# Patient Record
Sex: Female | Born: 1935
Health system: Southern US, Community
[De-identification: ages and names within clinical notes are randomized; demographics above are authoritative.]

## PROBLEM LIST (undated history)

## (undated) DIAGNOSIS — E7800 Pure hypercholesterolemia, unspecified: Secondary | ICD-10-CM

## (undated) DIAGNOSIS — I10 Essential (primary) hypertension: Secondary | ICD-10-CM

## (undated) DIAGNOSIS — E119 Type 2 diabetes mellitus without complications: Secondary | ICD-10-CM

## (undated) DIAGNOSIS — E079 Disorder of thyroid, unspecified: Secondary | ICD-10-CM

## (undated) HISTORY — DX: Disorder of thyroid, unspecified: E07.9

## (undated) HISTORY — PX: SMALL INTESTINE SURGERY: SHX150

## (undated) HISTORY — PX: ABDOMINAL HYSTERECTOMY: SHX81

## (undated) HISTORY — PX: KNEE SURGERY: SHX244

---

## 1998-08-03 ENCOUNTER — Ambulatory Visit (HOSPITAL_COMMUNITY): Admission: RE | Admit: 1998-08-03 | Discharge: 1998-08-03 | Payer: Self-pay | Admitting: Nephrology

## 1998-08-03 ENCOUNTER — Encounter: Payer: Self-pay | Admitting: Nephrology

## 1999-12-07 ENCOUNTER — Encounter: Admission: RE | Admit: 1999-12-07 | Discharge: 1999-12-07 | Payer: Self-pay | Admitting: Gynecology

## 1999-12-07 ENCOUNTER — Encounter: Payer: Self-pay | Admitting: Gynecology

## 2000-06-26 ENCOUNTER — Other Ambulatory Visit: Admission: RE | Admit: 2000-06-26 | Discharge: 2000-06-26 | Payer: Self-pay | Admitting: Gynecology

## 2000-12-01 ENCOUNTER — Encounter: Payer: Self-pay | Admitting: *Deleted

## 2000-12-01 ENCOUNTER — Encounter: Admission: RE | Admit: 2000-12-01 | Discharge: 2000-12-01 | Payer: Self-pay | Admitting: *Deleted

## 2000-12-13 ENCOUNTER — Encounter: Payer: Self-pay | Admitting: Gynecology

## 2000-12-13 ENCOUNTER — Encounter: Admission: RE | Admit: 2000-12-13 | Discharge: 2000-12-13 | Payer: Self-pay | Admitting: Gynecology

## 2001-01-19 ENCOUNTER — Encounter (INDEPENDENT_AMBULATORY_CARE_PROVIDER_SITE_OTHER): Payer: Self-pay | Admitting: Specialist

## 2001-01-19 ENCOUNTER — Ambulatory Visit (HOSPITAL_COMMUNITY): Admission: RE | Admit: 2001-01-19 | Discharge: 2001-01-19 | Payer: Self-pay | Admitting: *Deleted

## 2001-12-24 ENCOUNTER — Encounter: Admission: RE | Admit: 2001-12-24 | Discharge: 2001-12-24 | Payer: Self-pay | Admitting: Gynecology

## 2001-12-24 ENCOUNTER — Encounter: Payer: Self-pay | Admitting: Gynecology

## 2002-10-25 ENCOUNTER — Ambulatory Visit (HOSPITAL_COMMUNITY): Admission: RE | Admit: 2002-10-25 | Discharge: 2002-10-25 | Payer: Self-pay | Admitting: Gastroenterology

## 2003-01-09 ENCOUNTER — Encounter: Payer: Self-pay | Admitting: Family Medicine

## 2003-01-09 ENCOUNTER — Encounter: Admission: RE | Admit: 2003-01-09 | Discharge: 2003-01-09 | Payer: Self-pay | Admitting: Family Medicine

## 2003-11-17 ENCOUNTER — Emergency Department (HOSPITAL_COMMUNITY): Admission: EM | Admit: 2003-11-17 | Discharge: 2003-11-17 | Payer: Self-pay | Admitting: *Deleted

## 2004-03-01 ENCOUNTER — Encounter: Admission: RE | Admit: 2004-03-01 | Discharge: 2004-03-01 | Payer: Self-pay | Admitting: Gynecology

## 2004-08-06 ENCOUNTER — Other Ambulatory Visit: Admission: RE | Admit: 2004-08-06 | Discharge: 2004-08-06 | Payer: Self-pay | Admitting: Gynecology

## 2005-02-07 ENCOUNTER — Other Ambulatory Visit: Admission: RE | Admit: 2005-02-07 | Discharge: 2005-02-07 | Payer: Self-pay | Admitting: Gynecology

## 2005-04-15 ENCOUNTER — Encounter: Admission: RE | Admit: 2005-04-15 | Discharge: 2005-04-15 | Payer: Self-pay | Admitting: Gynecology

## 2005-08-18 ENCOUNTER — Other Ambulatory Visit: Admission: RE | Admit: 2005-08-18 | Discharge: 2005-08-18 | Payer: Self-pay | Admitting: Obstetrics and Gynecology

## 2006-04-26 ENCOUNTER — Encounter: Admission: RE | Admit: 2006-04-26 | Discharge: 2006-04-26 | Payer: Self-pay | Admitting: Gynecology

## 2007-05-17 ENCOUNTER — Encounter: Admission: RE | Admit: 2007-05-17 | Discharge: 2007-05-17 | Payer: Self-pay | Admitting: Gynecology

## 2007-10-31 ENCOUNTER — Inpatient Hospital Stay (HOSPITAL_COMMUNITY): Admission: EM | Admit: 2007-10-31 | Discharge: 2007-11-01 | Payer: Self-pay | Admitting: Emergency Medicine

## 2007-10-31 ENCOUNTER — Ambulatory Visit: Payer: Self-pay | Admitting: Cardiology

## 2007-11-01 ENCOUNTER — Ambulatory Visit: Payer: Self-pay

## 2007-11-08 ENCOUNTER — Ambulatory Visit: Payer: Self-pay | Admitting: Cardiovascular Disease

## 2007-11-08 LAB — CONVERTED CEMR LAB
BUN: 10 mg/dL (ref 6–23)
CO2: 29 meq/L (ref 19–32)
Calcium: 9.3 mg/dL (ref 8.4–10.5)
Creatinine, Ser: 0.9 mg/dL (ref 0.4–1.2)
GFR calc Af Amer: 80 mL/min
GFR calc non Af Amer: 66 mL/min

## 2008-05-11 ENCOUNTER — Encounter: Admission: RE | Admit: 2008-05-11 | Discharge: 2008-05-11 | Payer: Self-pay | Admitting: Family Medicine

## 2008-05-28 ENCOUNTER — Encounter: Admission: RE | Admit: 2008-05-28 | Discharge: 2008-05-28 | Payer: Self-pay | Admitting: Gynecology

## 2009-06-30 ENCOUNTER — Encounter
Admission: RE | Admit: 2009-06-30 | Discharge: 2009-06-30 | Payer: Self-pay | Source: Home / Self Care | Admitting: Gynecology

## 2010-06-03 ENCOUNTER — Emergency Department (HOSPITAL_COMMUNITY)
Admission: EM | Admit: 2010-06-03 | Discharge: 2010-06-03 | Payer: Self-pay | Source: Home / Self Care | Admitting: Emergency Medicine

## 2010-06-03 LAB — BASIC METABOLIC PANEL
BUN: 7 mg/dL (ref 6–23)
Calcium: 10 mg/dL (ref 8.4–10.5)
Chloride: 105 mEq/L (ref 96–112)
Creatinine, Ser: 0.92 mg/dL (ref 0.4–1.2)
Glucose, Bld: 139 mg/dL — ABNORMAL HIGH (ref 70–99)
Potassium: 4 mEq/L (ref 3.5–5.1)
Sodium: 140 mEq/L (ref 135–145)

## 2010-06-03 LAB — CBC: Platelets: 199 10*3/uL (ref 150–400)

## 2010-06-03 LAB — DIFFERENTIAL
Basophils Absolute: 0 10*3/uL (ref 0.0–0.1)
Eosinophils Absolute: 0.2 10*3/uL (ref 0.0–0.7)
Lymphocytes Relative: 11 % — ABNORMAL LOW (ref 12–46)
Lymphs Abs: 0.8 10*3/uL (ref 0.7–4.0)
Neutrophils Relative %: 80 % — ABNORMAL HIGH (ref 43–77)

## 2010-06-17 ENCOUNTER — Other Ambulatory Visit: Payer: Self-pay | Admitting: Gynecology

## 2010-06-17 DIAGNOSIS — Z1239 Encounter for other screening for malignant neoplasm of breast: Secondary | ICD-10-CM

## 2010-07-06 ENCOUNTER — Ambulatory Visit
Admission: RE | Admit: 2010-07-06 | Discharge: 2010-07-06 | Disposition: A | Discharge status: Home / Self Care | Payer: MEDICARE | Source: Ambulatory Visit | Attending: Gynecology | Admitting: Gynecology

## 2010-07-06 DIAGNOSIS — Z1239 Encounter for other screening for malignant neoplasm of breast: Secondary | ICD-10-CM

## 2010-09-21 NOTE — Discharge Summary (Signed)
Gloria Murphy, Gloria Murphy               ACCOUNT NO.:  [id number]   MEDICAL RECORD NO.:  [id number]          PATIENT TYPE:  INP   LOCATION:  4741                         FACILITY:  [facility]   PHYSICIAN:  Veverly Fells. Excell Seltzer, MD  DATE OF BIRTH:  1935-08-10   DATE OF ADMISSION:  10/31/2007  DATE OF DISCHARGE:  11/01/2007                               DISCHARGE SUMMARY   PRIMARY CARDIOLOGIST:  Rollene Rotunda, MD, [facility]   PRIMARY CARE Evah Rashid:  Renaye Rakers, MD   DISCHARGE DIAGNOSIS:  Chest pain.   SECONDARY DIAGNOSES:  1. Hypertension x15 years.  2. Hyperlipidemia x3 years.  3. Diabetes mellitus x3 years.  4. Iron-deficiency anemia.  5. Gastroesophageal reflux disease.  6. Obesity.   ALLERGIES:  No known drug allergies.   PROCEDURES:  None.   HISTORY OF PRESENT ILLNESS:  This is a 75 year old African American  female with no prior cardiac history, although with multiple risk  factors and family history.  She was in her usual state of health until  October 31, 2007 when she had sudden onset chest discomfort occurring under  her right breast unrelieved with Tylenol.  She eventually presented to  the [facility] ED where her pain slowly eased off after receiving  aspirin, sublingual nitroglycerin, morphine, and nitroglycerin infusion.  Symptoms were felt to be somewhat atypical and the patient was admitted  for a rule out.   HOSPITAL COURSE:  Cardiac markers have remained negative x3.  ECG has  shown no acute changes.  Ms. Luciana Axe has had no additional chest  discomfort.  We will plan to discharge her home today and we have  arranged for a followup adenosine Myoview in our office this afternoon  at 12 noon.   DISCHARGE LABORATORY DATA:  Hemoglobin 12.1, hematocrit 34.7, WBC 5.7,  platelets 172, MCV 87.8.  Sodium 139, potassium 3.6, chloride 104, CO2  28, BUN 9, creatinine 1.05, glucose 160.  Total bilirubin 0.9, alkaline  phosphatase 70, AST 26, ALT 10, lipase 23, albumin 3.5.  Cardiac  markers  negative x3.  Total cholesterol 112, triglycerides 51, HDL 42, LDL 60,  calcium 9.2, hemoglobin A1c 5.8.  TSH 2.800.  Fecal occult blood was  negative.   DISPOSITION:  The patient is being discharged home today in good  condition.   FOLLOWUP PLANS AND APPOINTMENTS:  As noted above, she has a adenosine  Myoview scheduled for this afternoon at 12 noon.  If this is negative,  we would simply recommend primary care followup with Dr. Parke Simmers.  Otherwise, we will arrange cardiology followup with Dr. Antoine Poche.   DISCHARGE MEDICATIONS:  1. Aspirin 81 mg daily.  2. Benicar hydrochlorothiazide 40/25 mg daily (new dose).  3. K-Dur 20 mEq daily.  4. Lipitor 20 mg daily.  5. Janumet 50/500 mg b.i.d.  6. Caltrate Plus D one daily.  7. Iron daily.  8. Nitroglycerin 0.4 mg sublingual p.r.n. chest pain.   Outstanding lab studies none.  The patient is scheduled for Myoview as  above and should have followup BMET in 1 week for hypokalemia with Dr.  Parke Simmers.  Duration of discharge  encounter 45 minutes including physician  time.      Nicolasa Ducking, ANP      Veverly Fells. Excell Seltzer, MD  Electronically Signed    CB/MEDQ  D:  11/01/2007  T:  11/02/2007  Job:  [id number]   cc:   Renaye Rakers, M.D.

## 2010-09-21 NOTE — H&P (Signed)
[name], Murphy NO.:  [id number]   MEDICAL RECORD NO.:  [id number]          PATIENT TYPE:  INP   LOCATION:  4741                         FACILITY:  [facility]   PHYSICIAN:  Rollene Rotunda, MD, FACCDATE OF BIRTH:  April 16, 1936   DATE OF ADMISSION:  10/31/2007  DATE OF DISCHARGE:                              HISTORY & PHYSICAL   PRIMARY CARE PHYSICIAN:  Renaye Rakers, MD.   PRIMARY CARDIOLOGIST:  Rollene Rotunda, MD.   CHIEF COMPLAINT:  Chest pain.   HISTORY OF PRESENT ILLNESS:  Gloria Murphy is a 75 year old African American  female with no previous history of coronary artery disease.  She had  sudden onset of right-sided chest pain that started in the area of her  chest under her right breast.  It started with minimal activity at  approximately 8 a.m. today.  Initially, it was 10/10.  She describes it  as a pressure and took Tylenol.  When her symptoms did not resolve, she  came to the emergency room at about 11 a.m.  She was given aspirin 81 mg  x4 as well as sublingual nitroglycerin x3 and 2 mg of Morphine.  She was  started on a nitroglycerin drip at 5 mcg per minute.  She describes her  pain as a 2 or 3/10 at this time.  She feels the nitroglycerin and the  aspirin did help as well as Morphine.  She has never had any symptoms  like this before.  She feels that she stays fairly active around the  house and doing yard work and never gets any chest pain.  She has some  orthopedic issues, but up until March was doing water aerobics also  without symptoms.  The pain radiated around to her back and was  associated with nausea, but she denies shortness of breath or  diaphoresis.  She also feels that she has had some dyspnea on exertion  recently that is new.  Currently, although she still does complain of  some minor pain, she is resting comfortably.   PAST MEDICAL HISTORY:  1. Diabetes for 3 years.  2. Hypertension for 15 years.  3. Hyperlipidemia for greater than 3  years.   FAMILY HISTORY:  1. Premature coronary artery disease.  2. History of iron deficiency anemia.  3. History of gastroesophageal reflux disease.  4. Osteoarthritis.   PAST SURGICAL HISTORY:  She has status post partial hysterectomy as well  as colonoscopy and EGD x2.   ALLERGIES:  No known drug allergies.   CURRENT MEDICATIONS:  1. Benicar HCT 20/12.5 daily.  2. Lipitor 10 mg a day.  3. Janumet 50/500, 1/2 tab b.i.d.  4. Caltrate plus D and OTC iron daily.   SOCIAL HISTORY:  She lives in [place] alone and is retired from  [employer].  She has no history of alcohol, tobacco, or drug abuse.  She  feels that she eats a pretty heart-healthy diet.  She tries to be  compliant with a diabetic diet as well.   FAMILY HISTORY:  Her mother died in her 20s of diabetes, but was  diagnosed with heart disease her 92s.  Her father died at age 48 of CVA,  but no history of coronary artery disease.  She has 1 of 5 siblings that  had a stroke, but none of them have heart disease.   REVIEW OF SYSTEMS:  She has had some sweats as well as night sweats, but  no fevers or chills.  The chest pain is described above.  The dyspnea on  exertion that she feels is a little worse recently.  She feels that she  has some occasional issues with anxiety.  She has had pain in her left  shoulder from a frozen shoulder, which has gotten better with steroid  injections and physical therapy and she also has some other arthralgias  that she feels are from her arthritis.  She had nausea today and feels  that she has occasional reflux symptoms, but says that they never get  bad enough to where she has to take any medication.  She denies  hematemesis, hemoptysis, or melena.  Full 14-point review of systems is  otherwise negative.   PHYSICAL EXAMINATION:  VITAL SIGNS:  Temperature is 97.9; blood pressure  initially 176/86, now 130/80; pulse 87; respiratory rate 16; O2  saturation 97% on room air.  GENERAL:   She is a well-developed [place] female in no acute  distress.  HEENT:  Normal.  NECK:  There is no lymphadenopathy, thyromegaly, bruits, or JVD noted.  CV:  Her heart is regular in rate and rhythm with S1 and S2 and no  significant murmur, rub, or gallop is noted.  Distal pulses are intact  in all 4 extremities and no femoral bruits are appropriated.  LUNGS:  Clear to auscultation bilaterally without wheezing or crackles.  SKIN:  No rashes or lesions are noted.  ABDOMEN:  Soft and nontender with active bowel sounds and no  hepatosplenomegaly.  EXTREMITIES:  There is no cyanosis, clubbing, or edema noted.  MUSCULOSKELETAL:  There is no joint deformity or effusions.  No spine or  CVA tenderness.  Of note, her chest wall is also nontender to palpation.  NEUROLOGIC:  She is alert and oriented.  Cranial nerves II through XII  are grossly intact.   Chest x-ray, no acute disease.   EKG sinus rhythm at rate 75 with diffuse inferolateral ST changes that  are minimally different from an EKG from 2006.   LABORATORY DATA:  Hemoglobin 12.5, hematocrit 35.8, WBC is 4.9, and  platelets 187,000.  Sodium 139, potassium 3.6, chloride 104, CO2 28, BUN  9, creatinine 1.05, and glucose 160.  Initial point-of-care markers, CK-  MB, and troponin are negative.  INR 0.9.  Fecal occult blood negative  x1.  Total cholesterol 112, triglycerides 51, HDL 42, and LDL 60.  TSH  and hemoglobin A1c are pending.   IMPRESSION:  Chest pain:  She has right-sided chest pain that she  described as heaviness.  She had some improvement with sublingual  nitroglycerin and IV nitroglycerin.  There are no clear associated  symptoms and enzymes are negative.  She has nonspecific ST and T wave  changes.  She has more atypical than typical features.  She would be  admitted to rule out myocardial infarction.  We will continue the IV  nitroglycerin and add heparin.  We will check her EKG in the morning.  If her cardiac  enzymes remain negative, an outpatient Myoview was  appropriate for evaluation.  We will add a proton pump inhibitor as well  as p.r.n. pain medication to treat symptoms.  Further evaluation and  treatment will depend on the results of the above testing.  An exercise  Myoview has been tentatively scheduled in our office at noon tomorrow.      Theodore Demark, PA-C      Rollene Rotunda, MD, [facility]  Electronically Signed    RB/MEDQ  D:  10/31/2007  T:  11/01/2007  Job:  [id number]   cc:   Renaye Rakers, M.D.

## 2010-09-24 NOTE — Procedures (Signed)
[facility]  Patient:    Gloria Murphy, Gloria Murphy Visit Number: [id number] MRN: [id number]          Service Type: Attending:  Sabino Gasser, M.D. Dictated by:   Sabino Gasser, M.D. Proc. Date: 01/19/01   CC:         Roderic Ovens. Ambrose Mantle, M.D., [facility], [address]   Procedure Report  PROCEDURE PERFORMED:  Colonoscopy  INDICATIONS:  Rectal bleeding, cancer screening.  ANESTHESIA:  Demerol 30, Versed 3 mg  DESCRIPTION OF PROCEDURE:  With the patient mildly sedated in the left lateral decubitus position, the Olympus videoscopic colonoscope was inserted into the rectum and advanced under direct vision to the cecum identified by ileocecal valve and appendiceal orifice both of which were photographed.  From this point, the colonoscope was slowly withdrawn taking circumferential views of the entire colonic mucosa, stopping only in the rectum which appeared normal on direct.  Retroflexed view showed hemorrhoids.  The endoscope was straightened and withdrawn.  The patients vital signs and pulse oximetry remained stable.  The patient tolerated procedure well without apparent complications.  FINDINGS:  Internal hemorrhoids, otherwise unremarkable colonoscopic examination to the cecum.  PLAN:  See endoscopy note for further details. Dictated by:   Sabino Gasser, M.D. Attending:  Sabino Gasser, M.D. DD:  01/19/01 TD:  01/19/01 Job: [id number]

## 2010-09-24 NOTE — Procedures (Signed)
[facility]  Patient:    Gloria Murphy, Gloria Murphy Visit Number: [id number] MRN: [id number]          Service Type: END Location: ENDO Attending Physician:  Sabino Gasser Proc. Date: 01/19/01 Admit Date:  01/19/2001   CC:         Eula Listen, M.D., [facility]   Procedure Report  PROCEDURE:  Upper endoscopy with biopsy.  SURGEON:  Sabino Gasser, M.D.  INDICATIONS:  GERD.  ANESTHESIA:  Demerol 50 and Versed 5 mg.  DESCRIPTION OF PROCEDURE:  With the patient mildly sedated in the left lateral decubitus position, the Olympus videoscopic endoscope was inserted into the mouth and passed under direct vision through the esophagus which appeared normal.  We entered into the stomach.  The fundus, body, antrum, duodenal bulb, and second portion of the duodenum were visualized.  Photographs taken. From this point, the endoscope was slowly withdrawn, taking circumferential views of the entire duodenal mucosa until the endoscope had been pulled back in the stomach and placed in retroflexion to view the stomach from below.  The endoscope was then straightened and withdrawn, taking circumferential views of the remaining gastric and esophageal mucosa, stopping in the body of the stomach where there was some erythema seen consistent with a possible gastritis.  It was photographed and biopsied.  The endoscope was withdrawn.  The patients vital signs and pulse oximeter remained stable.  The patient tolerated the procedure well and without apparent complication.  FINDINGS:  Question of gastritis in body of stomach, await biopsy report.  The patient will call me for results and follow up with me as an outpatient. Proceed to colonoscopy as planned. Attending Physician:  Sabino Gasser DD:  01/19/01 TD:  01/19/01 Job: [id number]

## 2010-09-24 NOTE — Op Note (Signed)
   Gloria Murphy, Gloria Murphy                         ACCOUNT NO.:  [id number]   MEDICAL RECORD NO.:  [id number]                   PATIENT TYPE:  AMB   LOCATION:  ENDO                                 FACILITY:  [facility]   PHYSICIAN:  Anselmo Rod, M.D.               DATE OF BIRTH:  [birth year]   DATE OF PROCEDURE:  10/25/2002  DATE OF DISCHARGE:  10/25/2002                                 OPERATIVE REPORT   PROCEDURE PERFORMED:  Esophagogastroduodenoscopy.   ENDOSCOPIST:  Charna Elizabeth, M.D.   INSTRUMENT USED:  Olympus video panendoscope.   INDICATIONS FOR PROCEDURE:  [age]-year-old African American female with  a history of iron deficiency anemia.  Rule out peptic ulcer disease,  esophagitis, gastritis, etc.   PROCEDURE PREPARATION:  Informed consent was procured from the patient.  The  patient was fasted for eight hours prior to the procedure and prepped with a  bottle of magnesium citrate and a gallon of GOLYTELY the night prior to the  procedure.   PREPROCEDURE PHYSICAL EXAMINATION:  VITAL SIGNS:  The patient has stable  vital signs.  NECK:  Supple.  CHEST:  Clear to auscultation.  HEART:  S1 and S2 regular.  ABDOMEN:  Soft with normal bowel sounds.   DESCRIPTION OF PROCEDURE:  The patient was placed in the left lateral  decubitus position and sedated with 60 mg of Demerol and 6 mg of Versed  intravenously.  Once the patient was adequately sedated and maintained on  low flow oxygen and continuous cardiac monitoring, the Olympus video  panendoscope was advanced through the mouthpiece, over the tongue, into the  esophagus under direct vision.  The entire esophagus appeared normal with no  evidence of ring, stricture, masses, esophagitis, or Barrett's mucosa.  The  scope was then advanced into the stomach.  Retroflexion into the high cardia  revealed no abnormalities.  The entire gastric mucosa and the proximal small  bowel appeared normal.   IMPRESSION:  Normal  esophagogastroduodenoscopy.    RECOMMENDATIONS:  1. Proceed with colonoscopy at this time.  2. Further recommendations made after colonoscopy.                                               Anselmo Rod, M.D.    JNM/MEDQ  D:  10/25/2002  T:  10/28/2002  Job:  [id number]   cc:   Renaye Rakers, M.D.  [phone] [address]  [place]  [place] [address]  Fax: [phone]

## 2010-09-24 NOTE — Op Note (Signed)
[name], Gloria Murphy                         ACCOUNT NO.:  [id number]   MEDICAL RECORD NO.:  [id number]                   PATIENT TYPE:  AMB   LOCATION:  ENDO                                 FACILITY:  [facility]   PHYSICIAN:  Anselmo Rod, M.D.               DATE OF BIRTH:  06-Sep-1935   DATE OF PROCEDURE:  DATE OF DISCHARGE:  10/25/2002                                 OPERATIVE REPORT   PROCEDURE:  Screening colonoscopy.   ENDOSCOPIST:  Charna Elizabeth, M.D.   INSTRUMENT USED:  Olympus video colonoscope.   INDICATIONS FOR PROCEDURE:  Iron deficiency anemia in a 75 year old [place]  American female with a normal EGD.  Rule out colonic polyps, masses,  hemorrhoids, etc.   PROCEDURE PERFORMED:  Informed consent was procured from the patient.  The  patient fasted for eight hours prior to the procedure and prepped with a  bottle of magnesium citrate and a gallon of GOLYTELY the night prior to the  procedure.   PREPROCEDURE PHYSICAL EXAMINATION:  VITAL SIGNS:  The patient had stable  vital signs.  NECK:  Supple.  CHEST:  Clear to auscultation.  HEART:  S1 and S2 regular.  ABDOMEN:  Soft with normal bowel sounds.   DESCRIPTION OF PROCEDURE:  The patient was placed in the left lateral  decubitus position, sedated with an additional 10 mg of Demerol and 1 mg of  Versed intravenously.  Once the patient was adequately sedated and  maintained on low flow oxygen and continuous cardiac monitoring, the Olympus  video colonoscope was advanced from the rectum to the cecum and terminal  ileum without difficulty.  There was some residual stool in the colon.  Small internal hemorrhoids were seen on retroflexion in the rectum.  The  colonic mucosa up to the cecum appeared normal.  The appendiceal orifice and  ileocecal valve were clearly visualized and photographed.  As the prep was  somewhat inadequate small lesions could have been missed.  There was no  evidence of diverticulosis.  No masses  or polyps were seen.   IMPRESSION:  1. Essentially unrevealing colonoscopy except for small internal     hemorrhoids.  2. Some residual stool in the colon.  Small lesions could have been missed.    RECOMMENDATIONS:  1. Outpatient followup is advised in the next 2 weeks, earlier if need be.  2. Serial CBCs will be done on iron supplementation and continue if     necessary.                                               Anselmo Rod, M.D.    JNM/MEDQ  D:  10/25/2002  T:  10/28/2002  Job:  [id number]   cc:   Renaye Rakers, M.D.  [phone] [address].  [address]  [place]  [place] [address]  Fax: [phone]

## 2011-02-03 LAB — CK TOTAL AND CKMB (NOT AT ARMC)
CK, MB: 0.8
Relative Index: INVALID
Total CK: 54

## 2011-02-03 LAB — COMPREHENSIVE METABOLIC PANEL
AST: 26
BUN: 9
GFR calc Af Amer: >60
GFR calc non Af Amer: 52 — ABNORMAL LOW
Total Bilirubin: 0.9

## 2011-02-03 LAB — LIPID PANEL
LDL Cholesterol: 60
VLDL: 10

## 2011-02-03 LAB — CBC
HCT: 35.8 — ABNORMAL LOW
Hemoglobin: 12.1
MCHC: 34.8
MCV: 88
Platelets: 172
RDW: 12.9
WBC: 4.9
WBC: 5.7

## 2011-02-03 LAB — CARDIAC PANEL(CRET KIN+CKTOT+MB+TROPI)
CK, MB: 1.1
CK, MB: 1.5
Total CK: 140
Total CK: 95
Troponin I: 0.02
Troponin I: 0.03

## 2011-02-03 LAB — APTT: aPTT: 27

## 2011-02-03 LAB — DIFFERENTIAL
Basophils Absolute: 0
Monocytes Relative: 7
Neutro Abs: 3.7

## 2011-02-03 LAB — PROTIME-INR: INR: 0.9

## 2011-02-03 LAB — HEMOGLOBIN A1C
Hgb A1c MFr Bld: 5.8
Mean Plasma Glucose: 129

## 2011-02-03 LAB — OCCULT BLOOD X 1 CARD TO LAB, STOOL: Fecal Occult Bld: NEGATIVE

## 2011-02-03 LAB — POCT CARDIAC MARKERS: Operator id: 146091

## 2011-02-03 LAB — LIPASE, BLOOD: Lipase: 23

## 2011-02-03 LAB — TSH: TSH: 2.8

## 2011-07-13 ENCOUNTER — Other Ambulatory Visit: Payer: Self-pay | Admitting: Gynecology

## 2011-07-13 DIAGNOSIS — Z1231 Encounter for screening mammogram for malignant neoplasm of breast: Secondary | ICD-10-CM

## 2011-08-11 ENCOUNTER — Ambulatory Visit
Admission: RE | Admit: 2011-08-11 | Discharge: 2011-08-11 | Disposition: A | Discharge status: Home / Self Care | Payer: Medicare Other | Source: Ambulatory Visit | Attending: Gynecology | Admitting: Gynecology

## 2011-08-11 DIAGNOSIS — Z1231 Encounter for screening mammogram for malignant neoplasm of breast: Secondary | ICD-10-CM

## 2012-06-30 ENCOUNTER — Encounter (HOSPITAL_COMMUNITY): Payer: Self-pay | Admitting: Emergency Medicine

## 2012-06-30 ENCOUNTER — Emergency Department (HOSPITAL_COMMUNITY)
Admission: EM | Admit: 2012-06-30 | Discharge: 2012-06-30 | Disposition: A | Discharge status: Home / Self Care | Payer: Medicare Other | Attending: Emergency Medicine | Admitting: Emergency Medicine

## 2012-06-30 ENCOUNTER — Emergency Department (HOSPITAL_COMMUNITY): Payer: Medicare Other

## 2012-06-30 DIAGNOSIS — Y929 Unspecified place or not applicable: Secondary | ICD-10-CM | POA: Insufficient documentation

## 2012-06-30 DIAGNOSIS — Y9389 Activity, other specified: Secondary | ICD-10-CM | POA: Insufficient documentation

## 2012-06-30 DIAGNOSIS — Z862 Personal history of diseases of the blood and blood-forming organs and certain disorders involving the immune mechanism: Secondary | ICD-10-CM | POA: Insufficient documentation

## 2012-06-30 DIAGNOSIS — I10 Essential (primary) hypertension: Secondary | ICD-10-CM | POA: Insufficient documentation

## 2012-06-30 DIAGNOSIS — M47817 Spondylosis without myelopathy or radiculopathy, lumbosacral region: Secondary | ICD-10-CM | POA: Insufficient documentation

## 2012-06-30 DIAGNOSIS — E119 Type 2 diabetes mellitus without complications: Secondary | ICD-10-CM | POA: Insufficient documentation

## 2012-06-30 DIAGNOSIS — Z8639 Personal history of other endocrine, nutritional and metabolic disease: Secondary | ICD-10-CM | POA: Insufficient documentation

## 2012-06-30 DIAGNOSIS — W1809XA Striking against other object with subsequent fall, initial encounter: Secondary | ICD-10-CM | POA: Insufficient documentation

## 2012-06-30 HISTORY — DX: Type 2 diabetes mellitus without complications: E11.9

## 2012-06-30 HISTORY — DX: Pure hypercholesterolemia, unspecified: E78.00

## 2012-06-30 HISTORY — DX: Essential (primary) hypertension: I10

## 2012-06-30 MED ORDER — METHOCARBAMOL 500 MG PO TABS: 500.0000 mg | ORAL_TABLET | Freq: Two times a day (BID) | ORAL | Status: DC

## 2012-06-30 MED ORDER — KETOROLAC TROMETHAMINE 60 MG/2ML IM SOLN
60.0000 mg | Freq: Once | INTRAMUSCULAR | Status: AC
  Administered 2012-06-30: 60 mg via INTRAMUSCULAR
  Filled 2012-06-30: qty 2

## 2012-06-30 MED ORDER — TRAMADOL HCL 50 MG PO TABS: 50.0000 mg | ORAL_TABLET | Freq: Four times a day (QID) | ORAL | Status: DC | PRN

## 2012-06-30 NOTE — ED Notes (Signed)
Pt st's she fell into a computer chair 4 days ago and left hip hit the arm of chair.  Pt st's today when she woke up hip was very painful.

## 2012-06-30 NOTE — ED Provider Notes (Signed)
History  This chart was scribed for Virgina Evener practitioner working with Carleene Cooper III, MD by Bennett Scrape, ED Scribe. This patient was seen in room TR09C/TR09C and the patient's care was started at 6:05 PM.  CSN: [id number]  Arrival date & time 06/30/12  1658   First MD Initiated Contact with Patient 06/30/12 1805      Chief Complaint  Patient presents with  . Hip Pain     The history is provided by the patient. No language interpreter was used.    Gloria Murphy is a 77 y.o. female who presents to the Emergency Department for chief complaint of sudden onset, gradually worsening, constant left posterior hip pain that started this morning upon waking. She reports a fall during which she hit her left hip on the arm of a computer chair 4 days ago but denies immediate pain. She reports that the pain is worse with walking and she denies having prior episodes of similar symptoms. She denies fever, neck pain, sore throat, visual disturbance, CP, cough, SOB, abdominal pain, nausea, emesis, diarrhea, urinary symptoms, back pain, HA, weakness, numbness and rash as associated symptoms. She has a h/o DM, HTN and HLD and denies smoking and alcohol use.  Dr. Parke Simmers is PCP.  Past Medical History  Diagnosis Date  . Diabetes mellitus without complication   . Hypertension   . High cholesterol     Past Surgical History  Procedure Laterality Date  . Abdominal hysterectomy      No family history on file.  History  Substance Use Topics  . Smoking status: Never Smoker   . Smokeless tobacco: Not on file  . Alcohol Use: No    No OB history provided.  Review of Systems  A complete 10 system review of systems was obtained and all systems are negative except as noted in the HPI and PMH.   Allergies  Review of patient's allergies indicates not on file.  Home Medications  No current outpatient prescriptions on file.  Triage Vitals: BP 170/69  Pulse 99  Temp(Src)  97.9 F (36.6 C) (Oral)  Resp 20  SpO2 99%  Physical Exam  Nursing note and vitals reviewed. Constitutional: She is oriented to person, place, and time. She appears well-developed and well-nourished. No distress.  HENT:  Head: Normocephalic and atraumatic.  Eyes: Conjunctivae and EOM are normal.  Neck: Normal range of motion. No tracheal deviation present.  Cardiovascular: Normal rate.   Pulmonary/Chest: Effort normal.  Musculoskeletal: Normal range of motion. She exhibits tenderness.  Tenderness to palpation over the left SI joint, associated muscle tightness in the surrounding area  Neurological: She is alert and oriented to person, place, and time. She has normal strength. No sensory deficit. She exhibits normal muscle tone. She displays no seizure activity.  Skin: Skin is warm and dry.  Psychiatric: She has a normal mood and affect.    ED Course  Procedures (including critical care time)  DIAGNOSTIC STUDIES: Oxygen Saturation is 99% on room air, normal by my interpretation.    COORDINATION OF CARE: 6:45 PM-Informed pt of radiology results. Discussed discharge plan which includes ice, muscle relaxer and pain medication with pt and pt agreed to plan. Also advised pt to walk to help the symptoms and to follow up with PCP and pt agreed.   Labs Reviewed - No data to display Dg Hip Complete Left  06/30/2012  *RADIOLOGY REPORT*  Clinical Data: Larey Seat several days ago.  Pain.  LEFT HIP - COMPLETE 2+  VIEW  Comparison: None.  Findings: No evidence of hip or pelvic fracture.  The patient does have degenerative change at the symphysis pubis at the left sacroiliac joint.  IMPRESSION: No acute fracture.  No hip injury identified.  Arthritis of the left sacroiliac joint and osteitis pubis.   Original Report Authenticated By: Paulina Fusi, M.D.      1. SI joint arthritis       MDM  Muscle spasm vs arthritis.  No bony abnormality.  No cauda equina.      I personally performed the  services described in this documentation, which was scribed in my presence. The recorded information has been reviewed and is accurate.     Roxy Horseman, PA-C 06/30/12 1919

## 2012-07-01 NOTE — ED Provider Notes (Signed)
Medical screening examination/treatment/procedure(s) were performed by non-physician practitioner and as supervising physician I was immediately available for consultation/collaboration.   Carleene Cooper III, MD 07/01/12 1323

## 2012-07-03 ENCOUNTER — Ambulatory Visit (INDEPENDENT_AMBULATORY_CARE_PROVIDER_SITE_OTHER): Payer: Medicare Other | Admitting: Family Medicine

## 2012-07-03 VITALS — BP 160/75 | HR 88 | Temp 98.1°F | Resp 18 | Ht 70.0 in | Wt 215.2 lb

## 2012-07-03 LAB — POCT CBC
Granulocyte percent: 69.9 %G (ref 37–80)
MID (cbc): 0.5 (ref 0–0.9)
MPV: 8.4 fL (ref 0–99.8)
POC MID %: 7.8 %M (ref 0–12)
Platelet Count, POC: 240 10*3/uL (ref 142–424)
RBC: 4.94 M/uL (ref 4.04–5.48)

## 2012-07-03 MED ORDER — METAXALONE 800 MG PO TABS: 800.0000 mg | ORAL_TABLET | Freq: Three times a day (TID) | ORAL | Status: DC

## 2012-07-03 MED ORDER — METHYLPREDNISOLONE 4 MG PO KIT: PACK | ORAL | Status: DC

## 2012-07-03 MED ORDER — KETOROLAC TROMETHAMINE 60 MG/2ML IM SOLN
60.0000 mg | Freq: Once | INTRAMUSCULAR | Status: AC
  Administered 2012-07-03: 60 mg via INTRAMUSCULAR

## 2012-07-03 MED ORDER — HYDROCODONE-ACETAMINOPHEN 5-325 MG PO TABS: 1.0000 | ORAL_TABLET | ORAL | Status: DC | PRN

## 2012-07-03 NOTE — Patient Instructions (Signed)
Piriformis Syndrome with Rehab Piriformis syndrome is a condition the affects the nervous system in the area of the hip, and is characterized by pain and possibly a loss of feeling in the backside (posterior) thigh that may extend down the entire length of the leg. The symptoms are caused by an increase in pressure on the sciatic nerve by the piriformis muscle, which is on the back of the hip and is responsible for externally rotating the hip. The sciatic nerve and its branches connect to much of the leg. Normally the sciatic nerve runs between the piriformis muscle and other muscles. However, in certain individuals the nerve runs through the muscle, which causes an increase in pressure on the nerve and results in the symptoms of piriformis syndrome. SYMPTOMS   Pain, tingling, numbness, or burning in the back of the thigh that may also extend down the entire leg.  Occasionally, tenderness in the buttock.  Loss of function of the leg.  Pain that worsens when using the piriformis muscle (running, jumping, or stairs).  Pain that increases with prolonged sitting.  Pain that is lessened by laying flat on the back. CAUSES   Piriformis syndrome is the result of an increase in pressure placed on the sciatic nerve. Often times piriformis syndrome is an overuse injury.  Stress placed on the nerve from a sudden increase in the intensity, frequency, or duration of training.  Compensation of other extremity injuries. RISK INCREASES WITH:  Sports that involve the piriformis muscle (running, walking or jumping).  You are born with (congenital) a defect in which the sciatic nerve passes through the muscle. PREVENTION  Warm up and stretch properly before activity.  Allow for adequate recovery between workouts.  Maintain physical fitness:  Strength, flexibility, and endurance.  Cardiovascular fitness. PROGNOSIS  If treated properly, then the symptoms of piriformis syndrome usually resolve in 2  to 6 weeks. RELATED COMPLICATIONS   Persistent and possibly permanent pain and numbness in the lower extremity.  Weakness of the extremity that may progress to disability and inability to compete. TREATMENT  The most effective treatment for piriformis syndrome is rest from any activities that aggravate the symptoms. Ice and pain medication may help reduce pain and inflammation. The use of strengthening and stretching exercises may help reduce pain with activity. These exercises may be performed at home or with a therapist. A referral to a therapist may be given for further evaluation and treatment, such as ultrasound. Corticosteroid injections may be given to reduce inflammation that is causing pressure to be placed on the sciatic nerve. If non-surgical (conservative) treatment is unsuccessful, then surgery may be recommended.  MEDICATION   If pain medication is necessary, then nonsteroidal anti-inflammatory medications, such as aspirin and ibuprofen, or other minor pain relievers, such as acetaminophen, are often recommended.  Do not take pain medication for 7 days before surgery.  Prescription pain relievers may be given if deemed necessary by your caregiver. Use only as directed and only as much as you need.  Corticosteroid injections may be given by your caregiver. These injections should be reserved for the most serious cases, because they may only be given a certain number of times. HEAT AND COLD:   Cold treatment (icing) relieves pain and reduces inflammation. Cold treatment should be applied for 10 to 15 minutes every 2 to 3 hours for inflammation and pain and immediately after any activity that aggravates your symptoms. Use ice packs or massage the area with a piece of ice (ice massage).    Heat treatment may be used prior to performing the stretching and strengthening activities prescribed by your caregiver, physical therapist, or athletic trainer. Use a heat pack or soak the injury in  warm water. SEEK IMMEDIATE MEDICAL CARE IF:  Treatment seems to offer no benefit, or the condition worsens.  Any medications produce adverse side effects. EXERCISES RANGE OF MOTION (ROM) AND STRETCHING EXERCISES - Piriformis Syndrome These exercises may help you when beginning to rehabilitate your injury. Your symptoms may resolve with or without further involvement from your physician, physical therapist or athletic trainer. While completing these exercises, remember:   Restoring tissue flexibility helps normal motion to return to the joints. This allows healthier, less painful movement and activity.  An effective stretch should be held for at least 30 seconds.  A stretch should never be painful. You should only feel a gentle lengthening or release in the stretched tissue. STRETCH - Hip Rotators  Lie on your back on a firm surface. Grasp your right / left knee with your right / left hand and your ankle with your opposite hand.  Keeping your hips and shoulders firmly planted, gently pull your right / left knee and rotate your lower leg toward your opposite shoulder until you feel a stretch in your buttocks.  Hold this stretch for __________ seconds. Repeat this stretch __________ times. Complete this stretch __________ times per day. STRETCH  Iliotibial Band  On the floor or bed, lie on your side so your right / left leg is on top. Bend your knee and grab your ankle.  Slowly bring your knee back so that your thigh is in line with your trunk. Keep your heel at your buttocks and gently arch your back so your head, shoulders and hips line up.  Slowly lower your leg so that your knee approaches the floor/bed until you feel a gentle stretch on the outside of your right / left thigh. If you do not feel a stretch and your knee will not fall farther, place the heel of your opposite foot on top of your knee and pull your thigh down farther.  Hold this stretch for __________ seconds. Repeat  __________ times. Complete __________ times per day. STRENGTHENING EXERCISES - Piriformis Syndrome  These are some of the caregiver again or until your symptoms are resolved. Remember:   Strong muscles with good endurance tolerate stress better.  Do the exercises as initially prescribed by your caregiver. Progress slowly with each exercise, gradually increasing the number of repetitions and weight used under their guidance. STRENGTH - Hip Abductors, Straight Leg Raises Be aware of your form throughout the entire exercise so that you exercise the correct muscles. Sloppy form means that you are not strengthening the correct muscles.  Lie on your side so that your head, shoulders, knee and hip line up. You may bend your lower knee to help maintain your balance. Your right / left leg should be on top.  Roll your hips slightly forward, so that your hips are stacked directly over each other and your right / left knee is facing forward.  Lift your top leg up 4-6 inches, leading with your heel. Be sure that your foot does not drift forward or that your knee does not roll toward the ceiling.  Hold this position for __________ seconds. You should feel the muscles in your outer hip lifting (you may not notice this until your leg begins to tire).  Slowly lower your leg to the starting position. Allow the muscles to   fully relax before beginning the next repetition. Repeat __________ times. Complete this exercise __________ times per day.  STRENGTH - Hip Abductors, Quadriped  On a firm, lightly padded surface, position yourself on your hands and knees. Your hands should be directly below your shoulders and your knees should be directly below your hips.  Keeping your right / left knee bent, lift your leg out to the side. Keep your legs level and in line with your shoulders.  Position yourself on your hands and knees.  Hold for __________ seconds.  Keeping your trunk steady and your hips level, slowly  lower your leg to the starting position. Repeat __________ times. Complete this exercise __________ times per day.  STRENGTH - Hip Abductors, Standing  Tie one end of a rubber exercise band/tubing to a secure surface (table, pole) and tie a loop at the other end.  Place the loop around your right / left ankle. Keeping your ankle with the band directly opposite of the secured end, step away until there is tension in the tube/band.  Hold onto a chair as needed for balance.  Keeping your back upright, your shoulders over your hips, and your toes pointing forward, lift your right / left leg out to your side. Be sure to lift your leg with your hip muscles. Do not "throw" your leg or tip your body to lift your leg.  Slowly and with control, return to the starting position. Repeat exercise __________ times. Complete this exercise __________ times per day.  Document Released: 04/25/2005 Document Revised: 10/25/2011 Document Reviewed: 08/07/2008 [facility] Patient Information 2013 [place], [place].

## 2012-07-03 NOTE — Progress Notes (Signed)
Subjective:    Patient ID: Gloria Murphy, female    DOB: Oct 05, 1935, 77 y.o.   MRN: [id number] Chief Complaint  Patient presents with  . Hip Pain    left side- went to the ER Saturday- inflamitory arthritis.     HPI See ER note from Sat - pt confirms the history reported in the HPI - tramadol and robaxin were started but didn't help at all. Was given toradol injection in ER which relieved pain for about 6 hrs but then it returned and is worsening.  now pain is radiating down thigh and into knee to the point where she is having trouble walking.  No weakness - difficulty walking is due to severity of pain. No numbness, no changes in bowels or bladder, no f/c. No back pain - pain start in left glut. Has not taken any medications today as the meds for the pain don't help at all and was so distressed with the worsening sxs, she forgot to take her BP medications.  Reports her a.m. cbgs have been in the 90s.  Pt has seen derm about 2 wks ago for the rash on the sides of her head - she has just started using a lotion which she reports should let the plaques flake/peel off and the ulcerated scar is from where a wart was treated during the visit.  Past Medical History  Diagnosis Date  . Diabetes mellitus without complication   . Hypertension   . High cholesterol    Current Outpatient Prescriptions on File Prior to Visit  Medication Sig Dispense Refill  . bimatoprost (LUMIGAN) 0.03 % ophthalmic solution Place 1 drop into both eyes at bedtime.      . Calcium Carbonate (CALTRATE 600 PO) Take 1 tablet by mouth daily.      . ferrous sulfate 325 (65 FE) MG tablet Take 325 mg by mouth daily with breakfast.      . Multiple Vitamin (MULTIVITAMIN) tablet Take 1 tablet by mouth daily.      . Olmesartan-Amlodipine-HCTZ (TRIBENZOR PO) Take 1 tablet by mouth daily.      . Omega-3 Fatty Acids (FISH OIL) 500 MG CAPS Take 1 capsule by mouth daily.      [name] OVER THE COUNTER MEDICATION Take 1 tablet by mouth  daily. Potassium.      . rosuvastatin (CRESTOR) 20 MG tablet Take 20 mg by mouth daily.      . Skin Protectants, Misc. (EUCERIN) cream Apply 1 application topically as needed for dry skin.      [name] traMADol (ULTRAM) 50 MG tablet Take 1 tablet (50 mg total) by mouth every 6 (six) hours as needed for pain.  15 tablet  0   No current facility-administered medications on file prior to visit.   No Known Allergies   Review of Systems  Constitutional: Positive for activity change. Negative for fever, chills and unexpected weight change.  Cardiovascular: Negative for leg swelling.  Gastrointestinal: Negative for diarrhea.  Genitourinary: Negative for urgency, enuresis and difficulty urinating.  Musculoskeletal: Positive for myalgias, arthralgias and gait problem. Negative for back pain and joint swelling.  Skin: Positive for color change and rash.  Neurological: Negative for weakness and numbness.  Hematological: Negative for adenopathy.      BP 160/75  Pulse 88  Temp(Src) 98.1 F (36.7 C) (Oral)  Resp 18  Ht 5\' 10"  (1.778 m)  Wt 215 lb 3.2 oz (97.614 kg)  BMI 30.88 kg/m2  SpO2 98% Objective:   Physical Exam  Constitutional: She is oriented to person, place, and time. She appears well-developed and well-nourished.  HENT:  Head: Normocephalic.  Eyes: Conjunctivae are normal. No scleral icterus.  Neck: Normal range of motion. Neck supple.  Cardiovascular: Normal rate, regular rhythm and normal heart sounds.   Pulmonary/Chest: Effort normal and breath sounds normal. No respiratory distress.  Musculoskeletal: She exhibits no edema.       Right hip: Normal.       Left hip: She exhibits tenderness. She exhibits normal range of motion.       Lumbar back: She exhibits decreased range of motion. She exhibits no tenderness, no bony tenderness, no deformity, no pain and no spasm.  Tenderness to palpation over left gluteus - SI area.  Normal hip ROM but severe left gluteal pain radiating  posteriorly to left knee with straight leg raise on both left and right.  Pain with resisted hip flexion and extension but none with passive internal and external rotation. Strength nml 5/5. No change in sensation.  Neurological: She is alert and oriented to person, place, and time. She has normal strength and normal reflexes. No sensory deficit. She exhibits normal muscle tone. Coordination and gait normal.  Reflex Scores:      Patellar reflexes are 2+ on the right side and 2+ on the left side.      Achilles reflexes are 2+ on the right side and 2+ on the left side. Skin: Skin is warm and dry. Lesion noted. No erythema.  Thick black plaques over bilateral temples with round pink scar with central ulceration on right temple.  Psychiatric: She has a normal mood and affect. Her behavior is normal.          Results for orders placed in visit on 07/03/12  C-REACTIVE PROTEIN      Result Value Range   CRP 0.5  <0.60 mg/dL  POCT SEDIMENTATION RATE      Result Value Range   POCT SED RATE 44 (*) 0 - 22 mm/hr  POCT CBC      Result Value Range   WBC 6.1  4.6 - 10.2 K/uL   Lymph, poc 1.4  0.6 - 3.4   POC LYMPH PERCENT 22.3  10 - 50 %L   MID (cbc) 0.5  0 - 0.9   POC MID % 7.8  0 - 12 %M   POC Granulocyte 4.3  2 - 6.9   Granulocyte percent 69.9  37 - 80 %G   RBC 4.94  4.04 - 5.48 M/uL   Hemoglobin 14.3  12.2 - 16.2 g/dL   HCT, POC 16.1  09.6 - 47.9 %   MCV 89.5  80 - 97 fL   MCH, POC 28.9  27 - 31.2 pg   MCHC 32.4  31.8 - 35.4 g/dL   RDW, POC 04.5     Platelet Count, POC 240  142 - 424 K/uL   MPV 8.4  0 - 99.8 fL   XRay from ER reviewed showing SI arthritis Assessment & Plan:  Hip pain, left - Plan: ketorolac (TORADOL) injection 60 mg, POCT SEDIMENTATION RATE, POCT CBC, C-reactive protein, Ambulatory referral to Orthopedic Surgery - suspect piriformis syndrome but needs specialist eval to confirm and treat so will refer to ortho.  Will do short oral steroid burst despite comorbidity of DM  as pt reports DM is well controlled and do to the severity of her pain.  Try alternative muscle relaxant since methocarbamol did not work. toradol 60mg  IM x 1  now.  Piriformis syndrome of left side - Plan: Ambulatory referral to Orthopedic Surgery  Meds ordered this encounter  Medications  . ketorolac (TORADOL) injection 60 mg    Sig:   . methylPREDNISolone (MEDROL, PAK,) 4 MG tablet    Sig: follow package directions    Dispense:  21 tablet    Refill:  0  . metaxalone (SKELAXIN) 800 MG tablet    Sig: Take 1 tablet (800 mg total) by mouth 3 (three) times daily.    Dispense:  30 tablet    Refill:  0  . HYDROcodone-acetaminophen (NORCO) 5-325 MG per tablet    Sig: Take 1 tablet by mouth every 4 (four) hours as needed for pain.    Dispense:  30 tablet    Refill:  0

## 2012-07-04 LAB — C-REACTIVE PROTEIN: CRP: 0.5 mg/dL (ref <0.60)

## 2012-10-03 ENCOUNTER — Other Ambulatory Visit: Payer: Self-pay

## 2012-10-03 DIAGNOSIS — Z1231 Encounter for screening mammogram for malignant neoplasm of breast: Secondary | ICD-10-CM

## 2012-11-06 ENCOUNTER — Ambulatory Visit
Admission: RE | Admit: 2012-11-06 | Discharge: 2012-11-06 | Disposition: A | Discharge status: Home / Self Care | Payer: Medicare Other | Source: Ambulatory Visit

## 2012-11-06 DIAGNOSIS — Z1231 Encounter for screening mammogram for malignant neoplasm of breast: Secondary | ICD-10-CM

## 2013-10-02 ENCOUNTER — Other Ambulatory Visit: Payer: Self-pay

## 2013-10-02 DIAGNOSIS — Z1231 Encounter for screening mammogram for malignant neoplasm of breast: Secondary | ICD-10-CM

## 2013-11-18 ENCOUNTER — Encounter (INDEPENDENT_AMBULATORY_CARE_PROVIDER_SITE_OTHER): Payer: Self-pay

## 2013-11-18 ENCOUNTER — Ambulatory Visit
Admission: RE | Admit: 2013-11-18 | Discharge: 2013-11-18 | Disposition: A | Discharge status: Home / Self Care | Payer: Medicare Other | Source: Ambulatory Visit

## 2013-11-18 DIAGNOSIS — Z1231 Encounter for screening mammogram for malignant neoplasm of breast: Secondary | ICD-10-CM

## 2014-07-09 DIAGNOSIS — E1169 Type 2 diabetes mellitus with other specified complication: Secondary | ICD-10-CM | POA: Diagnosis not present

## 2014-07-09 DIAGNOSIS — I10 Essential (primary) hypertension: Secondary | ICD-10-CM | POA: Diagnosis not present

## 2014-07-09 DIAGNOSIS — E780 Pure hypercholesterolemia: Secondary | ICD-10-CM | POA: Diagnosis not present

## 2014-09-08 DIAGNOSIS — H6123 Impacted cerumen, bilateral: Secondary | ICD-10-CM | POA: Diagnosis not present

## 2014-09-08 DIAGNOSIS — L299 Pruritus, unspecified: Secondary | ICD-10-CM | POA: Diagnosis not present

## 2014-10-14 DIAGNOSIS — H4011X1 Primary open-angle glaucoma, mild stage: Secondary | ICD-10-CM | POA: Diagnosis not present

## 2014-10-28 ENCOUNTER — Other Ambulatory Visit: Payer: Self-pay

## 2014-10-28 DIAGNOSIS — Z1231 Encounter for screening mammogram for malignant neoplasm of breast: Secondary | ICD-10-CM

## 2014-11-07 DIAGNOSIS — Z0000 Encounter for general adult medical examination without abnormal findings: Secondary | ICD-10-CM | POA: Diagnosis not present

## 2014-11-07 DIAGNOSIS — E1169 Type 2 diabetes mellitus with other specified complication: Secondary | ICD-10-CM | POA: Diagnosis not present

## 2014-11-07 DIAGNOSIS — I10 Essential (primary) hypertension: Secondary | ICD-10-CM | POA: Diagnosis not present

## 2014-12-01 ENCOUNTER — Ambulatory Visit: Payer: Self-pay

## 2014-12-09 ENCOUNTER — Ambulatory Visit
Admission: RE | Admit: 2014-12-09 | Discharge: 2014-12-09 | Disposition: A | Discharge status: Home / Self Care | Payer: Medicare Other | Source: Ambulatory Visit

## 2014-12-09 DIAGNOSIS — Z1231 Encounter for screening mammogram for malignant neoplasm of breast: Secondary | ICD-10-CM

## 2015-03-05 DIAGNOSIS — E1169 Type 2 diabetes mellitus with other specified complication: Secondary | ICD-10-CM | POA: Diagnosis not present

## 2015-03-05 DIAGNOSIS — I10 Essential (primary) hypertension: Secondary | ICD-10-CM | POA: Diagnosis not present

## 2015-03-10 ENCOUNTER — Ambulatory Visit
Admission: RE | Admit: 2015-03-10 | Discharge: 2015-03-10 | Disposition: A | Discharge status: Home / Self Care | Payer: Medicare Other | Source: Ambulatory Visit | Attending: Family Medicine | Admitting: Family Medicine

## 2015-03-10 ENCOUNTER — Other Ambulatory Visit: Payer: Self-pay | Admitting: Family Medicine

## 2015-03-10 DIAGNOSIS — M130 Polyarthritis, unspecified: Secondary | ICD-10-CM

## 2015-03-10 DIAGNOSIS — M47816 Spondylosis without myelopathy or radiculopathy, lumbar region: Secondary | ICD-10-CM | POA: Diagnosis not present

## 2015-03-10 DIAGNOSIS — M1611 Unilateral primary osteoarthritis, right hip: Secondary | ICD-10-CM | POA: Diagnosis not present

## 2015-03-10 DIAGNOSIS — Z6832 Body mass index (BMI) 32.0-32.9, adult: Secondary | ICD-10-CM | POA: Diagnosis not present

## 2015-03-10 DIAGNOSIS — I10 Essential (primary) hypertension: Secondary | ICD-10-CM | POA: Diagnosis not present

## 2015-03-10 DIAGNOSIS — E1169 Type 2 diabetes mellitus with other specified complication: Secondary | ICD-10-CM | POA: Diagnosis not present

## 2015-03-25 DIAGNOSIS — E119 Type 2 diabetes mellitus without complications: Secondary | ICD-10-CM | POA: Diagnosis not present

## 2015-03-25 DIAGNOSIS — M130 Polyarthritis, unspecified: Secondary | ICD-10-CM | POA: Diagnosis not present

## 2015-03-25 DIAGNOSIS — Z6832 Body mass index (BMI) 32.0-32.9, adult: Secondary | ICD-10-CM | POA: Diagnosis not present

## 2015-03-25 DIAGNOSIS — I10 Essential (primary) hypertension: Secondary | ICD-10-CM | POA: Diagnosis not present

## 2015-03-30 DIAGNOSIS — H35032 Hypertensive retinopathy, left eye: Secondary | ICD-10-CM | POA: Diagnosis not present

## 2015-03-30 DIAGNOSIS — H401131 Primary open-angle glaucoma, bilateral, mild stage: Secondary | ICD-10-CM | POA: Diagnosis not present

## 2015-03-30 DIAGNOSIS — Z8679 Personal history of other diseases of the circulatory system: Secondary | ICD-10-CM | POA: Diagnosis not present

## 2015-03-30 DIAGNOSIS — H35031 Hypertensive retinopathy, right eye: Secondary | ICD-10-CM | POA: Diagnosis not present

## 2015-06-18 DIAGNOSIS — I10 Essential (primary) hypertension: Secondary | ICD-10-CM | POA: Diagnosis not present

## 2015-06-18 DIAGNOSIS — E1169 Type 2 diabetes mellitus with other specified complication: Secondary | ICD-10-CM | POA: Diagnosis not present

## 2015-06-23 DIAGNOSIS — I10 Essential (primary) hypertension: Secondary | ICD-10-CM | POA: Diagnosis not present

## 2015-06-23 DIAGNOSIS — E7800 Pure hypercholesterolemia, unspecified: Secondary | ICD-10-CM | POA: Diagnosis not present

## 2015-06-23 DIAGNOSIS — Z0000 Encounter for general adult medical examination without abnormal findings: Secondary | ICD-10-CM | POA: Diagnosis not present

## 2015-06-23 DIAGNOSIS — M130 Polyarthritis, unspecified: Secondary | ICD-10-CM | POA: Diagnosis not present

## 2015-06-23 DIAGNOSIS — E119 Type 2 diabetes mellitus without complications: Secondary | ICD-10-CM | POA: Diagnosis not present

## 2015-06-23 DIAGNOSIS — E1169 Type 2 diabetes mellitus with other specified complication: Secondary | ICD-10-CM | POA: Diagnosis not present

## 2015-08-18 DIAGNOSIS — H401131 Primary open-angle glaucoma, bilateral, mild stage: Secondary | ICD-10-CM | POA: Diagnosis not present

## 2015-08-19 DIAGNOSIS — M7551 Bursitis of right shoulder: Secondary | ICD-10-CM | POA: Diagnosis not present

## 2015-08-19 DIAGNOSIS — M1711 Unilateral primary osteoarthritis, right knee: Secondary | ICD-10-CM | POA: Diagnosis not present

## 2015-11-24 DIAGNOSIS — E7800 Pure hypercholesterolemia, unspecified: Secondary | ICD-10-CM | POA: Diagnosis not present

## 2015-11-24 DIAGNOSIS — M130 Polyarthritis, unspecified: Secondary | ICD-10-CM | POA: Diagnosis not present

## 2015-11-24 DIAGNOSIS — E1169 Type 2 diabetes mellitus with other specified complication: Secondary | ICD-10-CM | POA: Diagnosis not present

## 2015-11-24 DIAGNOSIS — I10 Essential (primary) hypertension: Secondary | ICD-10-CM | POA: Diagnosis not present

## 2015-11-24 DIAGNOSIS — E119 Type 2 diabetes mellitus without complications: Secondary | ICD-10-CM | POA: Diagnosis not present

## 2015-12-01 DIAGNOSIS — I10 Essential (primary) hypertension: Secondary | ICD-10-CM | POA: Diagnosis not present

## 2015-12-01 DIAGNOSIS — M130 Polyarthritis, unspecified: Secondary | ICD-10-CM | POA: Diagnosis not present

## 2015-12-01 DIAGNOSIS — E119 Type 2 diabetes mellitus without complications: Secondary | ICD-10-CM | POA: Diagnosis not present

## 2015-12-02 ENCOUNTER — Other Ambulatory Visit: Payer: Self-pay | Admitting: Family Medicine

## 2015-12-02 DIAGNOSIS — Z1231 Encounter for screening mammogram for malignant neoplasm of breast: Secondary | ICD-10-CM

## 2015-12-28 ENCOUNTER — Ambulatory Visit
Admission: RE | Admit: 2015-12-28 | Discharge: 2015-12-28 | Disposition: A | Discharge status: Home / Self Care | Payer: Medicare Other | Source: Ambulatory Visit | Attending: Family Medicine | Admitting: Family Medicine

## 2015-12-28 DIAGNOSIS — Z1231 Encounter for screening mammogram for malignant neoplasm of breast: Secondary | ICD-10-CM | POA: Diagnosis not present

## 2016-04-06 DIAGNOSIS — H2513 Age-related nuclear cataract, bilateral: Secondary | ICD-10-CM | POA: Diagnosis not present

## 2016-04-06 DIAGNOSIS — H25013 Cortical age-related cataract, bilateral: Secondary | ICD-10-CM | POA: Diagnosis not present

## 2016-04-06 DIAGNOSIS — H401131 Primary open-angle glaucoma, bilateral, mild stage: Secondary | ICD-10-CM | POA: Diagnosis not present

## 2016-04-06 DIAGNOSIS — H04123 Dry eye syndrome of bilateral lacrimal glands: Secondary | ICD-10-CM | POA: Diagnosis not present

## 2016-05-04 DIAGNOSIS — H01009 Unspecified blepharitis unspecified eye, unspecified eyelid: Secondary | ICD-10-CM | POA: Diagnosis not present

## 2016-05-04 DIAGNOSIS — H401131 Primary open-angle glaucoma, bilateral, mild stage: Secondary | ICD-10-CM | POA: Diagnosis not present

## 2016-05-04 DIAGNOSIS — H01003 Unspecified blepharitis right eye, unspecified eyelid: Secondary | ICD-10-CM | POA: Diagnosis not present

## 2016-05-04 DIAGNOSIS — H04123 Dry eye syndrome of bilateral lacrimal glands: Secondary | ICD-10-CM | POA: Diagnosis not present

## 2016-05-16 DIAGNOSIS — I10 Essential (primary) hypertension: Secondary | ICD-10-CM | POA: Diagnosis not present

## 2016-05-16 DIAGNOSIS — E119 Type 2 diabetes mellitus without complications: Secondary | ICD-10-CM | POA: Diagnosis not present

## 2016-05-16 DIAGNOSIS — M130 Polyarthritis, unspecified: Secondary | ICD-10-CM | POA: Diagnosis not present

## 2016-05-18 DIAGNOSIS — I10 Essential (primary) hypertension: Secondary | ICD-10-CM | POA: Diagnosis not present

## 2016-05-18 DIAGNOSIS — M130 Polyarthritis, unspecified: Secondary | ICD-10-CM | POA: Diagnosis not present

## 2016-05-18 DIAGNOSIS — E1169 Type 2 diabetes mellitus with other specified complication: Secondary | ICD-10-CM | POA: Diagnosis not present

## 2016-06-01 DIAGNOSIS — H04123 Dry eye syndrome of bilateral lacrimal glands: Secondary | ICD-10-CM | POA: Diagnosis not present

## 2016-06-01 DIAGNOSIS — H401131 Primary open-angle glaucoma, bilateral, mild stage: Secondary | ICD-10-CM | POA: Diagnosis not present

## 2016-06-09 HISTORY — PX: EYE SURGERY: SHX253

## 2016-06-21 DIAGNOSIS — H25812 Combined forms of age-related cataract, left eye: Secondary | ICD-10-CM | POA: Diagnosis not present

## 2016-06-21 DIAGNOSIS — H2512 Age-related nuclear cataract, left eye: Secondary | ICD-10-CM | POA: Diagnosis not present

## 2016-06-21 DIAGNOSIS — H25012 Cortical age-related cataract, left eye: Secondary | ICD-10-CM | POA: Diagnosis not present

## 2016-07-21 DIAGNOSIS — H401131 Primary open-angle glaucoma, bilateral, mild stage: Secondary | ICD-10-CM | POA: Diagnosis not present

## 2016-07-21 DIAGNOSIS — H25011 Cortical age-related cataract, right eye: Secondary | ICD-10-CM | POA: Diagnosis not present

## 2016-07-21 DIAGNOSIS — H2511 Age-related nuclear cataract, right eye: Secondary | ICD-10-CM | POA: Diagnosis not present

## 2016-08-16 DIAGNOSIS — H25811 Combined forms of age-related cataract, right eye: Secondary | ICD-10-CM | POA: Diagnosis not present

## 2016-08-16 DIAGNOSIS — H2511 Age-related nuclear cataract, right eye: Secondary | ICD-10-CM | POA: Diagnosis not present

## 2016-09-20 DIAGNOSIS — I10 Essential (primary) hypertension: Secondary | ICD-10-CM | POA: Diagnosis not present

## 2016-09-20 DIAGNOSIS — E119 Type 2 diabetes mellitus without complications: Secondary | ICD-10-CM | POA: Diagnosis not present

## 2016-11-01 DIAGNOSIS — K219 Gastro-esophageal reflux disease without esophagitis: Secondary | ICD-10-CM | POA: Diagnosis not present

## 2016-11-01 DIAGNOSIS — J342 Deviated nasal septum: Secondary | ICD-10-CM | POA: Diagnosis not present

## 2016-11-01 DIAGNOSIS — J019 Acute sinusitis, unspecified: Secondary | ICD-10-CM | POA: Diagnosis not present

## 2016-11-01 DIAGNOSIS — H9113 Presbycusis, bilateral: Secondary | ICD-10-CM | POA: Diagnosis not present

## 2016-11-01 DIAGNOSIS — H6123 Impacted cerumen, bilateral: Secondary | ICD-10-CM | POA: Diagnosis not present

## 2016-11-22 DIAGNOSIS — M65872 Other synovitis and tenosynovitis, left ankle and foot: Secondary | ICD-10-CM | POA: Diagnosis not present

## 2016-11-22 DIAGNOSIS — M792 Neuralgia and neuritis, unspecified: Secondary | ICD-10-CM | POA: Diagnosis not present

## 2016-11-22 DIAGNOSIS — M79671 Pain in right foot: Secondary | ICD-10-CM | POA: Diagnosis not present

## 2016-11-22 DIAGNOSIS — M79672 Pain in left foot: Secondary | ICD-10-CM | POA: Diagnosis not present

## 2016-11-22 DIAGNOSIS — M25571 Pain in right ankle and joints of right foot: Secondary | ICD-10-CM | POA: Diagnosis not present

## 2016-11-22 DIAGNOSIS — M65871 Other synovitis and tenosynovitis, right ankle and foot: Secondary | ICD-10-CM | POA: Diagnosis not present

## 2016-11-22 DIAGNOSIS — M25572 Pain in left ankle and joints of left foot: Secondary | ICD-10-CM | POA: Diagnosis not present

## 2016-11-29 ENCOUNTER — Other Ambulatory Visit: Payer: Self-pay

## 2016-11-29 DIAGNOSIS — M792 Neuralgia and neuritis, unspecified: Secondary | ICD-10-CM | POA: Diagnosis not present

## 2016-11-29 DIAGNOSIS — R0989 Other specified symptoms and signs involving the circulatory and respiratory systems: Secondary | ICD-10-CM

## 2016-11-30 DIAGNOSIS — M79672 Pain in left foot: Secondary | ICD-10-CM | POA: Diagnosis not present

## 2016-11-30 DIAGNOSIS — M79671 Pain in right foot: Secondary | ICD-10-CM | POA: Diagnosis not present

## 2016-12-19 ENCOUNTER — Other Ambulatory Visit: Payer: Self-pay

## 2016-12-19 NOTE — Patient Outreach (Signed)
[facility] [place] [facility]) Care Management  12/19/2016  Gloria Murphy 1935-12-11 [id number]    Medication Adherence call to Gloria Murphy the reason for this call is because she is showing under [place]. Past due on her rosuvastatin 20 mg spoke to Gloria Murphy she was thinking she still had some medication she check all her bottle did not have any medicine  she ask if we can call [facility] for them to fill the rosuvastatin 20 mg call Gloria Murphy back told her medication will be ready for her.    [place] Management Direct Dial [phone]  Fax [phone] [email or web address]

## 2016-12-20 DIAGNOSIS — M79672 Pain in left foot: Secondary | ICD-10-CM | POA: Diagnosis not present

## 2016-12-20 DIAGNOSIS — M79671 Pain in right foot: Secondary | ICD-10-CM | POA: Diagnosis not present

## 2016-12-20 DIAGNOSIS — M792 Neuralgia and neuritis, unspecified: Secondary | ICD-10-CM | POA: Diagnosis not present

## 2016-12-22 ENCOUNTER — Other Ambulatory Visit: Payer: Self-pay | Admitting: *Deleted

## 2016-12-22 NOTE — Patient Outreach (Signed)
[facility]) Care Management  12/22/2016  Gloria Murphy 01-30-1936 [id number]  Telephone Screen  Referral Date: 12/20/16 Referral Source: Medication Adherence Referral Reason: Score of 9, DM, HTN Insurance: [facility] Medicare  Outreach attempt # 1 to patient. Spoke to patient. HIPAA verified with patient.   Social:  Patient was recently married. She lives with her husband. She is independent with ADLs. She transports herself to all of her medical appointments.   Conditions: Past Medical Hx: Arthritis, DM, HLD, HTN, Hearing Loss, Glaucoma Patient reported, she is doing well. She believes her DM is being controlled. Her A1C decreased from 7.1 to 5.5. She verbalized having chronic knee pain. She had orthoscopic knee surgery about 30 years ago. She manages her pain with rest, relaxation, and prescribed medication.   Medications: She can afford all of her medications. Patient takes her medications as prescribed.   Advanced Directives: Patient verbalized having a Living Will and HPOA.   Consent: [facility] services reviewed and discussed with spouse. She agreed to services.   Plan: RN CM will send [place] referral for assistance with mail order.   Lake Bells, RN, BSN, MHA/MSL, [place] Telephonic Care Manager Coordinator [facility] Direct Phone: [phone] Toll Free: [phone] Fax: [phone]

## 2017-01-03 ENCOUNTER — Other Ambulatory Visit: Payer: Self-pay | Admitting: Family Medicine

## 2017-01-03 ENCOUNTER — Other Ambulatory Visit: Payer: Self-pay | Admitting: Pharmacist

## 2017-01-03 DIAGNOSIS — Z1231 Encounter for screening mammogram for malignant neoplasm of breast: Secondary | ICD-10-CM

## 2017-01-03 NOTE — Patient Outreach (Signed)
[facility]) Care Management  01/03/2017  Gloria Murphy 03/24/1936 [id number]  Patient was referred to [place] Pharmacist by [facility] RN [place] for questions about mail order pharmacy.   Attempted to reach patient on home and cell-phone number in chart.  No answer, unable to leave message.  Plan:  Will make second outreach attempt to patient in the next week.   Karrie Meres, PharmD, [place] [phone]

## 2017-01-04 ENCOUNTER — Encounter: Payer: Self-pay | Admitting: Vascular Surgery

## 2017-01-05 DIAGNOSIS — H16223 Keratoconjunctivitis sicca, not specified as Sjogren's, bilateral: Secondary | ICD-10-CM | POA: Diagnosis not present

## 2017-01-05 DIAGNOSIS — H04123 Dry eye syndrome of bilateral lacrimal glands: Secondary | ICD-10-CM | POA: Diagnosis not present

## 2017-01-05 DIAGNOSIS — H401131 Primary open-angle glaucoma, bilateral, mild stage: Secondary | ICD-10-CM | POA: Diagnosis not present

## 2017-01-05 DIAGNOSIS — H1851 Endothelial corneal dystrophy: Secondary | ICD-10-CM | POA: Diagnosis not present

## 2017-01-10 ENCOUNTER — Ambulatory Visit
Admission: RE | Admit: 2017-01-10 | Discharge: 2017-01-10 | Disposition: A | Discharge status: Home / Self Care | Payer: Medicare Other | Source: Ambulatory Visit | Attending: Family Medicine | Admitting: Family Medicine

## 2017-01-10 DIAGNOSIS — Z1231 Encounter for screening mammogram for malignant neoplasm of breast: Secondary | ICD-10-CM

## 2017-01-12 ENCOUNTER — Encounter: Payer: Self-pay | Admitting: Vascular Surgery

## 2017-01-12 ENCOUNTER — Ambulatory Visit (INDEPENDENT_AMBULATORY_CARE_PROVIDER_SITE_OTHER): Payer: Medicare Other | Admitting: Vascular Surgery

## 2017-01-12 ENCOUNTER — Ambulatory Visit (HOSPITAL_COMMUNITY)
Admission: RE | Admit: 2017-01-12 | Discharge: 2017-01-12 | Disposition: A | Discharge status: Home / Self Care | Payer: Medicare Other | Source: Ambulatory Visit | Attending: Vascular Surgery | Admitting: Vascular Surgery

## 2017-01-12 VITALS — BP 146/85 | HR 80 | Temp 97.2°F | Ht 69.0 in | Wt 215.0 lb

## 2017-01-12 DIAGNOSIS — R0989 Other specified symptoms and signs involving the circulatory and respiratory systems: Secondary | ICD-10-CM

## 2017-01-12 DIAGNOSIS — I739 Peripheral vascular disease, unspecified: Secondary | ICD-10-CM | POA: Diagnosis not present

## 2017-01-12 NOTE — Progress Notes (Signed)
Referring Physician: Jed Limerick DPM Patient name: Gloria Murphy MRN: [id number] DOB: Sep 28, 1935 Sex: female  REASON FOR CONSULT: Bilateral foot pain and swelling  HPI: Gloria Murphy is a 81 y.o. female who complains of bilateral pain and swelling in both feet. This is been present for several years. She also complains of pain in her right knee with walking. She also has right ankle pain. She does not really describe claudication. She did have a right knee meniscus tear in the past. She has otherwise had no operations. Atherosclerotic risk factors include diabetes diagnosed almost 10 years ago. Elevated cholesterol hypertension. She denies tobacco use. She is on a statin. She is not on aspirin. This has caused nosebleeds in the past. She denies any prior history of DVT.   Past Medical History:  Diagnosis Date  . Diabetes mellitus without complication ([place])   . High cholesterol   . Hypertension    Past Surgical History:  Procedure Laterality Date  . ABDOMINAL HYSTERECTOMY      No family history on file.  SOCIAL HISTORY: Social History   Social History  . Marital status: Widowed    Spouse name: N/A  . Number of children: N/A  . Years of education: N/A   Occupational History  . Not on file.   Social History Main Topics  . Smoking status: Never Smoker  . Smokeless tobacco: Never Used  . Alcohol use No  . Drug use: No  . Sexual activity: Not on file   Other Topics Concern  . Not on file   Social History Narrative  . No narrative on file    No Known Allergies  Current Outpatient Prescriptions  Medication Sig Dispense Refill  . bimatoprost (LUMIGAN) 0.03 % ophthalmic solution Place 1 drop into both eyes at bedtime.    . Calcium Carbonate (CALTRATE 600 PO) Take 1 tablet by mouth daily.    . ferrous sulfate 325 (65 FE) MG tablet Take 325 mg by mouth daily with breakfast.    . HYDROcodone-acetaminophen (NORCO) 5-325 MG per tablet Take 1 tablet by mouth every 4  (four) hours as needed for pain. 30 tablet 0  . metaxalone (SKELAXIN) 800 MG tablet Take 1 tablet (800 mg total) by mouth 3 (three) times daily. 30 tablet 0  . methylPREDNISolone (MEDROL, PAK,) 4 MG tablet follow package directions 21 tablet 0  . Multiple Vitamin (MULTIVITAMIN) tablet Take 1 tablet by mouth daily.    . Olmesartan-Amlodipine-HCTZ (TRIBENZOR PO) Take 1 tablet by mouth daily.    . Omega-3 Fatty Acids (FISH OIL) 500 MG CAPS Take 1 capsule by mouth daily.    [name] OVER THE COUNTER MEDICATION Take 1 tablet by mouth daily. Potassium.    . rosuvastatin (CRESTOR) 20 MG tablet Take 20 mg by mouth daily.    . Skin Protectants, Misc. (EUCERIN) cream Apply 1 application topically as needed for dry skin.    [name] traMADol (ULTRAM) 50 MG tablet Take 1 tablet (50 mg total) by mouth every 6 (six) hours as needed for pain. 15 tablet 0   No current facility-administered medications for this visit.     ROS:   General:  No weight loss, Fever, chills  HEENT: No recent headaches, no nasal bleeding, no visual changes, no sore throat  Neurologic: No dizziness, blackouts, seizures. No recent symptoms of stroke or mini- stroke. No recent episodes of slurred speech, or temporary blindness.  Cardiac: No recent episodes of chest pain/pressure, no shortness of breath at  rest.  No shortness of breath with exertion.  Denies history of atrial fibrillation or irregular heartbeat  Vascular: No history of rest pain in feet.  No history of claudication.  No history of non-healing ulcer, No history of DVT   Pulmonary: No home oxygen, no productive cough, no hemoptysis,  No asthma or wheezing  Musculoskeletal:  [X]  Arthritis, [ ]  Low back pain,  [X]  Joint pain  Hematologic:No history of hypercoagulable state.  No history of easy bleeding.  + history of anemia  Gastrointestinal: No hematochezia or melena,  No gastroesophageal reflux, no trouble swallowing  Urinary: [ ]  chronic Kidney disease, [ ]  on HD - [ ]  MWF  or [ ]  TTHS, [ ]  Burning with urination, [ ]  Frequent urination, [ ]  Difficulty urinating;   Skin: No rashes  Psychological: No history of anxiety,  No history of depression   Physical Examination  Vitals:   01/12/17 0934  BP: (!) 146/85  Pulse: 80  Temp: (!) 97.2 F (36.2 C)  TempSrc: Oral  SpO2: 99%  Weight: 215 lb (97.5 kg)  Height: 5\' 9"  (1.753 m)    Body mass index is 31.75 kg/m.  General:  Alert and oriented, no acute distress HEENT: Normal Neck: No bruit or JVD Pulmonary: Clear to auscultation bilaterally Cardiac: Regular Rate and Rhythm without murmur Abdomen: Soft, non-tender, non-distended, no mass, no scars Skin: No rash, hemosiderin staining right gaiter area Extremity Pulses:  2+ radial, brachial, femoral, 2+ left dorsalis pedis, absent right dorsalis pedis posterior tibial pulses bilaterally Musculoskeletal: No deformity trace edema  Neurologic: Upper and lower extremity motor 5/5 and symmetric  DATA:  Patient had bilateral ABIs performed today which were triphasic and normal bilaterally greater than 1 toe pressure 97 on the left 95 on the right  ASSESSMENT:  Patient with normal noninvasive arterial exam despite difficult to palpate right pedal pulses. I agree with Dr. Geroge Baseman that this is most likely secondary to edema. The patient may have some element of venous reflux contributing to her swelling symptoms. However, her pain symptoms seem to be more degenerative joint disease in nature. I discussed the patient today the pathophysiology of venous reflux disease. I also prescribed for her lower extremity compression stockings for symptomatic relief. She may have some element of mild peripheral arterial disease as well. However, she does not really seem limited by her symptoms currently.   PLAN:  The patient will follow-up with [place] in 6 months time with an arterial duplex of her lower extremities. If this is a normal exam she may not need further workup. She will  try to walk 30 minutes daily. She will also wear her compression stockings during the day.   Ruta Hinds, MD [facility] Office: [phone] Pager: [phone]

## 2017-01-13 NOTE — Addendum Note (Signed)
Addended by: Lianne Cure A on: 01/13/2017 10:51 AM   Modules accepted: Orders

## 2017-01-18 DIAGNOSIS — E785 Hyperlipidemia, unspecified: Secondary | ICD-10-CM | POA: Diagnosis not present

## 2017-01-18 DIAGNOSIS — M130 Polyarthritis, unspecified: Secondary | ICD-10-CM | POA: Diagnosis not present

## 2017-01-18 DIAGNOSIS — E1169 Type 2 diabetes mellitus with other specified complication: Secondary | ICD-10-CM | POA: Diagnosis not present

## 2017-01-18 DIAGNOSIS — I10 Essential (primary) hypertension: Secondary | ICD-10-CM | POA: Diagnosis not present

## 2017-02-09 ENCOUNTER — Other Ambulatory Visit: Payer: Self-pay | Admitting: Pharmacist

## 2017-02-09 NOTE — Patient Outreach (Signed)
[facility]) Care Management  02/09/2017  VIANCA BRACHER 1936-01-19 [id number]  Second unsuccessful phone outreach attempt to patient.  HIPAA compliant message left requesting return call.   Plan:  Will make third outreach attempt next week.   Karrie Meres, PharmD, [place] [phone]

## 2017-02-13 ENCOUNTER — Other Ambulatory Visit: Payer: Self-pay | Admitting: Pharmacist

## 2017-02-13 ENCOUNTER — Encounter: Payer: Self-pay | Admitting: Pharmacist

## 2017-02-13 NOTE — Patient Outreach (Signed)
[facility]) Care Management  02/13/2017  Gloria Murphy [birth year] [id number]  Third unsuccessful phone outreach to patient.  HIPAA compliant message left requesting return call.   Plan:  If no return call, will send patient outreach letter.  If no reply from letter within 10 business days, will close case.   Karrie Meres, PharmD, [place] [phone]

## 2017-02-20 DIAGNOSIS — Z0000 Encounter for general adult medical examination without abnormal findings: Secondary | ICD-10-CM | POA: Diagnosis not present

## 2017-02-27 ENCOUNTER — Other Ambulatory Visit: Payer: Self-pay | Admitting: Pharmacist

## 2017-02-27 NOTE — Patient Outreach (Signed)
[facility]) Care Management  02/27/2017  Gloria Murphy 1935/07/13 [id number]  Three outreach calls were made to patient and outreach letter was mailed to patient.  No reply after 10 business days.   Plan:  Will close case due to inability to contact patient.   Karrie Meres, PharmD, [place] [phone]

## 2017-05-08 DIAGNOSIS — Z961 Presence of intraocular lens: Secondary | ICD-10-CM | POA: Diagnosis not present

## 2017-05-08 DIAGNOSIS — H401131 Primary open-angle glaucoma, bilateral, mild stage: Secondary | ICD-10-CM | POA: Diagnosis not present

## 2017-05-08 DIAGNOSIS — H35033 Hypertensive retinopathy, bilateral: Secondary | ICD-10-CM | POA: Diagnosis not present

## 2017-05-08 DIAGNOSIS — E119 Type 2 diabetes mellitus without complications: Secondary | ICD-10-CM | POA: Diagnosis not present

## 2017-05-24 DIAGNOSIS — M130 Polyarthritis, unspecified: Secondary | ICD-10-CM | POA: Diagnosis not present

## 2017-05-24 DIAGNOSIS — I10 Essential (primary) hypertension: Secondary | ICD-10-CM | POA: Diagnosis not present

## 2017-05-24 DIAGNOSIS — E119 Type 2 diabetes mellitus without complications: Secondary | ICD-10-CM | POA: Diagnosis not present

## 2017-05-30 DIAGNOSIS — E119 Type 2 diabetes mellitus without complications: Secondary | ICD-10-CM | POA: Diagnosis not present

## 2017-05-30 DIAGNOSIS — E785 Hyperlipidemia, unspecified: Secondary | ICD-10-CM | POA: Diagnosis not present

## 2017-05-30 DIAGNOSIS — R739 Hyperglycemia, unspecified: Secondary | ICD-10-CM | POA: Diagnosis not present

## 2017-05-30 DIAGNOSIS — I10 Essential (primary) hypertension: Secondary | ICD-10-CM | POA: Diagnosis not present

## 2017-06-27 DIAGNOSIS — H401131 Primary open-angle glaucoma, bilateral, mild stage: Secondary | ICD-10-CM | POA: Diagnosis not present

## 2017-06-27 DIAGNOSIS — H1851 Endothelial corneal dystrophy: Secondary | ICD-10-CM | POA: Diagnosis not present

## 2017-06-27 DIAGNOSIS — H04123 Dry eye syndrome of bilateral lacrimal glands: Secondary | ICD-10-CM | POA: Diagnosis not present

## 2017-06-27 DIAGNOSIS — H16223 Keratoconjunctivitis sicca, not specified as Sjogren's, bilateral: Secondary | ICD-10-CM | POA: Diagnosis not present

## 2017-07-13 ENCOUNTER — Ambulatory Visit (HOSPITAL_COMMUNITY)
Admission: RE | Admit: 2017-07-13 | Discharge: 2017-07-13 | Disposition: A | Discharge status: Home / Self Care | Payer: Medicare Other | Source: Ambulatory Visit | Attending: Family | Admitting: Family

## 2017-07-13 ENCOUNTER — Other Ambulatory Visit: Payer: Self-pay

## 2017-07-13 ENCOUNTER — Encounter: Payer: Self-pay | Admitting: Family

## 2017-07-13 ENCOUNTER — Ambulatory Visit (INDEPENDENT_AMBULATORY_CARE_PROVIDER_SITE_OTHER)
Admission: RE | Admit: 2017-07-13 | Discharge: 2017-07-13 | Disposition: A | Discharge status: Home / Self Care | Payer: Medicare Other | Source: Ambulatory Visit | Attending: Family | Admitting: Family

## 2017-07-13 ENCOUNTER — Ambulatory Visit: Payer: Medicare Other | Admitting: Family

## 2017-07-13 VITALS — BP 138/76 | HR 76 | Resp 18 | Ht 69.0 in | Wt 204.7 lb

## 2017-07-13 DIAGNOSIS — I872 Venous insufficiency (chronic) (peripheral): Secondary | ICD-10-CM | POA: Diagnosis not present

## 2017-07-13 DIAGNOSIS — I779 Disorder of arteries and arterioles, unspecified: Secondary | ICD-10-CM

## 2017-07-13 DIAGNOSIS — I739 Peripheral vascular disease, unspecified: Secondary | ICD-10-CM | POA: Diagnosis not present

## 2017-07-13 DIAGNOSIS — R1905 Periumbilic swelling, mass or lump: Secondary | ICD-10-CM | POA: Diagnosis not present

## 2017-07-13 NOTE — Progress Notes (Signed)
[facility] [place]   CC: Follow up peripheral artery occlusive disease  History of Present Illness Gloria Murphy is a 82 y.o. female who complains of bilateral pain and swelling in both feet. This is been present for several years. She also complains of pain in her right knee with walking. She also has right ankle pain. She does not really describe claudication. She did have a right knee meniscus tear in the past. She has otherwise had no operations other than abdominal hysterectomy. Atherosclerotic risk factors include diabetes diagnosed about 2008. Elevated cholesterol hypertension. She denies tobacco use. She is on a statin. She is not on aspirin. This has caused nosebleeds in the past. She denies any prior history of DVT.    Dr. Oneida Alar last evaluated pt on 01-12-17. At that time normal noninvasive arterial exam despite difficult to palpate right pedal pulses. Dr. Oneida Alar agreed with Dr. Geroge Baseman that this is most likely secondary to edema. The patient may have some element of venous reflux contributing to her swelling symptoms. However, her pain symptoms seem to be more degenerative joint disease in nature. Dr. Oneida Alar discussed with the patient the pathophysiology of venous reflux disease. He also prescribed for her lower extremity compression stockings for symptomatic relief. She may have some element of mild peripheral arterial disease as well. However, she does not really seem limited by her symptoms at that time.  The patient was to follow-up with [place] in 6 months time with an arterial duplex of her lower extremities. If this is a normal exam she may not need further workup. She will try to walk 30 minutes daily. She will also wear her compression stockings during the day.  She is wearing the knee high compression hose and states it helps reduce the swelling in her legs.   Her walking is limited by her lumbar spine and arthitis issues, does not indicate claudication. She  denies any known hx of stroke or TIA.  She denies abdominal pain.    Diabetic: Yes, states her last A1C was 5.6 Tobacco use: non-smoker  Pt meds include: Statin :Yes Betablocker: No ASA: No, caused nose bleeds Other anticoagulants/antiplatelets: no  Past Medical History:  Diagnosis Date  . Diabetes mellitus without complication ([place])   . High cholesterol   . Hypertension     Social History Social History   Tobacco Use  . Smoking status: Never Smoker  . Smokeless tobacco: Never Used  Substance Use Topics  . Alcohol use: No  . Drug use: No    Family History History reviewed. No pertinent family history.  Past Surgical History:  Procedure Laterality Date  . ABDOMINAL HYSTERECTOMY      No Known Allergies  Current Outpatient Medications  Medication Sig Dispense Refill  . bimatoprost (LUMIGAN) 0.03 % ophthalmic solution Place 1 drop into both eyes at bedtime.    . Calcium Carbonate (CALTRATE 600 PO) Take 1 tablet by mouth daily.    . ferrous sulfate 325 (65 FE) MG tablet Take 325 mg by mouth daily with breakfast.    . HYDROcodone-acetaminophen (NORCO) 5-325 MG per tablet Take 1 tablet by mouth every 4 (four) hours as needed for pain. 30 tablet 0  . metaxalone (SKELAXIN) 800 MG tablet Take 1 tablet (800 mg total) by mouth 3 (three) times daily. 30 tablet 0  . methylPREDNISolone (MEDROL, PAK,) 4 MG tablet follow package directions 21 tablet 0  . Multiple Vitamin (MULTIVITAMIN) tablet Take 1 tablet by mouth daily.    [name]  Olmesartan-Amlodipine-HCTZ (TRIBENZOR PO) Take 1 tablet by mouth daily.    . Omega-3 Fatty Acids (FISH OIL) 500 MG CAPS Take 1 capsule by mouth daily.    [name] OVER THE COUNTER MEDICATION Take 1 tablet by mouth daily. Potassium.    . rosuvastatin (CRESTOR) 20 MG tablet Take 20 mg by mouth daily.    . Skin Protectants, Misc. (EUCERIN) cream Apply 1 application topically as needed for dry skin.    [name] traMADol (ULTRAM) 50 MG tablet Take 1 tablet (50 mg total) by  mouth every 6 (six) hours as needed for pain. 15 tablet 0   No current facility-administered medications for this visit.     ROS: See HPI for pertinent positives and negatives.   Physical Examination  Vitals:   07/13/17 1426  BP: 138/76  Pulse: 76  Resp: 18  SpO2: 100%  Weight: 204 lb 11.2 oz (92.9 kg)  Height: 5\' 9"  (1.753 m)   Body mass index is 30.23 kg/m.  General: A&O x 3, WDWN, obese female. Gait: normal HENT: No gross abnormalities.  Eyes: PERRLA. Pulmonary: Respirations are non labored, CTAB, good air movement Cardiac: regular rhythm, no detected murmur.         Carotid Bruits Right Left   Negative Negative   Radial pulses are 2+ palpable bilaterally   Adominal aortic pulse is not palpable                         VASCULAR EXAM: Extremities without ischemic changes, without Gangrene; open wounds. Bilateral ankles with 1+ pitting and non pitting edema. Hemosiderin deposits in both lower legs.                                                                                                           LE Pulses Right Left       FEMORAL  2+ palpable  2+ palpable        POPLITEAL  not palpable   not palpable       POSTERIOR TIBIAL  not palpable   not palpable        DORSALIS PEDIS      ANTERIOR TIBIAL not palpable  2+ palpable    Abdomen: soft, NT, large immobile periumbilical mass.  Skin: no rashes, no cellulitis, no ulcers noted. Musculoskeletal: no muscle wasting or atrophy.  Neurologic: A&O X 3; appropriate affect, Sensation is normal; MOTOR FUNCTION:  moving all extremities equally, motor strength 5/5 throughout. Speech is fluent/normal. CN 2-12 intact. Psychiatric: Thought content is normal, mood appropriate for clinical situation.     ASSESSMENT: Gloria Murphy is a 82 y.o. female who presents with normal bilateral ABI's, and pain in her low back and occasional sciatic pain, with history of known low back problems.  Her walking is limited by low  back issues and arthritis pain.  There are no signs of ischemia in her feet or legs.   She has some mild/moderate chronic venous insufficiency; wearing her knee high compression hose decreases the swelling in her lower  legs.    The only abdominal surgery she has had is an abdominal hysterectomy years ago, states she still has her ovaries. Large immobile periumbilical mass palpated, not a hernia. Pt denies abdominal pain. See Plan.     DATA  Bilateral LE Arterial Duplex (07/13/17): No evidence of arterial occlusive disease in both legs.   ABI (Date: 07/13/2017):  R:   ABI: 1.09 (was 1.08 on 01-12-17),   PT: bi  DP: tri  TBI:  0.68 (was 0.71)  L:   ABI: 1.12 (was 1.20),   PT: tri  DP: tri  TBI: 0.69 (was 0.73)  ABI's remain normal with tri and biphasic waveforms.     PLAN:  Based on the patient's vascular studies and examination, and after discussing with Dr. Oneida Alar, pt will be scheduled for CT abd/pelvis with IV and oral contrast in 2 weeks to evaluate large asymptomatic non mobile periumbilical mass, see me afterward.  I discussed in depth with the patient the nature of atherosclerosis, and emphasized the importance of maximal medical management including strict control of blood pressure, blood glucose, and lipid levels, obtaining regular exercise, and continued cessation of smoking.  The patient is aware that without maximal medical management the underlying atherosclerotic disease process will progress, limiting the benefit of any interventions.  The patient was given information about PAD including signs, symptoms, treatment, what symptoms should prompt the patient to seek immediate medical care, and risk reduction measures to take.  Clemon Chambers, RN, MSN, FNP-C [facility] Phone: [phone]  Clinic MD: Oneida Alar  07/13/17 2:34 PM

## 2017-07-14 ENCOUNTER — Other Ambulatory Visit: Payer: Self-pay

## 2017-07-14 DIAGNOSIS — R1900 Intra-abdominal and pelvic swelling, mass and lump, unspecified site: Secondary | ICD-10-CM

## 2017-07-27 ENCOUNTER — Ambulatory Visit
Admission: RE | Admit: 2017-07-27 | Discharge: 2017-07-27 | Disposition: A | Discharge status: Home / Self Care | Payer: Medicare Other | Source: Ambulatory Visit | Attending: Family | Admitting: Family

## 2017-07-27 ENCOUNTER — Inpatient Hospital Stay: Admission: RE | Admit: 2017-07-27 | Payer: Medicare Other | Source: Ambulatory Visit

## 2017-07-27 DIAGNOSIS — R1900 Intra-abdominal and pelvic swelling, mass and lump, unspecified site: Secondary | ICD-10-CM

## 2017-07-27 DIAGNOSIS — R1909 Other intra-abdominal and pelvic swelling, mass and lump: Secondary | ICD-10-CM | POA: Diagnosis not present

## 2017-07-27 MED ORDER — IOPAMIDOL (ISOVUE-300) INJECTION 61%
100.0000 mL | Freq: Once | INTRAVENOUS | Status: AC | PRN
  Administered 2017-07-27: 100 mL via INTRAVENOUS

## 2017-07-28 ENCOUNTER — Encounter: Payer: Self-pay | Admitting: Family

## 2017-07-28 ENCOUNTER — Telehealth: Payer: Self-pay | Admitting: *Deleted

## 2017-07-28 ENCOUNTER — Ambulatory Visit: Payer: Medicare Other | Admitting: Family

## 2017-07-28 ENCOUNTER — Other Ambulatory Visit: Payer: Self-pay

## 2017-07-28 VITALS — BP 123/72 | HR 75 | Temp 98.1°F | Resp 16 | Ht 69.0 in | Wt 202.0 lb

## 2017-07-28 DIAGNOSIS — I872 Venous insufficiency (chronic) (peripheral): Secondary | ICD-10-CM

## 2017-07-28 DIAGNOSIS — R1900 Intra-abdominal and pelvic swelling, mass and lump, unspecified site: Secondary | ICD-10-CM | POA: Diagnosis not present

## 2017-07-28 NOTE — Progress Notes (Signed)
[facility] [place]   CC: Follow up CTA abdomen/pelviis to evaluate large periumbilical mass  History of Present Illness Gloria Murphy is a 82 y.o. female who was initially evaluated for c/o of bilateral pain and swelling in both feet. This had been present for several years. She also complains of pain in her right knee with walking. She also has right ankle pain. She does not really describe claudication. She did have a right knee meniscus tear in the past. She has otherwise had no operations other than abdominal hysterectomy.  She returns today for discussion of CTA abd/pelvis results done yesterday to evaluate large periumbilical mass I found on physical exam on 07-17-17. Her husband is present.   Atherosclerotic risk factors include diabetes diagnosed about 2008. Elevated cholesterol hypertension. She denies tobacco use. She is on a statin. She is not on aspirin. This has caused nosebleeds in the past. She denies any prior history of DVT.   Dr. Oneida Alar last evaluated pt on 01-12-17. At that time normal noninvasive arterial exam despite difficult to palpate right pedal pulses. Dr. Oneida Alar agreed with Dr. Geroge Baseman that this is most likely secondary to edema. The patient may have some element of venous reflux contributing to her swelling symptoms. However, her pain symptoms seem to be more degenerative joint disease in nature. Dr. Oneida Alar discussed with the patient the pathophysiology of venous reflux disease. He also prescribed for her lower extremity compression stockings for symptomatic relief. She may have some element of mild peripheral arterial disease as well. However, she does not really seem limited by her symptoms at that time. The patient was to follow-up with [place] in 6 months time with an arterial duplex of her lower extremities. If this is a normal exam she may not need further workup. She will try to walk 30 minutes daily. She will also wear her compression stockings  during the day.  She is wearing the knee high compression hose and states it helps reduce the swelling in her legs.   Her walking is limited by her lumbar spine and arthitis issues, does not indicate claudication. She denies any known hx of stroke or TIA.  She denies abdominal pain.    Diabetic: Yes, states her last A1C was 5.6 Tobacco use: non-smoker  Pt meds include: Statin :Yes Betablocker: No ASA: No, caused nose bleeds Other anticoagulants/antiplatelets: no   Past Medical History:  Diagnosis Date  . Diabetes mellitus without complication ([place])   . High cholesterol   . Hypertension     Social History Social History   Tobacco Use  . Smoking status: Never Smoker  . Smokeless tobacco: Never Used  Substance Use Topics  . Alcohol use: No  . Drug use: No    Family History History reviewed. No pertinent family history.  Past Surgical History:  Procedure Laterality Date  . ABDOMINAL HYSTERECTOMY      No Known Allergies  Current Outpatient Medications  Medication Sig Dispense Refill  . Calcium Carbonate (CALTRATE 600 PO) Take 1 tablet by mouth daily.    . ferrous sulfate 325 (65 FE) MG tablet Take 325 mg by mouth daily with breakfast.    . HYDROcodone-acetaminophen (NORCO) 5-325 MG per tablet Take 1 tablet by mouth every 4 (four) hours as needed for pain. 30 tablet 0  . metaxalone (SKELAXIN) 800 MG tablet Take 1 tablet (800 mg total) by mouth 3 (three) times daily. 30 tablet 0  . methylPREDNISolone (MEDROL, PAK,) 4 MG tablet follow package  directions 21 tablet 0  . Multiple Vitamin (MULTIVITAMIN) tablet Take 1 tablet by mouth daily.    . Olmesartan-Amlodipine-HCTZ (TRIBENZOR PO) Take 1 tablet by mouth daily.    [name] OVER THE COUNTER MEDICATION Take 1 tablet by mouth daily. Potassium.    . rosuvastatin (CRESTOR) 20 MG tablet Take 20 mg by mouth daily.    . Skin Protectants, Misc. (EUCERIN) cream Apply 1 application topically as needed for dry skin.    .  bimatoprost (LUMIGAN) 0.03 % ophthalmic solution Place 1 drop into both eyes at bedtime.    . Omega-3 Fatty Acids (FISH OIL) 500 MG CAPS Take 1 capsule by mouth daily.    . traMADol (ULTRAM) 50 MG tablet Take 1 tablet (50 mg total) by mouth every 6 (six) hours as needed for pain. (Patient not taking: Reported on 07/28/2017) 15 tablet 0   No current facility-administered medications for this visit.     ROS: See HPI for pertinent positives and negatives.   Physical Examination  Vitals:   07/28/17 1104  BP: 123/72  Pulse: 75  Resp: 16  Temp: 98.1 F (36.7 C)  SpO2: 100%  Weight: 202 lb (91.6 kg)  Height: 5\' 9"  (1.753 m)   Body mass index is 29.83 kg/m.  General: A&O x 3, WDWN, obese female. Gait: normal HENT: No gross abnormalities.  Eyes: PERRLA. Pulmonary: Respirations are non labored, CTAB, good air movement Cardiac: regular rhythm, no detected murmur.         Carotid Bruits Right Left   Negative Negative   Radial pulses are 2+ palpable bilaterally   Adominal aortic pulse is not palpable                         VASCULAR EXAM: Extremities without ischemic changes, without Gangrene; open wounds. Bilateral pretibial and ankles with 1+ pitting and non pitting edema. Hemosiderin deposits in both lower legs.                                                                                                                                                        LE Pulses Right Left       FEMORAL  2+ palpable  2+ palpable        POPLITEAL  not palpable   not palpable       POSTERIOR TIBIAL  not palpable   not palpable        DORSALIS PEDIS      ANTERIOR TIBIAL not palpable  2+ palpable    Abdomen: soft, NT, large immobile periumbilical mass.  Skin: no rashes, no cellulitis, no ulcers noted. Musculoskeletal: no muscle wasting or atrophy.      Neurologic: A&O X 3; appropriate affect, Sensation is normal; MOTOR FUNCTION:  moving all extremities equally, motor  strength  5/5 throughout. Speech is fluent/normal. CN 2-12 intact. Psychiatric: Thought content is normal, mood appropriate for clinical situation   ASSESSMENT: Gloria Murphy is a 82 y.o. female who presents with normal bilateral ABI's, and pain in her low back and occasional sciatic pain, with history of known low back problems.  Her walking is limited by low back issues and arthritis pain.  There are no signs of ischemia in her feet or legs.   She has some mild/moderate chronic venous insufficiency; wearing her knee high compression hose decreases the swelling in her lower legs.    The only abdominal surgery she has had is an abdominal hysterectomy years ago, states she still has her ovaries. Large immobile periumbilical mass palpated, not a hernia. Pt denies abdominal pain.   CTA abd/pelvis performed yesterday found a large complex solid mass arising from the midline of the pelvis measuring 21.3 cm from superior to inferior dimension, 17.2 cm from right to left dimension, and 15.3 cm from anterior to posterior dimension. The appearance of this mass suggests that it may represent a large uterine leiomyoma with possible sarcomatous degeneration. Given the history of previous hysterectomy, an atypical presentation of ovarian neoplasm must be a differential consideration. Given concern for potential neoplasm or neoplastic degeneration, gynecologic consultation advised.  A small amount of fluid is noted between this mass in the rectum which may well either have sympathetic or post inflammatory etiology. This mass impresses upon the superior aspect of the urinary bladder but does not invade the bladder. Note that normal ovaries are not visualized.     DATA  CTA abd/pelvis with contrast (07-27-17): IMPRESSION: 1. Complex solid mass arising from the midline of the pelvis measuring 21.3 cm from superior to inferior dimension, 17.2 cm from right to left dimension, and 15.3 cm from  anterior to posterior dimension. The appearance of this mass suggests that it may represent a large uterine leiomyoma with possible sarcomatous degeneration. Given the history of previous hysterectomy, an atypical presentation of ovarian neoplasm must be a differential consideration. Given concern for potential neoplasm or neoplastic degeneration, gynecologic consultation advised. A small amount of fluid is noted between this mass in the rectum which may well either have sympathetic or post inflammatory etiology. This mass impresses upon the superior aspect of the urinary bladder but does not invade the bladder. Note that normal ovaries are not visualized.  2.  No liver lesions are evident.  No adenopathy is appreciable.  3. No bowel obstruction. No abscess in the abdomen or pelvis evident.  4. No renal or ureteral calculus. Mild fullness of each collecting system is felt to be due to extrinsic impression on the ureters due to this large midline pelvic mass.    Bilateral LE Arterial Duplex (07/13/17): No evidence of arterial occlusive disease in both legs.   ABI (Date: 07/13/2017):  R:  ? ABI: 1.09 (was 1.08 on 01-12-17),  ? PT: bi ? DP: tri ? TBI:  0.68 (was 0.71)  L:  ? ABI: 1.12 (was 1.20),  ? PT: tri ? DP: tri ? TBI: 0.69 (was 0.73) ? ABI's remain normal with tri and biphasic waveforms.     PLAN:  Referral to gynecological oncologist ASAP to evaluate large abdominal mass.  I spoke with Joylene John, NP in Dr. Everitt Amber office in [place]; gave her pt information, one of their providers will see pt next week. I called pt and spoke to her and let her know this.  Based on the patient's vascular  studies and examination, pt will return to clinic in 6 months, no ABI's needed agon since they ave been normal. Will evaluate her mild to moderate venous insufficiency.   I discussed in depth with the patient the nature of atherosclerosis, and emphasized the importance of  maximal medical management including strict control of blood pressure, blood glucose, and lipid levels, obtaining regular exercise, and continued cessation of smoking.  The patient is aware that without maximal medical management the underlying atherosclerotic disease process will progress, limiting the benefit of any interventions.  The patient was given information about PAD including signs, symptoms, treatment, what symptoms should prompt the patient to seek immediate medical care, and risk reduction measures to take.  Clemon Chambers, RN, MSN, FNP-C [facility] Phone: [phone]  Clinic MD: Donzetta Matters  07/28/17 11:23 AM

## 2017-07-28 NOTE — Telephone Encounter (Signed)
Called and gave the patient appt for March 29th with Dr. Fermin Schwab.

## 2017-07-31 ENCOUNTER — Other Ambulatory Visit: Payer: Self-pay

## 2017-07-31 DIAGNOSIS — I739 Peripheral vascular disease, unspecified: Secondary | ICD-10-CM

## 2017-07-31 DIAGNOSIS — R0989 Other specified symptoms and signs involving the circulatory and respiratory systems: Secondary | ICD-10-CM

## 2017-07-31 DIAGNOSIS — I779 Disorder of arteries and arterioles, unspecified: Secondary | ICD-10-CM

## 2017-08-01 DIAGNOSIS — I10 Essential (primary) hypertension: Secondary | ICD-10-CM | POA: Diagnosis not present

## 2017-08-01 DIAGNOSIS — M130 Polyarthritis, unspecified: Secondary | ICD-10-CM | POA: Diagnosis not present

## 2017-08-01 DIAGNOSIS — E785 Hyperlipidemia, unspecified: Secondary | ICD-10-CM | POA: Diagnosis not present

## 2017-08-01 DIAGNOSIS — R739 Hyperglycemia, unspecified: Secondary | ICD-10-CM | POA: Diagnosis not present

## 2017-08-04 ENCOUNTER — Encounter: Payer: Self-pay | Admitting: Gynecology

## 2017-08-04 ENCOUNTER — Inpatient Hospital Stay: Payer: Medicare Other | Attending: Gynecology | Admitting: Gynecology

## 2017-08-04 VITALS — BP 159/69 | HR 72 | Temp 97.9°F | Resp 18 | Wt 204.9 lb

## 2017-08-04 DIAGNOSIS — Z90710 Acquired absence of both cervix and uterus: Secondary | ICD-10-CM | POA: Diagnosis not present

## 2017-08-04 DIAGNOSIS — R1900 Intra-abdominal and pelvic swelling, mass and lump, unspecified site: Secondary | ICD-10-CM | POA: Diagnosis not present

## 2017-08-04 NOTE — Patient Instructions (Signed)
Preparing for your Surgery  Plan for surgery on August 31, 2017 with Dr. Everitt Amber at [place] will be scheduled for an exploratory laparotomy, removal of pelvic mass, possible staging if cancer identified.  Pre-operative Testing -You will receive a phone call from presurgical testing at [facility] to arrange for a pre-operative testing appointment before your surgery.  This appointment normally occurs one to two weeks before your scheduled surgery.   -Bring your insurance card, copy of an advanced directive if applicable, medication list  -At that visit, you will be asked to sign a consent for a possible blood transfusion in case a transfusion becomes necessary during surgery.  The need for a blood transfusion is rare but having consent is a necessary part of your care.    -You should not be taking blood thinners or aspirin at least ten days prior to surgery unless instructed by your surgeon.  Day Before Surgery at [place] will be asked to take in a light diet the day before surgery.  Avoid carbonated beverages.  You will be advised to have nothing to eat or drink after midnight the evening before.    Eat a light diet the day before surgery.  Examples including soups, broths, toast, yogurt, mashed potatoes.  Things to avoid include carbonated beverages (fizzy beverages), raw fruits and raw vegetables, or beans.   If your bowels are filled with gas, your surgeon will have difficulty visualizing your pelvic organs which increases your surgical risks.  Your role in recovery Your role is to become active as soon as directed by your doctor, while still giving yourself time to heal.  Rest when you feel tired. You will be asked to do the following in order to speed your recovery:  - Cough and breathe deeply. This helps toclear and expand your lungs and can prevent pneumonia. You may be given a spirometer to practice deep breathing. A staff member will show  you how to use the spirometer. - Do mild physical activity. Walking or moving your legs help your circulation and body functions return to normal. A staff member will help you when you try to walk and will provide you with simple exercises. Do not try to get up or walk alone the first time. - Actively manage your pain. Managing your pain lets you move in comfort. We will ask you to rate your pain on a scale of zero to 10. It is your responsibility to tell your doctor or nurse where and how much you hurt so your pain can be treated.  Special Considerations -If you are diabetic, you may be placed on insulin after surgery to have closer control over your blood sugars to promote healing and recovery.  This does not mean that you will be discharged on insulin.  If applicable, your oral antidiabetics will be resumed when you are tolerating a solid diet.  -Your final pathology results from surgery should be available by the Friday after surgery and the results will be relayed to you when available.  -Dr. Lahoma Crocker is the Surgeon that assists your GYN Oncologist with surgery.  The next day after your surgery you will either see your GYN Oncologist or Dr. Lahoma Crocker.   Blood Transfusion Information WHAT IS A BLOOD TRANSFUSION? A transfusion is the replacement of blood or some of its parts. Blood is made up of multiple cells which provide different functions.  Red blood cells carry oxygen and are used for blood  loss replacement.  White blood cells fight against infection.  Platelets control bleeding.  Plasma helps clot blood.  Other blood products are available for specialized needs, such as hemophilia or other clotting disorders. BEFORE THE TRANSFUSION  Who gives blood for transfusions?   You may be able to donate blood to be used at a later date on yourself (autologous donation).  Relatives can be asked to donate blood. This is generally not any safer than if you have received  blood from a stranger. The same precautions are taken to ensure safety when a relative's blood is donated.  Healthy volunteers who are fully evaluated to make sure their blood is safe. This is blood bank blood. Transfusion therapy is the safest it has ever been in the practice of medicine. Before blood is taken from a donor, a complete history is taken to make sure that person has no history of diseases nor engages in risky social behavior (examples are intravenous drug use or sexual activity with multiple partners). The donor's travel history is screened to minimize risk of transmitting infections, such as malaria. The donated blood is tested for signs of infectious diseases, such as HIV and hepatitis. The blood is then tested to be sure it is compatible with you in order to minimize the chance of a transfusion reaction. If you or a relative donates blood, this is often done in anticipation of surgery and is not appropriate for emergency situations. It takes many days to process the donated blood. RISKS AND COMPLICATIONS Although transfusion therapy is very safe and saves many lives, the main dangers of transfusion include:   Getting an infectious disease.  Developing a transfusion reaction. This is an allergic reaction to something in the blood you were given. Every precaution is taken to prevent this. The decision to have a blood transfusion has been considered carefully by your caregiver before blood is given. Blood is not given unless the benefits outweigh the risks.

## 2017-08-04 NOTE — H&P (View-Only) (Signed)
Consult Note: Gyn-Onc   Gloria Murphy 82 y.o. female  Chief Complaint  Patient presents with  . Pelvic mass    Assessment : Large complex abdominal pelvic mass.  Plan: Given the size and characteristics of this mass I would recommend the patient undergo exploratory laparotomy and resection of the mass.  Intraoperative frozen section to determine whether surgical staging be necessary.  Patient and her husband are in agreement with this plan which we will schedule to be performed at [facility] on April 25.  Questions are answered.  Risk of surgery discussed.  HPI: 82 year old African-American female seen regarding management of a newly diagnosed abdominal pelvic mass.  Apparently in the course of evaluation for venous problems an abdominal exam revealed this mass.  The patient reports that she really just thought she was getting fatter.  She denies any abdominal pain or pressure or any urinary frequency.  CT scan was obtained revealing a 21 x 17 x 15 cm abdominal pelvic mass which is complex in nature and not cystic.[name]  There is minimal amount of ascites no evidence of adenopathy or any other metastatic disease.  Patient has a past history hysterectomy for menorrhagia.  She denies any family history of breast or ovarian cancer.  Review of Systems:10 point review of systems is negative except as noted in interval history.   Vitals: Blood pressure (!) 159/69, pulse 72, temperature 97.9 F (36.6 C), temperature source Oral, resp. rate 18, weight 204 lb 14.4 oz (92.9 kg), SpO2 100 %.  Physical Exam: General : The patient is a healthy woman in no acute distress.  HEENT: normocephalic, extraoccular movements normal; neck is supple without thyromegally  Lynphnodes: Supraclavicular and inguinal nodes not enlarged  Abdomen: Soft, nontender.  There is a palpable mass extending approximately 8 cm above the umbilicus.  There is no evidence of ascites. Pelvic:  EGBUS: Normal female   Vagina: Normal, no lesions  Urethra and Bladder: Normal, non-tender  Cervix: Surgically absent  Uterus: Surgically absent  Bi-manual examination: Non-tender; no adenxal masses or nodularity the mass is high in the pelvis. Rectal: normal sphincter tone, no masses, no blood  Lower extremities: No edema or varicosities. Normal range of motion      No Known Allergies  Past Medical History:  Diagnosis Date  . Diabetes mellitus without complication ([place])   . High cholesterol   . Hypertension     Past Surgical History:  Procedure Laterality Date  . ABDOMINAL HYSTERECTOMY      Current Outpatient Medications  Medication Sig Dispense Refill  . Calcium Carbonate (CALTRATE 600 PO) Take 1 tablet by mouth daily.    . ferrous sulfate 325 (65 FE) MG tablet Take 325 mg by mouth daily with breakfast.    . glimepiride (AMARYL) 1 MG tablet TK 1 T PO D FOR DIABETES  0  . Multiple Vitamin (MULTIVITAMIN) tablet Take 1 tablet by mouth daily.    . Olmesartan-amLODIPine-HCTZ 40-5-12.5 MG TABS Take 1 tablet orally daily  0  . OVER THE COUNTER MEDICATION Take 1 tablet by mouth daily. Potassium.    . pioglitazone (ACTOS) 15 MG tablet Take 1 tablet daily  0  . rosuvastatin (CRESTOR) 20 MG tablet Take 20 mg by mouth daily.    . Skin Protectants, Misc. (EUCERIN) cream Apply 1 application topically as needed for dry skin.    [name] HYDROcodone-acetaminophen (NORCO) 5-325 MG per tablet Take 1 tablet by mouth every 4 (four) hours as needed for pain. (Patient not  taking: Reported on 08/04/2017) 30 tablet 0  . Omega-3 Fatty Acids (FISH OIL) 500 MG CAPS Take 1 capsule by mouth daily.     No current facility-administered medications for this visit.     Social History   Socioeconomic History  . Marital status: Widowed    Spouse name: Not on file  . Number of children: Not on file  . Years of education: Not on file  . Highest education level: Not on file  Occupational History  . Not on file  Social Needs  .  Financial resource strain: Not on file  . Food insecurity:    Worry: Not on file    Inability: Not on file  . Transportation needs:    Medical: Not on file    Non-medical: Not on file  Tobacco Use  . Smoking status: Never Smoker  . Smokeless tobacco: Never Used  Substance and Sexual Activity  . Alcohol use: No  . Drug use: No  . Sexual activity: Not on file  Lifestyle  . Physical activity:    Days per week: Not on file    Minutes per session: Not on file  . Stress: Not on file  Relationships  . Social connections:    Talks on phone: Not on file    Gets together: Not on file    Attends religious service: Not on file    Active member of club or organization: Not on file    Attends meetings of clubs or organizations: Not on file    Relationship status: Not on file  . Intimate partner violence:    Fear of current or ex partner: Not on file    Emotionally abused: Not on file    Physically abused: Not on file    Forced sexual activity: Not on file  Other Topics Concern  . Not on file  Social History Narrative  . Not on file    History reviewed. No pertinent family history.    Marti Sleigh, MD 08/04/2017, 2:20 PM

## 2017-08-04 NOTE — Progress Notes (Signed)
Consult Note: Gyn-Onc   Gloria Murphy 82 y.o. female  Chief Complaint  Patient presents with  . Pelvic mass    Assessment : Large complex abdominal pelvic mass.  Plan: Given the size and characteristics of this mass I would recommend the patient undergo exploratory laparotomy and resection of the mass.  Intraoperative frozen section to determine whether surgical staging be necessary.  Patient and her husband are in agreement with this plan which we will schedule to be performed at [facility] on April 25.  Questions are answered.  Risk of surgery discussed.  HPI: 82 year old African-American female seen regarding management of a newly diagnosed abdominal pelvic mass.  Apparently in the course of evaluation for venous problems an abdominal exam revealed this mass.  The patient reports that she really just thought she was getting fatter.  She denies any abdominal pain or pressure or any urinary frequency.  CT scan was obtained revealing a 21 x 17 x 15 cm abdominal pelvic mass which is complex in nature and not cystic.Gloria Murphy  There is minimal amount of ascites no evidence of adenopathy or any other metastatic disease.  Patient has a past history hysterectomy for menorrhagia.  She denies any family history of breast or ovarian cancer.  Review of Systems:10 point review of systems is negative except as noted in interval history.   Vitals: Blood pressure (!) 159/69, pulse 72, temperature 97.9 F (36.6 C), temperature source Oral, resp. rate 18, weight 204 lb 14.4 oz (92.9 kg), SpO2 100 %.  Physical Exam: General : The patient is a healthy woman in no acute distress.  HEENT: normocephalic, extraoccular movements normal; neck is supple without thyromegally  Lynphnodes: Supraclavicular and inguinal nodes not enlarged  Abdomen: Soft, nontender.  There is a palpable mass extending approximately 8 cm above the umbilicus.  There is no evidence of ascites. Pelvic:  EGBUS: Normal female   Vagina: Normal, no lesions  Urethra and Bladder: Normal, non-tender  Cervix: Surgically absent  Uterus: Surgically absent  Bi-manual examination: Non-tender; no adenxal masses or nodularity the mass is high in the pelvis. Rectal: normal sphincter tone, no masses, no blood  Lower extremities: No edema or varicosities. Normal range of motion      No Known Allergies  Past Medical History:  Diagnosis Date  . Diabetes mellitus without complication ([place])   . High cholesterol   . Hypertension     Past Surgical History:  Procedure Laterality Date  . ABDOMINAL HYSTERECTOMY      Current Outpatient Medications  Medication Sig Dispense Refill  . Calcium Carbonate (CALTRATE 600 PO) Take 1 tablet by mouth daily.    . ferrous sulfate 325 (65 FE) MG tablet Take 325 mg by mouth daily with breakfast.    . glimepiride (AMARYL) 1 MG tablet TK 1 T PO D FOR DIABETES  0  . Multiple Vitamin (MULTIVITAMIN) tablet Take 1 tablet by mouth daily.    . Olmesartan-amLODIPine-HCTZ 40-5-12.5 MG TABS Take 1 tablet orally daily  0  . OVER THE COUNTER MEDICATION Take 1 tablet by mouth daily. Potassium.    . pioglitazone (ACTOS) 15 MG tablet Take 1 tablet daily  0  . rosuvastatin (CRESTOR) 20 MG tablet Take 20 mg by mouth daily.    . Skin Protectants, Misc. (EUCERIN) cream Apply 1 application topically as needed for dry skin.    Gloria Murphy HYDROcodone-acetaminophen (NORCO) 5-325 MG per tablet Take 1 tablet by mouth every 4 (four) hours as needed for pain. (Patient not  taking: Reported on 08/04/2017) 30 tablet 0  . Omega-3 Fatty Acids (FISH OIL) 500 MG CAPS Take 1 capsule by mouth daily.     No current facility-administered medications for this visit.     Social History   Socioeconomic History  . Marital status: Widowed    Spouse name: Not on file  . Number of children: Not on file  . Years of education: Not on file  . Highest education level: Not on file  Occupational History  . Not on file  Social Needs  .  Financial resource strain: Not on file  . Food insecurity:    Worry: Not on file    Inability: Not on file  . Transportation needs:    Medical: Not on file    Non-medical: Not on file  Tobacco Use  . Smoking status: Never Smoker  . Smokeless tobacco: Never Used  Substance and Sexual Activity  . Alcohol use: No  . Drug use: No  . Sexual activity: Not on file  Lifestyle  . Physical activity:    Days per week: Not on file    Minutes per session: Not on file  . Stress: Not on file  Relationships  . Social connections:    Talks on phone: Not on file    Gets together: Not on file    Attends religious service: Not on file    Active member of club or organization: Not on file    Attends meetings of clubs or organizations: Not on file    Relationship status: Not on file  . Intimate partner violence:    Fear of current or ex partner: Not on file    Emotionally abused: Not on file    Physically abused: Not on file    Forced sexual activity: Not on file  Other Topics Concern  . Not on file  Social History Narrative  . Not on file    History reviewed. No pertinent family history.    Gloria Sleigh, MD 08/04/2017, 2:20 PM

## 2017-08-24 NOTE — Patient Instructions (Addendum)
Gloria Murphy  08/24/2017   Your procedure is scheduled on: 08-31-17   Report to [facility] Main  Entrance Report to admitting at 9:15 AM   Eat a light diet the day before surgery.  Examples including soups, broths, toast, yogurt, mashed potatoes.  Things to avoid include carbonated beverages (fizzy beverages), raw fruits and raw vegetables, or beans.   If your bowels are filled with gas, your surgeon will have difficulty visualizing your pelvic organs which increases your surgical risks. At 4PM start your prep and begin a Clear Liquid Diet.   NO SOLID FOOD AFTER MIDNIGHT THE NIGHT PRIOR TO SURGERY. NOTHING BY MOUTH EXCEPT CLEAR LIQUIDS UNTIL 3 HOURS PRIOR TO [place] SURGERY. PLEASE FINISH ENSURE DRINK PER SURGEON ORDER 3 HOURS PRIOR TO SCHEDULED SURGERY TIME WHICH NEEDS TO BE COMPLETED AT 8:45 AM.     CLEAR LIQUID DIET   Foods Allowed                                                                     Foods Excluded  Coffee and tea, regular and decaf                             liquids that you cannot  Plain Jell-O in any flavor                                             see through such as: Fruit ices (not with fruit pulp)                                     milk, soups, orange juice  Iced Popsicles                                    All solid food Carbonated beverages, regular and diet                                    Cranberry, grape and apple juices Sports drinks like Gatorade Lightly seasoned clear broth or consume(fat free) Sugar, honey syrup  Sample Menu Breakfast                                Lunch                                     Supper Cranberry juice                    Beef broth  Chicken broth Jell-O                                     Grape juice                           Apple juice Coffee or tea                        Jell-O                                      Popsicle                                                 Coffee or tea                        Coffee or tea Call this number if you have problems the morning of surgery [phone]   _____________________________________________________________________     Take these medicines the morning of surgery with A SIP OF WATER: None. You may bring an use your eyedrops as needed.  DO NOT TAKE ANY DIABETIC MEDICATIONS DAY OF YOUR SURGERY                                You may not have any metal on your body including hair pins and              piercings  Do not wear jewelry, make-up, lotions, powders or perfumes, deodorant             Do not wear nail polish.  Do not shave  48 hours prior to surgery.                Do not bring valuables to the hospital. [place].  Contacts, dentures or bridgework may not be worn into surgery.  Leave suitcase in the car. After surgery it may be brought to your room.                Please read over the following fact sheets you were given: _____________________________________________________________________ How to Manage Your Diabetes Before and After Surgery  Why is it important to control my blood sugar before and after surgery? . Improving blood sugar levels before and after surgery helps healing and can limit problems. . A way of improving blood sugar control is eating a healthy diet by: o  Eating less sugar and carbohydrates o  Increasing activity/exercise o  Talking with your doctor about reaching your blood sugar goals . High blood sugars (greater than 180 mg/dL) can raise your risk of infections and slow your recovery, so you will need to focus on controlling your diabetes during the weeks before surgery. . Make sure that the doctor who takes care of your diabetes knows about your planned surgery including the date and location.  How do I manage my blood sugar before surgery? . Check your blood sugar at  least 4 times a day, starting 2 days  before surgery, to make sure that the level is not too high or low. o Check your blood sugar the morning of your surgery when you wake up and every 2 hours until you get to the Short Stay unit. . If your blood sugar is less than 70 mg/dL, you will need to treat for low blood sugar: o Do not take insulin. o Treat a low blood sugar (less than 70 mg/dL) with  cup of clear juice (cranberry or apple), 4 glucose tablets, OR glucose gel. o Recheck blood sugar in 15 minutes after treatment (to make sure it is greater than 70 mg/dL). If your blood sugar is not greater than 70 mg/dL on recheck, call [phone] for further instructions. . Report your blood sugar to the short stay nurse when you get to Short Stay.  . If you are admitted to the hospital after surgery: o Your blood sugar will be checked by the staff and you will probably be given insulin after surgery (instead of oral diabetes medicines) to make sure you have good blood sugar levels. o The goal for blood sugar control after surgery is 80-180 mg/dL.   WHAT DO I DO ABOUT MY DIABETES MEDICATION?  [name] Do not take oral diabetes medicines (pills) the morning of surgery.  THE DAY BEFORE SURGERY, take only your morning dose of Glimepiride (Amaryl),and your usual Actos     Reviewed and Endorsed by [facility] Patient Education Committee, August 2015            [facility] - Preparing for Surgery Before surgery, you can play an important role.  Because skin is not sterile, your skin needs to be as free of germs as possible.  You can reduce the number of germs on your skin by washing with CHG (chlorahexidine gluconate) soap before surgery.  CHG is an antiseptic cleaner which kills germs and bonds with the skin to continue killing germs even after washing. Please DO NOT use if you have an allergy to CHG or antibacterial soaps.  If your skin becomes reddened/irritated stop using the CHG and inform your nurse when you arrive at Short Stay. Do not  shave (including legs and underarms) for at least 48 hours prior to the first CHG shower.  You may shave your face/neck. Please follow these instructions carefully:  1.  Shower with CHG Soap the night before surgery and the  morning of Surgery.  2.  If you choose to wash your hair, wash your hair first as usual with your  normal  shampoo.  3.  After you shampoo, rinse your hair and body thoroughly to remove the  shampoo.                           4.  Use CHG as you would any other liquid soap.  You can apply chg directly  to the skin and wash                       Gently with a scrungie or clean washcloth.  5.  Apply the CHG Soap to your body ONLY FROM THE NECK DOWN.   Do not use on face/ open                           Wound or open sores. Avoid contact with eyes, ears mouth and  genitals (private parts).                       Wash face,  Genitals (private parts) with your normal soap.             6.  Wash thoroughly, paying special attention to the area where your surgery  will be performed.  7.  Thoroughly rinse your body with warm water from the neck down.  8.  DO NOT shower/wash with your normal soap after using and rinsing off  the CHG Soap.                9.  Pat yourself dry with a clean towel.            10.  Wear clean pajamas.            11.  Place clean sheets on your bed the night of your first shower and do not  sleep with pets. Day of Surgery : Do not apply any lotions/deodorants the morning of surgery.  Please wear clean clothes to the hospital/surgery center.  FAILURE TO FOLLOW THESE INSTRUCTIONS MAY RESULT IN THE CANCELLATION OF YOUR SURGERY PATIENT SIGNATURE_________________________________  NURSE SIGNATURE__________________________________  ________________________________________________________________________   Adam Phenix  An incentive spirometer is a tool that can help keep your lungs clear and active. This tool measures how well you are filling your lungs  with each breath. Taking long deep breaths may help reverse or decrease the chance of developing breathing (pulmonary) problems (especially infection) following:  A long period of time when you are unable to move or be active. BEFORE THE PROCEDURE   If the spirometer includes an indicator to show your best effort, your nurse or respiratory therapist will set it to a desired goal.  If possible, sit up straight or lean slightly forward. Try not to slouch.  Hold the incentive spirometer in an upright position. INSTRUCTIONS FOR USE  1. Sit on the edge of your bed if possible, or sit up as far as you can in bed or on a chair. 2. Hold the incentive spirometer in an upright position. 3. Breathe out normally. 4. Place the mouthpiece in your mouth and seal your lips tightly around it. 5. Breathe in slowly and as deeply as possible, raising the piston or the ball toward the top of the column. 6. Hold your breath for 3-5 seconds or for as long as possible. Allow the piston or ball to fall to the bottom of the column. 7. Remove the mouthpiece from your mouth and breathe out normally. 8. Rest for a few seconds and repeat Steps 1 through 7 at least 10 times every 1-2 hours when you are awake. Take your time and take a few normal breaths between deep breaths. 9. The spirometer may include an indicator to show your best effort. Use the indicator as a goal to work toward during each repetition. 10. After each set of 10 deep breaths, practice coughing to be sure your lungs are clear. If you have an incision (the cut made at the time of surgery), support your incision when coughing by placing a pillow or rolled up towels firmly against it. Once you are able to get out of bed, walk around indoors and cough well. You may stop using the incentive spirometer when instructed by your caregiver.  RISKS AND COMPLICATIONS  Take your time so you do not get dizzy or light-headed.  If you are in pain, you may  need to  take or ask for pain medication before doing incentive spirometry. It is harder to take a deep breath if you are having pain. AFTER USE  Rest and breathe slowly and easily.  It can be helpful to keep track of a log of your progress. Your caregiver can provide you with a simple table to help with this. If you are using the spirometer at home, follow these instructions: [place] IF:   You are having difficultly using the spirometer.  You have trouble using the spirometer as often as instructed.  Your pain medication is not giving enough relief while using the spirometer.  You develop fever of 100.5 F (38.1 C) or higher. SEEK IMMEDIATE MEDICAL CARE IF:   You cough up bloody sputum that had not been present before.  You develop fever of 102 F (38.9 C) or greater.  You develop worsening pain at or near the incision site. MAKE SURE YOU:   Understand these instructions.  Will watch your condition.  Will get help right away if you are not doing well or get worse. Document Released: 09/05/2006 Document Revised: 07/18/2011 Document Reviewed: 11/06/2006 Exit[employer] Patient Information 2014 ExitCare, [place].   ________________________________________________________________________  WHAT IS A BLOOD TRANSFUSION? Blood Transfusion Information  A transfusion is the replacement of blood or some of its parts. Blood is made up of multiple cells which provide different functions.  Red blood cells carry oxygen and are used for blood loss replacement.  White blood cells fight against infection.  Platelets control bleeding.  Plasma helps clot blood.  Other blood products are available for specialized needs, such as hemophilia or other clotting disorders. BEFORE THE TRANSFUSION  Who gives blood for transfusions?   Healthy volunteers who are fully evaluated to make sure their blood is safe. This is blood bank blood. Transfusion therapy is the safest it has ever been in the  practice of medicine. Before blood is taken from a donor, a complete history is taken to make sure that person has no history of diseases nor engages in risky social behavior (examples are intravenous drug use or sexual activity with multiple partners). The donor's travel history is screened to minimize risk of transmitting infections, such as malaria. The donated blood is tested for signs of infectious diseases, such as HIV and hepatitis. The blood is then tested to be sure it is compatible with you in order to minimize the chance of a transfusion reaction. If you or a relative donates blood, this is often done in anticipation of surgery and is not appropriate for emergency situations. It takes many days to process the donated blood. RISKS AND COMPLICATIONS Although transfusion therapy is very safe and saves many lives, the main dangers of transfusion include:   Getting an infectious disease.  Developing a transfusion reaction. This is an allergic reaction to something in the blood you were given. Every precaution is taken to prevent this. The decision to have a blood transfusion has been considered carefully by your caregiver before blood is given. Blood is not given unless the benefits outweigh the risks. AFTER THE TRANSFUSION  Right after receiving a blood transfusion, you will usually feel much better and more energetic. This is especially true if your red blood cells have gotten low (anemic). The transfusion raises the level of the red blood cells which carry oxygen, and this usually causes an energy increase.  The nurse administering the transfusion will monitor you carefully for complications. HOME CARE INSTRUCTIONS  No special  instructions are needed after a transfusion. You may find your energy is better. Speak with your caregiver about any limitations on activity for underlying diseases you may have. SEEK MEDICAL CARE IF:   Your condition is not improving after your transfusion.  You  develop redness or irritation at the intravenous (IV) site. SEEK IMMEDIATE MEDICAL CARE IF:  Any of the following symptoms occur over the next 12 hours:  Shaking chills.  You have a temperature by mouth above 102 F (38.9 C), not controlled by medicine.  Chest, back, or muscle pain.  People around you feel you are not acting correctly or are confused.  Shortness of breath or difficulty breathing.  Dizziness and fainting.  You get a rash or develop hives.  You have a decrease in urine output.  Your urine turns a dark color or changes to pink, red, or brown. Any of the following symptoms occur over the next 10 days:  You have a temperature by mouth above 102 F (38.9 C), not controlled by medicine.  Shortness of breath.  Weakness after normal activity.  The white part of the eye turns yellow (jaundice).  You have a decrease in the amount of urine or are urinating less often.  Your urine turns a dark color or changes to pink, red, or brown. Document Released: 04/22/2000 Document Revised: 07/18/2011 Document Reviewed: 12/10/2007 [facility] Patient Information 2014 [place], [place].  _______________________________________________________________________

## 2017-08-28 ENCOUNTER — Encounter (HOSPITAL_COMMUNITY): Payer: Self-pay

## 2017-08-28 ENCOUNTER — Encounter (HOSPITAL_COMMUNITY)
Admission: RE | Admit: 2017-08-28 | Discharge: 2017-08-28 | Disposition: A | Discharge status: Home / Self Care | Payer: Medicare Other | Source: Ambulatory Visit | Attending: Gynecologic Oncology | Admitting: Gynecologic Oncology

## 2017-08-28 ENCOUNTER — Other Ambulatory Visit: Payer: Self-pay

## 2017-08-28 DIAGNOSIS — I10 Essential (primary) hypertension: Secondary | ICD-10-CM | POA: Diagnosis not present

## 2017-08-28 DIAGNOSIS — Z7984 Long term (current) use of oral hypoglycemic drugs: Secondary | ICD-10-CM | POA: Diagnosis not present

## 2017-08-28 DIAGNOSIS — C562 Malignant neoplasm of left ovary: Secondary | ICD-10-CM | POA: Diagnosis not present

## 2017-08-28 DIAGNOSIS — Z79899 Other long term (current) drug therapy: Secondary | ICD-10-CM | POA: Diagnosis not present

## 2017-08-28 DIAGNOSIS — E119 Type 2 diabetes mellitus without complications: Secondary | ICD-10-CM | POA: Diagnosis not present

## 2017-08-28 DIAGNOSIS — E7800 Pure hypercholesterolemia, unspecified: Secondary | ICD-10-CM | POA: Diagnosis not present

## 2017-08-28 LAB — COMPREHENSIVE METABOLIC PANEL
ALK PHOS: 55 U/L (ref 38–126)
ALT: 12 U/L — ABNORMAL LOW (ref 14–54)
ANION GAP: 9 (ref 5–15)
AST: 18 U/L (ref 15–41)
Albumin: 3.7 g/dL (ref 3.5–5.0)
BUN: 11 mg/dL (ref 6–20)
CALCIUM: 9.4 mg/dL (ref 8.9–10.3)
CO2: 25 mmol/L (ref 22–32)
Chloride: 105 mmol/L (ref 101–111)
Creatinine, Ser: 0.84 mg/dL (ref 0.44–1.00)
GFR calc non Af Amer: >60 mL/min (ref >60)
Glucose, Bld: 142 mg/dL — ABNORMAL HIGH (ref 65–99)
Potassium: 3.7 mmol/L (ref 3.5–5.1)
SODIUM: 139 mmol/L (ref 135–145)
Total Bilirubin: 0.8 mg/dL (ref 0.3–1.2)
Total Protein: 8.1 g/dL (ref 6.5–8.1)

## 2017-08-28 LAB — URINALYSIS, ROUTINE W REFLEX MICROSCOPIC
Bilirubin Urine: NEGATIVE
Glucose, UA: NEGATIVE mg/dL
HGB URINE DIPSTICK: NEGATIVE
Ketones, ur: NEGATIVE mg/dL
NITRITE: NEGATIVE
Protein, ur: NEGATIVE mg/dL
Specific Gravity, Urine: 1.025 (ref 1.005–1.030)
pH: 5 (ref 5.0–8.0)

## 2017-08-28 LAB — HEMOGLOBIN A1C
HEMOGLOBIN A1C: 5.4 % (ref 4.8–5.6)
MEAN PLASMA GLUCOSE: 108.28 mg/dL

## 2017-08-28 LAB — CBC
HCT: 37.7 % (ref 36.0–46.0)
HEMOGLOBIN: 12.3 g/dL (ref 12.0–15.0)
MCH: 27.6 pg (ref 26.0–34.0)
MCHC: 32.6 g/dL (ref 30.0–36.0)
MCV: 84.5 fL (ref 78.0–100.0)
PLATELETS: 156 10*3/uL (ref 150–400)
RBC: 4.46 MIL/uL (ref 3.87–5.11)
RDW: 13.6 % (ref 11.5–15.5)
WBC: 3.6 10*3/uL — ABNORMAL LOW (ref 4.0–10.5)

## 2017-08-28 LAB — GLUCOSE, CAPILLARY: GLUCOSE-CAPILLARY: 65 mg/dL (ref 65–99)

## 2017-08-28 LAB — ABO/RH: ABO/RH(D): B POS

## 2017-08-28 NOTE — Progress Notes (Addendum)
08-28-17 UA results & CA125 results routed to Dr. Denman George for review.

## 2017-08-29 LAB — CA 125: Cancer Antigen (CA) 125: 78.1 U/mL — ABNORMAL HIGH (ref 0.0–38.1)

## 2017-08-30 LAB — URINE CULTURE

## 2017-08-30 MED ORDER — SODIUM CHLORIDE 0.9 % IV SOLN
2.0000 g | INTRAVENOUS | Status: AC
  Administered 2017-08-31 (×2): 2 g via INTRAVENOUS
  Filled 2017-08-30: qty 2

## 2017-08-31 ENCOUNTER — Inpatient Hospital Stay (HOSPITAL_COMMUNITY): Payer: Medicare Other | Admitting: Registered Nurse

## 2017-08-31 ENCOUNTER — Other Ambulatory Visit: Payer: Self-pay

## 2017-08-31 ENCOUNTER — Encounter (HOSPITAL_COMMUNITY): Payer: Self-pay | Admitting: Gynecologic Oncology

## 2017-08-31 ENCOUNTER — Encounter (HOSPITAL_COMMUNITY)
Admission: RE | Disposition: A | Discharge status: Home / Self Care | Payer: Self-pay | Source: Ambulatory Visit | Attending: Gynecologic Oncology

## 2017-08-31 ENCOUNTER — Inpatient Hospital Stay (HOSPITAL_COMMUNITY)
Admission: RE | Admit: 2017-08-31 | Discharge: 2017-09-05 | DRG: 738 | Disposition: A | Discharge status: Home / Self Care | Payer: Medicare Other | Source: Ambulatory Visit | Attending: Gynecologic Oncology | Admitting: Gynecologic Oncology

## 2017-08-31 DIAGNOSIS — N736 Female pelvic peritoneal adhesions (postinfective): Secondary | ICD-10-CM | POA: Diagnosis not present

## 2017-08-31 DIAGNOSIS — Z79899 Other long term (current) drug therapy: Secondary | ICD-10-CM

## 2017-08-31 DIAGNOSIS — Z90710 Acquired absence of both cervix and uterus: Secondary | ICD-10-CM | POA: Diagnosis not present

## 2017-08-31 DIAGNOSIS — R971 Elevated cancer antigen 125 [CA 125]: Secondary | ICD-10-CM

## 2017-08-31 DIAGNOSIS — E7800 Pure hypercholesterolemia, unspecified: Secondary | ICD-10-CM | POA: Diagnosis present

## 2017-08-31 DIAGNOSIS — Z7984 Long term (current) use of oral hypoglycemic drugs: Secondary | ICD-10-CM | POA: Diagnosis not present

## 2017-08-31 DIAGNOSIS — I10 Essential (primary) hypertension: Secondary | ICD-10-CM | POA: Diagnosis present

## 2017-08-31 DIAGNOSIS — D3A098 Benign carcinoid tumors of other sites: Secondary | ICD-10-CM | POA: Diagnosis not present

## 2017-08-31 DIAGNOSIS — R1900 Intra-abdominal and pelvic swelling, mass and lump, unspecified site: Secondary | ICD-10-CM

## 2017-08-31 DIAGNOSIS — K219 Gastro-esophageal reflux disease without esophagitis: Secondary | ICD-10-CM

## 2017-08-31 DIAGNOSIS — C562 Malignant neoplasm of left ovary: Secondary | ICD-10-CM | POA: Diagnosis present

## 2017-08-31 DIAGNOSIS — E119 Type 2 diabetes mellitus without complications: Secondary | ICD-10-CM | POA: Diagnosis present

## 2017-08-31 DIAGNOSIS — R141 Gas pain: Secondary | ICD-10-CM

## 2017-08-31 DIAGNOSIS — E876 Hypokalemia: Secondary | ICD-10-CM

## 2017-08-31 DIAGNOSIS — R188 Other ascites: Secondary | ICD-10-CM | POA: Diagnosis not present

## 2017-08-31 HISTORY — PX: LAPAROTOMY: SHX154

## 2017-08-31 HISTORY — PX: MASS EXCISION: SHX2000

## 2017-08-31 LAB — TYPE AND SCREEN
ABO/RH(D): B POS
ANTIBODY SCREEN: NEGATIVE

## 2017-08-31 LAB — GLUCOSE, CAPILLARY
Glucose-Capillary: 165 mg/dL — ABNORMAL HIGH (ref 65–99)
Glucose-Capillary: 102 mg/dL — ABNORMAL HIGH (ref 65–99)

## 2017-08-31 SURGERY — LAPAROTOMY, EXPLORATORY
Anesthesia: General

## 2017-08-31 MED ORDER — FENTANYL CITRATE (PF) 250 MCG/5ML IJ SOLN
INTRAMUSCULAR | Status: AC
  Filled 2017-08-31: qty 5

## 2017-08-31 MED ORDER — SODIUM CHLORIDE 0.9 % IJ SOLN
INTRAMUSCULAR | Status: AC
  Filled 2017-08-31: qty 20

## 2017-08-31 MED ORDER — ONDANSETRON HCL 4 MG/2ML IJ SOLN
INTRAMUSCULAR | Status: AC
  Filled 2017-08-31: qty 2

## 2017-08-31 MED ORDER — MIDAZOLAM HCL 5 MG/5ML IJ SOLN
INTRAMUSCULAR | Status: DC | PRN
  Administered 2017-08-31: 0.5 mg via INTRAVENOUS

## 2017-08-31 MED ORDER — ALBUMIN HUMAN 5 % IV SOLN
INTRAVENOUS | Status: AC
  Filled 2017-08-31: qty 500

## 2017-08-31 MED ORDER — ONDANSETRON HCL 4 MG/2ML IJ SOLN
4.0000 mg | Freq: Four times a day (QID) | INTRAMUSCULAR | Status: DC | PRN
  Administered 2017-09-01 – 2017-09-02 (×4): 4 mg via INTRAVENOUS
  Filled 2017-08-31 (×4): qty 2

## 2017-08-31 MED ORDER — SUFENTANIL CITRATE 50 MCG/ML IV SOLN
INTRAVENOUS | Status: DC | PRN
  Administered 2017-08-31 (×3): 5 ug via INTRAVENOUS

## 2017-08-31 MED ORDER — METOCLOPRAMIDE HCL 5 MG/ML IJ SOLN: 10.0000 mg | Freq: Once | INTRAMUSCULAR | Status: DC | PRN

## 2017-08-31 MED ORDER — MEPERIDINE HCL 50 MG/ML IJ SOLN: 6.2500 mg | INTRAMUSCULAR | Status: DC | PRN

## 2017-08-31 MED ORDER — GABAPENTIN 300 MG PO CAPS
300.0000 mg | ORAL_CAPSULE | ORAL | Status: AC
  Administered 2017-08-31: 300 mg via ORAL
  Filled 2017-08-31: qty 1

## 2017-08-31 MED ORDER — DEXAMETHASONE SODIUM PHOSPHATE 10 MG/ML IJ SOLN
INTRAMUSCULAR | Status: AC
  Filled 2017-08-31: qty 1

## 2017-08-31 MED ORDER — MIDAZOLAM HCL 2 MG/2ML IJ SOLN
INTRAMUSCULAR | Status: AC
  Filled 2017-08-31: qty 2

## 2017-08-31 MED ORDER — ROSUVASTATIN CALCIUM 20 MG PO TABS
20.0000 mg | ORAL_TABLET | Freq: Every day | ORAL | Status: DC
  Administered 2017-09-01 – 2017-09-05 (×5): 20 mg via ORAL
  Filled 2017-08-31 (×5): qty 1

## 2017-08-31 MED ORDER — PREGABALIN 25 MG PO CAPS
25.0000 mg | ORAL_CAPSULE | Freq: Two times a day (BID) | ORAL | Status: DC
  Administered 2017-09-01 – 2017-09-05 (×8): 25 mg via ORAL
  Filled 2017-08-31 (×8): qty 1

## 2017-08-31 MED ORDER — CELECOXIB 200 MG PO CAPS
400.0000 mg | ORAL_CAPSULE | ORAL | Status: AC
  Administered 2017-08-31: 400 mg via ORAL
  Filled 2017-08-31: qty 2

## 2017-08-31 MED ORDER — DEXAMETHASONE SODIUM PHOSPHATE 4 MG/ML IJ SOLN: 4.0000 mg | INTRAMUSCULAR | Status: DC

## 2017-08-31 MED ORDER — IBUPROFEN 200 MG PO TABS
600.0000 mg | ORAL_TABLET | Freq: Four times a day (QID) | ORAL | Status: DC
  Administered 2017-09-01 – 2017-09-05 (×13): 600 mg via ORAL
  Filled 2017-08-31 (×16): qty 3

## 2017-08-31 MED ORDER — ACETAMINOPHEN 500 MG PO TABS
1000.0000 mg | ORAL_TABLET | ORAL | Status: AC
  Administered 2017-08-31: 1000 mg via ORAL
  Filled 2017-08-31: qty 2

## 2017-08-31 MED ORDER — SODIUM CHLORIDE 0.9 % IV SOLN
2.0000 g | Freq: Once | INTRAVENOUS | Status: DC
  Filled 2017-08-31: qty 2

## 2017-08-31 MED ORDER — ALBUMIN HUMAN 5 % IV SOLN
INTRAVENOUS | Status: DC | PRN
  Administered 2017-08-31 (×2): via INTRAVENOUS

## 2017-08-31 MED ORDER — LACTATED RINGERS IV SOLN
INTRAVENOUS | Status: DC
  Administered 2017-08-31 (×3): via INTRAVENOUS

## 2017-08-31 MED ORDER — HYDROMORPHONE HCL 1 MG/ML IJ SOLN
INTRAMUSCULAR | Status: DC | PRN
  Administered 2017-08-31: 0.5 mg via INTRAVENOUS

## 2017-08-31 MED ORDER — FENTANYL CITRATE (PF) 100 MCG/2ML IJ SOLN
INTRAMUSCULAR | Status: DC | PRN
  Administered 2017-08-31: 50 ug via INTRAVENOUS
  Administered 2017-08-31 (×2): 100 ug via INTRAVENOUS

## 2017-08-31 MED ORDER — SUGAMMADEX SODIUM 200 MG/2ML IV SOLN
INTRAVENOUS | Status: DC | PRN
  Administered 2017-08-31: 200 mg via INTRAVENOUS

## 2017-08-31 MED ORDER — AMLODIPINE BESYLATE 5 MG PO TABS
5.0000 mg | ORAL_TABLET | Freq: Every day | ORAL | Status: DC
  Administered 2017-09-01 – 2017-09-05 (×5): 5 mg via ORAL
  Filled 2017-08-31 (×5): qty 1

## 2017-08-31 MED ORDER — INSULIN GLARGINE 100 UNIT/ML ~~LOC~~ SOLN
18.0000 [IU] | Freq: Every day | SUBCUTANEOUS | Status: DC
  Administered 2017-08-31 – 2017-09-04 (×5): 18 [IU] via SUBCUTANEOUS
  Filled 2017-08-31 (×6): qty 0.18

## 2017-08-31 MED ORDER — KCL IN DEXTROSE-NACL 20-5-0.45 MEQ/L-%-% IV SOLN
INTRAVENOUS | Status: DC
  Administered 2017-08-31 – 2017-09-01 (×2): via INTRAVENOUS
  Filled 2017-08-31 (×2): qty 1000

## 2017-08-31 MED ORDER — BUPIVACAINE LIPOSOME 1.3 % IJ SUSP
20.0000 mL | Freq: Once | INTRAMUSCULAR | Status: AC
  Administered 2017-08-31: 20 mL
  Filled 2017-08-31: qty 20

## 2017-08-31 MED ORDER — CHEWING GUM (ORBIT) SUGAR FREE
1.0000 | CHEWING_GUM | Freq: Three times a day (TID) | ORAL | Status: AC
  Administered 2017-09-01 – 2017-09-03 (×7): 1 via ORAL
  Filled 2017-08-31: qty 1

## 2017-08-31 MED ORDER — BUPIVACAINE HCL 0.25 % IJ SOLN
INTRAMUSCULAR | Status: DC | PRN
  Administered 2017-08-31: 20 mL

## 2017-08-31 MED ORDER — ENOXAPARIN (LOVENOX) PATIENT EDUCATION KIT
PACK | Freq: Once | Status: DC
  Filled 2017-08-31: qty 1

## 2017-08-31 MED ORDER — SODIUM CHLORIDE 0.9 % IJ SOLN
INTRAMUSCULAR | Status: AC
  Filled 2017-08-31: qty 10

## 2017-08-31 MED ORDER — ENOXAPARIN SODIUM 40 MG/0.4ML ~~LOC~~ SOLN
40.0000 mg | SUBCUTANEOUS | Status: DC
  Administered 2017-09-01 – 2017-09-05 (×5): 40 mg via SUBCUTANEOUS
  Filled 2017-08-31 (×5): qty 0.4

## 2017-08-31 MED ORDER — HYDROCHLOROTHIAZIDE 12.5 MG PO CAPS
12.5000 mg | ORAL_CAPSULE | Freq: Every day | ORAL | Status: DC
  Administered 2017-09-01 – 2017-09-05 (×5): 12.5 mg via ORAL
  Filled 2017-08-31 (×5): qty 1

## 2017-08-31 MED ORDER — ONDANSETRON HCL 4 MG PO TABS
4.0000 mg | ORAL_TABLET | Freq: Four times a day (QID) | ORAL | Status: DC | PRN
  Administered 2017-09-04: 4 mg via ORAL
  Filled 2017-08-31: qty 1

## 2017-08-31 MED ORDER — HEMOSTATIC AGENTS (NO CHARGE) OPTIME
TOPICAL | Status: DC | PRN
  Administered 2017-08-31: 1 via TOPICAL

## 2017-08-31 MED ORDER — PROPOFOL 10 MG/ML IV BOLUS
INTRAVENOUS | Status: DC | PRN
  Administered 2017-08-31: 50 mg via INTRAVENOUS
  Administered 2017-08-31: 110 mg via INTRAVENOUS

## 2017-08-31 MED ORDER — BUPIVACAINE HCL (PF) 0.25 % IJ SOLN
INTRAMUSCULAR | Status: AC
  Filled 2017-08-31: qty 30

## 2017-08-31 MED ORDER — 0.9 % SODIUM CHLORIDE (POUR BTL) OPTIME
TOPICAL | Status: DC | PRN
  Administered 2017-08-31: 2000 mL

## 2017-08-31 MED ORDER — PROPOFOL 10 MG/ML IV BOLUS
INTRAVENOUS | Status: AC
  Filled 2017-08-31: qty 20

## 2017-08-31 MED ORDER — SENNOSIDES-DOCUSATE SODIUM 8.6-50 MG PO TABS
2.0000 | ORAL_TABLET | Freq: Every day | ORAL | Status: DC
  Administered 2017-08-31 – 2017-09-04 (×4): 2 via ORAL
  Filled 2017-08-31 (×4): qty 2

## 2017-08-31 MED ORDER — IRBESARTAN 300 MG PO TABS
300.0000 mg | ORAL_TABLET | Freq: Every day | ORAL | Status: DC
  Administered 2017-09-01 – 2017-09-05 (×5): 300 mg via ORAL
  Filled 2017-08-31 (×5): qty 1

## 2017-08-31 MED ORDER — LIDOCAINE 2% (20 MG/ML) 5 ML SYRINGE
INTRAMUSCULAR | Status: DC | PRN
  Administered 2017-08-31: 75 mg via INTRAVENOUS
  Administered 2017-08-31: 25 mg via INTRAVENOUS

## 2017-08-31 MED ORDER — SCOPOLAMINE 1 MG/3DAYS TD PT72
1.0000 | MEDICATED_PATCH | TRANSDERMAL | Status: DC
  Filled 2017-08-31: qty 1

## 2017-08-31 MED ORDER — ENOXAPARIN SODIUM 40 MG/0.4ML ~~LOC~~ SOLN
40.0000 mg | SUBCUTANEOUS | Status: AC
  Administered 2017-08-31: 40 mg via SUBCUTANEOUS
  Filled 2017-08-31: qty 0.4

## 2017-08-31 MED ORDER — OXYCODONE HCL 5 MG PO TABS
5.0000 mg | ORAL_TABLET | ORAL | Status: DC | PRN
  Administered 2017-08-31: 5 mg via ORAL
  Filled 2017-08-31: qty 1

## 2017-08-31 MED ORDER — NON FORMULARY: 1.0000 [IU] | Freq: Three times a day (TID) | Status: DC

## 2017-08-31 MED ORDER — ONDANSETRON HCL 4 MG/2ML IJ SOLN
INTRAMUSCULAR | Status: DC | PRN
  Administered 2017-08-31: 4 mg via INTRAVENOUS

## 2017-08-31 MED ORDER — SUGAMMADEX SODIUM 200 MG/2ML IV SOLN
INTRAVENOUS | Status: AC
  Filled 2017-08-31: qty 2

## 2017-08-31 MED ORDER — GLUCERNA SHAKE PO LIQD
237.0000 mL | Freq: Two times a day (BID) | ORAL | Status: DC
  Administered 2017-08-31 – 2017-09-05 (×10): 237 mL via ORAL
  Filled 2017-08-31 (×11): qty 237

## 2017-08-31 MED ORDER — ROCURONIUM BROMIDE 10 MG/ML (PF) SYRINGE
PREFILLED_SYRINGE | INTRAVENOUS | Status: DC | PRN
  Administered 2017-08-31: 10 mg via INTRAVENOUS
  Administered 2017-08-31: 60 mg via INTRAVENOUS

## 2017-08-31 MED ORDER — ACETAMINOPHEN 500 MG PO TABS
1000.0000 mg | ORAL_TABLET | Freq: Two times a day (BID) | ORAL | Status: DC
  Administered 2017-08-31 – 2017-09-05 (×7): 1000 mg via ORAL
  Filled 2017-08-31 (×10): qty 2

## 2017-08-31 MED ORDER — SODIUM CHLORIDE 0.9 % IJ SOLN
INTRAMUSCULAR | Status: DC | PRN
  Administered 2017-08-31: 20 mL

## 2017-08-31 MED ORDER — LABETALOL HCL 5 MG/ML IV SOLN
INTRAVENOUS | Status: AC
  Administered 2017-08-31: 5 mg
  Filled 2017-08-31: qty 4

## 2017-08-31 MED ORDER — SUFENTANIL CITRATE 50 MCG/ML IV SOLN
INTRAVENOUS | Status: AC
  Filled 2017-08-31: qty 1

## 2017-08-31 MED ORDER — FENTANYL CITRATE (PF) 100 MCG/2ML IJ SOLN: 25.0000 ug | INTRAMUSCULAR | Status: DC | PRN

## 2017-08-31 MED ORDER — OLMESARTAN-AMLODIPINE-HCTZ 40-5-12.5 MG PO TABS: 1.0000 | ORAL_TABLET | Freq: Every day | ORAL | Status: DC

## 2017-08-31 MED ORDER — HYDROMORPHONE HCL 2 MG/ML IJ SOLN
INTRAMUSCULAR | Status: AC
  Filled 2017-08-31: qty 1

## 2017-08-31 MED ORDER — INSULIN ASPART 100 UNIT/ML ~~LOC~~ SOLN
0.0000 [IU] | Freq: Three times a day (TID) | SUBCUTANEOUS | Status: DC
  Administered 2017-09-01: 2 [IU] via SUBCUTANEOUS

## 2017-08-31 MED ORDER — LABETALOL HCL 5 MG/ML IV SOLN: 5.0000 mg | INTRAVENOUS | Status: DC | PRN

## 2017-08-31 MED ORDER — BUPIVACAINE HCL (PF) 0.5 % IJ SOLN
INTRAMUSCULAR | Status: AC
  Filled 2017-08-31: qty 30

## 2017-08-31 MED ORDER — HYDROMORPHONE HCL 1 MG/ML IJ SOLN: 0.5000 mg | INTRAMUSCULAR | Status: DC | PRN

## 2017-08-31 SURGICAL SUPPLY — 68 items
ADH SKN CLS APL DERMABOND .7 (GAUZE/BANDAGES/DRESSINGS) ×1
AGENT HMST MTR 8 SURGIFLO (HEMOSTASIS)
ATTRACTOMAT 16X20 MAGNETIC DRP (DRAPES) ×1 IMPLANT
BLADE EXTENDED COATED 6.5IN (ELECTRODE) ×2 IMPLANT
CELLS DAT CNTRL 66122 CELL SVR (MISCELLANEOUS) IMPLANT
CHLORAPREP W/TINT 26ML (MISCELLANEOUS) ×2 IMPLANT
CLIP TI LARGE 6 (CLIP) ×3 IMPLANT
CLIP VESOCCLUDE LG 6/CT (CLIP) ×2 IMPLANT
CLIP VESOCCLUDE MED 6/CT (CLIP) ×2 IMPLANT
CLIP VESOCCLUDE MED LG 6/CT (CLIP) ×2 IMPLANT
CONT SPEC 4OZ CLIKSEAL STRL BL (MISCELLANEOUS) ×1 IMPLANT
DERMABOND ADVANCED (GAUZE/BANDAGES/DRESSINGS) ×1
DERMABOND ADVANCED .7 DNX12 (GAUZE/BANDAGES/DRESSINGS) IMPLANT
DRAPE INCISE 23X17 IOBAN STRL (DRAPES) ×1
DRAPE INCISE 23X17 STRL (DRAPES) IMPLANT
DRAPE INCISE IOBAN 23X17 STRL (DRAPES) ×1 IMPLANT
DRAPE INCISE IOBAN 66X45 STRL (DRAPES) ×1 IMPLANT
DRAPE WARM FLUID 44X44 (DRAPE) ×2 IMPLANT
DRSG OPSITE POSTOP 4X10 (GAUZE/BANDAGES/DRESSINGS) ×1 IMPLANT
DRSG OPSITE POSTOP 4X6 (GAUZE/BANDAGES/DRESSINGS) IMPLANT
DRSG OPSITE POSTOP 4X8 (GAUZE/BANDAGES/DRESSINGS) IMPLANT
ELECT REM PT RETURN 15FT ADLT (MISCELLANEOUS) ×2 IMPLANT
FLOSEAL 10ML (HEMOSTASIS) ×1 IMPLANT
GAUZE 4X4 16PLY RFD (DISPOSABLE) IMPLANT
GLOVE BIO SURGEON STRL SZ 6 (GLOVE) ×4 IMPLANT
GLOVE BIO SURGEON STRL SZ 6.5 (GLOVE) ×4 IMPLANT
GOWN STRL REUS W/ TWL LRG LVL3 (GOWN DISPOSABLE) ×2 IMPLANT
GOWN STRL REUS W/TWL LRG LVL3 (GOWN DISPOSABLE) ×4
HEMOSTAT ARISTA ABSORB 3G PWDR (MISCELLANEOUS) IMPLANT
KIT BASIN OR (CUSTOM PROCEDURE TRAY) ×2 IMPLANT
LIGASURE IMPACT 36 18CM CVD LR (INSTRUMENTS) ×2 IMPLANT
LOOP VESSEL MAXI BLUE (MISCELLANEOUS) ×1 IMPLANT
MARKER SKIN DUAL TIP RULER LAB (MISCELLANEOUS) ×1 IMPLANT
NEEDLE HYPO 22GX1.5 SAFETY (NEEDLE) ×4 IMPLANT
PACK GENERAL/GYN (CUSTOM PROCEDURE TRAY) ×2 IMPLANT
RELOAD PROXIMATE 75MM BLUE (ENDOMECHANICALS) IMPLANT
RELOAD PROXIMATE TA60MM BLUE (ENDOMECHANICALS) IMPLANT
RELOAD STAPLE 60 BLU REG PROX (ENDOMECHANICALS) IMPLANT
RELOAD STAPLE 75 3.8 BLU REG (ENDOMECHANICALS) IMPLANT
RETRACTOR WND ALEXIS 18 MED (MISCELLANEOUS) IMPLANT
RETRACTOR WND ALEXIS 25 LRG (MISCELLANEOUS) IMPLANT
RTRCTR WOUND ALEXIS 18CM MED (MISCELLANEOUS)
RTRCTR WOUND ALEXIS 25CM LRG (MISCELLANEOUS)
SHEET LAVH (DRAPES) ×2 IMPLANT
SPOGE SURGIFLO 8M (HEMOSTASIS)
SPONGE LAP 18X18 5 PK (GAUZE/BANDAGES/DRESSINGS) ×4 IMPLANT
SPONGE LAP 18X18 RF (DISPOSABLE) ×2 IMPLANT
SPONGE SURGIFLO 8M (HEMOSTASIS) IMPLANT
STAPLER GUN LINEAR PROX 60 (STAPLE) IMPLANT
STAPLER PROXIMATE 75MM BLUE (STAPLE) IMPLANT
STAPLER VISISTAT 35W (STAPLE) IMPLANT
SUT MNCRL AB 4-0 PS2 18 (SUTURE) ×4 IMPLANT
SUT PDS AB 1 TP1 96 (SUTURE) ×4 IMPLANT
SUT SILK 3 0 SH CR/8 (SUTURE) IMPLANT
SUT VIC AB 0 CT1 36 (SUTURE) ×8 IMPLANT
SUT VIC AB 2-0 CT1 36 (SUTURE) ×4 IMPLANT
SUT VIC AB 2-0 CT2 27 (SUTURE) ×12 IMPLANT
SUT VIC AB 2-0 SH 27 (SUTURE)
SUT VIC AB 2-0 SH 27X BRD (SUTURE) IMPLANT
SUT VIC AB 3-0 CTX 36 (SUTURE) IMPLANT
SUT VIC AB 3-0 SH 18 (SUTURE) IMPLANT
SUT VIC AB 3-0 SH 27 (SUTURE) ×10
SUT VIC AB 3-0 SH 27X BRD (SUTURE) ×1 IMPLANT
SYR 30ML LL (SYRINGE) ×4 IMPLANT
TOWEL OR 17X26 10 PK STRL BLUE (TOWEL DISPOSABLE) ×2 IMPLANT
TOWEL OR NON WOVEN STRL DISP B (DISPOSABLE) ×2 IMPLANT
TRAY FOLEY W/METER SILVER 16FR (SET/KITS/TRAYS/PACK) ×2 IMPLANT
UNDERPAD 30X30 (UNDERPADS AND DIAPERS) ×2 IMPLANT

## 2017-08-31 NOTE — Interval H&P Note (Signed)
History and Physical Interval Note:  08/31/2017 9:13 AM  Gloria Murphy  has presented today for surgery, with the diagnosis of PELVIC MASS  The various methods of treatment have been discussed with the patient and family. After consideration of risks, benefits and other options for treatment, the patient has consented to  Procedure(s): EXPLORATORY LAPAROTOMY (N/A) RESECTION OF PELVIC MASS (N/A) POSSIBLE STAGING, POSSIBLE DEBULKING (N/A) POSSIBLE SMALL BOWEL RESECTION (N/A) as a surgical intervention .  The patient's history has been reviewed, patient examined, no change in status, stable for surgery.  I have reviewed the patient's chart and labs.  CA 125 elevated to 76. Questions were answered to the patient's satisfaction.     Thereasa Solo

## 2017-08-31 NOTE — Transfer of Care (Signed)
Immediate Anesthesia Transfer of Care Note  Patient: Gloria Murphy  Procedure(s) Performed: EXPLORATORY LAPAROTOMY; BILATERAL SALPINGOOPHERECTOMY; OMENTECTOMY, TUMOR DEBULKING (N/A ) RESECTION OF PELVIC MASS (N/A )  Patient Location: PACU  Anesthesia Type:General  Level of Consciousness: awake, alert , oriented and patient cooperative  Airway & Oxygen Therapy: Patient Spontanous Breathing and Patient connected to face mask oxygen  Post-op Assessment: Report given to RN, Post -op Vital signs reviewed and stable and Patient moving all extremities X 4  Post vital signs: stable  Last Vitals:  Vitals Value Taken Time  BP 157/72 08/31/2017  2:53 PM  Temp    Pulse 70 08/31/2017  2:57 PM  Resp 15 08/31/2017  2:57 PM  SpO2 100 % 08/31/2017  2:57 PM  Vitals shown include unvalidated device data.  Last Pain:  Vitals:   08/31/17 1455  TempSrc:   PainSc: (P) Asleep      Patients Stated Pain Goal: 5 (67/61/95 0932)  Complications: No apparent anesthesia complications

## 2017-08-31 NOTE — Op Note (Signed)
OPERATIVE NOTE 08/31/17  Preoperative Diagnosis: left adnexal mass.  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   Procedure(s) Performed:  Exploratory laparotomy with bilateral salpingo-oophorectomy, omentectomy and radical debulking for ovarian cancer  Surgeon: Everitt Amber, M.D. Assistant : Lahoma Crocker, M.D. Assistant (an MD assistant was necessary for tissue manipulation, retraction and positioning due to the complexity of the case and hospital policies).  Specimens: Bilateral tubes / ovaries, bilateral pelvic and para-aortic lymph nodes, peritoneal biopsies, and omentum.  Estimated Blood Loss: 555mL. IV Fluids: 865mL.  Urine Output: 409BD  Complications: None.   Operative Findings:20 cm mass.   No intraperitoneal rupture occurred; densely adherent to sigmoid colon mesentery and residual uterine artery remnant. Surgically absent uterus. Normal appearing right ovary and tube. Smooth diaphragms, normal omentum, no palpably enlarged nodes. Normal small and large intestine. No gross extra-ovarian disease.  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  No gross visible disease remaining.    Procedure: After informed consent was confirmed, the patient was taken to the operating room where general anesthesia was obtained without difficulty. She was prepped and draped in the normal sterile fashion in the dorsal lithotomy position in padded Allen stirrups with good attention paid to support of the lower back and lower extremities. Position was adjusted for appropriate support. A Foley catheter was placed to gravity.   A paramedian vertical midline incision was made from the pubic symphysis to the umbilicus. The abdominal cavity was entered sharply and without incident. A Bookwalter retractor was then placed. A survey of the abdomen and pelvis revealed the above findings, which were significant for a large 20cm left-sided  mass.  The right tube and ovary were grossly normal in appearance. After packing the small bowel into the upper abdomen, we began the procedure by entering the left pelvic sidewall just posterior to the round ligament. The pararectal space was developed and the retroperitoneum developed up to the level of the common iliac artery. The course of the ureter was identified though this was challenging due to the adherence of the mass to the sigmoid colon mesentery and residual round ligament and uterine vessel. There was bleeding encountered from the left ovarian vessels during this time. The left IP was then skeletonized, clamped, cut and sealed with ligasure. Careful sharp dissection was employed to free the sigmoid colon from its attachments to the posterior surface of the ovarian . The remaining attachments to the vaginal cuff were then taken down sharply. There were dense adhesions with the left uterine artery residual remnant. It was clipped, and suture ligated. The left tube and ovary was sent for frozen section. It was apparent that the right tube and ovary were grossly normal in appearance and were sent for permanent pathology.   The left ureter was carefully skeletonized, marked with a vessel loop and retracted from the peritoneum. The ureter was untunneled away from the uterine artery remnant which allowed it to be kept free of the uterine artery remnant for suture ligation.   The right ovary and tube were then removed by opening up the retroperitoneum parallel to the IP ligament, identifying the ureter in the deep medial leaf and skeletonizing the IP ligament. This was sealed with ligasure and transected. The residual uterine artery remnant/vaginal attachments were sealed and transected on the right.    An infragastric omentectomy was then performed after dissecting the omentum off of the transverse colon. This was taken down with a combination of  Bovie cautery and the LigaSure. Palpation of the upper  abdomen, including the liver, spleen, stomach, and pancreas, was normal.  Given the cell type was   Granulosa cell, and there were no suspicious nodes with normal scan, given the patient's age, lymphadenectomy was not performed given the low yield for this procedure. The abdomen and pelvis were then copiously irrigated and all surgical sites found to be hemostatic. The fascia was reapproximated with 0 looped PDS using a total of three sutures. The subcutaneous layer was then irrigated with a liter of fluid and the deep layer closed with interrupted 2-0 Vicryl.  The subcutaneous layer was then reapproximated with a running 4-0 Monocryl. The patient tolerated the procedure well.   Sponge, lap and needle counts were correct x 3.    The patient had sequential compression devices for VTE prophylaxis and Lovenox preop and will receive Lovenox postoperatively.   The patient was extubated and taken to the recovery room in stable condition.   Thereasa Solo, MD

## 2017-08-31 NOTE — Anesthesia Procedure Notes (Signed)
Procedure Name: Intubation Date/Time: 08/31/2017 11:52 AM Performed by: Lissa Morales, CRNA Pre-anesthesia Checklist: Patient identified, Emergency Drugs available, Suction available and Patient being monitored Patient Re-evaluated:Patient Re-evaluated prior to induction Oxygen Delivery Method: Circle system utilized Preoxygenation: Pre-oxygenation with 100% oxygen Induction Type: IV induction Ventilation: Mask ventilation without difficulty Laryngoscope Size: Mac and 4 Grade View: Grade I Tube type: Oral Tube size: 7.0 mm Number of attempts: 1 Airway Equipment and Method: Stylet and Oral airway Placement Confirmation: ETT inserted through vocal cords under direct vision,  positive ETCO2 and breath sounds checked- equal and bilateral Secured at: 20 cm Tube secured with: Tape Dental Injury: Teeth and Oropharynx as per pre-operative assessment

## 2017-08-31 NOTE — Anesthesia Postprocedure Evaluation (Signed)
Anesthesia Post Note  Patient: Gloria Murphy  Procedure(s) Performed: EXPLORATORY LAPAROTOMY; BILATERAL SALPINGOOPHERECTOMY; OMENTECTOMY, TUMOR DEBULKING (N/A ) RESECTION OF PELVIC MASS (N/A )     Patient location during evaluation: PACU Anesthesia Type: General Level of consciousness: awake and alert Pain management: pain level controlled Vital Signs Assessment: post-procedure vital signs reviewed and stable Respiratory status: spontaneous breathing, nonlabored ventilation, respiratory function stable and patient connected to nasal cannula oxygen Cardiovascular status: blood pressure returned to baseline and stable Postop Assessment: no apparent nausea or vomiting Anesthetic complications: no    Last Vitals:  Vitals:   08/31/17 1615 08/31/17 1640  BP: (!) 153/66 (!) 168/75  Pulse: 79 80  Resp: 20 18  Temp:  36.5 C  SpO2: 100% 100%    Last Pain:  Vitals:   08/31/17 1700  TempSrc:   PainSc: Asleep                 Montez Hageman

## 2017-08-31 NOTE — Interval H&P Note (Signed)
History and Physical Interval Note:  08/31/2017 9:12 AM  Gloria Murphy  has presented today for surgery, with the diagnosis of PELVIC MASS  The various methods of treatment have been discussed with the patient and family. After consideration of risks, benefits and other options for treatment, the patient has consented to  Procedure(s): EXPLORATORY LAPAROTOMY (N/A) RESECTION OF PELVIC MASS (N/A) POSSIBLE STAGING, POSSIBLE DEBULKING (N/A) POSSIBLE SMALL BOWEL RESECTION (N/A) as a surgical intervention .  The patient's history has been reviewed, patient examined, no change in status, stable for surgery.  I have reviewed the patient's chart and labs. Elevated CA 125 to 76.  Questions were answered to the patient's satisfaction.     Thereasa Solo

## 2017-08-31 NOTE — Addendum Note (Signed)
Addendum  created 08/31/17 1749 by Lissa Morales, CRNA   Intraprocedure Meds edited

## 2017-08-31 NOTE — Anesthesia Preprocedure Evaluation (Signed)
Anesthesia Evaluation  Patient identified by MRN, date of birth, ID band Patient awake    Reviewed: Allergy & Precautions, NPO status , Patient's Chart, lab work & pertinent test results  Airway Mallampati: II  TM Distance: >3 FB Neck ROM: Full    Dental no notable dental hx. (+) Upper Dentures, Lower Dentures   Pulmonary neg pulmonary ROS,    Pulmonary exam normal breath sounds clear to auscultation       Cardiovascular hypertension, Pt. on medications Normal cardiovascular exam Rhythm:Regular Rate:Normal     Neuro/Psych negative neurological ROS  negative psych ROS   GI/Hepatic negative GI ROS, Neg liver ROS,   Endo/Other  diabetes, Type 2  Renal/GU negative Renal ROS  negative genitourinary   Musculoskeletal negative musculoskeletal ROS (+)   Abdominal   Peds negative pediatric ROS (+)  Hematology negative hematology ROS (+)   Anesthesia Other Findings   Reproductive/Obstetrics negative OB ROS                            Anesthesia Physical Anesthesia Plan  ASA: II  Anesthesia Plan: General   Post-op Pain Management:    Induction: Intravenous  PONV Risk Score and Plan: 4 or greater and Ondansetron and Treatment may vary due to age or medical condition  Airway Management Planned: Oral ETT  Additional Equipment:   Intra-op Plan:   Post-operative Plan: Extubation in OR  Informed Consent: I have reviewed the patients History and Physical, chart, labs and discussed the procedure including the risks, benefits and alternatives for the proposed anesthesia with the patient or authorized representative who has indicated his/her understanding and acceptance.   Dental advisory given  Plan Discussed with: CRNA  Anesthesia Plan Comments:         Anesthesia Quick Evaluation

## 2017-09-01 LAB — GLUCOSE, CAPILLARY
GLUCOSE-CAPILLARY: 124 mg/dL — AB (ref 65–99)
GLUCOSE-CAPILLARY: 145 mg/dL — AB (ref 65–99)
Glucose-Capillary: 120 mg/dL — ABNORMAL HIGH (ref 65–99)
Glucose-Capillary: 135 mg/dL — ABNORMAL HIGH (ref 65–99)

## 2017-09-01 LAB — BASIC METABOLIC PANEL
ANION GAP: 8 (ref 5–15)
BUN: 9 mg/dL (ref 6–20)
CO2: 26 mmol/L (ref 22–32)
Calcium: 8.6 mg/dL — ABNORMAL LOW (ref 8.9–10.3)
Chloride: 104 mmol/L (ref 101–111)
Creatinine, Ser: 0.8 mg/dL (ref 0.44–1.00)
GFR calc Af Amer: >60 mL/min (ref >60)
GLUCOSE: 129 mg/dL — AB (ref 65–99)
POTASSIUM: 3.8 mmol/L (ref 3.5–5.1)
Sodium: 138 mmol/L (ref 135–145)

## 2017-09-01 LAB — CBC
HEMATOCRIT: 31.7 % — AB (ref 36.0–46.0)
Hemoglobin: 10.4 g/dL — ABNORMAL LOW (ref 12.0–15.0)
MCH: 27.7 pg (ref 26.0–34.0)
MCHC: 32.8 g/dL (ref 30.0–36.0)
MCV: 84.3 fL (ref 78.0–100.0)
PLATELETS: 146 10*3/uL — AB (ref 150–400)
RBC: 3.76 MIL/uL — AB (ref 3.87–5.11)
RDW: 13.6 % (ref 11.5–15.5)
WBC: 5.4 10*3/uL (ref 4.0–10.5)

## 2017-09-01 MED ORDER — SIMETHICONE 80 MG PO CHEW
80.0000 mg | CHEWABLE_TABLET | Freq: Four times a day (QID) | ORAL | Status: DC | PRN
Start: 2017-09-01 — End: 2017-09-05
  Administered 2017-09-02 – 2017-09-05 (×3): 80 mg via ORAL
  Filled 2017-09-01 (×3): qty 1

## 2017-09-01 NOTE — Progress Notes (Signed)
1 Day Post-Op Procedure(s) (LRB): EXPLORATORY LAPAROTOMY; BILATERAL SALPINGOOPHERECTOMY; OMENTECTOMY, TUMOR DEBULKING (N/A) RESECTION OF PELVIC MASS (N/A)  Subjective: Patient reports no abdominal pain at this time.  Ambulating without difficulty.  Has not had breakfast this am.  No nausea or emesis reported.  Passing flatus.  No chest pain or dyspnea reported.  No concerns voiced.  Husband at the bedside.    Objective: Vital signs in last 24 hours: Temp:  [97.6 F (36.4 C)-98.8 F (37.1 C)] 98.8 F (37.1 C) (04/26 0604) Pulse Rate:  [67-84] 74 (04/26 0604) Resp:  [14-23] 18 (04/26 0604) BP: (131-172)/(65-80) 142/66 (04/26 0604) SpO2:  [100 %] 100 % (04/26 0604) Last BM Date: 08/31/17(before surgery)  Intake/Output from previous day: 04/25 0701 - 04/26 0700 In: 4451.7 [P.O.:350; I.V.:3601.7; IV Piggyback:500] Out: 1700 [Urine:1000; Blood:500]  Physical Examination: General: alert, cooperative and no distress Resp: clear to auscultation bilaterally Cardio: regular rate and rhythm, S1, S2 normal, no murmur, click, rub or gallop GI: incision: midline incision with op site dressing in place. Stainmarked but no active drainage noted and abdomen soft, active bowel sounds, slightly tympanic Extremities: extremities normal, atraumatic, no cyanosis or edema  Labs: WBC/Hgb/Hct/Plts:  5.4/10.4/31.7/146 (04/26 0445) BUN/Cr/glu/ALT/AST/amyl/lip:  9/0.80/--/--/--/--/-- (04/26 0445)  Assessment: 82 y.o. s/p Procedure(s): EXPLORATORY LAPAROTOMY; BILATERAL SALPINGOOPHERECTOMY; OMENTECTOMY, TUMOR DEBULKING RESECTION OF PELVIC MASS: stable Pain:  Pain is well-controlled on PRN medications.  Heme: Hgb 10.4 and Hct 31.7 this am.  Stable post-op  CV: BP and HR stable.  Continue to monitor with ordered vital signs.  GI:  Tolerating po: Yes.  Antiemetics ordered PRN.  GU: Due to void since foley removal.    FEN: Stable this am.  Endo: Diabetes mellitus Type II, under good control..  CBG:   CBG (last 3)  Recent Labs    08/31/17 0917 08/31/17 2149 09/01/17 0742  GLUCAP 165* 102* 120*   Prophylaxis: pharmacologic prophylaxis (with any of the following: enoxaparin (Lovenox) 40mg  SQ 2 hours prior to surgery then every day) and intermittent pneumatic compression boots.  Plan: Diet as tolerated Saline lock if adequate PO intake Encourage ambulation, IS use, deep breathing, and coughing Continue plan of care per Dr. Delsa Sale    LOS: 1 day    Passion Lavin D Ralyn Stlaurent 09/01/2017, 10:22 AM

## 2017-09-02 LAB — CBC WITH DIFFERENTIAL/PLATELET
Basophils Absolute: 0 10*3/uL (ref 0.0–0.1)
Basophils Relative: 0 %
EOS ABS: 0.1 10*3/uL (ref 0.0–0.7)
Eosinophils Relative: 2 %
HEMATOCRIT: 32.3 % — AB (ref 36.0–46.0)
Hemoglobin: 10.6 g/dL — ABNORMAL LOW (ref 12.0–15.0)
LYMPHS ABS: 0.8 10*3/uL (ref 0.7–4.0)
Lymphocytes Relative: 12 %
MCH: 27.7 pg (ref 26.0–34.0)
MCHC: 32.8 g/dL (ref 30.0–36.0)
MCV: 84.6 fL (ref 78.0–100.0)
MONOS PCT: 9 %
Monocytes Absolute: 0.6 10*3/uL (ref 0.1–1.0)
NEUTROS ABS: 5.1 10*3/uL (ref 1.7–7.7)
Neutrophils Relative %: 77 %
Platelets: 161 10*3/uL (ref 150–400)
RBC: 3.82 MIL/uL — ABNORMAL LOW (ref 3.87–5.11)
RDW: 13.8 % (ref 11.5–15.5)
WBC: 6.7 10*3/uL (ref 4.0–10.5)

## 2017-09-02 LAB — BASIC METABOLIC PANEL
ANION GAP: 9 (ref 5–15)
BUN: 8 mg/dL (ref 6–20)
CALCIUM: 9.6 mg/dL (ref 8.9–10.3)
CO2: 26 mmol/L (ref 22–32)
Chloride: 104 mmol/L (ref 101–111)
Creatinine, Ser: 1.09 mg/dL — ABNORMAL HIGH (ref 0.44–1.00)
GFR calc Af Amer: 54 mL/min — ABNORMAL LOW (ref >60)
GFR calc non Af Amer: 47 mL/min — ABNORMAL LOW (ref >60)
GLUCOSE: 127 mg/dL — AB (ref 65–99)
Potassium: 4 mmol/L (ref 3.5–5.1)
Sodium: 139 mmol/L (ref 135–145)

## 2017-09-02 LAB — GLUCOSE, CAPILLARY
GLUCOSE-CAPILLARY: 126 mg/dL — AB (ref 65–99)
Glucose-Capillary: 72 mg/dL (ref 65–99)
Glucose-Capillary: 99 mg/dL (ref 65–99)
Glucose-Capillary: 114 mg/dL — ABNORMAL HIGH (ref 65–99)

## 2017-09-02 NOTE — Progress Notes (Signed)
Pt c/o feeling "nauseated and gassy" Encouraged pt to get OOB and ambulate in the hallways to see if that would help her expel gas. Pt response "I will try too." Several family members at bedside encouraging pt to ambulate as well.

## 2017-09-02 NOTE — Progress Notes (Signed)
Pt continues to c/o of nausea, medicated with simethicone and given diet gingerale at this time. Pt informed that it was too soon to receive Zofran. Pt is once again encouraged to ambulate in hall.

## 2017-09-02 NOTE — Progress Notes (Signed)
2 Days Post-Op Procedure(s) (LRB): EXPLORATORY LAPAROTOMY; BILATERAL SALPINGOOPHERECTOMY; OMENTECTOMY, TUMOR DEBULKING (N/A) RESECTION OF PELVIC MASS (N/A)  Subjective: Patient reports no abdominal pain at this time.  Ambulating without difficulty (though not since yesterday morning). Has nausea with eating and is distended. Has passed a little flatus but no BM.  Objective: Vital signs in last 24 hours: Temp:  [98.7 F (37.1 C)-99.1 F (37.3 C)] 99.1 F (37.3 C) (04/27 0528) Pulse Rate:  [70-75] 70 (04/27 0528) Resp:  [14-16] 14 (04/27 0528) BP: (139-152)/(68-76) 139/69 (04/27 0528) SpO2:  [99 %-100 %] 100 % (04/27 0528) Last BM Date: 08/31/17(before surgery)  Intake/Output from previous day: 04/26 0701 - 04/27 0700 In: 880 [P.O.:480; I.V.:400] Out: 1675 [Urine:1675]  Physical Examination: General: alert, cooperative and no distress Resp: clear to auscultation bilaterally Cardio: regular rate and rhythm, S1, S2 normal, no murmur, click, rub or gallop GI: incision: midline incision with op site dressing in place. Stainmarked but no active drainage noted and abdomen soft, active bowel sounds, very tympanic, distended.  Extremities: extremities normal, atraumatic, no cyanosis or edema  Labs:      Assessment: 82 y.o. s/p Procedure(s): EXPLORATORY LAPAROTOMY; BILATERAL SALPINGOOPHERECTOMY; OMENTECTOMY, TUMOR DEBULKING RESECTION OF PELVIC MASS: stable Pain:  Pain is well-controlled on PRN medications.  Heme: Hgb 10.4 and Hct 31.7 POD 1 (recheck today)  CV: BP and HR stable.  Continue to monitor with ordered vital signs.  GI:  Tolerating po: Yes.  Antiemetics ordered PRN. Has delayed return of bowel function. Will watch overnignt and if improved passage of gas (vs development of ileus) will d.c. To home.   GU: voiding  FEN: Recheck lytes  Endo: Diabetes mellitus Type II, under good control..  CBG:  CBG (last 3)  Recent Labs    09/01/17 1747 09/01/17 2131 09/02/17 0759   GLUCAP 124* 145* 72   Prophylaxis: pharmacologic prophylaxis (with any of the following: enoxaparin (Lovenox) 40mg  SQ 2 hours prior to surgery then every day) and intermittent pneumatic compression boots.  Plan: Diet as tolerated Saline lock. Encourage ambulation - discussed 5 times per day, continue chewing gum, IS use, deep breathing, and coughing Will discharge tomorrow if tolerating better PO and less distended.     LOS: 2 days    Thereasa Solo 09/02/2017, 11:09 AM

## 2017-09-03 LAB — GLUCOSE, CAPILLARY
GLUCOSE-CAPILLARY: 109 mg/dL — AB (ref 65–99)
Glucose-Capillary: 109 mg/dL — ABNORMAL HIGH (ref 65–99)
Glucose-Capillary: 101 mg/dL — ABNORMAL HIGH (ref 65–99)
Glucose-Capillary: 106 mg/dL — ABNORMAL HIGH (ref 65–99)

## 2017-09-03 MED ORDER — ENOXAPARIN (LOVENOX) PATIENT EDUCATION KIT
PACK | Freq: Once | Status: AC
  Administered 2017-09-03: 14:00:00
  Filled 2017-09-03: qty 1

## 2017-09-03 NOTE — Progress Notes (Signed)
3 Days Post-Op Procedure(s) (LRB): EXPLORATORY LAPAROTOMY; BILATERAL SALPINGOOPHERECTOMY; OMENTECTOMY, TUMOR DEBULKING (N/A) RESECTION OF PELVIC MASS (N/A)  Subjective: Patient reports no abdominal pain at this time.  Ambulating without difficulty some more yesterday.  She had emesis x 3 last night. Then passed flatus this morning, but still having nausea and not feeling very hungry. Would prefer to stay another day.   Objective: Vital signs in last 24 hours: Temp:  [98.5 F (36.9 C)-98.9 F (37.2 C)] 98.9 F (37.2 C) (04/28 0539) Pulse Rate:  [73-95] 79 (04/28 0539) Resp:  [16-18] 17 (04/28 0539) BP: (146-152)/(69-83) 146/69 (04/28 0539) SpO2:  [99 %] 99 % (04/28 0539) Last BM Date: 08/31/17  Intake/Output from previous day: 04/27 0701 - 04/28 0700 In: 240 [P.O.:240] Out: 351 [Urine:350; Emesis/NG output:1]  Physical Examination: General: alert, cooperative and no distress Resp: clear to auscultation bilaterally Cardio: regular rate and rhythm, S1, S2 normal, no murmur, click, rub or gallop GI: incision: midline incision with op site dressing in place. Stainmarked but no active drainage noted and abdomen soft, active bowel sounds, very tympanic, slightly less distended.  Extremities: extremities normal, atraumatic, no cyanosis or edema  Labs:      CBC    Component Value Date/Time   WBC 6.7 09/02/2017 1106   RBC 3.82 (L) 09/02/2017 1106   HGB 10.6 (L) 09/02/2017 1106   HCT 32.3 (L) 09/02/2017 1106   PLT 161 09/02/2017 1106   MCV 84.6 09/02/2017 1106   MCV 89.5 07/03/2012 1243   MCH 27.7 09/02/2017 1106   MCHC 32.8 09/02/2017 1106   RDW 13.8 09/02/2017 1106   LYMPHSABS 0.8 09/02/2017 1106   MONOABS 0.6 09/02/2017 1106   EOSABS 0.1 09/02/2017 1106   BASOSABS 0.0 09/02/2017 1106   BMET    Component Value Date/Time   NA 139 09/02/2017 1106   K 4.0 09/02/2017 1106   CL 104 09/02/2017 1106   CO2 26 09/02/2017 1106   GLUCOSE 127 (H) 09/02/2017 1106   BUN 8  09/02/2017 1106   CREATININE 1.09 (H) 09/02/2017 1106   CALCIUM 9.6 09/02/2017 1106   GFRNONAA 47 (L) 09/02/2017 1106   GFRAA 54 (L) 09/02/2017 1106     Assessment: 82 y.o. s/p Procedure(s): EXPLORATORY LAPAROTOMY; BILATERAL SALPINGOOPHERECTOMY; OMENTECTOMY, TUMOR DEBULKING RESECTION OF PELVIC MASS: stable Pain:  Pain is well-controlled on PRN medications.  Heme: Hgb 10.4 and Hct 31.7 POD 1 (recheck today)  CV: BP and HR stable.  Continue to monitor with ordered vital signs.  GI:  Tolerating po: No  Antiemetics ordered PRN. Has delayed return of bowel function. Will watch overnignt and if improved passage of gas (vs development of ileus) will d.c. To home.   GU: voiding  FEN: lytes stable and appropriate  Endo: Diabetes mellitus Type II, under good control..  CBG:  CBG (last 3)  Recent Labs    09/02/17 1655 09/02/17 2058 09/03/17 0743  GLUCAP 114* 126* 109*   Prophylaxis: pharmacologic prophylaxis (with any of the following: enoxaparin (Lovenox) 40mg  SQ 2 hours prior to surgery then every day) and intermittent pneumatic compression boots.  Plan: Diet as tolerated Saline lock. Encourage ambulation - discussed 5 times per day, continue chewing gum, IS use, deep breathing, and coughing Will discharge tomorrow if tolerating better PO and less distended and no emesis    LOS: 3 days    Gloria Murphy 09/03/2017, 11:14 AM

## 2017-09-04 LAB — GLUCOSE, CAPILLARY
GLUCOSE-CAPILLARY: 82 mg/dL (ref 65–99)
GLUCOSE-CAPILLARY: 82 mg/dL (ref 65–99)
Glucose-Capillary: 96 mg/dL (ref 65–99)
Glucose-Capillary: 111 mg/dL — ABNORMAL HIGH (ref 65–99)

## 2017-09-04 MED ORDER — CALCIUM CARBONATE ANTACID 500 MG PO CHEW
1.0000 | CHEWABLE_TABLET | Freq: Three times a day (TID) | ORAL | Status: DC | PRN
  Administered 2017-09-04: 200 mg via ORAL
  Filled 2017-09-04: qty 1

## 2017-09-04 NOTE — Care Management Important Message (Signed)
Important Message  Patient Details  Name: SYONA WROBLEWSKI MRN: [id number] Date of Birth: Jun 13, 1935   Medicare Important Message Given:  Yes    Kerin Salen 09/04/2017, 11:42 AMImportant Message  Patient Details  Name: VERNECIA UMBLE MRN: [id number] Date of Birth: Jun 24, 1935   Medicare Important Message Given:  Yes    Kerin Salen 09/04/2017, 11:42 AM

## 2017-09-04 NOTE — Progress Notes (Addendum)
4 Days Post-Op Procedure(s) (LRB): EXPLORATORY LAPAROTOMY; BILATERAL SALPINGOOPHERECTOMY; OMENTECTOMY, TUMOR DEBULKING (N/A) RESECTION OF PELVIC MASS (N/A)  Subjective: Patient reports no abdominal pain at this time. Reporting continued nausea with no emesis.  States she does not feel like eating but she has been taking in small amounts.  She had a few bites of grits this am.  Had a small BM this am and passing some flatus.  Denies chest pain, dyspnea.  Husband and daughter at the bedside. No concerns voiced. Pt stating she would like to try tums to see if her symptoms may be related to reflux.   Objective: Vital signs in last 24 hours: Temp:  [98.5 F (36.9 C)-98.7 F (37.1 C)] 98.5 F (36.9 C) (04/29 0524) Pulse Rate:  [75-83] 75 (04/29 0524) Resp:  [15-17] 15 (04/29 0524) BP: (123-144)/(58-81) 123/58 (04/29 0524) SpO2:  [99 %-100 %] 100 % (04/29 0524) Last BM Date: 08/31/17  Intake/Output from previous day: 04/28 0701 - 04/29 0700 In: 180 [P.O.:180] Out: 850 [Urine:850]  Physical Examination: General: alert, cooperative and no distress Resp: clear to auscultation bilaterally Cardio: regular rate and rhythm, S1, S2 normal, no murmur, click, rub or gallop GI: incision: Abdomen soft, active bowel sounds, tympanic, slightly distended (pt states that is her norm). Op site dressing removed from the incision.  Dermabond intact.  No active drainage noted. Extremities: extremities normal, atraumatic, no cyanosis or edema  Labs:      CBC    Component Value Date/Time   WBC 6.7 09/02/2017 1106   RBC 3.82 (L) 09/02/2017 1106   HGB 10.6 (L) 09/02/2017 1106   HCT 32.3 (L) 09/02/2017 1106   PLT 161 09/02/2017 1106   MCV 84.6 09/02/2017 1106   MCV 89.5 07/03/2012 1243   MCH 27.7 09/02/2017 1106   MCHC 32.8 09/02/2017 1106   RDW 13.8 09/02/2017 1106   LYMPHSABS 0.8 09/02/2017 1106   MONOABS 0.6 09/02/2017 1106   EOSABS 0.1 09/02/2017 1106   BASOSABS 0.0 09/02/2017 1106   BMET     Component Value Date/Time   NA 139 09/02/2017 1106   K 4.0 09/02/2017 1106   CL 104 09/02/2017 1106   CO2 26 09/02/2017 1106   GLUCOSE 127 (H) 09/02/2017 1106   BUN 8 09/02/2017 1106   CREATININE 1.09 (H) 09/02/2017 1106   CALCIUM 9.6 09/02/2017 1106   GFRNONAA 47 (L) 09/02/2017 1106   GFRAA 54 (L) 09/02/2017 1106    Assessment: 82 y.o. s/p Procedure(s): EXPLORATORY LAPAROTOMY; BILATERAL SALPINGOOPHERECTOMY; OMENTECTOMY, TUMOR DEBULKING RESECTION OF PELVIC MASS: stable Pain:  Pain is well-controlled on PRN medications.  Heme: Hgb 10.6 and Hct 32.3 09/02/17. Stable  CV: BP and HR stable.  Continue to monitor with ordered vital signs.  GI:  Tolerating po: small amount. Antiemetics ordered PRN.   GU: Voiding without difficulty.  FEN: Lytes stable and appropriate from 09/02/17  Endo: Diabetes mellitus Type II, under good control..  CBG:  CBG (last 3)  Recent Labs    09/03/17 2150 09/04/17 0739 09/04/17 1137  GLUCAP 106* 82 82   Prophylaxis: pharmacologic prophylaxis (with any of the following: enoxaparin (Lovenox) 40mg  SQ 2 hours prior to surgery then every day) and intermittent pneumatic compression boots.  Plan: Diet as tolerated Encourage ambulation - continue chewing gum, IS use, deep breathing, and coughing Continue plan of care per Dr. Gerarda Fraction Hold on discharge at this time until improvement in nausea   LOS: 4 days    Melissa D Cross 09/04/2017, 1:12 PM  Flatus and ?small BM however feeling nauseated after small bites. Smell of food making her nauseated. Walked one time so far today. On exam bowel sounds ocassional high pitched and tympanic and abdomen is protuberant versus distended. Plan to monitor today Agree with above note by M.Elinor Parkinson, NP

## 2017-09-05 LAB — BASIC METABOLIC PANEL
Anion gap: 12 (ref 5–15)
BUN: 9 mg/dL (ref 6–20)
CO2: 27 mmol/L (ref 22–32)
Calcium: 9.1 mg/dL (ref 8.9–10.3)
Chloride: 98 mmol/L — ABNORMAL LOW (ref 101–111)
Creatinine, Ser: 0.83 mg/dL (ref 0.44–1.00)
GFR calc Af Amer: >60 mL/min (ref >60)
GFR calc non Af Amer: >60 mL/min (ref >60)
Glucose, Bld: 86 mg/dL (ref 65–99)
Potassium: 2.9 mmol/L — ABNORMAL LOW (ref 3.5–5.1)
Sodium: 137 mmol/L (ref 135–145)

## 2017-09-05 LAB — GLUCOSE, CAPILLARY
GLUCOSE-CAPILLARY: 78 mg/dL (ref 65–99)
Glucose-Capillary: 84 mg/dL (ref 65–99)

## 2017-09-05 LAB — MAGNESIUM: Magnesium: 1.7 mg/dL (ref 1.7–2.4)

## 2017-09-05 MED ORDER — POTASSIUM CHLORIDE CRYS ER 20 MEQ PO TBCR
40.0000 meq | EXTENDED_RELEASE_TABLET | Freq: Two times a day (BID) | ORAL | Status: DC
  Administered 2017-09-05: 40 meq via ORAL
  Filled 2017-09-05: qty 2

## 2017-09-05 MED ORDER — ENOXAPARIN SODIUM 40 MG/0.4ML ~~LOC~~ SOLN: 40.0000 mg | SUBCUTANEOUS | 0 refills | Status: DC

## 2017-09-05 NOTE — Progress Notes (Signed)
5 Days Post-Op Procedure(s) (LRB): EXPLORATORY LAPAROTOMY; BILATERAL SALPINGOOPHERECTOMY; OMENTECTOMY, TUMOR DEBULKING (N/A) RESECTION OF PELVIC MASS (N/A)  Subjective: Patient reports gas pains this am with belching.  Small amount of flatus reported.  BM was 2 days ago.  Decreased appetite this am and states she is trying to force some food but feels it may come up at times.  Minimal abdominal pain reported.  States she feels her abdomen is slightly more distended today compared to yesterday.  Ambulating without difficulty.  Denies chest pain or dyspnea.  Husband at the bedside. No concerns voiced.  Objective: Vital signs in last 24 hours: Temp:  [97.5 F (36.4 C)-98.5 F (36.9 C)] 98.5 F (36.9 C) (04/30 0550) Pulse Rate:  [72-73] 72 (04/30 0550) Resp:  [16-18] 16 (04/30 0550) BP: (129-149)/(65-81) 149/73 (04/30 0550) SpO2:  [100 %] 100 % (04/30 0550) Last BM Date: 09/04/17  Intake/Output from previous day: 04/29 0701 - 04/30 0700 In: 660 [P.O.:660] Out: 1650 [Urine:1650]  Physical Examination: General: alert, cooperative and no distress Resp: clear to auscultation bilaterally Cardio: regular rate and rhythm, S1, S2 normal, no murmur, click, rub or gallop GI: incision: Abdomen soft, active bowel sounds, tympanic, slightly more distended compared to yesterday's examination.  Dermabond on midline incision intact.  No active drainage noted. Extremities: extremities normal, atraumatic, no cyanosis or edema  Labs:   BUN/Cr/glu/ALT/AST/amyl/lip:  9/0.83/--/--/--/--/-- (04/30 0543)  CBC    Component Value Date/Time   WBC 6.7 09/02/2017 1106   RBC 3.82 (L) 09/02/2017 1106   HGB 10.6 (L) 09/02/2017 1106   HCT 32.3 (L) 09/02/2017 1106   PLT 161 09/02/2017 1106   MCV 84.6 09/02/2017 1106   MCV 89.5 07/03/2012 1243   MCH 27.7 09/02/2017 1106   MCHC 32.8 09/02/2017 1106   RDW 13.8 09/02/2017 1106   LYMPHSABS 0.8 09/02/2017 1106   MONOABS 0.6 09/02/2017 1106   EOSABS 0.1  09/02/2017 1106   BASOSABS 0.0 09/02/2017 1106   BMET    Component Value Date/Time   NA 137 09/05/2017 0543   K 2.9 (L) 09/05/2017 0543   CL 98 (L) 09/05/2017 0543   CO2 27 09/05/2017 0543   GLUCOSE 86 09/05/2017 0543   BUN 9 09/05/2017 0543   CREATININE 0.83 09/05/2017 0543   CALCIUM 9.1 09/05/2017 0543   GFRNONAA >60 09/05/2017 0543   GFRAA >60 09/05/2017 0543    Assessment: 82 y.o. s/p Procedure(s): EXPLORATORY LAPAROTOMY; BILATERAL SALPINGOOPHERECTOMY; OMENTECTOMY, TUMOR DEBULKING RESECTION OF PELVIC MASS: stable Pain:  Pain is well-controlled on PRN medications.  Heme: Hgb 10.6 and Hct 32.3 09/02/17. Stable  CV: BP and HR stable.  Continue to monitor with ordered vital signs.  GI:  Tolerating po: small amount. Antiemetics ordered PRN.   GU: Voiding without difficulty.  FEN: K+ 2.9 this am and Mag 1.7.   Endo: Diabetes mellitus Type II, under good control..  CBG:  CBG (last 3)  Recent Labs    09/04/17 2129 09/05/17 0751 09/05/17 1152  GLUCAP 111* 78 84   Prophylaxis: pharmacologic prophylaxis (with any of the following: enoxaparin (Lovenox) 40mg  SQ 2 hours prior to surgery then every day) and intermittent pneumatic compression boots.  Plan: Kdur ordered Simethicone PRN per pt request Diet as tolerated Encourage ambulation - continue chewing gum, IS use, deep breathing, and coughing Continue plan of care per Dr. Denman George    LOS: 5 days    Mearle Drew D Bellany Elbaum 09/05/2017, 1:12 PM

## 2017-09-05 NOTE — Progress Notes (Signed)
Discharge and medication instructions, specifically Lovenox injections, reviewed with patient and spouse. Questions answered and both deny further questions. No prescriptions given to patient. Spouse is driving her home.  Donne Hazel, RN

## 2017-09-05 NOTE — Discharge Summary (Addendum)
Physician Discharge Summary  Patient ID: Gloria Murphy MRN: [id number] DOB/AGE: [birth year] 82 y.o.  Admit date: 08/31/2017 Discharge date: 09/05/2017  Admission Diagnoses: Pelvic mass  Discharge Diagnoses:  Principal Problem:   Pelvic mass Active Problems:   Elevated CA-125   Pelvic mass in female   Discharged Condition:  The patient is in good condition and stable for discharge.    Hospital Course: On 08/31/2017, the patient underwent the following: Procedure(s): EXPLORATORY LAPAROTOMY; BILATERAL SALPINGOOPHERECTOMY; OMENTECTOMY, TUMOR DEBULKING RESECTION OF PELVIC MASS.  The postoperative course was uneventful except for delayed return of bowel function.  She was discharged to home on postoperative day 5 tolerating a regular diet, ambulating, voiding, passing flatus. She was discharged home with lovenox for a total of 28 days post-op (5 given inpt). She declined pain medication prescription at discharge.  Consults: None  Significant Diagnostic Studies: None  Treatments: surgery: see above  Discharge Exam (am assessment). Pt also examined by Dr. Denman George at later time: Blood pressure (!) 149/73, pulse 72, temperature 98.5 F (36.9 C), temperature source Oral, resp. rate 16, height 5\' 9"  (1.753 m), weight 202 lb (91.6 kg), SpO2 100 %. General appearance: alert, cooperative and no distress Resp: clear to auscultation bilaterally Cardio: regular rate and rhythm, S1, S2 normal, no murmur, click, rub or gallop GI: active bowel sounds, abdomen slightly distended and tympanic on percussion, soft Extremities: extremities normal, atraumatic, no cyanosis or edema Incision/Wound: midline incision with dermabond without erythema or drainage  Disposition: Discharge disposition: 01-Home or Self Care       Discharge Instructions    Call MD for:  difficulty breathing, headache or visual disturbances   Complete by:  As directed    Call MD for:  extreme fatigue   Complete by:  As  directed    Call MD for:  hives   Complete by:  As directed    Call MD for:  persistant dizziness or light-headedness   Complete by:  As directed    Call MD for:  persistant nausea and vomiting   Complete by:  As directed    Call MD for:  redness, tenderness, or signs of infection (pain, swelling, redness, odor or green/yellow discharge around incision site)   Complete by:  As directed    Call MD for:  severe uncontrolled pain   Complete by:  As directed    Call MD for:  temperature >100.4   Complete by:  As directed    Diet - low sodium heart healthy   Complete by:  As directed    Driving Restrictions   Complete by:  As directed    No driving for 2 weeks from surgery.  Do not take narcotics and drive.   Increase activity slowly   Complete by:  As directed    Lifting restrictions   Complete by:  As directed    No lifting greater than 10 lbs.   Sexual Activity Restrictions   Complete by:  As directed    No sexual activity, nothing in the vagina, for 2 weeks.     Allergies as of 09/05/2017   No Known Allergies     Medication List    TAKE these medications   CALTRATE 600 PO Take 1 tablet by mouth daily.   cycloSPORINE 0.05 % ophthalmic emulsion Commonly known as:  RESTASIS Place 1 drop into both eyes 2 (two) times daily.   enoxaparin 40 MG/0.4ML injection Commonly known as:  LOVENOX Inject 0.4 mLs (40 mg total) into  the skin daily. Start taking on:  09/06/2017   ferrous sulfate 325 (65 FE) MG tablet Take 325 mg by mouth daily with breakfast.   glimepiride 1 MG tablet Commonly known as:  AMARYL TK 1 T PO D FOR DIABETES   Olmesartan-amLODIPine-HCTZ 40-5-12.5 MG Tabs Take 1 tablet orally daily   pioglitazone 15 MG tablet Commonly known as:  ACTOS Take 1 tablet daily   Potassium 99 MG Tabs Take 1 tablet by mouth daily.   REFRESH TEARS 0.5 % Soln Generic drug:  carboxymethylcellulose Place 1 drop into both eyes 3 (three) times daily.   rosuvastatin 20 MG  tablet Commonly known as:  CRESTOR Take 20 mg by mouth daily.      Follow-up Information    Everitt Amber, MD Follow up on 09/26/2017.   Specialty:  Obstetrics and Gynecology Why:  at 3:45pm at the [facility] information: [place] [id number] [phone]           Greater than thirty minutes were spend for face to face discharge instructions and discharge orders/summary in EPIC.   Signed: Dorothyann Gibbs 09/05/2017, 2:04 PM

## 2017-09-05 NOTE — Discharge Instructions (Signed)
09/03/2017  Return to work: 6 weeks  Activity: 1. Be up and out of the bed during the day.  Take a nap if needed.  You may walk up steps but be careful and use the hand rail.  Stair climbing will tire you more than you think, you may need to stop part way and rest.   2. No lifting or straining for 6 weeks.  3. No driving for 2 weeks from surgery.  Do Not drive if you are taking narcotic pain medicine.  4. Shower daily.  Use soap and water on your incision and pat dry; don't rub.   5. No sexual activity and nothing in the vagina for 2 weeks.  Medications:  - Take ibuprofen and tylenol first line for pain control. Take these regularly (every 6 hours) to decrease the build up of pain.  - While taking pain medication you should take sennakot every night to reduce the likelihood of constipation. If this causes diarrhea, stop its use.  - take lovenox shots once daily for 28 days postop total (5 given in the hospital).   Diet: 1. Low sodium Heart Healthy Diet is recommended.  2. It is safe to use a laxative if you have difficulty moving your bowels.   Wound Care: 1. Keep clean and dry.  Shower daily.  Reasons to call the Doctor:   Fever - Oral temperature greater than 100.4 degrees Fahrenheit  Foul-smelling vaginal discharge  Difficulty urinating  Nausea and vomiting  Increased pain at the site of the incision that is unrelieved with pain medicine.  Difficulty breathing with or without chest pain  New calf pain especially if only on one side  Sudden, continuing increased vaginal bleeding with or without clots.   Follow-up: 1. See Everitt Amber in 3 weeks.  Contacts: For questions or concerns you should contact:  Dr. Everitt Amber at [phone]  After hours and on week-ends call [phone] and ask to speak to the physician on call for Gynecologic Oncology   How and Where to Give Subcutaneous Enoxaparin Injections Enoxaparin is an injectable medicine. It is used to  help prevent blood clots from developing in your veins. Health care providers often use anticoagulants like enoxaparin to prevent clots following surgery. Enoxaparin is also used in combination with other medicines to treat blood clots and heart attacks. If blood clots are left untreated, they can be life threatening. Enoxaparin comes in single-use syringes. You inject enoxaparin through a syringe into your belly (abdomen). You should change the injection site each time you give yourself a shot. Continue the enoxaparin injections as directed by your health care provider. Your health care provider will use blood clotting test results to decide when you can safely stop using enoxaparin injections. If your health care provider prescribes any additional medicines, use the medicines exactly as directed. How do I inject enoxaparin? 9. Wash your hands with soap and water. 10. Clean the selected injection site as directed by your health care provider. 11. Remove the needle cap by pulling it straight off the syringe. 12. When using a prefilled syringe, do not push the air bubble out of the syringe before the injection. The air bubble will help you get all of the medicine out of the syringe. 13. Hold the syringe like a pencil using your writing hand. 14. Use your other hand to pinch and hold an inch of the cleansed skin. 15. Insert the entire needle straight down into the fold of skin. 16. Push  the plunger with your thumb until the syringe is empty. 17. Pull the needle straight out of your skin. 18. Enoxaparin injection prefilled syringes and graduated prefilled syringes are available with a system that shields the needle after injection. After you have completed your injection and removed the needle from your skin, firmly push down on the plunger. The protective sleeve will automatically cover the needle and you will hear a click. The click means the needle is safely covered. 19. Place the syringe in the nearest  needle box, also called a sharps container. If you do not have a sharps container, you can use a hard-sided plastic container with a secure lid, such as an empty laundry detergent bottle. What else do I need to know?  Do not use enoxaparin if: ? You have allergies to heparin or pork products. ? You have been diagnosed with a condition called thrombocytopenia.  Do not use the syringe or needle more than one time.  Use medicines only as directed by your health care provider.  Changes in medicines, supplements, diet, and illness can affect your anticoagulation therapy. Be sure to inform your health care provider of any of these changes.  It is important that you tell all of your health care providers and your dentist that you are taking an anticoagulant, especially if you are injured or plan to have any type of procedure.  While on anticoagulants, you will need to have blood tests done routinely as directed by your health care provider.  While using this medicine, avoid physical activities or sports that could result in a fall or cause injury.  Follow up with your laboratory test and health care provider appointments as directed. It is very important to keep your appointments. Not keeping appointments could result in a chronic or permanent injury, pain, or disability.  Before giving your medicine, you should make sure the injection is a clear and colorless or pale yellow solution. If your medicine becomes discolored or if there are particles in the syringe, do not use it and notify your health care provider.  Keep your medicine safely stored at room temperature. Contact a health care provider if:  You develop any rashes on your skin.  You have large areas of bruising on your skin.  You have any worsening of the condition for which you take Enoxaparin.  You develop a fever. Get help right away if:  You develop bleeding problems such as: ? Bleeding from the gums or nose that does not  stop quickly. ? Vomiting blood or coughing up blood. ? Blood in your urine. ? Blood in your stool, or stool that has a dark, tarry, or coffee grounds appearance. ? A cut that does not stop bleeding within 10 minutes. These symptoms may represent a serious problem that is an emergency. Do not wait to see if the symptoms will go away. Get medical help right away. Call your local emergency services (911 in the [place] Do not drive yourself to the hospital. This information is not intended to replace advice given to you by your health care provider. Make sure you discuss any questions you have with your health care provider. Document Released: 02/25/2004 Document Revised: 12/31/2015 Document Reviewed: 10/10/2013 Elsevier Interactive Patient Education  2018 [employer].

## 2017-09-06 ENCOUNTER — Telehealth: Payer: Self-pay | Admitting: *Deleted

## 2017-09-06 NOTE — Telephone Encounter (Signed)
Returned the patient's call and rescheduled the appt for May 21st to May 31st

## 2017-09-25 ENCOUNTER — Encounter: Payer: Self-pay | Admitting: Gynecologic Oncology

## 2017-09-25 NOTE — Progress Notes (Signed)
Gynecologic Oncology Multi-Disciplinary Disposition Conference Note  Date of the Conference: Sep 25, 2017  Patient Name: Gloria Murphy   Primary GYN Oncologist: Dr. Denman George  Stage/Disposition:  Primary ovarian carcinoid tumor. Disposition is to gastrointestinal evaluation.  If negative, close follow up with periodic imaging recommended.   This Multidisciplinary conference took place involving physicians from [place], [place], Radiation Oncology, Pathology, Radiology along with the Gynecologic Oncology Nurse Practitioner and RN.  Comprehensive assessment of the patient's malignancy, staging, need for surgery, chemotherapy, radiation therapy, and need for further testing were reviewed. Supportive measures, both inpatient and following discharge were also discussed. The recommended plan of care is documented. Greater than 35 minutes were spent correlating and coordinating this patient's care.

## 2017-09-26 ENCOUNTER — Ambulatory Visit: Payer: Medicare Other | Admitting: Gynecologic Oncology

## 2017-10-06 ENCOUNTER — Inpatient Hospital Stay: Payer: Medicare Other

## 2017-10-06 ENCOUNTER — Inpatient Hospital Stay: Payer: Medicare Other | Attending: Gynecology | Admitting: Gynecologic Oncology

## 2017-10-06 ENCOUNTER — Encounter: Payer: Self-pay | Admitting: Gynecologic Oncology

## 2017-10-06 ENCOUNTER — Telehealth: Payer: Self-pay | Admitting: Oncology

## 2017-10-06 VITALS — BP 111/68 | HR 77 | Temp 97.9°F | Resp 18 | Ht 69.0 in | Wt 182.0 lb

## 2017-10-06 DIAGNOSIS — C7A00 Malignant carcinoid tumor of unspecified site: Secondary | ICD-10-CM | POA: Diagnosis not present

## 2017-10-06 DIAGNOSIS — Z90710 Acquired absence of both cervix and uterus: Secondary | ICD-10-CM | POA: Diagnosis not present

## 2017-10-06 DIAGNOSIS — R1900 Intra-abdominal and pelvic swelling, mass and lump, unspecified site: Secondary | ICD-10-CM | POA: Insufficient documentation

## 2017-10-06 DIAGNOSIS — Z7189 Other specified counseling: Secondary | ICD-10-CM

## 2017-10-06 LAB — CBC WITH DIFFERENTIAL (CANCER CENTER ONLY)
BASOS ABS: 0 10*3/uL (ref 0.0–0.1)
Basophils Relative: 1 %
EOS ABS: 0.2 10*3/uL (ref 0.0–0.5)
EOS PCT: 4 %
HCT: 35.3 % (ref 34.8–46.6)
Hemoglobin: 11.5 g/dL — ABNORMAL LOW (ref 11.6–15.9)
LYMPHS PCT: 29 %
Lymphs Abs: 1.2 10*3/uL (ref 0.9–3.3)
MCH: 27 pg (ref 25.1–34.0)
MCHC: 32.6 g/dL (ref 31.5–36.0)
MCV: 82.9 fL (ref 79.5–101.0)
Monocytes Absolute: 0.6 10*3/uL (ref 0.1–0.9)
Monocytes Relative: 15 %
Neutro Abs: 2.2 10*3/uL (ref 1.5–6.5)
Neutrophils Relative %: 51 %
Platelet Count: 194 10*3/uL (ref 145–400)
RBC: 4.26 MIL/uL (ref 3.70–5.45)
RDW: 13.6 % (ref 11.2–14.5)
WBC: 4.2 10*3/uL (ref 3.9–10.3)

## 2017-10-06 LAB — COMPREHENSIVE METABOLIC PANEL
ALT: 10 U/L (ref 0–55)
AST: 21 U/L (ref 5–34)
Albumin: 3.7 g/dL (ref 3.5–5.0)
Alkaline Phosphatase: 61 U/L (ref 40–150)
Anion gap: 10 (ref 3–11)
BILIRUBIN TOTAL: 0.4 mg/dL (ref 0.2–1.2)
BUN: 15 mg/dL (ref 7–26)
CO2: 26 mmol/L (ref 22–29)
Calcium: 9.9 mg/dL (ref 8.4–10.4)
Chloride: 102 mmol/L (ref 98–109)
Creatinine, Ser: 1.03 mg/dL (ref 0.60–1.10)
GFR, EST AFRICAN AMERICAN: 58 mL/min — AB (ref >60)
GFR, EST NON AFRICAN AMERICAN: 50 mL/min — AB (ref >60)
Glucose, Bld: 104 mg/dL (ref 70–140)
Potassium: 4.2 mmol/L (ref 3.5–5.1)
Sodium: 138 mmol/L (ref 136–145)
TOTAL PROTEIN: 8.4 g/dL — AB (ref 6.4–8.3)

## 2017-10-06 NOTE — Telephone Encounter (Signed)
GI appointment scheduled with Dr. Collene Mares on 10/12/17 at 10:15.  Message left for patient on her home phone.

## 2017-10-06 NOTE — Progress Notes (Signed)
Follow-up Note: Gyn-Onc   Gloria Murphy 82 y.o. female  Chief Complaint  Patient presents with  . Pelvic mass    Assessment : Clinical stage I left ovarian carcinoid tumor (low grade malignant).   Plan: Given her symptoms of dizziness and weakness on standing I think it is reasonable to rule out carcinoid syndrome by checking 24-hour urinary serotonin levels and serum chromogranin level today.  We will also have her seen by gastroenterology for colonoscopy and EGD to rule out an occult GI primary (no palpable apparent GI primaries were appreciated intraoperatively).  If the studies are all unremarkable for evidence of an extraovarian carcinoid disease, given her normal CT scans, I believe it is reasonable to follow her expectantly.  This plan was reviewed at our multidisciplinary conference.  We will check cbc and cmet today to look for postop issue as etiology of her symptoms.  I will follow Gloria Murphy annually with follow-up.  HPI: 82 year old African-American female seen regarding management of a newly diagnosed abdominal pelvic mass.  Apparently in the course of evaluation for venous problems an abdominal exam revealed this mass.  The patient reports that she really just thought she was getting fatter.  She denies any abdominal pain or pressure or any urinary frequency.  CT scan was obtained revealing a 21 x 17 x 15 cm abdominal pelvic mass which is complex in nature and not cystic.Gloria Murphy  There is minimal amount of ascites no evidence of adenopathy or any other metastatic disease.  Patient has a past history hysterectomy for menorrhagia.  She denies any family history of breast or ovarian cancer.  Review of Systems:10 point review of systems is negative except as noted in interval history.   Vitals: Blood pressure 111/68, pulse 77, temperature 97.9 F (36.6 C), temperature source Oral, resp. rate 18, height 5\' 9"  (1.753 m), weight 182 lb (82.6 kg), SpO2 98 %.  Physical Exam: General :  The patient is a healthy woman in no acute distress.  HEENT: normocephalic, extraoccular movements normal; neck is supple without thyromegally  Lynphnodes: Supraclavicular and inguinal nodes not enlarged  Abdomen: Soft, nontender. Well healed incision with no masses, hematomas, lesions.  Pelvic:  deferred Rectal: deferred Lower extremities: No edema or varicosities. Normal range of motion      No Known Allergies  Past Medical History:  Diagnosis Date  . Diabetes mellitus without complication ([place])   . High cholesterol   . Hypertension     Past Surgical History:  Procedure Laterality Date  . ABDOMINAL HYSTERECTOMY    . EYE SURGERY Bilateral 06/2016   Cataract  . LAPAROTOMY N/A 08/31/2017   Procedure: EXPLORATORY LAPAROTOMY; BILATERAL SALPINGOOPHERECTOMY; OMENTECTOMY, TUMOR DEBULKING;  Surgeon: Everitt Amber, MD;  Location: [facility];  Service: Gynecology;  Laterality: N/A;  . MASS EXCISION N/A 08/31/2017   Procedure: RESECTION OF PELVIC MASS;  Surgeon: Everitt Amber, MD;  Location: [facility];  Service: Gynecology;  Laterality: N/A;    Current Outpatient Medications  Medication Sig Dispense Refill  . Calcium Carbonate (CALTRATE 600 PO) Take 1 tablet by mouth daily.    . carboxymethylcellulose (REFRESH TEARS) 0.5 % SOLN Place 1 drop into both eyes 3 (three) times daily.    . cycloSPORINE (RESTASIS) 0.05 % ophthalmic emulsion Place 1 drop into both eyes 2 (two) times daily.    . ferrous sulfate 325 (65 FE) MG tablet Take 325 mg by mouth daily with breakfast.    . glimepiride (AMARYL) 1 MG tablet TK 1 T PO  D FOR DIABETES  0  . Olmesartan-amLODIPine-HCTZ 40-5-12.5 MG TABS Take 1 tablet orally daily  0  . pioglitazone (ACTOS) 15 MG tablet Take 1 tablet daily  0  . Potassium 99 MG TABS Take 1 tablet by mouth daily.    . rosuvastatin (CRESTOR) 20 MG tablet Take 20 mg by mouth daily.     No current facility-administered medications for this visit.     Social History   Socioeconomic History   . Marital status: Widowed    Spouse name: Not on file  . Number of children: Not on file  . Years of education: Not on file  . Highest education level: Not on file  Occupational History  . Not on file  Social Needs  . Financial resource strain: Not on file  . Food insecurity:    Worry: Not on file    Inability: Not on file  . Transportation needs:    Medical: Not on file    Non-medical: Not on file  Tobacco Use  . Smoking status: Never Smoker  . Smokeless tobacco: Never Used  Substance and Sexual Activity  . Alcohol use: No  . Drug use: No  . Sexual activity: Not on file  Lifestyle  . Physical activity:    Days per week: Not on file    Minutes per session: Not on file  . Stress: Not on file  Relationships  . Social connections:    Talks on phone: Not on file    Gets together: Not on file    Attends religious service: Not on file    Active member of club or organization: Not on file    Attends meetings of clubs or organizations: Not on file    Relationship status: Not on file  . Intimate partner violence:    Fear of current or ex partner: Not on file    Emotionally abused: Not on file    Physically abused: Not on file    Forced sexual activity: Not on file  Other Topics Concern  . Not on file  Social History Narrative  . Not on file    History reviewed. No pertinent family history.   30 minutes of direct face to face counseling time was spent with the patient. This included discussion about prognosis, therapy recommendations and postoperative side effects and are beyond the scope of routine postoperative care.   Thereasa Solo, MD 10/06/2017, 4:37 PM

## 2017-10-06 NOTE — Patient Instructions (Addendum)
Follow up with Dr. Denman George in one year. Call in January 2020 to schedule the appointment. Call [phone]  We will call you with an appointment with Dr. Juanita Craver, you GI doctor. We will call you with your lab results once they are resulted.

## 2017-10-09 ENCOUNTER — Telehealth: Payer: Self-pay | Admitting: Oncology

## 2017-10-09 LAB — CHROMOGRANIN A: Chromogranin A: 2 nmol/L (ref 0–5)

## 2017-10-09 NOTE — Telephone Encounter (Signed)
Called patient to make sure Gloria Murphy was aware of her appointment time with Dr. Collene Mares on Thursday, 10/12/17.  Gloria Murphy said Gloria Murphy is aware of the appointment and time.

## 2017-10-12 DIAGNOSIS — R1900 Intra-abdominal and pelvic swelling, mass and lump, unspecified site: Secondary | ICD-10-CM | POA: Diagnosis not present

## 2017-10-12 DIAGNOSIS — D509 Iron deficiency anemia, unspecified: Secondary | ICD-10-CM | POA: Diagnosis not present

## 2017-10-12 DIAGNOSIS — Z1211 Encounter for screening for malignant neoplasm of colon: Secondary | ICD-10-CM | POA: Diagnosis not present

## 2017-10-12 DIAGNOSIS — C7A00 Malignant carcinoid tumor of unspecified site: Secondary | ICD-10-CM | POA: Diagnosis not present

## 2017-10-15 LAB — 5 HIAA, QUANTITATIVE, URINE, 24 HOUR
5-HIAA, Ur: 6.6 mg/L
5-HIAA,Quant.,24 Hr Urine: 2.6 mg/24 hr (ref 0.0–14.9)
Total Volume: 400

## 2017-10-25 ENCOUNTER — Ambulatory Visit: Payer: Self-pay | Admitting: *Deleted

## 2017-10-25 ENCOUNTER — Telehealth: Payer: Self-pay

## 2017-10-25 ENCOUNTER — Encounter: Payer: Self-pay | Admitting: *Deleted

## 2017-10-25 ENCOUNTER — Ambulatory Visit (HOSPITAL_COMMUNITY)
Admission: EM | Admit: 2017-10-25 | Discharge: 2017-10-25 | Disposition: A | Discharge status: Home / Self Care | Payer: Medicare Other | Attending: Internal Medicine | Admitting: Internal Medicine

## 2017-10-25 ENCOUNTER — Encounter (HOSPITAL_COMMUNITY): Payer: Self-pay | Admitting: Family Medicine

## 2017-10-25 DIAGNOSIS — Z7984 Long term (current) use of oral hypoglycemic drugs: Secondary | ICD-10-CM | POA: Insufficient documentation

## 2017-10-25 DIAGNOSIS — R05 Cough: Secondary | ICD-10-CM

## 2017-10-25 DIAGNOSIS — Z79899 Other long term (current) drug therapy: Secondary | ICD-10-CM | POA: Insufficient documentation

## 2017-10-25 DIAGNOSIS — R1900 Intra-abdominal and pelvic swelling, mass and lump, unspecified site: Secondary | ICD-10-CM | POA: Insufficient documentation

## 2017-10-25 DIAGNOSIS — I10 Essential (primary) hypertension: Secondary | ICD-10-CM | POA: Diagnosis not present

## 2017-10-25 DIAGNOSIS — R42 Dizziness and giddiness: Secondary | ICD-10-CM | POA: Diagnosis not present

## 2017-10-25 DIAGNOSIS — R0602 Shortness of breath: Secondary | ICD-10-CM | POA: Insufficient documentation

## 2017-10-25 DIAGNOSIS — C7A00 Malignant carcinoid tumor of unspecified site: Secondary | ICD-10-CM | POA: Insufficient documentation

## 2017-10-25 DIAGNOSIS — Z90710 Acquired absence of both cervix and uterus: Secondary | ICD-10-CM | POA: Insufficient documentation

## 2017-10-25 DIAGNOSIS — E7800 Pure hypercholesterolemia, unspecified: Secondary | ICD-10-CM | POA: Diagnosis not present

## 2017-10-25 DIAGNOSIS — Z9889 Other specified postprocedural states: Secondary | ICD-10-CM | POA: Diagnosis not present

## 2017-10-25 DIAGNOSIS — E119 Type 2 diabetes mellitus without complications: Secondary | ICD-10-CM | POA: Insufficient documentation

## 2017-10-25 DIAGNOSIS — R059 Cough, unspecified: Secondary | ICD-10-CM

## 2017-10-25 LAB — POCT URINALYSIS DIP (DEVICE)
BILIRUBIN URINE: NEGATIVE
GLUCOSE, UA: NEGATIVE mg/dL
HGB URINE DIPSTICK: NEGATIVE
Ketones, ur: NEGATIVE mg/dL
NITRITE: NEGATIVE
Protein, ur: NEGATIVE mg/dL
Specific Gravity, Urine: 1.02 (ref 1.005–1.030)
Urobilinogen, UA: 0.2 mg/dL (ref 0.0–1.0)
pH: 5.5 (ref 5.0–8.0)

## 2017-10-25 LAB — CBC
HEMATOCRIT: 35.8 % — AB (ref 36.0–46.0)
Hemoglobin: 11.3 g/dL — ABNORMAL LOW (ref 12.0–15.0)
MCH: 26.8 pg (ref 26.0–34.0)
MCHC: 31.6 g/dL (ref 30.0–36.0)
MCV: 85 fL (ref 78.0–100.0)
Platelets: 203 10*3/uL (ref 150–400)
RBC: 4.21 MIL/uL (ref 3.87–5.11)
RDW: 13.7 % (ref 11.5–15.5)
WBC: 3.9 10*3/uL — AB (ref 4.0–10.5)

## 2017-10-25 LAB — POCT I-STAT, CHEM 8
BUN: 13 mg/dL (ref 6–20)
Calcium, Ion: 1.27 mmol/L (ref 1.15–1.40)
Chloride: 100 mmol/L — ABNORMAL LOW (ref 101–111)
Creatinine, Ser: 0.9 mg/dL (ref 0.44–1.00)
GLUCOSE: 106 mg/dL — AB (ref 65–99)
HEMATOCRIT: 34 % — AB (ref 36.0–46.0)
Hemoglobin: 11.6 g/dL — ABNORMAL LOW (ref 12.0–15.0)
POTASSIUM: 3.8 mmol/L (ref 3.5–5.1)
SODIUM: 140 mmol/L (ref 135–145)
TCO2: 27 mmol/L (ref 22–32)

## 2017-10-25 MED ORDER — CETIRIZINE HCL 10 MG PO CAPS: 10.0000 mg | ORAL_CAPSULE | Freq: Every day | ORAL | 0 refills | Status: DC

## 2017-10-25 MED ORDER — BENZONATATE 200 MG PO CAPS
200.0000 mg | ORAL_CAPSULE | Freq: Three times a day (TID) | ORAL | 0 refills | Status: AC | PRN
Start: 2017-10-25 — End: 2017-11-01

## 2017-10-25 MED ORDER — AZITHROMYCIN 250 MG PO TABS: 250.0000 mg | ORAL_TABLET | Freq: Every day | ORAL | 0 refills | Status: DC

## 2017-10-25 MED ORDER — OMEPRAZOLE 20 MG PO CPDR: 20.0000 mg | DELAYED_RELEASE_CAPSULE | Freq: Every day | ORAL | 0 refills | Status: DC

## 2017-10-25 NOTE — ED Provider Notes (Signed)
[name]    CSN: [id number] Arrival date & time: 10/25/17  1800     History   Chief Complaint Chief Complaint  Patient presents with  . Dizziness    HPI Gloria Murphy is a 82 y.o. female history of hypertension, hyperlipidemia and DM type II, presenting today for evaluation of dizziness and feeling weak.  Patient symptoms began approximately 2 days ago.  She states that she has had sensations as if she is going to pass out.  Denies any episodes of syncope.  Denies any falls.  Denies changes in vision, one-sided weakness, multi-speaking or slurring speech.  Patient does note that she has had cold symptoms for approximately 1 month, she has had a productive cough, sore throat off-and-on as well as rhinorrhea.  She has been taking ibuprofen without relief.  Denies chest pain, but does note an increase in shortness of breath.  States her blood sugars have been in control, typically run from low 100s-110.  Patient also notes that she has had an increase in belching recently and is requesting something for reflux.  She does not take anything currently.  HPI  Past Medical History:  Diagnosis Date  . Diabetes mellitus without complication ([place])   . High cholesterol   . Hypertension     Patient Active Problem List   Diagnosis Date Noted  . Malignant carcinoid tumor of unknown primary site ([place]) 10/06/2017  . Pelvic mass 08/31/2017  . Elevated CA-125 08/31/2017  . Pelvic mass in female 08/31/2017    Past Surgical History:  Procedure Laterality Date  . ABDOMINAL HYSTERECTOMY    . EYE SURGERY Bilateral 06/2016   Cataract  . LAPAROTOMY N/A 08/31/2017   Procedure: EXPLORATORY LAPAROTOMY; BILATERAL SALPINGOOPHERECTOMY; OMENTECTOMY, TUMOR DEBULKING;  Surgeon: Everitt Amber, MD;  Location: [facility];  Service: Gynecology;  Laterality: N/A;  . MASS EXCISION N/A 08/31/2017   Procedure: RESECTION OF PELVIC MASS;  Surgeon: Everitt Amber, MD;  Location: [facility];  Service: Gynecology;   Laterality: N/A;    OB History   None      Home Medications    Prior to Admission medications   Medication Sig Start Date End Date Taking? Authorizing Provider  azithromycin (ZITHROMAX) 250 MG tablet Take 1 tablet (250 mg total) by mouth daily. Take first 2 tablets together, then 1 every day until finished. 10/25/17   Mykai Wendorf C, PA-C  benzonatate (TESSALON) 200 MG capsule Take 1 capsule (200 mg total) by mouth 3 (three) times daily as needed for up to 7 days for cough. 10/25/17 11/01/17  Ardenia Stiner C, PA-C  Calcium Carbonate (CALTRATE 600 PO) Take 1 tablet by mouth daily.    [provider]  carboxymethylcellulose (REFRESH TEARS) 0.5 % SOLN Place 1 drop into both eyes 3 (three) times daily.    [provider]  Cetirizine HCl 10 MG CAPS Take 1 capsule (10 mg total) by mouth daily for 10 days. 10/25/17 11/04/17  Elimelech Houseman C, PA-C  cycloSPORINE (RESTASIS) 0.05 % ophthalmic emulsion Place 1 drop into both eyes 2 (two) times daily.    [provider]  ferrous sulfate 325 (65 FE) MG tablet Take 325 mg by mouth daily with breakfast.    [provider]  glimepiride (AMARYL) 1 MG tablet TK 1 T PO D FOR DIABETES 05/13/17   [provider]  Olmesartan-amLODIPine-HCTZ 40-5-12.5 MG TABS Take 1 tablet orally daily 07/31/17   [provider]  omeprazole (PRILOSEC) 20 MG capsule Take 1 capsule (  20 mg total) by mouth daily. 10/25/17   Barbee Mamula C, PA-C  pioglitazone (ACTOS) 15 MG tablet Take 1 tablet daily 05/29/17   [provider]  Potassium 99 MG TABS Take 1 tablet by mouth daily.    [provider]  rosuvastatin (CRESTOR) 20 MG tablet Take 20 mg by mouth daily.    [provider]    Family History History reviewed. No pertinent family history.  Social History Social History   Tobacco Use  . Smoking status: Never Smoker  . Smokeless tobacco: Never Used  Substance Use Topics  . Alcohol use: No  .  Drug use: No     Allergies   Patient has no known allergies.   Review of Systems Review of Systems  Constitutional: Negative for chills, fatigue and fever.  HENT: Positive for congestion, rhinorrhea and sore throat. Negative for ear pain, sinus pressure and trouble swallowing.   Respiratory: Positive for cough. Negative for chest tightness and shortness of breath.   Cardiovascular: Negative for chest pain.  Gastrointestinal: Negative for abdominal pain, nausea and vomiting.  Musculoskeletal: Negative for myalgias.  Skin: Negative for rash.  Neurological: Positive for dizziness and light-headedness. Negative for headaches.     Physical Exam Triage Vital Signs ED Triage Vitals  Enc Vitals Group     BP 10/25/17 1822 (!) 125/57     Pulse Rate 10/25/17 1822 81     Resp --      Temp 10/25/17 1822 97.8 F (36.6 C)     Temp src --      SpO2 10/25/17 1822 100 %     Weight --      Height --      Head Circumference --      Peak Flow --      Pain Score 10/25/17 1820 0     Pain Loc --      Pain Edu? --      Excl. in [place]? --    Orthostatic VS for the past 24 hrs:  BP- Lying Pulse- Lying BP- Sitting Pulse- Sitting BP- Standing at 0 minutes Pulse- Standing at 0 minutes  10/25/17 1920 99/62 80 111/64 75 112/66 77    Updated Vital Signs BP (!) 125/57   Pulse 81   Temp 97.8 F (36.6 C)   SpO2 100%   Visual Acuity Right Eye Distance:   Left Eye Distance:   Bilateral Distance:    Right Eye Near:   Left Eye Near:    Bilateral Near:     Physical Exam  Constitutional: She is oriented to person, place, and time. She appears well-developed and well-nourished. No distress.  HENT:  Head: Normocephalic and atraumatic.  Mouth/Throat: Oropharynx is clear and moist.  Bilateral ears without tenderness to palpation of external auricle, tragus and mastoid, EAC's without erythema or swelling, TM's with good bony landmarks and cone of light. Non erythematous.  Oral mucosa pink and  moist, no tonsillar enlargement or exudate. Posterior pharynx patent and nonerythematous, no uvula deviation or swelling. Normal phonation.   Eyes: Pupils are equal, round, and reactive to light. Conjunctivae and EOM are normal.  Wearing glasses  Neck: Neck supple.  Cardiovascular: Normal rate and regular rhythm.  No murmur heard. Pulmonary/Chest: Effort normal and breath sounds normal. No respiratory distress.  Abdominal: Soft. There is no tenderness.  Musculoskeletal: She exhibits no edema.  Neurological: She is alert and oriented to person, place, and time.  Patient A&O x3, cranial nerves II-XII grossly  intact, strength at shoulders, hips and knees 5/5, equal bilaterally, patellar reflex 2+ bilaterally. Normal Finger to nose, RAM and heel to shin. Negative Romberg and Pronator Drift. Gait without abnormality.  Skin: Skin is warm and dry.  Psychiatric: She has a normal mood and affect.  Nursing note and vitals reviewed.    UC Treatments / Results  Labs (all labs ordered are listed, but only abnormal results are displayed) Labs Reviewed  CBC - Abnormal; Notable for the following components:      Result Value   WBC 3.9 (*)    Hemoglobin 11.3 (*)    HCT 35.8 (*)    All other components within normal limits  POCT URINALYSIS DIP (DEVICE) - Abnormal; Notable for the following components:   Leukocytes, UA SMALL (*)    All other components within normal limits  POCT I-STAT, CHEM 8 - Abnormal; Notable for the following components:   Chloride 100 (*)    Glucose, Bld 106 (*)    Hemoglobin 11.6 (*)    HCT 34.0 (*)    All other components within normal limits  URINE CULTURE    EKG None  Radiology No results found.  Procedures Procedures (including critical care time)  Medications Ordered in UC Medications - No data to display  Initial Impression / Assessment and Plan / UC Course  I have reviewed the triage vital signs and the nursing notes.  Pertinent labs & imaging  results that were available during my care of the patient were reviewed by me and considered in my medical decision making (see chart for details).     Patient with dizziness, orthostatic vital signs negative, EKG normal sinus rhythm, nonspecific T wave inversions in lead V1.  No other signs of acute ischemia or infarction.  I-STAT with mild abnormalities, will have her increase dietary iron as well as drink more fluids for possible dehydration as cause of symptoms.  Discussed eating more she has not had much of an appetite.  Current cold symptoms have been going on for 1 month we will also treat this with azithromycin as well as Zyrtec and Tessalon.Discussed strict return precautions. Patient verbalized understanding and is agreeable with plan.  Final Clinical Impressions(s) / UC Diagnoses   Final diagnoses:  Lightheadedness  Cough     Discharge Instructions     All work-up was negative for your lightheadedness.  Please drink plenty of water, try to drink 8 to 10 glasses of water daily.  Please also try to increase your food intake and eating when you are not having an appetite.  Please begin Prilosec daily for reflux symptoms  Since her cold symptoms have been going on for approximately 1 month, please begin azithromycin-2 tablets today, 1 tablet for the following 4 days.  Please use Tessalon as needed for cough, begin Zyrtec daily for congestion and drainage contributing to sore throat.  Please follow-up with your PCP or here if symptoms persisting without improvement  Please go to emergency room if having episodes of passing out, symptoms worsening.   ED Prescriptions    Medication Sig Dispense Auth. Provider   omeprazole (PRILOSEC) 20 MG capsule Take 1 capsule (20 mg total) by mouth daily. 30 capsule Ashonte Angelucci C, PA-C   benzonatate (TESSALON) 200 MG capsule Take 1 capsule (200 mg total) by mouth 3 (three) times daily as needed for up to 7 days for cough. 20 capsule Lona Six,  Kyion Gautier C, PA-C   azithromycin (ZITHROMAX) 250 MG tablet Take 1 tablet (250  mg total) by mouth daily. Take first 2 tablets together, then 1 every day until finished. 6 tablet Paulyne Mooty C, PA-C   Cetirizine HCl 10 MG CAPS Take 1 capsule (10 mg total) by mouth daily for 10 days. 10 capsule Justinn Welter C, PA-C     Controlled Substance Prescriptions [place] Controlled Substance Registry consulted? Not Applicable   Janith Lima, [place] 10/26/17 1117

## 2017-10-25 NOTE — Telephone Encounter (Signed)
Bs 148 Just taken  Ate a meal prior to  Pt advised to go to urgent care tonight pt states she will  Pt given precautions to get up slowly and take her time walking Directions to [facility] urgent care given   Reason for Disposition . [1] MODERATE dizziness (e.g., interferes with normal activities) AND [2] has NOT been evaluated by physician for this  (Exception: dizziness caused by heat exposure, sudden standing, or poor fluid intake)  Answer Assessment - Initial Assessment Questions 1. DESCRIPTION: "Describe your dizziness."      Lightheaded   2. LIGHTHEADED: "Do you feel lightheaded?" (e.g., somewhat faint, woozy, weak upon standing)        Woozy weak worse when standing up  3. VERTIGO: "Do you feel like either you or the room is spinning or tilting?" (i.e. vertigo)       No    4. SEVERITY: "How bad is it?"  "Do you feel like you are going to faint?" "Can you stand and walk?"   - MILD - walking normally   - MODERATE - interferes with normal activities (e.g., work, school)    - SEVERE - unable to stand, requires support to walk, feels like passing out now.          Moderate    5. ONSET:  "When did the dizziness begin?"         2  DAY AGO  6. AGGRAVATING FACTORS: "Does anything make it worse?" (e.g., standing, change in head position)       Standing makes it worse  7. HEART RATE: "Can you tell me your heart rate?" "How many beats in 15 seconds?"  (Note: not all patients can do this)          Not able to   8. CAUSE: "What do you think is causing the dizziness?"         Perhaps  A  Sinus a sinus infection   9. RECURRENT SYMPTOM: "Have you had dizziness before?" If so, ask: "When was the last time?" "What happened that time?"          None  10. OTHER SYMPTOMS: "Do you have any other symptoms?" (e.g., fever, chest pain, vomiting, diarrhea, bleeding)          No   11. PREGNANCY: "Is there any chance you are pregnant?" "When was your last menstrual period?"       no  Protocols used:  DIZZINESS [facility]

## 2017-10-25 NOTE — Telephone Encounter (Signed)
Returned pt's call regarding ongoing dizziness and said she has fullness in her forehead and thinks she might have a sinus infection.  Notified Joylene John NP.  Reviewed Dr Serita Grit notes from pt's last visit regarding dizziness.  Per Lenna Sciara NP - referred pt to her PCP.  Pt reports her PCP as Dr Lucianne Lei and pt would like me to assist in her getting a f/u there, said her last appt was in April.  Called Dr Fransico Setters office at [phone] and spoke with Elvin So and made appt for pt for next Thursday June 27th at 10:15 am.  I faxed most recent notes from Dr Denman George appt and lab work to Dr Criss Rosales office fax at [phone]. Called pt back, pt agreeable.  I let her know if symptoms worsen before her appt with Dr Criss Rosales, she could go to a urgent care. Pt voiced understanding. No other needs per pt at this time.

## 2017-10-25 NOTE — Telephone Encounter (Signed)
Duplicate encounter di This encounter was created in error - please disregard.

## 2017-10-25 NOTE — ED Triage Notes (Addendum)
Pt with hx of diabetes here for intermittent dizziness since Monday. sts some intermittent headaches. sts some SOB but denies chest pain. sts also coughing and congestion. Denies fever or urinary symptoms. Blood sugar has been normal.

## 2017-10-25 NOTE — Discharge Instructions (Signed)
All work-up was negative for your lightheadedness.  Please drink plenty of water, try to drink 8 to 10 glasses of water daily.  Please also try to increase your food intake and eating when you are not having an appetite.  Please begin Prilosec daily for reflux symptoms  Since her cold symptoms have been going on for approximately 1 month, please begin azithromycin-2 tablets today, 1 tablet for the following 4 days.  Please use Tessalon as needed for cough, begin Zyrtec daily for congestion and drainage contributing to sore throat.  Please follow-up with your PCP or here if symptoms persisting without improvement  Please go to emergency room if having episodes of passing out, symptoms worsening.

## 2017-10-27 LAB — URINE CULTURE

## 2017-10-31 ENCOUNTER — Telehealth: Payer: Self-pay

## 2017-10-31 NOTE — Telephone Encounter (Signed)
Outgoing call per Dr Denman George regarding inbasket message received:  Dear Elgie Congo,  Can you let Ms Slingerland know that her tumor markers in her blood and urine are normal and do not suggest that there is residual carcinoid tumor that requires additional treatment at this time.  Everitt Amber     No answer, left VM to contact our office.

## 2017-10-31 NOTE — Telephone Encounter (Signed)
Spoke with Ms Gunnarson and told her the results of the lab results as noted below by Dr. Denman George.

## 2017-11-01 ENCOUNTER — Encounter: Payer: Self-pay | Admitting: Gynecologic Oncology

## 2017-11-01 DIAGNOSIS — D509 Iron deficiency anemia, unspecified: Secondary | ICD-10-CM | POA: Diagnosis not present

## 2017-11-01 DIAGNOSIS — Z1211 Encounter for screening for malignant neoplasm of colon: Secondary | ICD-10-CM | POA: Diagnosis not present

## 2017-11-02 DIAGNOSIS — I10 Essential (primary) hypertension: Secondary | ICD-10-CM | POA: Diagnosis not present

## 2017-11-02 DIAGNOSIS — E7800 Pure hypercholesterolemia, unspecified: Secondary | ICD-10-CM | POA: Diagnosis not present

## 2017-11-02 DIAGNOSIS — E785 Hyperlipidemia, unspecified: Secondary | ICD-10-CM | POA: Diagnosis not present

## 2017-11-02 DIAGNOSIS — C7A098 Malignant carcinoid tumors of other sites: Secondary | ICD-10-CM | POA: Diagnosis not present

## 2017-11-02 DIAGNOSIS — E1169 Type 2 diabetes mellitus with other specified complication: Secondary | ICD-10-CM | POA: Diagnosis not present

## 2017-11-13 DIAGNOSIS — C7A098 Malignant carcinoid tumors of other sites: Secondary | ICD-10-CM | POA: Diagnosis not present

## 2017-11-30 DIAGNOSIS — K219 Gastro-esophageal reflux disease without esophagitis: Secondary | ICD-10-CM | POA: Diagnosis not present

## 2017-11-30 DIAGNOSIS — D509 Iron deficiency anemia, unspecified: Secondary | ICD-10-CM | POA: Diagnosis not present

## 2018-01-15 DIAGNOSIS — E785 Hyperlipidemia, unspecified: Secondary | ICD-10-CM | POA: Diagnosis not present

## 2018-01-15 DIAGNOSIS — C7A098 Malignant carcinoid tumors of other sites: Secondary | ICD-10-CM | POA: Diagnosis not present

## 2018-01-15 DIAGNOSIS — E119 Type 2 diabetes mellitus without complications: Secondary | ICD-10-CM | POA: Diagnosis not present

## 2018-02-01 ENCOUNTER — Encounter: Payer: Self-pay | Admitting: Family

## 2018-02-01 ENCOUNTER — Ambulatory Visit: Payer: Medicare Other | Admitting: Family

## 2018-02-01 ENCOUNTER — Other Ambulatory Visit: Payer: Self-pay

## 2018-02-01 ENCOUNTER — Ambulatory Visit (HOSPITAL_COMMUNITY)
Admission: RE | Admit: 2018-02-01 | Discharge: 2018-02-01 | Disposition: A | Discharge status: Home / Self Care | Payer: Medicare Other | Source: Ambulatory Visit | Attending: Family | Admitting: Family

## 2018-02-01 VITALS — BP 119/71 | HR 70 | Temp 97.0°F | Resp 18 | Ht 69.0 in | Wt 187.0 lb

## 2018-02-01 DIAGNOSIS — I739 Peripheral vascular disease, unspecified: Secondary | ICD-10-CM

## 2018-02-01 DIAGNOSIS — M79672 Pain in left foot: Secondary | ICD-10-CM | POA: Diagnosis not present

## 2018-02-01 DIAGNOSIS — M79671 Pain in right foot: Secondary | ICD-10-CM | POA: Insufficient documentation

## 2018-02-01 DIAGNOSIS — R0989 Other specified symptoms and signs involving the circulatory and respiratory systems: Secondary | ICD-10-CM

## 2018-02-01 DIAGNOSIS — E785 Hyperlipidemia, unspecified: Secondary | ICD-10-CM | POA: Diagnosis not present

## 2018-02-01 DIAGNOSIS — E119 Type 2 diabetes mellitus without complications: Secondary | ICD-10-CM | POA: Diagnosis not present

## 2018-02-01 DIAGNOSIS — R609 Edema, unspecified: Secondary | ICD-10-CM | POA: Diagnosis not present

## 2018-02-01 DIAGNOSIS — I779 Disorder of arteries and arterioles, unspecified: Secondary | ICD-10-CM | POA: Insufficient documentation

## 2018-02-01 DIAGNOSIS — I10 Essential (primary) hypertension: Secondary | ICD-10-CM | POA: Insufficient documentation

## 2018-02-01 NOTE — Patient Instructions (Signed)
To measure for knee high compression hose: Measure the length of calf (from the crease of the knee to the bottom of the heel), largest circumference of calf, and ankle circumference first thing in the morning before your legs have a chance to swell.  Take these 3 measurements with you to obtain 20-30 mm mercury graduated knee high compression hose.  Put the stockings on in the morning, remove at bedtime.   

## 2018-02-01 NOTE — Progress Notes (Signed)
[facility] [place]   CC: Follow up peripheral artery occlusive disease  History of Present Illness Gloria Murphy is a 82 y.o. female who was initially evaluated for c/o of bilateral pain and swelling in both feet. This had been present for several years. She also complains of pain in her right knee with walking. She also has right ankle pain. She does not really describe claudication. She did have a right knee meniscus tear in the past. She has otherwise had no operationsother than abdominal hysterectomy.  Atherosclerotic risk factors include diabetes diagnosedabout 2008, but since she has lost weight after the April 2019 laparotomy, her A1C is normal. Elevated cholesterol hypertension. She denies tobacco use. She is on a statin. She is not on aspirin. This has caused nosebleeds in the past. She denies any prior history of DVT.  Dr. Oneida Alar last evaluated pt on 01-12-17. At that timenormal noninvasive arterial exam despite difficult to palpate right pedal pulses. Dr. Glendon Axe Dr. Geroge Baseman that this is most likely secondary to edema. The patient may have some element of venous reflux contributing to her swelling symptoms. However, her pain symptoms seem to be more degenerative joint disease in nature. Dr. Belva Bertin withthe patient the pathophysiology of venous reflux disease.Healso prescribed for her lower extremity compression stockings for symptomatic relief. She may have some element of mild peripheral arterial disease as well. However, she does not really seem limited by her symptoms at that time. The patient was tofollow-up with [place] in 6 months time with an arterial duplex of her lower extremities. If this is a normal exam she may not need further workup. She will try to walk 30 minutes daily. She will also wear her compression stockings during the day.  She used to wear knee high compression hose and states it helps reduce the swelling in her  legs.  She states the mild swelling in her lower legs resolves by morning with overnight elevation.   Her walking is limited by her lumbar spine and arthitisissues, does not indicate claudication. She denies any known hx of stroke or TIA.  She had a laparotomy and excision of pelvic mass by Dr. Everitt Amber on 08-31-17. Pt states no cancer was found.  She has had some intermittent fatigue since the April 2019 surgery, states she has discussed this with her PCP.   She denies abdominal pain.  Diabetic: Yes, her last A1C was 5.4 on 08-28-17 (review of records) Tobacco use:non-smoker. She worked at a cigarette factory for 27 years, handled tobacco  Pt meds include: Statin :Yes Betablocker:No ASA:No, caused nose bleeds Other anticoagulants/antiplatelets:no    Past Medical History:  Diagnosis Date  . Diabetes mellitus without complication ([place])   . High cholesterol   . Hypertension     Social History Social History   Tobacco Use  . Smoking status: Never Smoker  . Smokeless tobacco: Never Used  Substance Use Topics  . Alcohol use: No  . Drug use: No    Family History History reviewed. No pertinent family history.  Past Surgical History:  Procedure Laterality Date  . ABDOMINAL HYSTERECTOMY    . EYE SURGERY Bilateral 06/2016   Cataract  . LAPAROTOMY N/A 08/31/2017   Procedure: EXPLORATORY LAPAROTOMY; BILATERAL SALPINGOOPHERECTOMY; OMENTECTOMY, TUMOR DEBULKING;  Surgeon: Everitt Amber, MD;  Location: [facility];  Service: Gynecology;  Laterality: N/A;  . MASS EXCISION N/A 08/31/2017   Procedure: RESECTION OF PELVIC MASS;  Surgeon: Everitt Amber, MD;  Location: [facility];  Service: Gynecology;  Laterality: N/A;    No Known Allergies  Current Outpatient Medications  Medication Sig Dispense Refill  . Calcium Carbonate (CALTRATE 600 PO) Take 1 tablet by mouth daily.    . carboxymethylcellulose (REFRESH TEARS) 0.5 % SOLN Place 1 drop into both eyes 3 (three) times daily.    .  ferrous sulfate 325 (65 FE) MG tablet Take 325 mg by mouth daily with breakfast.    . glimepiride (AMARYL) 1 MG tablet TK 1 T PO D FOR DIABETES  0  . Olmesartan-amLODIPine-HCTZ 40-5-12.5 MG TABS Take 1 tablet orally daily  0  . omeprazole (PRILOSEC) 20 MG capsule Take 1 capsule (20 mg total) by mouth daily. 30 capsule 0  . pioglitazone (ACTOS) 15 MG tablet Take 1 tablet daily  0  . Potassium 99 MG TABS Take 1 tablet by mouth daily.    . rosuvastatin (CRESTOR) 20 MG tablet Take 20 mg by mouth daily.    [name] azithromycin (ZITHROMAX) 250 MG tablet Take 1 tablet (250 mg total) by mouth daily. Take first 2 tablets together, then 1 every day until finished. (Patient not taking: Reported on 02/01/2018) 6 tablet 0  . Cetirizine HCl 10 MG CAPS Take 1 capsule (10 mg total) by mouth daily for 10 days. 10 capsule 0  . cycloSPORINE (RESTASIS) 0.05 % ophthalmic emulsion Place 1 drop into both eyes 2 (two) times daily.     No current facility-administered medications for this visit.     ROS: See HPI for pertinent positives and negatives.   Physical Examination  Vitals:   02/01/18 1514  BP: 119/71  Pulse: 70  Resp: 18  Temp: (!) 97 F (36.1 C)  TempSrc: Oral  SpO2: 99%  Weight: 187 lb (84.8 kg)  Height: 5\' 9"  (1.753 m)   Body mass index is 27.62 kg/m.  General: A&O x 3, WDWN,female. Gait:normal HENT: No gross abnormalities.  Eyes: PERRLA. Pink, normal conjunctiva.   Pulmonary: Respirations are non labored; rales in right posterior fields with limited air movement, no cough, no rhonchi or wheezes.  Cardiac:regularrhythm, no detected murmur.    Carotid Bruits Right Left   Negative Negative   Radial pulses are2+palpable bilaterally  Adominal aortic pulse isnotpalpable   VASCULAR EXAM: Extremitieswithoutischemic changes, withoutGangrene; open wounds.Bilateral pretibial and ankles with 1+ non pitting edema. Moderate hemosiderin deposits in both  lower legs.  LE Pulses Right Left  FEMORAL 2+palpable 2+palpable   POPLITEAL notpalpable  notpalpable  POSTERIOR TIBIAL notpalpable  notpalpable   DORSALIS PEDIS ANTERIOR TIBIAL 1+palpable  1+palpable    Abdomen: soft, NT,normal bowel sounds, no palpable masses, well healed midline abdominal scar Skin: no rashes, no cellulitis, no ulcers noted. Musculoskeletal: no muscle wasting or atrophy. Neurologic: A&O X 3; appropriate affect, Sensation is normal; MOTOR FUNCTION: moving all extremities equally, motor strength 5/5 throughout. Speech is fluent/normal. CN 2-12 intact. Psychiatric: Thought content is normal, mood appropriate for clinical situation     ASSESSMENT: MAJESTIC MOLONY is a 82 y.o. female who presents with normal bilateral ABI's, and pain in her low back and occasional sciatic pain, with history of known low back problems.  Her walking is limited by low back issues and arthritis pain.  There are no signs of ischemia in her feet or legs.   Rales in right posterior fields with no cough, no dyspnea; but she does have intermittent fatigue since the April 2019 laparotomy.  I advised pt to consult her PCP re the congestion heard in her lungs.   She  has some mild dependent pretibial and ankle edema that resolves with overnight elevation of her legs: Knee high compression hose during the day.     DATA  ABI (Date: 02/01/2018):  R:   ABI: 1.18 (was 1.09 on 07-13-17),   PT: bi  DP: tri  TBI:  0.50, toe pressure 63 (was 0.68)  L:   ABI: 1.07 (was 1.12),   PT: tri  DP: bi  TBI: 0.74, toe pressure 92 (was 0.69) Bilateral ABI remain normal with bi and triphasic waveforms.    Bilateral LE Arterial Duplex (07-13-17): Right: Normal examination. No evidence of arterial occlusive    disease. Left: Normal examination. No evidence of arterial occlusive disease.    PLAN:  Based on the patient's  vascular studies and examination, pt will return to clinic in as needed. Walk at least 30 minutes total daily in a safe environment.   I discussed in depth with the patient the nature of atherosclerosis, and emphasized the importance of maximal medical management including strict control of blood pressure, blood glucose, and lipid levels, obtaining regular exercise, and continued cessation of smoking.  The patient is aware that without maximal medical management the underlying atherosclerotic disease process will progress, limiting the benefit of any interventions.  The patient was given information about PAD including signs, symptoms, treatment, what symptoms should prompt the patient to seek immediate medical care, and risk reduction measures to take.  Clemon Chambers, RN, MSN, FNP-C [facility] Phone: [phone]  Clinic MD: Oneida Alar  02/01/18 3:16 PM

## 2018-02-08 DIAGNOSIS — H1851 Endothelial corneal dystrophy: Secondary | ICD-10-CM | POA: Diagnosis not present

## 2018-02-08 DIAGNOSIS — H401131 Primary open-angle glaucoma, bilateral, mild stage: Secondary | ICD-10-CM | POA: Diagnosis not present

## 2018-02-08 DIAGNOSIS — H16223 Keratoconjunctivitis sicca, not specified as Sjogren's, bilateral: Secondary | ICD-10-CM | POA: Diagnosis not present

## 2018-02-08 DIAGNOSIS — H04123 Dry eye syndrome of bilateral lacrimal glands: Secondary | ICD-10-CM | POA: Diagnosis not present

## 2018-02-13 ENCOUNTER — Other Ambulatory Visit: Payer: Self-pay | Admitting: Family Medicine

## 2018-02-13 DIAGNOSIS — Z1231 Encounter for screening mammogram for malignant neoplasm of breast: Secondary | ICD-10-CM

## 2018-02-27 DIAGNOSIS — R739 Hyperglycemia, unspecified: Secondary | ICD-10-CM | POA: Diagnosis not present

## 2018-02-27 DIAGNOSIS — E7800 Pure hypercholesterolemia, unspecified: Secondary | ICD-10-CM | POA: Diagnosis not present

## 2018-02-27 DIAGNOSIS — I10 Essential (primary) hypertension: Secondary | ICD-10-CM | POA: Diagnosis not present

## 2018-03-01 DIAGNOSIS — E785 Hyperlipidemia, unspecified: Secondary | ICD-10-CM | POA: Diagnosis not present

## 2018-03-01 DIAGNOSIS — I10 Essential (primary) hypertension: Secondary | ICD-10-CM | POA: Diagnosis not present

## 2018-03-01 DIAGNOSIS — E119 Type 2 diabetes mellitus without complications: Secondary | ICD-10-CM | POA: Diagnosis not present

## 2018-03-01 DIAGNOSIS — Z0000 Encounter for general adult medical examination without abnormal findings: Secondary | ICD-10-CM | POA: Diagnosis not present

## 2018-03-07 ENCOUNTER — Encounter: Payer: Self-pay | Admitting: Physical Therapy

## 2018-03-07 ENCOUNTER — Other Ambulatory Visit: Payer: Self-pay

## 2018-03-07 ENCOUNTER — Ambulatory Visit: Payer: Medicare Other | Attending: Family Medicine | Admitting: Physical Therapy

## 2018-03-07 DIAGNOSIS — R2681 Unsteadiness on feet: Secondary | ICD-10-CM | POA: Insufficient documentation

## 2018-03-07 NOTE — Therapy (Signed)
[facility] [address] [place], [place], [address] Phone: [phone]   Fax:  [phone]  Physical Therapy Evaluation  Patient Details  Name: Gloria Murphy MRN: [id number] Date of Birth: 07/21/1935 No data recorded  Encounter Date: 03/07/2018  PT End of Session - 03/07/18 1108    Visit Number  1    Number of Visits  1    Authorization Type  [employer] Medicare    PT Start Time  0845    PT Stop Time  0905    PT Time Calculation (min)  20 min    Activity Tolerance  Patient tolerated treatment well    Behavior During Therapy  [facility] for tasks assessed/performed       Past Medical History:  Diagnosis Date  . Diabetes mellitus without complication ([place])   . High cholesterol   . Hypertension     Past Surgical History:  Procedure Laterality Date  . ABDOMINAL HYSTERECTOMY    . EYE SURGERY Bilateral 06/2016   Cataract  . LAPAROTOMY N/A 08/31/2017   Procedure: EXPLORATORY LAPAROTOMY; BILATERAL SALPINGOOPHERECTOMY; OMENTECTOMY, TUMOR DEBULKING;  Surgeon: Everitt Amber, MD;  Location: [facility];  Service: Gynecology;  Laterality: N/A;  . MASS EXCISION N/A 08/31/2017   Procedure: RESECTION OF PELVIC MASS;  Surgeon: Everitt Amber, MD;  Location: [facility];  Service: Gynecology;  Laterality: N/A;    There were no vitals filed for this visit.   Subjective Assessment - 03/07/18 0853    Subjective  Pt reports her doctor recommended she come to physical therapy although Pt denies the need for PT at this time. Reports surgery in April for removal of growth from stomach with subsequent removal of her ovaries. Reports arthritis to knees and shoulder region in which she participates in a walking program for 30 min per day with no issues. Reports no prior falls but does report unsteadiness. Reports when she bends over and returns to standing position she feels faint, which may be related to BP. Reports everything is going well at home with no issues  performing ADL's.      Pertinent History   HTN, HLD, DM Type II        Objective measurements completed on examination: See above findings.      PT Education - 03/07/18 1115    Education Details  Therapist provides education regarding benefits of participation in physical wellness program and aquatic therapy to reduce joint stiffness. Pt reports she will continue with her walking program and look into aquatic workouts on her own.     Person(s) Educated  Patient    Methods  Explanation    Comprehension  Verbalized understanding        Plan - 03/07/18 1109    Clinical Impression Statement  Pt is a 82 year old female referred to Neuro OPPT for evaluation of unsteady gait & falls. Pt's PMH is significant for the following: HTN, HLD, and Type II DM. During the initial examination, patient reports she has not had any falls and is not currently experiencing any limitations with functional mobility. Patient reports she routinely participates in her 30 min/ day walking program and is considering participating in pool workouts to alleviate joint pain. Therapist recommends patient may benefit from physical wellness program but does not believe physical therapy is indicated at this time. Patient verbalizes understanding and reports she will maintain active lifestyle.     History and Personal Factors relevant to plan of care:  HTN, HLD, and  Type II DM    Clinical Presentation  Stable    PT Frequency  One time visit    Consulted and Agree with Plan of Care  Patient       Problem List Patient Active Problem List   Diagnosis Date Noted  . Malignant carcinoid tumor of unknown primary site ([place]) 10/06/2017  . Pelvic mass 08/31/2017  . Elevated CA-125 08/31/2017  . Pelvic mass in female 08/31/2017    Floreen Comber, SPT 03/07/2018, 1:01 PM  [facility] [address] [place], [place], [address] Phone: [phone]   Fax:   [phone]  Name: Gloria Murphy MRN: [id number] Date of Birth: Apr 24, 1936

## 2018-03-21 ENCOUNTER — Ambulatory Visit
Admission: RE | Admit: 2018-03-21 | Discharge: 2018-03-21 | Disposition: A | Discharge status: Home / Self Care | Payer: Medicare Other | Source: Ambulatory Visit | Attending: Family Medicine | Admitting: Family Medicine

## 2018-03-21 DIAGNOSIS — Z1231 Encounter for screening mammogram for malignant neoplasm of breast: Secondary | ICD-10-CM

## 2018-07-03 DIAGNOSIS — E785 Hyperlipidemia, unspecified: Secondary | ICD-10-CM | POA: Diagnosis not present

## 2018-07-03 DIAGNOSIS — E1169 Type 2 diabetes mellitus with other specified complication: Secondary | ICD-10-CM | POA: Diagnosis not present

## 2018-07-03 DIAGNOSIS — J399 Disease of upper respiratory tract, unspecified: Secondary | ICD-10-CM | POA: Diagnosis not present

## 2018-07-03 DIAGNOSIS — I10 Essential (primary) hypertension: Secondary | ICD-10-CM | POA: Diagnosis not present

## 2018-07-10 DIAGNOSIS — H35033 Hypertensive retinopathy, bilateral: Secondary | ICD-10-CM | POA: Diagnosis not present

## 2018-07-10 DIAGNOSIS — H401131 Primary open-angle glaucoma, bilateral, mild stage: Secondary | ICD-10-CM | POA: Diagnosis not present

## 2018-07-10 DIAGNOSIS — H35363 Drusen (degenerative) of macula, bilateral: Secondary | ICD-10-CM | POA: Diagnosis not present

## 2018-07-10 DIAGNOSIS — E119 Type 2 diabetes mellitus without complications: Secondary | ICD-10-CM | POA: Diagnosis not present

## 2018-08-29 DIAGNOSIS — R739 Hyperglycemia, unspecified: Secondary | ICD-10-CM | POA: Diagnosis not present

## 2018-08-29 DIAGNOSIS — I10 Essential (primary) hypertension: Secondary | ICD-10-CM | POA: Diagnosis not present

## 2018-08-29 DIAGNOSIS — E119 Type 2 diabetes mellitus without complications: Secondary | ICD-10-CM | POA: Diagnosis not present

## 2018-08-29 DIAGNOSIS — J399 Disease of upper respiratory tract, unspecified: Secondary | ICD-10-CM | POA: Diagnosis not present

## 2018-09-03 DIAGNOSIS — I509 Heart failure, unspecified: Secondary | ICD-10-CM | POA: Diagnosis not present

## 2018-09-03 DIAGNOSIS — C7A022 Malignant carcinoid tumor of the ascending colon: Secondary | ICD-10-CM | POA: Diagnosis not present

## 2018-09-03 DIAGNOSIS — R739 Hyperglycemia, unspecified: Secondary | ICD-10-CM | POA: Diagnosis not present

## 2018-09-03 DIAGNOSIS — C7A098 Malignant carcinoid tumors of other sites: Secondary | ICD-10-CM | POA: Diagnosis not present

## 2018-09-03 DIAGNOSIS — I10 Essential (primary) hypertension: Secondary | ICD-10-CM | POA: Diagnosis not present

## 2018-09-03 DIAGNOSIS — E1169 Type 2 diabetes mellitus with other specified complication: Secondary | ICD-10-CM | POA: Diagnosis not present

## 2018-09-07 DIAGNOSIS — C7A098 Malignant carcinoid tumors of other sites: Secondary | ICD-10-CM | POA: Diagnosis not present

## 2018-09-07 DIAGNOSIS — I10 Essential (primary) hypertension: Secondary | ICD-10-CM | POA: Diagnosis not present

## 2018-09-07 DIAGNOSIS — R739 Hyperglycemia, unspecified: Secondary | ICD-10-CM | POA: Diagnosis not present

## 2018-09-07 DIAGNOSIS — R0602 Shortness of breath: Secondary | ICD-10-CM | POA: Diagnosis not present

## 2018-09-07 DIAGNOSIS — E1169 Type 2 diabetes mellitus with other specified complication: Secondary | ICD-10-CM | POA: Diagnosis not present

## 2018-09-14 DIAGNOSIS — E1169 Type 2 diabetes mellitus with other specified complication: Secondary | ICD-10-CM | POA: Diagnosis not present

## 2018-09-14 DIAGNOSIS — I10 Essential (primary) hypertension: Secondary | ICD-10-CM | POA: Diagnosis not present

## 2018-09-14 DIAGNOSIS — E785 Hyperlipidemia, unspecified: Secondary | ICD-10-CM | POA: Diagnosis not present

## 2018-09-14 DIAGNOSIS — M130 Polyarthritis, unspecified: Secondary | ICD-10-CM | POA: Diagnosis not present

## 2018-10-17 DIAGNOSIS — E785 Hyperlipidemia, unspecified: Secondary | ICD-10-CM | POA: Diagnosis not present

## 2018-10-17 DIAGNOSIS — I10 Essential (primary) hypertension: Secondary | ICD-10-CM | POA: Diagnosis not present

## 2018-10-17 DIAGNOSIS — E119 Type 2 diabetes mellitus without complications: Secondary | ICD-10-CM | POA: Diagnosis not present

## 2018-10-17 DIAGNOSIS — R5383 Other fatigue: Secondary | ICD-10-CM | POA: Diagnosis not present

## 2018-10-17 DIAGNOSIS — Z0000 Encounter for general adult medical examination without abnormal findings: Secondary | ICD-10-CM | POA: Diagnosis not present

## 2018-10-17 DIAGNOSIS — E1169 Type 2 diabetes mellitus with other specified complication: Secondary | ICD-10-CM | POA: Diagnosis not present

## 2018-10-17 DIAGNOSIS — I493 Ventricular premature depolarization: Secondary | ICD-10-CM | POA: Diagnosis not present

## 2018-10-23 DIAGNOSIS — E119 Type 2 diabetes mellitus without complications: Secondary | ICD-10-CM | POA: Diagnosis not present

## 2018-10-23 DIAGNOSIS — H47011 Ischemic optic neuropathy, right eye: Secondary | ICD-10-CM | POA: Diagnosis not present

## 2018-10-23 DIAGNOSIS — H401131 Primary open-angle glaucoma, bilateral, mild stage: Secondary | ICD-10-CM | POA: Diagnosis not present

## 2018-10-23 DIAGNOSIS — H35033 Hypertensive retinopathy, bilateral: Secondary | ICD-10-CM | POA: Diagnosis not present

## 2018-10-25 DIAGNOSIS — H47011 Ischemic optic neuropathy, right eye: Secondary | ICD-10-CM | POA: Diagnosis not present

## 2018-10-26 ENCOUNTER — Other Ambulatory Visit: Payer: Self-pay | Admitting: Cardiology

## 2018-10-26 ENCOUNTER — Encounter: Payer: Self-pay | Admitting: Cardiology

## 2018-10-26 ENCOUNTER — Ambulatory Visit (HOSPITAL_COMMUNITY): Payer: Medicare Other

## 2018-10-26 ENCOUNTER — Ambulatory Visit
Admission: RE | Admit: 2018-10-26 | Discharge: 2018-10-26 | Disposition: A | Discharge status: Home / Self Care | Payer: Medicare Other | Source: Ambulatory Visit | Attending: Cardiology | Admitting: Cardiology

## 2018-10-26 ENCOUNTER — Other Ambulatory Visit: Payer: Self-pay

## 2018-10-26 ENCOUNTER — Ambulatory Visit: Payer: Medicare Other | Admitting: Cardiology

## 2018-10-26 ENCOUNTER — Telehealth: Payer: Self-pay | Admitting: Cardiology

## 2018-10-26 VITALS — BP 126/67 | HR 64 | Ht 69.0 in | Wt 177.5 lb

## 2018-10-26 DIAGNOSIS — I10 Essential (primary) hypertension: Secondary | ICD-10-CM

## 2018-10-26 DIAGNOSIS — I493 Ventricular premature depolarization: Secondary | ICD-10-CM

## 2018-10-26 DIAGNOSIS — I6523 Occlusion and stenosis of bilateral carotid arteries: Secondary | ICD-10-CM | POA: Diagnosis not present

## 2018-10-26 DIAGNOSIS — E119 Type 2 diabetes mellitus without complications: Secondary | ICD-10-CM

## 2018-10-26 DIAGNOSIS — H532 Diplopia: Secondary | ICD-10-CM

## 2018-10-26 DIAGNOSIS — I209 Angina pectoris, unspecified: Secondary | ICD-10-CM

## 2018-10-26 DIAGNOSIS — E7800 Pure hypercholesterolemia, unspecified: Secondary | ICD-10-CM

## 2018-10-26 DIAGNOSIS — H539 Unspecified visual disturbance: Secondary | ICD-10-CM

## 2018-10-26 MED ORDER — ASPIRIN EC 81 MG PO TBEC: 81.0000 mg | DELAYED_RELEASE_TABLET | Freq: Every day | ORAL | 3 refills | Status: AC

## 2018-10-26 MED ORDER — IOPAMIDOL (ISOVUE-370) INJECTION 76%
75.0000 mL | Freq: Once | INTRAVENOUS | Status: AC | PRN
  Administered 2018-10-26: 75 mL via INTRAVENOUS

## 2018-10-26 MED ORDER — ROSUVASTATIN CALCIUM 40 MG PO TABS: 40.0000 mg | ORAL_TABLET | Freq: Every day | ORAL | 3 refills | Status: DC

## 2018-10-26 NOTE — Progress Notes (Signed)
Primary Physician:  Lucianne Lei, MD   Patient ID: Gloria Murphy, female    DOB: 11/14/1935, 83 y.o.   MRN: [id number]  Subjective:    Chief Complaint  Patient presents with  . New Patient (Initial Visit)    VENTRICULAR PREMATURE DEPOLARIZATION    HPI: Gloria Murphy  is a 83 y.o. female  with hypertension, hyperlipidemia, type 2 diabetes, history of stage 1 left ovarian carcinoid tumor, abdominal pelvic mass referred to [place] for evaluation of PVC's and chest pain.  Patient has had some episodes of exertional shortness of breath, chest discomfort, and fatigue. States episodes started approximately 1 month ago. States that some days are better than others, but on her bad days she does not feel well at all and mostly just wants to sleep. States that she is unable to even finish cooking dinner without resting. Symptoms will last for approximately 15 mins before resolving after resting. She has dizziness on these days with sudden position changes. No syncope. Denies any palpitations.   She does mention for the last one week, she has had significant vision changes with blurriness. States that her right eye is worse. She is now unable to read big letters even. She states that she was evaluated by opthalmology 1 day ago, who felt that she may have had a "stroke in her right eye" and she underwent some blood work, but is still waiting for results. She has not had any imaging. She denies any focal weakness/numbness, slurred speech, or difficulty speaking.   She has been evaluated by Vascular in June 2019 with normal ABI. Has chronic leg swelling and was felt to have some venous insufficiency.   She states that hypertension, hyperlipidemia, and diabetes are well controlled. She denies any history of tobacco, alcohol, or drug use. No family history of heart disease. She was noted to have complex solid pelvic mass by CT scan in March 2019, and in view of her history of ovarian cancer, gynecology was  obtained. Negative for carcinoid syndrome and recommended routine follow ups.   Past Medical History:  Diagnosis Date  . Diabetes mellitus without complication ([place])   . High cholesterol   . Hypertension     Past Surgical History:  Procedure Laterality Date  . ABDOMINAL HYSTERECTOMY    . EYE SURGERY Bilateral 06/2016   Cataract  . KNEE SURGERY Right   . LAPAROTOMY N/A 08/31/2017   Procedure: EXPLORATORY LAPAROTOMY; BILATERAL SALPINGOOPHERECTOMY; OMENTECTOMY, TUMOR DEBULKING;  Surgeon: Everitt Amber, MD;  Location: [facility];  Service: Gynecology;  Laterality: N/A;  . MASS EXCISION N/A 08/31/2017   Procedure: RESECTION OF PELVIC MASS;  Surgeon: Everitt Amber, MD;  Location: [facility];  Service: Gynecology;  Laterality: N/A;    Social History   Socioeconomic History  . Marital status: Married    Spouse name: Not on file  . Number of children: 3  . Years of education: Not on file  . Highest education level: Not on file  Occupational History  . Not on file  Social Needs  . Financial resource strain: Not on file  . Food insecurity    Worry: Not on file    Inability: Not on file  . Transportation needs    Medical: Not on file    Non-medical: Not on file  Tobacco Use  . Smoking status: Never Smoker  . Smokeless tobacco: Never Used  Substance and Sexual Activity  . Alcohol use: No  . Drug use: No  . Sexual activity:  Not on file  Lifestyle  . Physical activity    Days per week: Not on file    Minutes per session: Not on file  . Stress: Not on file  Relationships  . Social Herbalist on phone: Not on file    Gets together: Not on file    Attends religious service: Not on file    Active member of club or organization: Not on file    Attends meetings of clubs or organizations: Not on file    Relationship status: Not on file  . Intimate partner violence    Fear of current or ex partner: Not on file    Emotionally abused: Not on file    Physically abused: Not on file     Forced sexual activity: Not on file  Other Topics Concern  . Not on file  Social History Narrative  . Not on file    Review of Systems  Constitution: Positive for malaise/fatigue. Negative for decreased appetite, weight gain and weight loss.  Eyes: Positive for blurred vision (bilateral; worsening over the last few days). Negative for visual disturbance.  Cardiovascular: Positive for chest pain and dyspnea on exertion. Negative for claudication, leg swelling, orthopnea, palpitations and syncope.  Respiratory: Negative for hemoptysis and wheezing.   Endocrine: Negative for cold intolerance and heat intolerance.  Hematologic/Lymphatic: Does not bruise/bleed easily.  Skin: Negative for nail changes.  Musculoskeletal: Negative for muscle weakness and myalgias.  Gastrointestinal: Negative for abdominal pain, change in bowel habit, nausea and vomiting.  Neurological: Positive for dizziness (intermittent). Negative for difficulty with concentration, focal weakness and headaches.  Psychiatric/Behavioral: Negative for altered mental status and suicidal ideas.  All other systems reviewed and are negative.     Objective:  Blood pressure 126/67, pulse 64, height '5\' 9"'$  (1.753 m), weight 177 lb 8 oz (80.5 kg). Body mass index is 26.21 kg/m.    Physical Exam  Constitutional: She is oriented to person, place, and time. Vital signs are normal. She appears well-developed and well-nourished.  HENT:  Head: Normocephalic and atraumatic.  Neck: Normal range of motion.  Cardiovascular: Normal rate, regular rhythm, normal heart sounds and intact distal pulses. Frequent extrasystoles are present.  Pulmonary/Chest: Effort normal and breath sounds normal. No accessory muscle usage. No respiratory distress.  Abdominal: Soft. Bowel sounds are normal.  Musculoskeletal: Normal range of motion.  Neurological: She is alert and oriented to person, place, and time.  Skin: Skin is warm and dry.  Vitals reviewed.   Radiology: No results found.  Laboratory examination:   PCP labs 10/17/2018: Cholesterol 211, triglycerides 146, HDL 47, LDL 136. Creatinine 0.91, eGFR 69, potassium 4.2, CMP normal. Hgb 11.2, Hct 34, CBC otherwise normal. TSH low at 0.03, T3 and T 4 normal. HgbA1c 5.4%.   CMP Latest Ref Rng & Units 10/25/2017 10/06/2017 09/05/2017  Glucose 65 - 99 mg/dL 106(H) 104 86  BUN 6 - 20 mg/dL '13 15 9  '$ Creatinine 0.44 - 1.00 mg/dL 0.90 1.03 0.83  Sodium 135 - 145 mmol/L 140 138 137  Potassium 3.5 - 5.1 mmol/L 3.8 4.2 2.9(L)  Chloride 101 - 111 mmol/L 100(L) 102 98(L)  CO2 22 - 29 mmol/L - 26 27  Calcium 8.4 - 10.4 mg/dL - 9.9 9.1  Total Protein 6.4 - 8.3 g/dL - 8.4(H) -  Total Bilirubin 0.2 - 1.2 mg/dL - 0.4 -  Alkaline Phos 40 - 150 U/L - 61 -  AST 5 - 34 U/L - 21 -  ALT 0 - 55 U/L - 10 -   CBC Latest Ref Rng & Units 10/25/2017 10/25/2017 10/06/2017  WBC 4.0 - 10.5 K/uL - 3.9(L) 4.2  Hemoglobin 12.0 - 15.0 g/dL 11.6(L) 11.3(L) 11.5(L)  Hematocrit 36.0 - 46.0 % 34.0(L) 35.8(L) 35.3  Platelets 150 - 400 K/uL - 203 194   Lipid Panel     Component Value Date/Time   CHOL  10/31/2007 1600    112        ATP III CLASSIFICATION:  <200     mg/dL   Desirable  200-239  mg/dL   Borderline High  >=240    mg/dL   High   TRIG 51 10/31/2007 1600   HDL 42 10/31/2007 1600   CHOLHDL 2.7 10/31/2007 1600   VLDL 10 10/31/2007 1600   LDLCALC  10/31/2007 1600    60        Total Cholesterol/HDL:CHD Risk Coronary Heart Disease Risk Table                     Men   Women  1/2 Average Risk   3.4   3.3   HEMOGLOBIN A1C Lab Results  Component Value Date   HGBA1C 5.4 08/28/2017   MPG 108.28 08/28/2017   TSH No results for input(s): TSH in the last 8760 hours.  PRN Meds:. Medications Discontinued During This Encounter  Medication Reason  . azithromycin (ZITHROMAX) 250 MG tablet Error  . cycloSPORINE (RESTASIS) 0.05 % ophthalmic emulsion Error  . pioglitazone (ACTOS) 15 MG tablet Error   Current  Meds  Medication Sig  . Calcium Carbonate (CALTRATE 600 PO) Take 1 tablet by mouth daily.  . carboxymethylcellulose (REFRESH TEARS) 0.5 % SOLN Place 1 drop into both eyes 3 (three) times daily.  . Cetirizine HCl 10 MG CAPS Take 1 capsule (10 mg total) by mouth daily for 10 days.  . ferrous sulfate 325 (65 FE) MG tablet Take 325 mg by mouth daily with breakfast.  . glimepiride (AMARYL) 1 MG tablet TK 1 T PO D FOR DIABETES  . Olmesartan-amLODIPine-HCTZ 40-5-12.5 MG TABS Take 1 tablet orally daily  . omeprazole (PRILOSEC) 20 MG capsule Take 1 capsule (20 mg total) by mouth daily.  . Potassium 99 MG TABS Take 1 tablet by mouth daily.  . rosuvastatin (CRESTOR) 20 MG tablet Take 20 mg by mouth daily.    Cardiac Studies:     Assessment:     ICD-10-CM   1. PVC (premature ventricular contraction)  I49.3 EKG 12-Lead    PCV MYOCARDIAL PERFUSION WITH LEXISCAN    ECHOCARDIOGRAM COMPLETE    CANCELED: PCV ECHOCARDIOGRAM COMPLETE  2. Angina pectoris ([place])  I20.9 PCV MYOCARDIAL PERFUSION WITH LEXISCAN  3. Vision changes  H53.9   4. Diplopia  H53.2 CT ANGIO HEAD W OR WO CONTRAST    CANCELED: CT HEAD W & WO CONTRAST    CANCELED: CT ANGIO NECK W OR WO CONTRAST  5. Essential hypertension  I10   6. Hypercholesteremia  E78.00   7. Type 2 diabetes mellitus without complication, without long-term current use of insulin (HCC)  E11.9      EKG 10/26/2018: Normal sinus rhythm at 76 bpm with 2 PVC's, borderline left atrial enlargement, no evidence of ischemia.  Recommendations:   Patient with multiple risk factors for CAD, with 1 month symptoms concerning for angina.  She does have occasional/frequent PVCs on EKG but appears to be asymptomatic in regards to this.  She will need ischemic work-up.  We will schedule for Lexiscan nuclear stress testing as well as echocardiogram to exclude any structural abnormalities.  She has chronic leg edema thought to be related to venous insufficiency in the past.  I have  encouraged her to start daily 81 mg aspirin until cardiac work-up is complete. I will increase her Crestor to 40 mg daily as recent lipids are not well controlled.  Of note, her TSH was noted to be low with normal T3 and T4.  No history of thyroid disorders. She will continue work-up with PCP as well.  I have advised her to take it easy until cardiac work-up is complete.  I am concerned about her 1 week onset of bilateral visual changes. She does not have any other evidence of CVA such as unilateral weakness, facial drooping, etc. As her symptoms have been present for 1 week, do not feel she needs ER visit. Will obtain CT of the head along with CTA head and neck today to exclude any acute CVA or critical carotid stenosis.  She will also continue to work-up with her ophthalmologist.  Depending upon CT scan results, may need neurology referral.  Blood pressure is well controlled.  Diabetes appears to be well controlled by last hemoglobin A1c. I will see her back in the next few weeks for close follow up.  *Addendum: CT and CTA are negative for acute CVA. No findings to explain her symptoms. Will continue workup with her opthamologist. Will hold off on neurology referral for now.*   *I have discussed this case with Dr. Virgina Jock and he personally examined the patient and participated in formulating the plan.*    Miquel Dunn, MSN, APRN, FNP-C [facility]. [place] Office: [phone] Fax: [phone]

## 2018-10-26 NOTE — Telephone Encounter (Signed)
LVM informing patient that no CVA was seen on recent CT scan. Will call again on Monday to further discuss findings

## 2018-10-29 ENCOUNTER — Ambulatory Visit (HOSPITAL_COMMUNITY): Admission: RE | Admit: 2018-10-29 | Payer: Medicare Other | Source: Ambulatory Visit

## 2018-10-29 ENCOUNTER — Encounter: Payer: Self-pay | Admitting: Cardiology

## 2018-11-06 ENCOUNTER — Other Ambulatory Visit: Payer: Self-pay

## 2018-11-06 NOTE — Patient Outreach (Signed)
[facility]) Care Management  11/06/2018  HARLA MENSCH [birth year] [id number]   Medication Adherence call to Mrs. Bennye Alm [facility] spoke with patient she is due on Olmesartan/Amlodipine/Hctz 40/5/12.5 mg patient explain she is taking 1 tablet daily and is due, patient ask if we can call [facility] an order this medication [facility] for patient to pick up. Mrs. Savitt is showing past due under [place].  [place] [facility] Direct Dial [phone]  Fax [phone] [email or web address]

## 2018-11-07 ENCOUNTER — Other Ambulatory Visit: Payer: Self-pay

## 2018-11-07 ENCOUNTER — Ambulatory Visit (INDEPENDENT_AMBULATORY_CARE_PROVIDER_SITE_OTHER): Payer: Medicare Other

## 2018-11-07 DIAGNOSIS — I209 Angina pectoris, unspecified: Secondary | ICD-10-CM

## 2018-11-07 DIAGNOSIS — I493 Ventricular premature depolarization: Secondary | ICD-10-CM | POA: Diagnosis not present

## 2018-11-16 ENCOUNTER — Encounter: Payer: Self-pay | Admitting: Cardiology

## 2018-11-16 ENCOUNTER — Ambulatory Visit: Payer: Medicare Other | Admitting: Cardiology

## 2018-11-16 ENCOUNTER — Other Ambulatory Visit: Payer: Self-pay | Admitting: Cardiology

## 2018-11-16 ENCOUNTER — Other Ambulatory Visit: Payer: Self-pay

## 2018-11-16 VITALS — BP 147/79 | HR 79 | Ht 69.0 in | Wt 180.1 lb

## 2018-11-16 DIAGNOSIS — I493 Ventricular premature depolarization: Secondary | ICD-10-CM

## 2018-11-16 DIAGNOSIS — H538 Other visual disturbances: Secondary | ICD-10-CM | POA: Diagnosis not present

## 2018-11-16 DIAGNOSIS — I10 Essential (primary) hypertension: Secondary | ICD-10-CM | POA: Diagnosis not present

## 2018-11-16 DIAGNOSIS — R0609 Other forms of dyspnea: Secondary | ICD-10-CM | POA: Diagnosis not present

## 2018-11-16 DIAGNOSIS — I83893 Varicose veins of bilateral lower extremities with other complications: Secondary | ICD-10-CM | POA: Diagnosis not present

## 2018-11-16 MED ORDER — OLMESARTAN MEDOXOMIL 40 MG PO TABS: 40.0000 mg | ORAL_TABLET | Freq: Every day | ORAL | 1 refills | Status: DC

## 2018-11-16 NOTE — Progress Notes (Signed)
Primary Physician:  Lucianne Lei, MD   Patient ID: Gloria Murphy, female    DOB: 1935-05-18, 83 y.o.   MRN: [id number]  Subjective:    Chief Complaint  Patient presents with  . Hypertension  . Hyperlipidemia  . Follow-up    HPI: Gloria Murphy  is a 83 y.o. female  with hypertension, hyperlipidemia, type 2 diabetes, history of stage 1 left ovarian carcinoid tumor, abdominal pelvic mass recently evaluated by [place] for symptoms of angina and frequent PVC's. Due to concerning symptoms of angina, she underwent stress testing and also due to her vision changes and concern for CVA underwent CTA head and neck. She now presents for follow up.  Echocardiogram is pending. She is feeling some better. Dizziness has resolved with stopping Tribenzor. She has not had any chest pain. She still has some shortness of breath on exertion and fatigue. No history of syncope. She has is asymptomatic in regards to PVC's.   She continues to have blurriness in her right eye, but denies any symptoms in her left eye. Vision has not worsened since last seen by [place]. No weakness, slurred speech, or numbness. She is being followed by opthalmology, and she states that they are evaluating her for potential "stroke in her right eye".  She has chronic leg edema that she states is stable. Felt to be related to venous insufficiency. She has been evaluated by Vascular in June 2019 with normal ABI.   She states that hypertension, hyperlipidemia, and diabetes are well controlled. She denies any history of tobacco, alcohol, or drug use. No family history of heart disease. She was noted to have complex solid pelvic mass by CT scan in March 2019, and in view of her history of ovarian cancer, gynecology was obtained. Negative for carcinoid syndrome and recommended routine follow ups.   Past Medical History:  Diagnosis Date  . Diabetes mellitus without complication ([place])   . High cholesterol   . Hypertension     Past Surgical  History:  Procedure Laterality Date  . ABDOMINAL HYSTERECTOMY    . EYE SURGERY Bilateral 06/2016   Cataract  . KNEE SURGERY Right   . LAPAROTOMY N/A 08/31/2017   Procedure: EXPLORATORY LAPAROTOMY; BILATERAL SALPINGOOPHERECTOMY; OMENTECTOMY, TUMOR DEBULKING;  Surgeon: Everitt Amber, MD;  Location: [facility];  Service: Gynecology;  Laterality: N/A;  . MASS EXCISION N/A 08/31/2017   Procedure: RESECTION OF PELVIC MASS;  Surgeon: Everitt Amber, MD;  Location: [facility];  Service: Gynecology;  Laterality: N/A;    Social History   Socioeconomic History  . Marital status: Married    Spouse name: Not on file  . Number of children: 3  . Years of education: Not on file  . Highest education level: Not on file  Occupational History  . Not on file  Social Needs  . Financial resource strain: Not on file  . Food insecurity    Worry: Not on file    Inability: Not on file  . Transportation needs    Medical: Not on file    Non-medical: Not on file  Tobacco Use  . Smoking status: Never Smoker  . Smokeless tobacco: Never Used  Substance and Sexual Activity  . Alcohol use: No  . Drug use: No  . Sexual activity: Not on file  Lifestyle  . Physical activity    Days per week: Not on file    Minutes per session: Not on file  . Stress: Not on file  Relationships  . Social  connections    Talks on phone: Not on file    Gets together: Not on file    Attends religious service: Not on file    Active member of club or organization: Not on file    Attends meetings of clubs or organizations: Not on file    Relationship status: Not on file  . Intimate partner violence    Fear of current or ex partner: Not on file    Emotionally abused: Not on file    Physically abused: Not on file    Forced sexual activity: Not on file  Other Topics Concern  . Not on file  Social History Narrative  . Not on file    Review of Systems  Constitution: Positive for malaise/fatigue. Negative for decreased appetite, weight  gain and weight loss.  Eyes: Positive for blurred vision (previously bilaterl; now only in right eye). Negative for visual disturbance.  Cardiovascular: Positive for dyspnea on exertion and leg swelling. Negative for chest pain, claudication, orthopnea, palpitations and syncope.  Respiratory: Negative for hemoptysis and wheezing.   Endocrine: Negative for cold intolerance and heat intolerance.  Hematologic/Lymphatic: Does not bruise/bleed easily.  Skin: Negative for nail changes.  Musculoskeletal: Negative for muscle weakness and myalgias.  Gastrointestinal: Negative for abdominal pain, change in bowel habit, nausea and vomiting.  Neurological: Negative for difficulty with concentration, dizziness, focal weakness and headaches.  Psychiatric/Behavioral: Negative for altered mental status and suicidal ideas.  All other systems reviewed and are negative.     Objective:  Blood pressure (!) 147/79, pulse 79, height '5\' 9"'$  (1.753 m), weight 180 lb 1.6 oz (81.7 kg), SpO2 97 %. Body mass index is 26.6 kg/m.    Physical Exam  Constitutional: She is oriented to person, place, and time. Vital signs are normal. She appears well-developed and well-nourished.  HENT:  Head: Normocephalic and atraumatic.  Neck: Normal range of motion.  Cardiovascular: Normal rate, regular rhythm, normal heart sounds and intact distal pulses. Frequent extrasystoles are present.  Pulmonary/Chest: Effort normal and breath sounds normal. No accessory muscle usage. No respiratory distress.  Abdominal: Soft. Bowel sounds are normal.  Musculoskeletal: Normal range of motion.  Neurological: She is alert and oriented to person, place, and time.  Skin: Skin is warm and dry.  Vitals reviewed.  Radiology: CTA neck 10/26/2018:  Normal appearance of the brain for age.   Minimal atherosclerosis of the aortic arch and at both carotid bifurcations. No stenosis.   Both cervical internal carotid arteries are small vessels but do  not show a focal stenosis or irregularity. The vertebral arteries are actually larger than the internal carotid arteries.   Atherosclerosis in the carotid siphon regions with stenosis estimated at 50-70% on each side. No intracranial branch vessel stenosis or occlusion.  CTA head 10/26/2018:  Normal appearance of the brain for age.   Minimal atherosclerosis of the aortic arch and at both carotid bifurcations. No stenosis.   Both cervical internal carotid arteries are small vessels but do not show a focal stenosis or irregularity. The vertebral arteries are actually larger than the internal carotid arteries.   Atherosclerosis in the carotid siphon regions with stenosis estimated at 50-70% on each side. No intracranial branch vessel stenosis or occlusion.   Laboratory examination:   PCP labs 10/17/2018: Cholesterol 211, triglycerides 146, HDL 47, LDL 136. Creatinine 0.91, eGFR 69, potassium 4.2, CMP normal. Hgb 11.2, Hct 34, CBC otherwise normal. TSH low at 0.03, T3 and T 4 normal. HgbA1c 5.4%.  CMP Latest Ref Rng & Units 10/25/2017 10/06/2017 09/05/2017  Glucose 65 - 99 mg/dL 106(H) 104 86  BUN 6 - 20 mg/dL '13 15 9  '$ Creatinine 0.44 - 1.00 mg/dL 0.90 1.03 0.83  Sodium 135 - 145 mmol/L 140 138 137  Potassium 3.5 - 5.1 mmol/L 3.8 4.2 2.9(L)  Chloride 101 - 111 mmol/L 100(L) 102 98(L)  CO2 22 - 29 mmol/L - 26 27  Calcium 8.4 - 10.4 mg/dL - 9.9 9.1  Total Protein 6.4 - 8.3 g/dL - 8.4(H) -  Total Bilirubin 0.2 - 1.2 mg/dL - 0.4 -  Alkaline Phos 40 - 150 U/L - 61 -  AST 5 - 34 U/L - 21 -  ALT 0 - 55 U/L - 10 -   CBC Latest Ref Rng & Units 10/25/2017 10/25/2017 10/06/2017  WBC 4.0 - 10.5 K/uL - 3.9(L) 4.2  Hemoglobin 12.0 - 15.0 g/dL 11.6(L) 11.3(L) 11.5(L)  Hematocrit 36.0 - 46.0 % 34.0(L) 35.8(L) 35.3  Platelets 150 - 400 K/uL - 203 194   Lipid Panel     Component Value Date/Time   CHOL  10/31/2007 1600    112        ATP III CLASSIFICATION:  <200     mg/dL   Desirable   200-239  mg/dL   Borderline High  >=240    mg/dL   High   TRIG 51 10/31/2007 1600   HDL 42 10/31/2007 1600   CHOLHDL 2.7 10/31/2007 1600   VLDL 10 10/31/2007 1600   LDLCALC  10/31/2007 1600    60        Total Cholesterol/HDL:CHD Risk Coronary Heart Disease Risk Table                     Men   Women  1/2 Average Risk   3.4   3.3   HEMOGLOBIN A1C Lab Results  Component Value Date   HGBA1C 5.4 08/28/2017   MPG 108.28 08/28/2017   TSH No results for input(s): TSH in the last 8760 hours.  PRN Meds:. Medications Discontinued During This Encounter  Medication Reason  . Cetirizine HCl 10 MG CAPS Error   Current Meds  Medication Sig  . aspirin EC 81 MG tablet Take 1 tablet (81 mg total) by mouth daily.  . Calcium Carbonate (CALTRATE 600 PO) Take 1 tablet by mouth daily.  . carboxymethylcellulose (REFRESH TEARS) 0.5 % SOLN Place 1 drop into both eyes 3 (three) times daily.  . ferrous sulfate 325 (65 FE) MG tablet Take 325 mg by mouth daily with breakfast.  . glimepiride (AMARYL) 1 MG tablet TK 1 T PO D FOR DIABETES  . omeprazole (PRILOSEC) 20 MG capsule Take 1 capsule (20 mg total) by mouth daily.  . Potassium 99 MG TABS Take 1 tablet by mouth daily.  . rosuvastatin (CRESTOR) 40 MG tablet Take 1 tablet (40 mg total) by mouth daily.  . [DISCONTINUED] Cetirizine HCl 10 MG CAPS Take 1 capsule (10 mg total) by mouth daily for 10 days.    Cardiac Studies:   Lexiscan Myoview Stress Test 11/07/2018: Stress EKG is non-diagnostic, as this is pharmacological stress test. Occasional PVC noted through out the stress test.  Myocardial pefusion imaging is normal. Left ventricular ejection fraction is  72% with normal wall motion.  Low risk study.  Assessment:     ICD-10-CM   1. PVC (premature ventricular contraction)  I49.3 PCV ECHOCARDIOGRAM COMPLETE  2. Essential hypertension  I10   3. Varicose veins  of leg with edema, bilateral  I83.893   4. Dyspnea on exertion  R06.09   5. Blurred  vision, right eye  H53.8      EKG 10/26/2018: Normal sinus rhythm at 76 bpm with 2 PVC's, borderline left atrial enlargement, no evidence of ischemia.  Recommendations:   Last seen by me, she has not had any episodes of chest pain or discomfort and fatigue is also improved.  I discussed recently obtain Lexiscan nuclear stress test, no perfusion abnormalities were noted. Low risk study.  She does continue to have dyspnea on exertion that I feel is likely multifactorial.  She continues to have occasional PVCs but is asymptomatic from this.  We'll continue with echocardiogram for further evaluation of any structural abnormalities.  Has chronic leg edema related to venous insufficiency, encouraged her to wear support stockings daily.    Previously noted dizziness with position changes has resolved with stopping her Tribenzor.  Suspect may have been related to orthostatic hypotension secondary to diabetes. Amlodipine and hydrochlorothiazide may have also been contributing. She is not noted to be orthostatic today.  In view of her diabetes and elevated blood pressure today, I will restart her on olmesartan 40 mg daily.  She may need beta blocker therapy for PVCs, but we'll hold off depending upon echocardiogram results and also how she tolerates restarting olmesartan.   Continues to have blurriness in her right eye and is being followed by ophthalmology.  CTA of the head and neck was negative for acute CVA. She has 50-70% bilateral stenosis in carotid siphon region, but do not feel that this is likely etiology of symptoms.. No evidence of posterior CVA. CTA head and neck results were also reviewed with Dr. Virgina Jock, who agrees likely not etiology.  She's not had any other symptoms of CVA. I feel that she should be further evaluated by neurology for further evaluation of her symptoms.  She will also need to follow-up with her PCP regarding her TSH level.  I'll see her back in 6 weeks to follow-up on her PVCs,  hypertension, and discussed echocardiogram results.    Miquel Dunn, MSN, APRN, FNP-C [facility]. [place] Office: [phone] Fax: [phone]

## 2018-11-21 DIAGNOSIS — I10 Essential (primary) hypertension: Secondary | ICD-10-CM | POA: Diagnosis not present

## 2018-11-21 DIAGNOSIS — E785 Hyperlipidemia, unspecified: Secondary | ICD-10-CM | POA: Diagnosis not present

## 2018-11-21 DIAGNOSIS — H547 Unspecified visual loss: Secondary | ICD-10-CM | POA: Diagnosis not present

## 2018-11-30 ENCOUNTER — Other Ambulatory Visit: Payer: Self-pay | Admitting: Family Medicine

## 2018-11-30 ENCOUNTER — Other Ambulatory Visit (HOSPITAL_COMMUNITY): Payer: Self-pay | Admitting: Family Medicine

## 2018-11-30 DIAGNOSIS — E039 Hypothyroidism, unspecified: Secondary | ICD-10-CM | POA: Diagnosis not present

## 2018-11-30 DIAGNOSIS — I509 Heart failure, unspecified: Secondary | ICD-10-CM | POA: Diagnosis not present

## 2018-11-30 DIAGNOSIS — R634 Abnormal weight loss: Secondary | ICD-10-CM

## 2018-11-30 DIAGNOSIS — E031 Congenital hypothyroidism without goiter: Secondary | ICD-10-CM | POA: Diagnosis not present

## 2018-11-30 DIAGNOSIS — I10 Essential (primary) hypertension: Secondary | ICD-10-CM | POA: Diagnosis not present

## 2018-12-06 ENCOUNTER — Ambulatory Visit (HOSPITAL_COMMUNITY)
Admission: RE | Admit: 2018-12-06 | Discharge: 2018-12-06 | Disposition: A | Discharge status: Home / Self Care | Payer: Medicare Other | Source: Ambulatory Visit | Attending: Family Medicine | Admitting: Family Medicine

## 2018-12-06 ENCOUNTER — Other Ambulatory Visit: Payer: Self-pay

## 2018-12-06 DIAGNOSIS — R634 Abnormal weight loss: Secondary | ICD-10-CM | POA: Insufficient documentation

## 2018-12-18 ENCOUNTER — Ambulatory Visit (INDEPENDENT_AMBULATORY_CARE_PROVIDER_SITE_OTHER): Payer: Medicare Other

## 2018-12-18 ENCOUNTER — Other Ambulatory Visit: Payer: Self-pay

## 2018-12-18 DIAGNOSIS — I10 Essential (primary) hypertension: Secondary | ICD-10-CM | POA: Diagnosis not present

## 2018-12-18 DIAGNOSIS — R0609 Other forms of dyspnea: Secondary | ICD-10-CM | POA: Diagnosis not present

## 2018-12-18 DIAGNOSIS — I493 Ventricular premature depolarization: Secondary | ICD-10-CM | POA: Diagnosis not present

## 2018-12-19 ENCOUNTER — Ambulatory Visit (INDEPENDENT_AMBULATORY_CARE_PROVIDER_SITE_OTHER): Payer: Medicare Other | Admitting: Neurology

## 2018-12-19 ENCOUNTER — Encounter: Payer: Self-pay | Admitting: Neurology

## 2018-12-19 VITALS — BP 168/71 | HR 74 | Temp 98.3°F | Ht 69.0 in | Wt 177.8 lb

## 2018-12-19 DIAGNOSIS — H539 Unspecified visual disturbance: Secondary | ICD-10-CM

## 2018-12-19 DIAGNOSIS — G459 Transient cerebral ischemic attack, unspecified: Secondary | ICD-10-CM | POA: Diagnosis not present

## 2018-12-19 NOTE — Progress Notes (Signed)
PATIENT: Gloria Murphy DOB: Dec 14, 1935  Chief Complaint  Patient presents with  . Loss of Vision    Reports having an episode of seeing blurred vision and seeing vivid colors, lasting a few minutes.  She has continued having intermittent blurred vision.  She will occasionally see a solid, black line down the middle of her right eye.   [name] PCP    Lucianne Lei, MD     HISTORICAL  Gloria Murphy is a 83 years old female, seen in request by her primary care physician Dr. Criss Rosales, Myra Rude, for evaluation of sudden visual change, initial evaluation was on December 19, 2018.  I have reviewed and summarized the referring note from the referring physician.  I also reviewed ophthalmologist Dr. Payton Emerald evaluation on October 23, 2018.  She had a past medical history of hypertension, hyperlipidemia, diabetes, she is a poor historian, per record, she was seen for acute decreased vision in both eye since early June 2020, best corrected visual acuity right eye 20/160, 20/16 left eye, this was a reduction from 20/20 in both eyes in March 2020, her extraocular muscle testing was normal, confrontational visual field testing appeared should before, funduscopy examination after dilation, right optic nerve appeared to be swollen with a possible infarction on the superior aspect of the nerve, left optic nerve, well perfused, healthy, distinct without evidence of edema.  There was also mild to moderate hypertensive changes to her retinal vasculature, and all other retinal findings were normal, visual field testing revealed inferior defect consistent with the nerve appearance in both eye.  Patient reported 2 months ago in early June 2020, she woke up 1 night using bathroom, noticed sudden onset bilateral blurry vision, color distortion, she can still see the object, but was overlaid with rapid change color shapes, she was able to finish using bathroom, went back to sleep again, next day, when she woke up, she noticed the  blurry vision, she described right nasal inferior field deficit, which has been persistent since then, she was put on aspirin 81 mg daily,  Laboratory evaluations in June 2020, LDL was 136, normal CMP, creatinine of 0.94, CBC hemoglobin of 11.2, normal thyroid functional test, A1c was 5.4, TSH was decreased 0.03,   REVIEW OF SYSTEMS: Full 14 system review of systems performed and notable only for as above All other review of systems were negative.  ALLERGIES: No Known Allergies  HOME MEDICATIONS: Current Outpatient Medications  Medication Sig Dispense Refill  . aspirin EC 81 MG tablet Take 1 tablet (81 mg total) by mouth daily. 90 tablet 3  . Calcium Carbonate (CALTRATE 600 PO) Take 1 tablet by mouth daily.    . carboxymethylcellulose (REFRESH TEARS) 0.5 % SOLN Place 1 drop into both eyes 3 (three) times daily.    . ferrous sulfate 325 (65 FE) MG tablet Take 325 mg by mouth daily with breakfast.    . glimepiride (AMARYL) 1 MG tablet TK 1 T PO D FOR DIABETES  0  . olmesartan (BENICAR) 40 MG tablet TAKE 1 TABLET(40 MG) BY MOUTH DAILY 90 tablet 3  . Olmesartan-amLODIPine-HCTZ 40-5-12.5 MG TABS Take 1 tablet orally daily  0  . omeprazole (PRILOSEC) 20 MG capsule Take 1 capsule (20 mg total) by mouth daily. 30 capsule 0  . Potassium 99 MG TABS Take 1 tablet by mouth daily.    . rosuvastatin (CRESTOR) 40 MG tablet Take 1 tablet (40 mg total) by mouth daily. 90 tablet 3   No current  facility-administered medications for this visit.     PAST MEDICAL HISTORY: Past Medical History:  Diagnosis Date  . Diabetes mellitus without complication ([place])   . High cholesterol   . Hypertension     PAST SURGICAL HISTORY: Past Surgical History:  Procedure Laterality Date  . ABDOMINAL HYSTERECTOMY    . EYE SURGERY Bilateral 06/2016   Cataract  . KNEE SURGERY Right   . LAPAROTOMY N/A 08/31/2017   Procedure: EXPLORATORY LAPAROTOMY; BILATERAL SALPINGOOPHERECTOMY; OMENTECTOMY, TUMOR DEBULKING;   Surgeon: Everitt Amber, MD;  Location: [facility];  Service: Gynecology;  Laterality: N/A;  . MASS EXCISION N/A 08/31/2017   Procedure: RESECTION OF PELVIC MASS;  Surgeon: Everitt Amber, MD;  Location: [facility];  Service: Gynecology;  Laterality: N/A;    FAMILY HISTORY: Family History  Problem Relation Age of Onset  . Heart disease Mother   . Diabetes Mother   . Kidney failure Father   . Breast cancer Neg Hx     SOCIAL HISTORY: Social History   Socioeconomic History  . Marital status: Married    Spouse name: Not on file  . Number of children: 3  . Years of education: 6  . Highest education level: High school graduate  Occupational History  . Occupation: Retired  Scientific laboratory technician  . Financial resource strain: Not on file  . Food insecurity    Worry: Not on file    Inability: Not on file  . Transportation needs    Medical: Not on file    Non-medical: Not on file  Tobacco Use  . Smoking status: Never Smoker  . Smokeless tobacco: Never Used  Substance and Sexual Activity  . Alcohol use: No  . Drug use: No  . Sexual activity: Not on file  Lifestyle  . Physical activity    Days per week: Not on file    Minutes per session: Not on file  . Stress: Not on file  Relationships  . Social Herbalist on phone: Not on file    Gets together: Not on file    Attends religious service: Not on file    Active member of club or organization: Not on file    Attends meetings of clubs or organizations: Not on file    Relationship status: Not on file  . Intimate partner violence    Fear of current or ex partner: Not on file    Emotionally abused: Not on file    Physically abused: Not on file    Forced sexual activity: Not on file  Other Topics Concern  . Not on file  Social History Narrative   Lives at home with her husband.   Right-handed.   Occasional caffeine.     PHYSICAL EXAM   Vitals:   12/19/18 1435  BP: (!) 168/71  Pulse: 74  Temp: 98.3 F (36.8 C)  Weight: 177 lb  12 oz (80.6 kg)  Height: 5\' 9"  (1.753 m)    Not recorded      Body mass index is 26.25 kg/m.  PHYSICAL EXAMNIATION:  Gen: NAD, conversant, well nourised, obese, well groomed                     Cardiovascular: Regular rate rhythm, no peripheral edema, warm, nontender. Eyes: Conjunctivae clear without exudates or hemorrhage Neck: Supple, no carotid bruits. Pulmonary: Clear to auscultation bilaterally   NEUROLOGICAL EXAM:  MENTAL STATUS: Speech:    Speech is normal; fluent and spontaneous with normal comprehension.  Cognition:     Orientation to time, place and person     Normal recent and remote memory     Normal Attention span and concentration     Normal Language, naming, repeating,spontaneous speech     Fund of knowledge   CRANIAL NERVES: CN II: Visual fields are full to confrontation. Pupils are round equal and briskly reactive to light. CN III, IV, VI: extraocular movement are normal. No ptosis. CN V: Facial sensation is intact to pinprick in all 3 divisions bilaterally. Corneal responses are intact.  CN VII: Face is symmetric with normal eye closure and smile. CN VIII: Hearing is normal to rubbing fingers CN IX, X: Palate elevates symmetrically. Phonation is normal. CN XI: Head turning and shoulder shrug are intact CN XII: Tongue is midline with normal movements and no atrophy.  MOTOR: There is no pronator drift of out-stretched arms. Muscle bulk and tone are normal. Muscle strength is normal.  REFLEXES: Reflexes are 2+ and symmetric at the biceps, triceps, knees, and ankles. Plantar responses are flexor.  SENSORY: Intact to light touch, pinprick, positional sensation and vibratory sensation are intact in fingers and toes.  COORDINATION: Rapid alternating movements and fine finger movements are intact. There is no dysmetria on finger-to-nose and heel-knee-shin.    GAIT/STANCE 1/2-5.  Make it flat at the end of the working day turnaround and can lose diet for 1  hour you can be in the morning ) Admit her for drooling for Botox Posture is normal. Gait is steady with normal steps, base, arm swing, and turning. Heel and toe walking are normal. Tandem gait is normal.  Romberg is absent.   DIAGNOSTIC DATA (LABS, IMAGING, TESTING) - I reviewed patient records, labs, notes, testing and imaging myself where available.   ASSESSMENT AND PLAN  MAILY DEBARGE is a 83 y.o. 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 Ph.D.  [facility] [address], [place], [place] [address] Ph: [phone] Fax: [phone]  CC: Lucianne Lei, MD

## 2018-12-20 ENCOUNTER — Telehealth: Payer: Self-pay | Admitting: Neurology

## 2018-12-20 DIAGNOSIS — H47011 Ischemic optic neuropathy, right eye: Secondary | ICD-10-CM | POA: Diagnosis not present

## 2018-12-20 DIAGNOSIS — H04123 Dry eye syndrome of bilateral lacrimal glands: Secondary | ICD-10-CM | POA: Diagnosis not present

## 2018-12-20 DIAGNOSIS — H401131 Primary open-angle glaucoma, bilateral, mild stage: Secondary | ICD-10-CM | POA: Diagnosis not present

## 2018-12-20 LAB — ANA W/REFLEX IF POSITIVE
Anti JO-1: <0.2 AI (ref 0.0–0.9)
Anti Nuclear Antibody (ANA): POSITIVE — AB
Centromere Ab Screen: <0.2 AI (ref 0.0–0.9)
Chromatin Ab SerPl-aCnc: 0.3 AI (ref 0.0–0.9)
ENA RNP Ab: 6.4 AI — ABNORMAL HIGH (ref 0.0–0.9)
ENA SM Ab Ser-aCnc: <0.2 AI (ref 0.0–0.9)
ENA SSA (RO) Ab: <0.2 AI (ref 0.0–0.9)
ENA SSB (LA) Ab: <0.2 AI (ref 0.0–0.9)
Scleroderma (Scl-70) (ENA) Antibody, IgG: <0.2 AI (ref 0.0–0.9)
dsDNA Ab: <1 IU/mL (ref 0–9)

## 2018-12-20 LAB — VITAMIN B12: Vitamin B-12: 550 pg/mL (ref 232–1245)

## 2018-12-20 LAB — HGB A1C W/O EAG: Hgb A1c MFr Bld: 5.6 % (ref 4.8–5.6)

## 2018-12-20 LAB — SEDIMENTATION RATE: Sed Rate: 51 mm/hr — ABNORMAL HIGH (ref 0–40)

## 2018-12-20 LAB — RPR: RPR Ser Ql: NONREACTIVE

## 2018-12-20 LAB — C-REACTIVE PROTEIN: CRP: 3 mg/L (ref 0–10)

## 2018-12-20 NOTE — Telephone Encounter (Signed)
[employer] medicare order sent to GI. No auth they will reach out to the patient to schedule.  

## 2018-12-20 NOTE — Telephone Encounter (Signed)
Laboratory evaluations showed  mild elevated ESR 51 with normal C-reactive protein has unknown clinical significance, also with positive ANA,  Check with patient to see if she has any headaches  Will repeat laboratory evaluation at her next follow-up visit 

## 2018-12-20 NOTE — Telephone Encounter (Signed)
I spoke to the patient and notified her of the lab results.  States she has mild headaches 2-3 times per week that respond well to ibuprofen.  She has a pending follow up on 02/26/2019.

## 2018-12-25 DIAGNOSIS — I69398 Other sequelae of cerebral infarction: Secondary | ICD-10-CM | POA: Diagnosis not present

## 2018-12-25 DIAGNOSIS — I10 Essential (primary) hypertension: Secondary | ICD-10-CM | POA: Diagnosis not present

## 2018-12-25 DIAGNOSIS — R7302 Impaired glucose tolerance (oral): Secondary | ICD-10-CM | POA: Diagnosis not present

## 2018-12-25 DIAGNOSIS — E0580 Other thyrotoxicosis without thyrotoxic crisis or storm: Secondary | ICD-10-CM | POA: Diagnosis not present

## 2018-12-28 ENCOUNTER — Encounter: Payer: Self-pay | Admitting: Cardiology

## 2018-12-28 ENCOUNTER — Ambulatory Visit (INDEPENDENT_AMBULATORY_CARE_PROVIDER_SITE_OTHER): Payer: Medicare Other | Admitting: Cardiology

## 2018-12-28 ENCOUNTER — Other Ambulatory Visit: Payer: Self-pay

## 2018-12-28 ENCOUNTER — Other Ambulatory Visit: Payer: Self-pay | Admitting: Cardiology

## 2018-12-28 VITALS — BP 201/90 | HR 90 | Temp 97.7°F | Ht 69.0 in | Wt 176.0 lb

## 2018-12-28 DIAGNOSIS — I10 Essential (primary) hypertension: Secondary | ICD-10-CM | POA: Diagnosis not present

## 2018-12-28 DIAGNOSIS — I493 Ventricular premature depolarization: Secondary | ICD-10-CM

## 2018-12-28 DIAGNOSIS — E7800 Pure hypercholesterolemia, unspecified: Secondary | ICD-10-CM

## 2018-12-28 DIAGNOSIS — H538 Other visual disturbances: Secondary | ICD-10-CM

## 2018-12-28 DIAGNOSIS — I272 Pulmonary hypertension, unspecified: Secondary | ICD-10-CM | POA: Diagnosis not present

## 2018-12-28 DIAGNOSIS — I83893 Varicose veins of bilateral lower extremities with other complications: Secondary | ICD-10-CM

## 2018-12-28 MED ORDER — HYDROCHLOROTHIAZIDE 12.5 MG PO TABS: 12.5000 mg | ORAL_TABLET | Freq: Every day | ORAL | 1 refills | Status: DC

## 2018-12-28 MED ORDER — AMLODIPINE BESYLATE 5 MG PO TABS: 5.0000 mg | ORAL_TABLET | Freq: Every day | ORAL | 1 refills | Status: DC

## 2018-12-28 NOTE — Progress Notes (Signed)
Primary Physician:  Gloria Lei, MD   Patient ID: [name], female    DOB: December 06, 1935, 83 y.o.   MRN: [id number]  Subjective:    Chief Complaint  Patient presents with  . PVC  . Hypertension  . Follow-up    eco results    HPI: Gloria Murphy  is a 83 y.o. female  with hypertension, hyperlipidemia, type 2 diabetes, history of stage 1 left ovarian carcinoid tumor, abdominal pelvic mass recently evaluated by [place] for symptoms of angina and frequent PVC's. Due to concerning symptoms of angina, she underwent stress testing that was considered low risk study. Due to vision changes underwent CTA of head and neck that showed bilateral carotid siphon stenosis, no evidence of acute CVA.  She was last seen 4 weeks ago for follow up. She was started on Olmesartan for hypertension; however, reports that her PCP one week ago stopped the medication. She is unsure of etiology for this.   Patient states that she is overall doing well. No chest pain or significant shortness of breath. She has had history of frequent PVC's, which are asymptomatic. She has recently undergone echocardiogram and here to discuss results.  She continues to have blurriness in her right eye, but denies any symptoms in her left eye. Vision has not worsened since last seen by [place]. No weakness, slurred speech, or numbness. She is being followed by opthalmology, and noted to have right optic nerve swelling with possible infarction on the superior aspect of the nerve. She has now seen Dr. Krista Blue, who has ordered MRI for further evaluation  She has chronic leg edema that she states is stable. Felt to be related to venous insufficiency. She has been evaluated by V[facility] in June 2019 with normal ABI.   She states that hyperlipidemia, and diabetes are well controlled. She denies any history of tobacco, alcohol, or drug use. No family history of heart disease. She was noted to have complex solid pelvic mass by CT scan in March 2019, and  in view of her history of ovarian cancer, gynecology was obtained. Negative for carcinoid syndrome and recommended routine follow ups.   Past Medical History:  Diagnosis Date  . Diabetes mellitus without complication ([place])   . High cholesterol   . Hypertension     Past Surgical History:  Procedure Laterality Date  . ABDOMINAL HYSTERECTOMY    . EYE SURGERY Bilateral 06/2016   Cataract  . KNEE SURGERY Right   . LAPAROTOMY N/A 08/31/2017   Procedure: EXPLORATORY LAPAROTOMY; BILATERAL SALPINGOOPHERECTOMY; OMENTECTOMY, TUMOR DEBULKING;  Surgeon: Everitt Amber, MD;  Location: [facility];  Service: Gynecology;  Laterality: N/A;  . MASS EXCISION N/A 08/31/2017   Procedure: RESECTION OF PELVIC MASS;  Surgeon: Everitt Amber, MD;  Location: [facility];  Service: Gynecology;  Laterality: N/A;    Social History   Socioeconomic History  . Marital status: Married    Spouse name: Not on file  . Number of children: 3  . Years of education: 74  . Highest education level: High school graduate  Occupational History  . Occupation: Retired  Scientific laboratory technician  . Financial resource strain: Not on file  . Food insecurity    Worry: Not on file    Inability: Not on file  . Transportation needs    Medical: Not on file    Non-medical: Not on file  Tobacco Use  . Smoking status: Never Smoker  . Smokeless tobacco: Never Used  Substance and Sexual Activity  .  Alcohol use: No  . Drug use: No  . Sexual activity: Not on file  Lifestyle  . Physical activity    Days per week: Not on file    Minutes per session: Not on file  . Stress: Not on file  Relationships  . Social Herbalist on phone: Not on file    Gets together: Not on file    Attends religious service: Not on file    Active member of club or organization: Not on file    Attends meetings of clubs or organizations: Not on file    Relationship status: Not on file  . Intimate partner violence    Fear of current or ex partner: Not on file     Emotionally abused: Not on file    Physically abused: Not on file    Forced sexual activity: Not on file  Other Topics Concern  . Not on file  Social History Narrative   Lives at home with her husband.   Right-handed.   Occasional caffeine.    Review of Systems  Constitution: Positive for malaise/fatigue. Negative for decreased appetite, weight gain and weight loss.  Eyes: Positive for blurred vision (previously bilaterl; now only in right eye). Negative for visual disturbance.  Cardiovascular: Positive for dyspnea on exertion and leg swelling. Negative for chest pain, claudication, orthopnea, palpitations and syncope.  Respiratory: Negative for hemoptysis and wheezing.   Endocrine: Negative for cold intolerance and heat intolerance.  Hematologic/Lymphatic: Does not bruise/bleed easily.  Skin: Negative for nail changes.  Musculoskeletal: Negative for muscle weakness and myalgias.  Gastrointestinal: Negative for abdominal pain, change in bowel habit, nausea and vomiting.  Neurological: Negative for difficulty with concentration, dizziness, focal weakness and headaches.  Psychiatric/Behavioral: Negative for altered mental status and suicidal ideas.  All other systems reviewed and are negative.     Objective:  Blood pressure (!) 201/90, pulse 90, temperature 97.7 F (36.5 C), height '5\' 9"'$  (1.753 m), weight 176 lb (79.8 kg), SpO2 100 %. Body mass index is 25.99 kg/m.    Physical Exam  Constitutional: She is oriented to person, place, and time. Vital signs are normal. She appears well-developed and well-nourished.  HENT:  Head: Normocephalic and atraumatic.  Neck: Normal range of motion.  Cardiovascular: Normal rate, regular rhythm, normal heart sounds and intact distal pulses. Frequent extrasystoles are present.  Pulmonary/Chest: Effort normal and breath sounds normal. No accessory muscle usage. No respiratory distress.  Abdominal: Soft. Bowel sounds are normal.  Musculoskeletal:  Normal range of motion.  Neurological: She is alert and oriented to person, place, and time.  Skin: Skin is warm and dry.  Vitals reviewed.  Radiology: CTA neck 10/26/2018:  Normal appearance of the brain for age.   Minimal atherosclerosis of the aortic arch and at both carotid bifurcations. No stenosis.   Both cervical internal carotid arteries are small vessels but do not show a focal stenosis or irregularity. The vertebral arteries are actually larger than the internal carotid arteries.   Atherosclerosis in the carotid siphon regions with stenosis estimated at 50-70% on each side. No intracranial branch vessel stenosis or occlusion.  CTA head 10/26/2018:  Normal appearance of the brain for age.   Minimal atherosclerosis of the aortic arch and at both carotid bifurcations. No stenosis.   Both cervical internal carotid arteries are small vessels but do not show a focal stenosis or irregularity. The vertebral arteries are actually larger than the internal carotid arteries.   Atherosclerosis in the  carotid siphon regions with stenosis estimated at 50-70% on each side. No intracranial branch vessel stenosis or occlusion.   Laboratory examination:   PCP labs 10/17/2018: Cholesterol 211, triglycerides 146, HDL 47, LDL 136. Creatinine 0.91, eGFR 69, potassium 4.2, CMP normal. Hgb 11.2, Hct 34, CBC otherwise normal. TSH low at 0.03, T3 and T 4 normal. HgbA1c 5.4%.   CMP Latest Ref Rng & Units 10/25/2017 10/06/2017 09/05/2017  Glucose 65 - 99 mg/dL 106(H) 104 86  BUN 6 - 20 mg/dL '13 15 9  '$ Creatinine 0.44 - 1.00 mg/dL 0.90 1.03 0.83  Sodium 135 - 145 mmol/L 140 138 137  Potassium 3.5 - 5.1 mmol/L 3.8 4.2 2.9(L)  Chloride 101 - 111 mmol/L 100(L) 102 98(L)  CO2 22 - 29 mmol/L - 26 27  Calcium 8.4 - 10.4 mg/dL - 9.9 9.1  Total Protein 6.4 - 8.3 g/dL - 8.4(H) -  Total Bilirubin 0.2 - 1.2 mg/dL - 0.4 -  Alkaline Phos 40 - 150 U/L - 61 -  AST 5 - 34 U/L - 21 -  ALT 0 - 55 U/L - 10  -   CBC Latest Ref Rng & Units 10/25/2017 10/25/2017 10/06/2017  WBC 4.0 - 10.5 K/uL - 3.9(L) 4.2  Hemoglobin 12.0 - 15.0 g/dL 11.6(L) 11.3(L) 11.5(L)  Hematocrit 36.0 - 46.0 % 34.0(L) 35.8(L) 35.3  Platelets 150 - 400 K/uL - 203 194   Lipid Panel     Component Value Date/Time   CHOL  10/31/2007 1600    112        ATP III CLASSIFICATION:  <200     mg/dL   Desirable  200-239  mg/dL   Borderline High  >=240    mg/dL   High   TRIG 51 10/31/2007 1600   HDL 42 10/31/2007 1600   CHOLHDL 2.7 10/31/2007 1600   VLDL 10 10/31/2007 1600   LDLCALC  10/31/2007 1600    60        Total Cholesterol/HDL:CHD Risk Coronary Heart Disease Risk Table                     Men   Women  1/2 Average Risk   3.4   3.3   HEMOGLOBIN A1C Lab Results  Component Value Date   HGBA1C 5.6 12/19/2018   MPG 108.28 08/28/2017   TSH No results for input(s): TSH in the last 8760 hours.  PRN Meds:. Medications Discontinued During This Encounter  Medication Reason  . glimepiride (AMARYL) 1 MG tablet Discontinued by provider   Current Meds  Medication Sig  . aspirin EC 81 MG tablet Take 1 tablet (81 mg total) by mouth daily.  . Calcium Carbonate (CALTRATE 600 PO) Take 1 tablet by mouth daily.  . carboxymethylcellulose (REFRESH TEARS) 0.5 % SOLN Place 1 drop into both eyes 3 (three) times daily.  . ferrous sulfate 325 (65 FE) MG tablet Take 325 mg by mouth daily with breakfast.  . Olmesartan-amLODIPine-HCTZ 40-5-12.5 MG TABS Take 1 tablet orally daily  . omeprazole (PRILOSEC) 20 MG capsule Take 1 capsule (20 mg total) by mouth daily.  . Potassium 99 MG TABS Take 1 tablet by mouth daily.  . rosuvastatin (CRESTOR) 40 MG tablet Take 1 tablet (40 mg total) by mouth daily.    Cardiac Studies:   Lexiscan Myoview Stress Test 11/07/2018: Stress EKG is non-diagnostic, as this is pharmacological stress test. Occasional PVC noted through out the stress test.  Myocardial pefusion imaging is normal. Left ventricular  ejection fraction is  72% with normal wall motion.  Low risk study.  Echocardiogram 12/18/2018: Left ventricle cavity is normal in size. Normal left ventricular wall thickness. Normal global wall motion. Normal LV systolic function with EF 69%. Doppler evidence of grade I (impaired) diastolic dysfunction, normal LAP.  Left atrial cavity is mildly dilated. Moderate tricuspid regurgitation. Moderate pulmonary hypertension. Estimated pulmonary artery systolic pressure is 56 mmHg.   Assessment:     ICD-10-CM   1. Essential hypertension  I10   2. Pulmonary hypertension, unspecified (HCC)  I27.20   3. PVC (premature ventricular contraction)  I49.3   4. Blurred vision, right eye  H53.8   5. Hypercholesteremia  E78.00   6. Varicose veins of leg with edema, bilateral  I83.893      EKG 10/26/2018: Normal sinus rhythm at 76 bpm with 2 PVC's, borderline left atrial enlargement, no evidence of ischemia.  Recommendations:   Patient presents for follow-up, I discussed recently obtained echocardiogram results with the patient.  Has normal LVEF, incidentally was found to have elevated PA pressure suggestive of pulmonary hypertension.  I cannot explain her pulmonary hypertension unless potentially caused by her marked systemic hypertension.  I had placed her on olmesartan at her last office visit, but was stopped by her PCP.  Etiology for this is unknown, I will request records from PCP office.  In view of her marked hypertension and potential pulmonary hypertension by echocardiogram, I will start her back on amlodipine and hydrochlorothiazide.  We will plan to repeat echocardiogram in 3 months and see if PA pressures improved once her blood pressure is controlled, if she continues to have elevated PA pressures, will further evaluate. She is not noted to have PVC's on exam today.   She does have some intermittent dizziness suggestive of potential orthostatic hypotension as well as venous insufficiency.  I  have encouraged her to use her stockings daily to help prevent this.  She continues to have right eye blurriness and is being evaluated by neurology with MRI.  Has also had labs and found to have positive ANA.  She will continue to need statin and aspirin therapy for carotid siphon stenosis noted on CTA of the neck. She has not had further episodes of chest pain. Dyspnea on exertion has been stable. She does mention tingling and pain in her legs mostly at night. Potentially related to peripheral neuropathy. She may benefit from Gabapentin, I have asked her to discuss with her PCP if she feels that this may be appropriate. I will see her back in 4 weeks for follow up.    *I have discussed this case with Dr. Virgina Jock and he participated in formulating the plan.*   Miquel Dunn, MSN, APRN, FNP-C [facility] [place] Office: [phone] Fax: [phone]

## 2019-01-08 DIAGNOSIS — E039 Hypothyroidism, unspecified: Secondary | ICD-10-CM | POA: Diagnosis not present

## 2019-01-08 DIAGNOSIS — I10 Essential (primary) hypertension: Secondary | ICD-10-CM | POA: Diagnosis not present

## 2019-01-25 ENCOUNTER — Other Ambulatory Visit: Payer: Self-pay

## 2019-01-25 ENCOUNTER — Ambulatory Visit
Admission: RE | Admit: 2019-01-25 | Discharge: 2019-01-25 | Disposition: A | Discharge status: Home / Self Care | Payer: Medicare Other | Source: Ambulatory Visit | Attending: Neurology | Admitting: Neurology

## 2019-01-25 DIAGNOSIS — H539 Unspecified visual disturbance: Secondary | ICD-10-CM

## 2019-01-25 DIAGNOSIS — G459 Transient cerebral ischemic attack, unspecified: Secondary | ICD-10-CM | POA: Diagnosis not present

## 2019-01-25 MED ORDER — GADOBENATE DIMEGLUMINE 529 MG/ML IV SOLN
15.0000 mL | Freq: Once | INTRAVENOUS | Status: AC | PRN
  Administered 2019-01-25: 15 mL via INTRAVENOUS

## 2019-01-28 ENCOUNTER — Telehealth: Payer: Self-pay | Admitting: Neurology

## 2019-01-28 NOTE — Telephone Encounter (Signed)
Please call patient, MRI of the brain showed mild age-related changes, there was no acute abnormalities,  MRA of the brain and neck showed mild bilateral internal carotid artery stenosis near the bifurcation, moderate intracranial atherosclerotic disease, she is to continue aspirin 81 mg daily  IMPRESSION: This MRI of the brain without contrast shows the following: 1.   T2/flair hyperintense foci in the hemispheres consistent with minimal chronic microvascular ischemic change, typical for age. 2.   There were no acute findings.   IMPRESSION: This MR angiogram of the intracranial arteries shows the following: 1.   50 to 60% stenosis of the right internal carotid artery at the carotid siphon 2.   60 to 70% stenosis of the left internal carotid artery at the carotid siphon.  IMPRESSION: This MR angiogram of the neck arteries shows the following: 1.   In the neck, there is minimal stenosis of both internal carotid arteries near the bifurcation. 2.   Intracranially, there is greater than 50% stenosis of both internal carotid arteries at the carotid siphons.

## 2019-01-28 NOTE — Telephone Encounter (Signed)
Left message requesting a call back.

## 2019-01-29 ENCOUNTER — Encounter: Payer: Self-pay | Admitting: *Deleted

## 2019-01-29 DIAGNOSIS — I10 Essential (primary) hypertension: Secondary | ICD-10-CM | POA: Diagnosis not present

## 2019-01-29 DIAGNOSIS — E039 Hypothyroidism, unspecified: Secondary | ICD-10-CM | POA: Diagnosis not present

## 2019-01-29 NOTE — Telephone Encounter (Signed)
Left second message, on home number, requesting a return call.  Also, attempted mobile number on file twice.  No answer and voicemail not set up.

## 2019-01-29 NOTE — Telephone Encounter (Signed)
Left third message for patient, requesting a call back, on home number.  Still no answer or voicemail on her mobile number.  She has a pending appt with Dr. Krista Blue on 02/26/2019.  Test results will be reviewed in detail at that time.  If patient returns our call prior to her appt, we are happy to review over the phone too.  I will also send an unable to contact letter to her home address.

## 2019-01-30 NOTE — Telephone Encounter (Signed)
I was able to speak to the patient and reviewed the results with her.  She will continue her aspirin 81mg  daily and keep her pending follow up on 02/26/2019.

## 2019-01-31 ENCOUNTER — Other Ambulatory Visit: Payer: Self-pay

## 2019-01-31 NOTE — Patient Outreach (Signed)
[facility] Care Management  01/31/2019  Gloria Murphy 08/23/1935 [id number]   Medication Adherence call to Mrs. Gloria Murphy  Spoke with patient she wants a call back at a later time,patient is showing past due on Rosuvastatin 40 mg under Gloria Murphy.   [place] Management Direct Dial [phone]  Fax [phone] [email or web address]

## 2019-02-04 ENCOUNTER — Encounter: Payer: Self-pay | Admitting: Cardiology

## 2019-02-04 ENCOUNTER — Telehealth: Payer: Self-pay | Admitting: Neurology

## 2019-02-04 ENCOUNTER — Other Ambulatory Visit: Payer: Self-pay

## 2019-02-04 ENCOUNTER — Ambulatory Visit: Payer: Medicare Other | Admitting: Cardiology

## 2019-02-04 VITALS — BP 138/71 | HR 75 | Ht 69.0 in | Wt 171.9 lb

## 2019-02-04 DIAGNOSIS — R0609 Other forms of dyspnea: Secondary | ICD-10-CM | POA: Diagnosis not present

## 2019-02-04 DIAGNOSIS — I272 Pulmonary hypertension, unspecified: Secondary | ICD-10-CM | POA: Diagnosis not present

## 2019-02-04 MED ORDER — FUROSEMIDE 20 MG PO TABS: 20.0000 mg | ORAL_TABLET | Freq: Every day | ORAL | 2 refills | Status: DC

## 2019-02-04 NOTE — Progress Notes (Addendum)
Follow up visit  Subjective:   Gloria Murphy, female    DOB: 09/23/1935, 83 y.o.   MRN: [id number]   Chief Complaint  Patient presents with  . Hypertension  . Follow-up    55 WEEK    HPI  83 year old African-American female with hypertension, hyperlipidemia, type 2 diabetes mellitus, history of ovarian carcinoid tumor, initially seen for symptoms of angina, and acute visual changes.  Cardiac work-up showed no evidence of ischemia or infarction on nuclear stress test.  Echocardiogram showed normal EF, grade 1 diastolic dysfunction, mild left atrial dilatation, moderate tricuspid regurgitation with moderate pulmonary hypertension, PASP 56 mmhg.   Patient is being seen by neurologist Dr. Krista Blue for her visual changes.  Given her elevated ESR and positive ANA, and there was concern for temporal arteritis.  MRI of the brain did not show any acute stroke findings.  It did show minimal chronic microvascular ischemic changes typical for age.  MR angiogram showed moderate bilateral moderate carotid stenosis.  Patient is here for follow-up visit today.  Patient has improved.  However, on specific questioning, patient endorses worsening shortness of breath with minimal exertion.  She gets short of breath with minimal activity such as getting in and out of the car.  On flat surface she is able to walk at slow pace without much difficulty.  She denies any exertional chest pain.  She endorses occasional lightheadedness with exertion.  6-minute walk test was performed today, on which, patient walked for 292 m.  Her O2 saturation dropped to as low as 79%, recovered to 92% after rest.  On a separate note, patient reports weight loss.  She tells me that Dr. Criss Rosales was concern regarding thyroid abnormality, possibly hypothyroidism, as etiology of weight loss.  Thyroid heterogeneity without significant nodule or any other acute findings. Thyroid ultrasound showed nonspecific   Past Medical History:  Diagnosis  Date  . Diabetes mellitus without complication ([place])   . High cholesterol   . Hypertension     Past Surgical History:  Procedure Laterality Date  . ABDOMINAL HYSTERECTOMY    . EYE SURGERY Bilateral 06/2016   Cataract  . KNEE SURGERY Right   . LAPAROTOMY N/A 08/31/2017   Procedure: EXPLORATORY LAPAROTOMY; BILATERAL SALPINGOOPHERECTOMY; OMENTECTOMY, TUMOR DEBULKING;  Surgeon: Everitt Amber, MD;  Location: [facility];  Service: Gynecology;  Laterality: N/A;  . MASS EXCISION N/A 08/31/2017   Procedure: RESECTION OF PELVIC MASS;  Surgeon: Everitt Amber, MD;  Location: [facility];  Service: Gynecology;  Laterality: N/A;    Social History   Socioeconomic History  . Marital status: Married    Spouse name: Not on file  . Number of children: 3  . Years of education: 44  . Highest education level: High school graduate  Occupational History  . Occupation: Retired  Scientific laboratory technician  . Financial resource strain: Not on file  . Food insecurity    Worry: Not on file    Inability: Not on file  . Transportation needs    Medical: Not on file    Non-medical: Not on file  Tobacco Use  . Smoking status: Never Smoker  . Smokeless tobacco: Never Used  Substance and Sexual Activity  . Alcohol use: No  . Drug use: No  . Sexual activity: Not on file  Lifestyle  . Physical activity    Days per week: Not on file    Minutes per session: Not on file  . Stress: Not on file  Relationships  . Social connections  Talks on phone: Not on file    Gets together: Not on file    Attends religious service: Not on file    Active member of club or organization: Not on file    Attends meetings of clubs or organizations: Not on file    Relationship status: Not on file  . Intimate partner violence    Fear of current or ex partner: Not on file    Emotionally abused: Not on file    Physically abused: Not on file    Forced sexual activity: Not on file  Other Topics Concern  . Not on file  Social History Narrative    Lives at home with her husband.   Right-handed.   Occasional caffeine.    Family History  Problem Relation Age of Onset  . Heart disease Mother   . Diabetes Mother   . Kidney failure Father   . Breast cancer Neg Hx     Current Outpatient Medications on File Prior to Visit  Medication Sig Dispense Refill  . amLODipine (NORVASC) 5 MG tablet TAKE 1 TABLET BY MOUTH EVERY DAY. 90 tablet 3  . aspirin EC 81 MG tablet Take 1 tablet (81 mg total) by mouth daily. 90 tablet 3  . Calcium Carbonate (CALTRATE 600 PO) Take 1 tablet by mouth daily.    . carboxymethylcellulose (REFRESH TEARS) 0.5 % SOLN Place 1 drop into both eyes 3 (three) times daily.    . ferrous sulfate 325 (65 FE) MG tablet Take 325 mg by mouth daily with breakfast.    . hydrochlorothiazide (HYDRODIURIL) 12.5 MG tablet TAKE 1 TABLET BY MOUTH EVERY DAY. 90 tablet 3  . Olmesartan-amLODIPine-HCTZ 40-5-12.5 MG TABS Take 1 tablet orally daily  0  . omeprazole (PRILOSEC) 20 MG capsule Take 1 capsule (20 mg total) by mouth daily. 30 capsule 0  . Potassium 99 MG TABS Take 1 tablet by mouth daily.    . rosuvastatin (CRESTOR) 40 MG tablet Take 1 tablet (40 mg total) by mouth daily. 90 tablet 3   No current facility-administered medications on file prior to visit.     Cardiovascular studies:  Lexiscan Myoview Stress Test07/05/2018: Stress EKG is non-diagnostic, as this is pharmacological stress test. Occasional PVC noted through out the stress test.  Myocardial pefusion imaging is normal. Left ventricular ejection fraction is 72% with normal wall motion.  Low risk study.  Echocardiogram 12/18/2018: Left ventricle cavity is normal in size. Normal left ventricular wall thickness. Normal global wall motion. Normal LV systolic function with EF 69%. Doppler evidence of grade I (impaired) diastolic dysfunction, normal LAP.  Left atrial cavity is mildly dilated. Moderate tricuspid regurgitation. Moderate pulmonary hypertension. Estimated  pulmonary artery systolic pressure is 56 mmHg.   Recent labs: Results for Gloria Murphy, Gloria Murphy (MRN [id number]) as of 02/04/2019 20:27  Ref. Range 12/19/2018 15:30  SEE BELOW Unknown Comment  Anti Nuclear Antibody (ANA) Latest Ref Range: Negative  Positive (A)  Anti JO-1 Latest Ref Range: 0.0 - 0.9 AI <0.2  CENTROMERE AB SCREEN Latest Ref Range: 0.0 - 0.9 AI <0.2  dsDNA Ab Latest Ref Range: 0 - 9 IU/mL <1  ENA RNP Ab Latest Ref Range: 0.0 - 0.9 AI 6.4 (H)  ENA SSA (RO) Ab Latest Ref Range: 0.0 - 0.9 AI <0.2  ENA SSB (LA) Ab Latest Ref Range: 0.0 - 0.9 AI <0.2  ENA SM Ab Ser-aCnc Latest Ref Range: 0.0 - 0.9 AI <0.2  Chromatin Ab SerPl-aCnc Latest Ref Range: 0.0 - 0.9  AI 0.3  Scleroderma (Scl-70) (ENA) Antibody, IgG Latest Ref Range: 0.0 - 0.9 AI <0.2    Review of Systems  Constitution: Positive for weight loss. Negative for decreased appetite, malaise/fatigue and weight gain.  HENT: Negative for congestion.   Eyes: Negative for visual disturbance.  Cardiovascular: Positive for dyspnea on exertion and leg swelling. Negative for chest pain, palpitations and syncope.  Respiratory: Negative for cough.   Endocrine: Negative for cold intolerance.  Hematologic/Lymphatic: Does not bruise/bleed easily.  Skin: Negative for itching and rash.  Musculoskeletal: Negative for myalgias.  Gastrointestinal: Negative for abdominal pain, nausea and vomiting.  Genitourinary: Negative for dysuria.  Neurological: Negative for dizziness and weakness.  Psychiatric/Behavioral: The patient is not nervous/anxious.   All other systems reviewed and are negative.        Vitals:   02/04/19 1459  BP: 138/71  Pulse: 75  SpO2: 97%    Body mass index is 25.39 kg/m. Filed Weights   02/04/19 1459  Weight: 171 lb 14.4 oz (78 kg)    Objective:   Physical Exam  Constitutional: She is oriented to person, place, and time. She appears well-developed and well-nourished. No distress.  HENT:  Head: Normocephalic  and atraumatic.  Eyes: Pupils are equal, round, and reactive to light. Conjunctivae are normal.  Neck: No JVD present.  Cardiovascular: Normal rate, regular rhythm and intact distal pulses.  Pulmonary/Chest: Effort normal and breath sounds normal. She has no wheezes. She has no rales.  Abdominal: Soft. Bowel sounds are normal. There is no rebound.  Musculoskeletal:        General: Edema present.  Lymphadenopathy:    She has no cervical adenopathy.  Neurological: She is alert and oriented to person, place, and time. No cranial nerve deficit.  Skin: Skin is warm and dry.  Psychiatric: She has a normal mood and affect.  Nursing note and vitals reviewed.         Assessment & Recommendations:   83 year old African-American female with hypertension, hyperlipidemia, type 2 diabetes mellitus, history of ovarian carcinoid tumor, initially seen for symptoms of angina, and acute visual changes, now with new diagnosis of pulmonary hypertension  Pulmonary hypertension: WHO functional class II-3 symptoms. Estimated PASP 56 mmHg on echocardiogram, fortunately without any RV dysfunction. Etiology less likely to be WHO group 2.  Given her elevated ESR, ENA, Group 1 PAH possible, due to mixed connective tissue disease.  She needs thorough evaluation including the following. Will check CBC, CMP, ESR, ANA, TSH. Will defer to sleep study to Neurology/pulmonology. Refer to pulmonology for evaluation with pulmonary function test, and CT chest. Will perform right heart catheterization to establish the diagnosis and classification of her pulmonary hypertension.  In the meantime, started low dose lasix 20 mg daily.  Will arrange home oxygen  Hypertension: Controlled.  Vision changes: Etiology remains unclear. Visual changes have resolved.   Total time spent with patient was 40 minutes and greater than 50% of that time was spent in counseling and coordination care with the patient regarding complex  decision making and discussion as state above.   Nigel Mormon, MD [facility] Pager: [phone] Office: [phone] If no answer Cell [phone]

## 2019-02-04 NOTE — Telephone Encounter (Signed)
Phone rep checked office voicemail;pt is asking for a call from RN to discuss her medications.  this voicemail was left @9 :55a.m.

## 2019-02-04 NOTE — H&P (View-Only) (Signed)
Follow up visit  Subjective:   Gloria Murphy, female    DOB: 09/23/1935, 83 y.o.   MRN: [id number]   Chief Complaint  Patient presents with  . Hypertension  . Follow-up    55 WEEK    HPI  83 year old African-American female with hypertension, hyperlipidemia, type 2 diabetes mellitus, history of ovarian carcinoid tumor, initially seen for symptoms of angina, and acute visual changes.  Cardiac work-up showed no evidence of ischemia or infarction on nuclear stress test.  Echocardiogram showed normal EF, grade 1 diastolic dysfunction, mild left atrial dilatation, moderate tricuspid regurgitation with moderate pulmonary hypertension, PASP 56 mmhg.   Patient is being seen by neurologist Dr. Krista Blue for her visual changes.  Given her elevated ESR and positive ANA, and there was concern for temporal arteritis.  MRI of the brain did not show any acute stroke findings.  It did show minimal chronic microvascular ischemic changes typical for age.  MR angiogram showed moderate bilateral moderate carotid stenosis.  Patient is here for follow-up visit today.  Patient has improved.  However, on specific questioning, patient endorses worsening shortness of breath with minimal exertion.  She gets short of breath with minimal activity such as getting in and out of the car.  On flat surface she is able to walk at slow pace without much difficulty.  She denies any exertional chest pain.  She endorses occasional lightheadedness with exertion.  6-minute walk test was performed today, on which, patient walked for 292 m.  Her O2 saturation dropped to as low as 79%, recovered to 92% after rest.  On a separate note, patient reports weight loss.  She tells me that Dr. Criss Rosales was concern regarding thyroid abnormality, possibly hypothyroidism, as etiology of weight loss.  Thyroid heterogeneity without significant nodule or any other acute findings. Thyroid ultrasound showed nonspecific   Past Medical History:  Diagnosis  Date  . Diabetes mellitus without complication ([place])   . High cholesterol   . Hypertension     Past Surgical History:  Procedure Laterality Date  . ABDOMINAL HYSTERECTOMY    . EYE SURGERY Bilateral 06/2016   Cataract  . KNEE SURGERY Right   . LAPAROTOMY N/A 08/31/2017   Procedure: EXPLORATORY LAPAROTOMY; BILATERAL SALPINGOOPHERECTOMY; OMENTECTOMY, TUMOR DEBULKING;  Surgeon: Everitt Amber, MD;  Location: [facility];  Service: Gynecology;  Laterality: N/A;  . MASS EXCISION N/A 08/31/2017   Procedure: RESECTION OF PELVIC MASS;  Surgeon: Everitt Amber, MD;  Location: [facility];  Service: Gynecology;  Laterality: N/A;    Social History   Socioeconomic History  . Marital status: Married    Spouse name: Not on file  . Number of children: 3  . Years of education: 44  . Highest education level: High school graduate  Occupational History  . Occupation: Retired  Scientific laboratory technician  . Financial resource strain: Not on file  . Food insecurity    Worry: Not on file    Inability: Not on file  . Transportation needs    Medical: Not on file    Non-medical: Not on file  Tobacco Use  . Smoking status: Never Smoker  . Smokeless tobacco: Never Used  Substance and Sexual Activity  . Alcohol use: No  . Drug use: No  . Sexual activity: Not on file  Lifestyle  . Physical activity    Days per week: Not on file    Minutes per session: Not on file  . Stress: Not on file  Relationships  . Social connections  Talks on phone: Not on file    Gets together: Not on file    Attends religious service: Not on file    Active member of club or organization: Not on file    Attends meetings of clubs or organizations: Not on file    Relationship status: Not on file  . Intimate partner violence    Fear of current or ex partner: Not on file    Emotionally abused: Not on file    Physically abused: Not on file    Forced sexual activity: Not on file  Other Topics Concern  . Not on file  Social History Narrative    Lives at home with her husband.   Right-handed.   Occasional caffeine.    Family History  Problem Relation Age of Onset  . Heart disease Mother   . Diabetes Mother   . Kidney failure Father   . Breast cancer Neg Hx     Current Outpatient Medications on File Prior to Visit  Medication Sig Dispense Refill  . amLODipine (NORVASC) 5 MG tablet TAKE 1 TABLET BY MOUTH EVERY DAY. 90 tablet 3  . aspirin EC 81 MG tablet Take 1 tablet (81 mg total) by mouth daily. 90 tablet 3  . Calcium Carbonate (CALTRATE 600 PO) Take 1 tablet by mouth daily.    . carboxymethylcellulose (REFRESH TEARS) 0.5 % SOLN Place 1 drop into both eyes 3 (three) times daily.    . ferrous sulfate 325 (65 FE) MG tablet Take 325 mg by mouth daily with breakfast.    . hydrochlorothiazide (HYDRODIURIL) 12.5 MG tablet TAKE 1 TABLET BY MOUTH EVERY DAY. 90 tablet 3  . Olmesartan-amLODIPine-HCTZ 40-5-12.5 MG TABS Take 1 tablet orally daily  0  . omeprazole (PRILOSEC) 20 MG capsule Take 1 capsule (20 mg total) by mouth daily. 30 capsule 0  . Potassium 99 MG TABS Take 1 tablet by mouth daily.    . rosuvastatin (CRESTOR) 40 MG tablet Take 1 tablet (40 mg total) by mouth daily. 90 tablet 3   No current facility-administered medications on file prior to visit.     Cardiovascular studies:  Lexiscan Myoview Stress Test07/05/2018: Stress EKG is non-diagnostic, as this is pharmacological stress test. Occasional PVC noted through out the stress test.  Myocardial pefusion imaging is normal. Left ventricular ejection fraction is 72% with normal wall motion.  Low risk study.  Echocardiogram 12/18/2018: Left ventricle cavity is normal in size. Normal left ventricular wall thickness. Normal global wall motion. Normal LV systolic function with EF 69%. Doppler evidence of grade I (impaired) diastolic dysfunction, normal LAP.  Left atrial cavity is mildly dilated. Moderate tricuspid regurgitation. Moderate pulmonary hypertension. Estimated  pulmonary artery systolic pressure is 56 mmHg.   Recent labs: Results for Gloria Murphy, Gloria Murphy (MRN [id number]) as of 02/04/2019 20:27  Ref. Range 12/19/2018 15:30  SEE BELOW Unknown Comment  Anti Nuclear Antibody (ANA) Latest Ref Range: Negative  Positive (A)  Anti JO-1 Latest Ref Range: 0.0 - 0.9 AI <0.2  CENTROMERE AB SCREEN Latest Ref Range: 0.0 - 0.9 AI <0.2  dsDNA Ab Latest Ref Range: 0 - 9 IU/mL <1  ENA RNP Ab Latest Ref Range: 0.0 - 0.9 AI 6.4 (H)  ENA SSA (RO) Ab Latest Ref Range: 0.0 - 0.9 AI <0.2  ENA SSB (LA) Ab Latest Ref Range: 0.0 - 0.9 AI <0.2  ENA SM Ab Ser-aCnc Latest Ref Range: 0.0 - 0.9 AI <0.2  Chromatin Ab SerPl-aCnc Latest Ref Range: 0.0 - 0.9  AI 0.3  Scleroderma (Scl-70) (ENA) Antibody, IgG Latest Ref Range: 0.0 - 0.9 AI <0.2    Review of Systems  Constitution: Positive for weight loss. Negative for decreased appetite, malaise/fatigue and weight gain.  HENT: Negative for congestion.   Eyes: Negative for visual disturbance.  Cardiovascular: Positive for dyspnea on exertion and leg swelling. Negative for chest pain, palpitations and syncope.  Respiratory: Negative for cough.   Endocrine: Negative for cold intolerance.  Hematologic/Lymphatic: Does not bruise/bleed easily.  Skin: Negative for itching and rash.  Musculoskeletal: Negative for myalgias.  Gastrointestinal: Negative for abdominal pain, nausea and vomiting.  Genitourinary: Negative for dysuria.  Neurological: Negative for dizziness and weakness.  Psychiatric/Behavioral: The patient is not nervous/anxious.   All other systems reviewed and are negative.        Vitals:   02/04/19 1459  BP: 138/71  Pulse: 75  SpO2: 97%    Body mass index is 25.39 kg/m. Filed Weights   02/04/19 1459  Weight: 171 lb 14.4 oz (78 kg)    Objective:   Physical Exam  Constitutional: She is oriented to person, place, and time. She appears well-developed and well-nourished. No distress.  HENT:  Head: Normocephalic  and atraumatic.  Eyes: Pupils are equal, round, and reactive to light. Conjunctivae are normal.  Neck: No JVD present.  Cardiovascular: Normal rate, regular rhythm and intact distal pulses.  Pulmonary/Chest: Effort normal and breath sounds normal. She has no wheezes. She has no rales.  Abdominal: Soft. Bowel sounds are normal. There is no rebound.  Musculoskeletal:        General: Edema present.  Lymphadenopathy:    She has no cervical adenopathy.  Neurological: She is alert and oriented to person, place, and time. No cranial nerve deficit.  Skin: Skin is warm and dry.  Psychiatric: She has a normal mood and affect.  Nursing note and vitals reviewed.         Assessment & Recommendations:   83 year old African-American female with hypertension, hyperlipidemia, type 2 diabetes mellitus, history of ovarian carcinoid tumor, initially seen for symptoms of angina, and acute visual changes, now with new diagnosis of pulmonary hypertension  Pulmonary hypertension: WHO functional class II-3 symptoms. Estimated PASP 56 mmHg on echocardiogram, fortunately without any RV dysfunction. Etiology less likely to be WHO group 2.  Given her elevated ESR, ENA, Group 1 PAH possible, due to mixed connective tissue disease.  She needs thorough evaluation including the following. Will check CBC, CMP, ESR, ANA, TSH. Will defer to sleep study to Neurology/pulmonology. Refer to pulmonology for evaluation with pulmonary function test, and CT chest. Will perform right heart catheterization to establish the diagnosis and classification of her pulmonary hypertension.  In the meantime, started low dose lasix 20 mg daily.  Will arrange home oxygen  Hypertension: Controlled.  Vision changes: Etiology remains unclear. Visual changes have resolved.   Total time spent with patient was 40 minutes and greater than 50% of that time was spent in counseling and coordination care with the patient regarding complex  decision making and discussion as state above.   Nigel Mormon, MD [facility] Pager: [phone] Office: [phone] If no answer Cell [phone]

## 2019-02-04 NOTE — Telephone Encounter (Signed)
I returned the call to the patient. She wanted to confirm her aspirin dosage.  She verbalized understanding of Dr. Rhea Belton orders to take aspirin 81mg , one tablet daily.

## 2019-02-05 LAB — COMPREHENSIVE METABOLIC PANEL
ALT: 19 IU/L (ref 0–32)
AST: 28 IU/L (ref 0–40)
Albumin/Globulin Ratio: 1.1 — ABNORMAL LOW (ref 1.2–2.2)
Albumin: 4.1 g/dL (ref 3.6–4.6)
Alkaline Phosphatase: 89 IU/L (ref 39–117)
BUN/Creatinine Ratio: 14 (ref 12–28)
BUN: 15 mg/dL (ref 8–27)
Bilirubin Total: 0.4 mg/dL (ref 0.0–1.2)
CO2: 27 mmol/L (ref 20–29)
Calcium: 10.1 mg/dL (ref 8.7–10.3)
Chloride: 103 mmol/L (ref 96–106)
Creatinine, Ser: 1.06 mg/dL — ABNORMAL HIGH (ref 0.57–1.00)
GFR calc Af Amer: 57 mL/min/{1.73_m2} — ABNORMAL LOW (ref >59)
GFR calc non Af Amer: 49 mL/min/{1.73_m2} — ABNORMAL LOW (ref >59)
Globulin, Total: 3.6 g/dL (ref 1.5–4.5)
Glucose: 154 mg/dL — ABNORMAL HIGH (ref 65–99)
Potassium: 3.8 mmol/L (ref 3.5–5.2)
Sodium: 141 mmol/L (ref 134–144)
Total Protein: 7.7 g/dL (ref 6.0–8.5)

## 2019-02-05 LAB — CBC
Hematocrit: 36 % (ref 34.0–46.6)
Hemoglobin: 11.9 g/dL (ref 11.1–15.9)
MCH: 26.6 pg (ref 26.6–33.0)
MCHC: 33.1 g/dL (ref 31.5–35.7)
MCV: 80 fL (ref 79–97)
Platelets: 179 10*3/uL (ref 150–450)
RBC: 4.48 x10E6/uL (ref 3.77–5.28)
RDW: 13.7 % (ref 11.7–15.4)
WBC: 4.5 10*3/uL (ref 3.4–10.8)

## 2019-02-05 LAB — SEDIMENTATION RATE: Sed Rate: 35 mm/hr (ref 0–40)

## 2019-02-05 LAB — TSH: TSH: 0.024 u[IU]/mL — ABNORMAL LOW (ref 0.450–4.500)

## 2019-02-07 ENCOUNTER — Other Ambulatory Visit (HOSPITAL_COMMUNITY)
Admission: RE | Admit: 2019-02-07 | Discharge: 2019-02-07 | Disposition: A | Discharge status: Home / Self Care | Payer: Medicare Other | Source: Ambulatory Visit | Attending: Cardiology | Admitting: Cardiology

## 2019-02-07 DIAGNOSIS — Z01812 Encounter for preprocedural laboratory examination: Secondary | ICD-10-CM | POA: Insufficient documentation

## 2019-02-07 DIAGNOSIS — Z20828 Contact with and (suspected) exposure to other viral communicable diseases: Secondary | ICD-10-CM | POA: Insufficient documentation

## 2019-02-07 LAB — ENA+DNA/DS+ANTICH+CENTRO+FA...
Anti JO-1: <0.2 AI (ref 0.0–0.9)
Centromere Ab Screen: <0.2 AI (ref 0.0–0.9)
Chromatin Ab SerPl-aCnc: 0.4 AI (ref 0.0–0.9)
ENA RNP Ab: 6.2 AI — ABNORMAL HIGH (ref 0.0–0.9)
ENA SM Ab Ser-aCnc: 0.2 AI (ref 0.0–0.9)
ENA SSA (RO) Ab: <0.2 AI (ref 0.0–0.9)
ENA SSB (LA) Ab: <0.2 AI (ref 0.0–0.9)
Scleroderma (Scl-70) (ENA) Antibody, IgG: <0.2 AI (ref 0.0–0.9)
Speckled Pattern: 1:80 {titer}
dsDNA Ab: <1 IU/mL (ref 0–9)

## 2019-02-07 LAB — ANA: ANA Titer 1: POSITIVE — AB

## 2019-02-08 LAB — NOVEL CORONAVIRUS, NAA (HOSP ORDER, SEND-OUT TO REF LAB; TAT 18-24 HRS): SARS-CoV-2, NAA: NOT DETECTED

## 2019-02-10 LAB — SPECIMEN STATUS REPORT

## 2019-02-10 LAB — T3, FREE: T3, Free: 3.5 pg/mL (ref 2.0–4.4)

## 2019-02-10 LAB — T4, FREE: Free T4: 1.17 ng/dL (ref 0.82–1.77)

## 2019-02-11 ENCOUNTER — Ambulatory Visit (HOSPITAL_COMMUNITY)
Admission: RE | Admit: 2019-02-11 | Discharge: 2019-02-11 | Disposition: A | Discharge status: Home / Self Care | Payer: Medicare Other | Source: Ambulatory Visit | Attending: Cardiology | Admitting: Cardiology

## 2019-02-11 ENCOUNTER — Encounter (HOSPITAL_COMMUNITY)
Admission: RE | Disposition: A | Discharge status: Home / Self Care | Payer: Self-pay | Source: Ambulatory Visit | Attending: Cardiology

## 2019-02-11 DIAGNOSIS — I10 Essential (primary) hypertension: Secondary | ICD-10-CM | POA: Diagnosis not present

## 2019-02-11 DIAGNOSIS — I272 Pulmonary hypertension, unspecified: Secondary | ICD-10-CM | POA: Diagnosis not present

## 2019-02-11 DIAGNOSIS — E119 Type 2 diabetes mellitus without complications: Secondary | ICD-10-CM | POA: Insufficient documentation

## 2019-02-11 DIAGNOSIS — Z833 Family history of diabetes mellitus: Secondary | ICD-10-CM | POA: Insufficient documentation

## 2019-02-11 DIAGNOSIS — E7800 Pure hypercholesterolemia, unspecified: Secondary | ICD-10-CM | POA: Diagnosis not present

## 2019-02-11 DIAGNOSIS — Z90710 Acquired absence of both cervix and uterus: Secondary | ICD-10-CM | POA: Insufficient documentation

## 2019-02-11 DIAGNOSIS — R0602 Shortness of breath: Secondary | ICD-10-CM | POA: Diagnosis not present

## 2019-02-11 DIAGNOSIS — Z79899 Other long term (current) drug therapy: Secondary | ICD-10-CM | POA: Insufficient documentation

## 2019-02-11 DIAGNOSIS — Z7982 Long term (current) use of aspirin: Secondary | ICD-10-CM | POA: Insufficient documentation

## 2019-02-11 DIAGNOSIS — Z8249 Family history of ischemic heart disease and other diseases of the circulatory system: Secondary | ICD-10-CM | POA: Diagnosis not present

## 2019-02-11 HISTORY — PX: RIGHT HEART CATH: CATH118263

## 2019-02-11 LAB — POCT I-STAT EG7
Acid-Base Excess: 3 mmol/L — ABNORMAL HIGH (ref 0.0–2.0)
Bicarbonate: 28.1 mmol/L — ABNORMAL HIGH (ref 20.0–28.0)
Calcium, Ion: 1.3 mmol/L (ref 1.15–1.40)
HCT: 36 % (ref 36.0–46.0)
Hemoglobin: 12.2 g/dL (ref 12.0–15.0)
O2 Saturation: 75 %
Potassium: 2.8 mmol/L — ABNORMAL LOW (ref 3.5–5.1)
Sodium: 142 mmol/L (ref 135–145)
TCO2: 29 mmol/L (ref 22–32)
pCO2, Ven: 44.6 mmHg (ref 44.0–60.0)
pH, Ven: 7.408 (ref 7.250–7.430)
pO2, Ven: 40 mmHg (ref 32.0–45.0)

## 2019-02-11 LAB — GLUCOSE, CAPILLARY
Glucose-Capillary: 86 mg/dL (ref 70–99)
Glucose-Capillary: 119 mg/dL — ABNORMAL HIGH (ref 70–99)

## 2019-02-11 SURGERY — RIGHT HEART CATH

## 2019-02-11 MED ORDER — MIDAZOLAM HCL 2 MG/2ML IJ SOLN
INTRAMUSCULAR | Status: DC | PRN
  Administered 2019-02-11: 1 mg via INTRAVENOUS

## 2019-02-11 MED ORDER — ASPIRIN 81 MG PO CHEW: 81.0000 mg | CHEWABLE_TABLET | ORAL | Status: DC

## 2019-02-11 MED ORDER — LIDOCAINE HCL (PF) 1 % IJ SOLN
INTRAMUSCULAR | Status: DC | PRN
  Administered 2019-02-11: 2 mL via INTRADERMAL

## 2019-02-11 MED ORDER — FENTANYL CITRATE (PF) 100 MCG/2ML IJ SOLN
INTRAMUSCULAR | Status: DC | PRN
  Administered 2019-02-11: 25 ug via INTRAVENOUS

## 2019-02-11 MED ORDER — SODIUM CHLORIDE 0.9 % IV SOLN: INTRAVENOUS | Status: DC

## 2019-02-11 MED ORDER — MIDAZOLAM HCL 2 MG/2ML IJ SOLN
INTRAMUSCULAR | Status: AC
  Filled 2019-02-11: qty 2

## 2019-02-11 MED ORDER — FENTANYL CITRATE (PF) 100 MCG/2ML IJ SOLN
INTRAMUSCULAR | Status: AC
  Filled 2019-02-11: qty 2

## 2019-02-11 MED ORDER — HEPARIN (PORCINE) IN NACL 1000-0.9 UT/500ML-% IV SOLN
INTRAVENOUS | Status: AC
  Filled 2019-02-11: qty 500

## 2019-02-11 MED ORDER — SODIUM CHLORIDE 0.9% FLUSH: 3.0000 mL | INTRAVENOUS | Status: DC | PRN

## 2019-02-11 MED ORDER — SODIUM CHLORIDE 0.9% FLUSH: 3.0000 mL | Freq: Two times a day (BID) | INTRAVENOUS | Status: DC

## 2019-02-11 MED ORDER — SODIUM CHLORIDE 0.9 % IV SOLN: 250.0000 mL | INTRAVENOUS | Status: DC | PRN

## 2019-02-11 MED ORDER — HEPARIN (PORCINE) IN NACL 1000-0.9 UT/500ML-% IV SOLN
INTRAVENOUS | Status: DC | PRN
  Administered 2019-02-11: 500 mL

## 2019-02-11 SURGICAL SUPPLY — 6 items
CATH BALLN WEDGE 5F 110CM (CATHETERS) ×2 IMPLANT
PROTECTION STATION PRESSURIZED (MISCELLANEOUS) ×3
SHEATH GLIDE SLENDER 4/5FR (SHEATH) ×2 IMPLANT
STATION PROTECTION PRESSURIZED (MISCELLANEOUS) IMPLANT
TUBING ART PRESS 72  MALE/FEM (TUBING) ×2
TUBING ART PRESS 72 MALE/FEM (TUBING) IMPLANT

## 2019-02-11 NOTE — Progress Notes (Signed)
Discharge instructions reviewed with pt and wife with stated understanding. IV site unremarkable and offers no complaints. VSS. Ready for discharge.

## 2019-02-11 NOTE — Interval H&P Note (Signed)
History and Physical Interval Note:  02/11/2019 7:32 AM  Gloria Murphy  has presented today for surgery, with the diagnosis of hp.  The various methods of treatment have been discussed with the patient and family. After consideration of risks, benefits and other options for treatment, the patient has consented to  Procedure(s): RIGHT HEART CATH (N/A) as a surgical intervention.  The patient's history has been reviewed, patient examined, no change in status, stable for surgery.  I have reviewed the patient's chart and labs.  Questions were answered to the patient's satisfaction.    2012 Appropriate Use Criteria for Diagnostic Catheterization Home / Select Test of Interest Indication for [facility] Pulmonary Hypertension Pulmonary Hypertension (Right Heart Catheterization) Pulmonary Hypertension  (Right Heart Catheterization) Link Here: [email or web address] Indication:  1. Suspected pulmonary hypertension 2. Elevated estimated right ventricular systolic pressure on resting echo study A (7) Indication: 98; Score 7    Gloria Murphy

## 2019-02-11 NOTE — Discharge Instructions (Signed)
Venogram, Care After °This sheet gives you information about how to care for yourself after your procedure. Your health care provider may also give you more specific instructions. If you have problems or questions, contact your health care provider. °What can I expect after the procedure? °After the procedure, it is common to have: °· Bruising or mild discomfort in the area where the IV was inserted (insertion site). °Follow these instructions at home: °Eating and drinking ° °· Follow instructions from your health care provider about eating or drinking restrictions. °· Drink a lot of fluids for the first several days after the procedure, as directed by your health care provider. This helps to wash (flush) the contrast out of your body. Examples of healthy fluids include water or low-calorie drinks. °General instructions °· Check your IV insertion area every day for signs of infection. Check for: °? Redness, swelling, or pain. °? Fluid or blood. °? Warmth. °? Pus or a bad smell. °· Take over-the-counter and prescription medicines only as told by your health care provider. °· Rest and return to your normal activities as told by your health care provider. Ask your health care provider what activities are safe for you. °· Do not drive for 24 hours if you were given a medicine to help you relax (sedative), or until your health care provider approves. °· Keep all follow-up visits as told by your health care provider. This is important. °Contact a health care provider if: °· Your skin becomes itchy or you develop a rash or hives. °· You have a fever that does not get better with medicine. °· You feel nauseous. °· You vomit. °· You have redness, swelling, or pain around the insertion site. °· You have fluid or blood coming from the insertion site. °· Your insertion area feels warm to the touch. °· You have pus or a bad smell coming from the insertion site. °Get help right away if: °· You have difficulty breathing or  shortness of breath. °· You develop chest pain. °· You faint. °· You feel very dizzy. °These symptoms may represent a serious problem that is an emergency. Do not wait to see if the symptoms will go away. Get medical help right away. Call your local emergency services (911 in the [place] Do not drive yourself to the hospital. °Summary °· After your procedure, it is common to have bruising or mild discomfort in the area where the IV was inserted. °· You should check your IV insertion area every day for signs of infection. °· Take over-the-counter and prescription medicines only as told by your health care provider. °· You should drink a lot of fluids for the first several days after the procedure to help flush the contrast from your body. °This information is not intended to replace advice given to you by your health care provider. Make sure you discuss any questions you have with your health care provider. °Document Released: 02/13/2013 Document Revised: 04/07/2017 Document Reviewed: 03/19/2016 °Elsevier Patient Education © 2020 Elsevier Inc. ° °

## 2019-02-12 ENCOUNTER — Encounter (HOSPITAL_COMMUNITY): Payer: Self-pay | Admitting: Cardiology

## 2019-02-12 LAB — POCT I-STAT EG7
Acid-Base Excess: 3 mmol/L — ABNORMAL HIGH (ref 0.0–2.0)
Acid-Base Excess: 4 mmol/L — ABNORMAL HIGH (ref 0.0–2.0)
Bicarbonate: 28.8 mmol/L — ABNORMAL HIGH (ref 20.0–28.0)
Bicarbonate: 29.5 mmol/L — ABNORMAL HIGH (ref 20.0–28.0)
Calcium, Ion: 1.32 mmol/L (ref 1.15–1.40)
Calcium, Ion: 1.32 mmol/L (ref 1.15–1.40)
HCT: 36 % (ref 36.0–46.0)
HCT: 37 % (ref 36.0–46.0)
Hemoglobin: 12.2 g/dL (ref 12.0–15.0)
Hemoglobin: 12.6 g/dL (ref 12.0–15.0)
O2 Saturation: 63 %
O2 Saturation: 74 %
Potassium: 2.9 mmol/L — ABNORMAL LOW (ref 3.5–5.1)
Potassium: 3.1 mmol/L — ABNORMAL LOW (ref 3.5–5.1)
Sodium: 141 mmol/L (ref 135–145)
Sodium: 142 mmol/L (ref 135–145)
TCO2: 30 mmol/L (ref 22–32)
TCO2: 31 mmol/L (ref 22–32)
pCO2, Ven: 46.4 mmHg (ref 44.0–60.0)
pCO2, Ven: 47.6 mmHg (ref 44.0–60.0)
pH, Ven: 7.4 (ref 7.250–7.430)
pH, Ven: 7.401 (ref 7.250–7.430)
pO2, Ven: 33 mmHg (ref 32.0–45.0)
pO2, Ven: 40 mmHg (ref 32.0–45.0)

## 2019-02-13 ENCOUNTER — Other Ambulatory Visit: Payer: Self-pay | Admitting: Cardiology

## 2019-02-13 DIAGNOSIS — E876 Hypokalemia: Secondary | ICD-10-CM

## 2019-02-13 NOTE — Progress Notes (Signed)
Labs checked in the hospital showed low potassium. Please repeat BMP at [place].  [place]

## 2019-02-13 NOTE — Progress Notes (Signed)
CALLED PT ASKED TO CALL BACK, NEEDS TO HAVE POTASSIUM RE CHEKED

## 2019-02-22 ENCOUNTER — Encounter: Payer: Self-pay | Admitting: Cardiology

## 2019-02-22 ENCOUNTER — Other Ambulatory Visit: Payer: Self-pay

## 2019-02-22 ENCOUNTER — Ambulatory Visit: Payer: Medicare Other | Admitting: Cardiology

## 2019-02-22 VITALS — BP 147/72 | HR 89 | Temp 96.7°F | Ht 69.0 in | Wt 179.0 lb

## 2019-02-22 DIAGNOSIS — R0600 Dyspnea, unspecified: Secondary | ICD-10-CM

## 2019-02-22 DIAGNOSIS — I10 Essential (primary) hypertension: Secondary | ICD-10-CM

## 2019-02-22 DIAGNOSIS — E876 Hypokalemia: Secondary | ICD-10-CM | POA: Diagnosis not present

## 2019-02-22 DIAGNOSIS — R0609 Other forms of dyspnea: Secondary | ICD-10-CM

## 2019-02-22 NOTE — Progress Notes (Signed)
Follow up visit  Subjective:   Gloria Murphy, female    DOB: August 08, 1935, 83 y.o.   MRN: [id number]   Chief Complaint  Patient presents with  . Shortness of Breath  . Follow-up    7-10 day    HPI  83 year old African-American female with hypertension, hyperlipidemia, type 2 diabetes mellitus, history of ovarian carcinoid tumor, initially seen for symptoms of angina, and acute visual changes.  Cardiac work-up showed no evidence of ischemia or infarction on nuclear stress test.  Echocardiogram showed normal EF, grade 1 diastolic dysfunction, mild left atrial dilatation, moderate tricuspid regurgitation with moderate pulmonary hypertension, PASP 56 mmhg. 6-minute walk test showed distance of 292 m, O2 saturation dropped to as low as 79%, recovered to 92% after rest.However, right heart catheterization did not showed normal pulmonary artery systolic pressure. She continues to have exertional dyspnea.   Patient is being seen by neurologist Dr. Krista Blue for her visual changes.  Given her elevated ESR and positive ANA, and there was concern for temporal arteritis.  MRI of the brain did not show any acute stroke findings.  It did show minimal chronic microvascular ischemic changes typical for age.  MR angiogram showed moderate bilateral moderate carotid stenosis.   However, right heart catheterization showed normal pulmonary artery pressures, detaild below.    Past Medical History:  Diagnosis Date  . Diabetes mellitus without complication ([place])   . High cholesterol   . Hypertension     Past Surgical History:  Procedure Laterality Date  . ABDOMINAL HYSTERECTOMY    . EYE SURGERY Bilateral 06/2016   Cataract  . KNEE SURGERY Right   . LAPAROTOMY N/A 08/31/2017   Procedure: EXPLORATORY LAPAROTOMY; BILATERAL SALPINGOOPHERECTOMY; OMENTECTOMY, TUMOR DEBULKING;  Surgeon: Everitt Amber, MD;  Location: [facility];  Service: Gynecology;  Laterality: N/A;  . MASS EXCISION N/A 08/31/2017   Procedure:  RESECTION OF PELVIC MASS;  Surgeon: Everitt Amber, MD;  Location: [facility];  Service: Gynecology;  Laterality: N/A;  . RIGHT HEART CATH N/A 02/11/2019   Procedure: RIGHT HEART CATH;  Surgeon: Nigel Mormon, MD;  Location: [place] [facility];  Service: Cardiovascular;  Laterality: N/A;    Social History   Socioeconomic History  . Marital status: Married    Spouse name: Not on file  . Number of children: 3  . Years of education: 39  . Highest education level: High school graduate  Occupational History  . Occupation: Retired  Scientific laboratory technician  . Financial resource strain: Not on file  . Food insecurity    Worry: Not on file    Inability: Not on file  . Transportation needs    Medical: Not on file    Non-medical: Not on file  Tobacco Use  . Smoking status: Never Smoker  . Smokeless tobacco: Never Used  Substance and Sexual Activity  . Alcohol use: No  . Drug use: No  . Sexual activity: Not on file  Lifestyle  . Physical activity    Days per week: Not on file    Minutes per session: Not on file  . Stress: Not on file  Relationships  . Social Herbalist on phone: Not on file    Gets together: Not on file    Attends religious service: Not on file    Active member of club or organization: Not on file    Attends meetings of clubs or organizations: Not on file    Relationship status: Not on file  .  Intimate partner violence    Fear of current or ex partner: Not on file    Emotionally abused: Not on file    Physically abused: Not on file    Forced sexual activity: Not on file  Other Topics Concern  . Not on file  Social History Narrative   Lives at home with her husband.   Right-handed.   Occasional caffeine.    Family History  Problem Relation Age of Onset  . Heart disease Mother   . Diabetes Mother   . Kidney failure Father   . Breast cancer Neg Hx     Current Outpatient Medications on File Prior to Visit  Medication Sig Dispense Refill  . aspirin EC  81 MG tablet Take 1 tablet (81 mg total) by mouth daily. 90 tablet 3  . Calcium Carbonate (CALTRATE 600 PO) Take 1 tablet by mouth daily.    . carboxymethylcellulose (REFRESH TEARS) 0.5 % SOLN Place 1 drop into both eyes 3 (three) times daily.    . methimazole (TAPAZOLE) 5 MG tablet Take 5 mg by mouth daily.    [name] omeprazole (PRILOSEC) 20 MG capsule Take 1 capsule (20 mg total) by mouth daily. 30 capsule 0   No current facility-administered medications on file prior to visit.     Cardiovascular studies:  EKG 02/22/2019: Sinus rhythm 87 bpm. Normal EKG.  Right heart cath 02/11/2019:  Condition#1 (At rest): RA: 0 mmHg RV: 21/0 mmHg PA: 20/3 mmHg, mean PAP 11 mmHg PCWP: 1 mmHg  PA sats 75% CO 6.4 L/min, CI 3.3 L/min/m2  Condition#2 (5 min isometric exercise): PA: 22/3 mmHg, mean PAP 10 mmHg CO 4.5 L/min, CI 2.3 L/min/m2  Condition#3 (5 min rest after isometric exercise): PA sats 74% CO 6.6 L/min, CI 3.4 L/min/m2  No increase in PA pressures after 5 min isometric exercise.  Unclear why her CO was lower with exercise.  Limitations: Patient's O2 sats remained 100% throughout the case. 5 min isometric exercise was likely inadequate to exhibit increase in pulmonary pressures, as patient's O2 sat did drop to 79% with 6 min walk test.  Impression: Normal resting pulmonary pressures by direct right heart cath measurement. No significant increase in pulmonary pressures with low intensity isometric exercise.   Recommendation: Pulmonary workup for dyspnea on exertion.  Lexiscan Myoview Stress Test07/05/2018: Stress EKG is non-diagnostic, as this is pharmacological stress test. Occasional PVC noted through out the stress test.  Myocardial pefusion imaging is normal. Left ventricular ejection fraction is 72% with normal wall motion.  Low risk study.  Echocardiogram 12/18/2018: Left ventricle cavity is normal in size. Normal left ventricular wall thickness. Normal global  wall motion. Normal LV systolic function with EF 69%. Doppler evidence of grade I (impaired) diastolic dysfunction, normal LAP.  Left atrial cavity is mildly dilated. Moderate tricuspid regurgitation. Moderate pulmonary hypertension. Estimated pulmonary artery systolic pressure is 56 mmHg.   Recent labs: Results for Gloria, Murphy (MRN [id number]) as of 02/22/2019 13:56  Ref. Range 12/19/2018 15:30 02/04/2019 17:19  SEE BELOW Unknown Comment Comment  Anti Nuclear Antibody (ANA) Latest Ref Range: Negative  Positive (A)   ANA Titer 1 Unknown  Positive (A)  Anti JO-1 Latest Ref Range: 0.0 - 0.9 AI <0.2 <0.2  CENTROMERE AB SCREEN Latest Ref Range: 0.0 - 0.9 AI <0.2 <0.2  dsDNA Ab Latest Ref Range: 0 - 9 IU/mL <1 <1  ENA RNP Ab Latest Ref Range: 0.0 - 0.9 AI 6.4 (H) 6.2 (H)  ENA SSA (RO) Ab  Latest Ref Range: 0.0 - 0.9 AI <0.2 <0.2  ENA SSB (LA) Ab Latest Ref Range: 0.0 - 0.9 AI <0.2 <0.2  ENA SM Ab Ser-aCnc Latest Ref Range: 0.0 - 0.9 AI <0.2 0.2  Chromatin Ab SerPl-aCnc Latest Ref Range: 0.0 - 0.9 AI 0.3 0.4  Speckled Pattern Unknown  1:80  NOTE: Unknown  Comment  Scleroderma (Scl-70) (ENA) Antibody, IgG Latest Ref Range: 0.0 - 0.9 AI <0.2 <0.2     Review of Systems  Constitution: Positive for weight loss. Negative for decreased appetite, malaise/fatigue and weight gain.  HENT: Negative for congestion.   Eyes: Negative for visual disturbance.  Cardiovascular: Positive for dyspnea on exertion and leg swelling. Negative for chest pain, palpitations and syncope.  Respiratory: Negative for cough.   Endocrine: Negative for cold intolerance.  Hematologic/Lymphatic: Does not bruise/bleed easily.  Skin: Negative for itching and rash.  Musculoskeletal: Negative for myalgias.  Gastrointestinal: Negative for abdominal pain, nausea and vomiting.  Genitourinary: Negative for dysuria.  Neurological: Negative for dizziness and weakness.  Psychiatric/Behavioral: The patient is not nervous/anxious.    All other systems reviewed and are negative.        Vitals:   02/22/19 1346  BP: (!) 147/72  Pulse: 89  Temp: (!) 96.7 F (35.9 C)  SpO2: 96%     Body mass index is 26.43 kg/m. Filed Weights   02/22/19 1346  Weight: 179 lb (81.2 kg)    Objective:   Physical Exam  Constitutional: She is oriented to person, place, and time. She appears well-developed and well-nourished. No distress.  HENT:  Head: Normocephalic and atraumatic.  Eyes: Pupils are equal, round, and reactive to light. Conjunctivae are normal.  Neck: No JVD present.  Cardiovascular: Normal rate, regular rhythm and intact distal pulses.  Pulmonary/Chest: Effort normal and breath sounds normal. She has no wheezes. She has no rales.  Abdominal: Soft. Bowel sounds are normal. There is no rebound.  Musculoskeletal:        General: Edema (Trace) present.  Lymphadenopathy:    She has no cervical adenopathy.  Neurological: She is alert and oriented to person, place, and time. No cranial nerve deficit.  Skin: Skin is warm and dry.  Psychiatric: She has a normal mood and affect.  Nursing note and vitals reviewed.         Assessment & Recommendations:   83 year old African-American female with hypertension, hyperlipidemia, type 2 diabetes mellitus, history of ovarian carcinoid tumor, with exertional dyspnea:  Exertional dyspnea: While Echocardiogram suggested possibility pf pulmonary hypertension, direct measurement of pulmonary pressures with right heart catheterization did not confirm this. I still suspect she could have connective tissue disorder, given her elevated ESR, ENA. Recommend chest Xray and referral to pulmonology.  Hypertension: She is currently not taking any antihypertensive medications. BP mildly elevated. Recommend repeat BP check with Dr. Criss Rosales at follow up.  Vision changes: Etiology remains unclear. Visual changes have resolved.  She has follow up with Neurology.    Nigel Mormon,  MD [facility]. [place] Pager: [phone] Office: [phone] If no answer Cell [phone]

## 2019-02-22 NOTE — Patient Instructions (Signed)
Heart catheterization showed that you do not have pulmonary hypertension (increase pressure on right side of the heart). Recommend seeing lung doctors for your shortness of breath. Will check chest Xray and blood work. Please follow up with Dr. Gwinda Maine and Dr. Krista Blue. I will se you on as needed basis.

## 2019-02-26 ENCOUNTER — Other Ambulatory Visit (HOSPITAL_COMMUNITY): Payer: Self-pay | Admitting: Cardiology

## 2019-02-26 ENCOUNTER — Encounter: Payer: Self-pay | Admitting: Neurology

## 2019-02-26 ENCOUNTER — Other Ambulatory Visit: Payer: Self-pay

## 2019-02-26 ENCOUNTER — Ambulatory Visit: Payer: Medicare Other | Admitting: Neurology

## 2019-02-26 VITALS — BP 162/80 | HR 70 | Temp 98.2°F | Ht 69.0 in | Wt 170.5 lb

## 2019-02-26 DIAGNOSIS — H539 Unspecified visual disturbance: Secondary | ICD-10-CM | POA: Diagnosis not present

## 2019-02-26 DIAGNOSIS — E876 Hypokalemia: Secondary | ICD-10-CM | POA: Diagnosis not present

## 2019-02-26 NOTE — Progress Notes (Signed)
PATIENT: Gloria Murphy DOB: 07/10/1935  Chief Complaint  Patient presents with  . Decreased Visual Acuity    Vision disturbances are unchanged. Reports mild headaches twice weekly that resolve with ibuprofen. She is here to further review her MRI brain, MRA brain/neck, ECHO and lab results.  She confirms she is taking aspirin '81mg'$  daily.      HISTORICAL  Gloria Murphy is a 83 years old female, seen in request by her primary care physician Dr. Criss Rosales, Myra Rude, for evaluation of sudden visual change, initial evaluation was on December 19, 2018.  I have reviewed and summarized the referring note from the referring physician.  I also reviewed ophthalmologist Dr. Payton Emerald evaluation on October 23, 2018.  She had a past medical history of hypertension, hyperlipidemia, diabetes, she is a poor historian, per record, she was seen for acute decreased vision in both eye since early June 2020, best corrected visual acuity right eye 20/160, 20/60 left eye, this was a reduction from 20/20 in both eyes in March 2020, her extraocular muscle testing was normal, confrontational visual field testing appeared should before, funduscopy examination after dilation, right optic nerve appeared to be swollen with a possible infarction on the superior aspect of the nerve, left optic nerve, well perfused, healthy, distinct without evidence of edema.  There was also mild to moderate hypertensive changes to her retinal vasculature, and all other retinal findings were normal, visual field testing revealed inferior defect consistent with the nerve appearance in both eye.  Patient reported 2 months ago in early June 2020, she woke up 1 night using bathroom, noticed sudden onset bilateral blurry vision, color distortion, she can still see the object, but was overlaid with rapid change color shapes, she was able to finish using bathroom, went back to sleep again, next day, when she woke up, she noticed the blurry vision, she  described right nasal inferior field deficit, which has been persistent since then, she was put on aspirin 81 mg daily,  Laboratory evaluations in June 2020, LDL was 136, normal CMP, creatinine of 0.94, CBC hemoglobin of 11.2, normal thyroid functional test, A1c was 5.4, TSH was decreased 0.03,  UPDATE Feb 26 2019: She continue has mild right side blurry vision, dark spot in the right lower visual field, ophthalmology appointment pending  I personally reviewed MRI of the brain without contrast January 25, 2019: T2/FLAIR hyperdensity supratentorium small vessel disease. MRA of brain showed no large vessel disease  MRA of neck 50 to 60% stenosis of right internal carotid artery, 60-70% of left internal carotid artery  ECHO showed EF 69%, mildly dilated left atrial.  Laboratory evaluation showed normal ESR , C-reactive protein, positive ANA, ENA antibody, normal RPR, B12, TSH was significantly decreased 0.024,  REVIEW OF SYSTEMS: Full 14 system review of systems performed and notable only for as above All other review of systems were negative.  ALLERGIES: No Known Allergies  HOME MEDICATIONS: Current Outpatient Medications  Medication Sig Dispense Refill  . aspirin EC 81 MG tablet Take 1 tablet (81 mg total) by mouth daily. 90 tablet 3  . Calcium Carbonate (CALTRATE 600 PO) Take 1 tablet by mouth daily.    . carboxymethylcellulose (REFRESH TEARS) 0.5 % SOLN Place 1 drop into both eyes 3 (three) times daily.    . methimazole (TAPAZOLE) 5 MG tablet Take 5 mg by mouth daily.    [name] omeprazole (PRILOSEC) 20 MG capsule Take 1 capsule (20 mg total) by mouth daily. 30 capsule 0  No current facility-administered medications for this visit.     PAST MEDICAL HISTORY: Past Medical History:  Diagnosis Date  . Diabetes mellitus without complication ([place])   . High cholesterol   . Hypertension   . Thyroid disease     PAST SURGICAL HISTORY: Past Surgical History:  Procedure Laterality Date   . ABDOMINAL HYSTERECTOMY    . EYE SURGERY Bilateral 06/2016   Cataract  . KNEE SURGERY Right   . LAPAROTOMY N/A 08/31/2017   Procedure: EXPLORATORY LAPAROTOMY; BILATERAL SALPINGOOPHERECTOMY; OMENTECTOMY, TUMOR DEBULKING;  Surgeon: Everitt Amber, MD;  Location: [facility];  Service: Gynecology;  Laterality: N/A;  . MASS EXCISION N/A 08/31/2017   Procedure: RESECTION OF PELVIC MASS;  Surgeon: Everitt Amber, MD;  Location: [facility];  Service: Gynecology;  Laterality: N/A;  . RIGHT HEART CATH N/A 02/11/2019   Procedure: RIGHT HEART CATH;  Surgeon: Nigel Mormon, MD;  Location: [place] [facility];  Service: Cardiovascular;  Laterality: N/A;    FAMILY HISTORY: Family History  Problem Relation Age of Onset  . Heart disease Mother   . Diabetes Mother   . Kidney failure Father   . Breast cancer Neg Hx     SOCIAL HISTORY: Social History   Socioeconomic History  . Marital status: Married    Spouse name: Not on file  . Number of children: 3  . Years of education: 5  . Highest education level: High school graduate  Occupational History  . Occupation: Retired  Scientific laboratory technician  . Financial resource strain: Not on file  . Food insecurity    Worry: Not on file    Inability: Not on file  . Transportation needs    Medical: Not on file    Non-medical: Not on file  Tobacco Use  . Smoking status: Never Smoker  . Smokeless tobacco: Never Used  Substance and Sexual Activity  . Alcohol use: No  . Drug use: No  . Sexual activity: Not on file  Lifestyle  . Physical activity    Days per week: Not on file    Minutes per session: Not on file  . Stress: Not on file  Relationships  . Social Herbalist on phone: Not on file    Gets together: Not on file    Attends religious service: Not on file    Active member of club or organization: Not on file    Attends meetings of clubs or organizations: Not on file    Relationship status: Not on file  . Intimate partner violence    Fear of  current or ex partner: Not on file    Emotionally abused: Not on file    Physically abused: Not on file    Forced sexual activity: Not on file  Other Topics Concern  . Not on file  Social History Narrative   Lives at home with her husband.   Right-handed.   Occasional caffeine.     PHYSICAL EXAM   Vitals:   02/26/19 1451  BP: (!) 162/80  Pulse: 70  Temp: 98.2 F (36.8 C)  Weight: 170 lb 8 oz (77.3 kg)  Height: '5\' 9"'$  (1.753 m)    Not recorded      Body mass index is 25.18 kg/m.  PHYSICAL EXAMNIATION:  Gen: NAD, conversant, well nourised, obese, well groomed                     Cardiovascular: Regular rate rhythm, no peripheral edema, warm, nontender.  Eyes: Conjunctivae clear without exudates or hemorrhage Neck: Supple, no carotid bruits. Pulmonary: Clear to auscultation bilaterally   NEUROLOGICAL EXAM:  MENTAL STATUS: Speech:    Speech is normal; fluent and spontaneous with normal comprehension.  Cognition:     Orientation to time, place and person     Normal recent and remote memory     Normal Attention span and concentration     Normal Language, naming, repeating,spontaneous speech     Fund of knowledge   CRANIAL NERVES: CN II: Visual fields are full to confrontation. Pupils are round equal and briskly reactive to light. CN III, IV, VI: extraocular movement are normal. No ptosis. CN V: Facial sensation is intact to pinprick in all 3 divisions bilaterally. Corneal responses are intact.  CN VII: Face is symmetric with normal eye closure and smile. CN VIII: Hearing is normal to rubbing fingers CN IX, X: Palate elevates symmetrically. Phonation is normal. CN XI: Head turning and shoulder shrug are intact CN XII: Tongue is midline with normal movements and no atrophy.  MOTOR: There is no pronator drift of out-stretched arms. Muscle bulk and tone are normal. Muscle strength is normal.  REFLEXES: Reflexes are 2+ and symmetric at the biceps, triceps, knees,  and ankles. Plantar responses are flexor.  SENSORY: Intact to light touch, pinprick, positional sensation and vibratory sensation are intact in fingers and toes.  COORDINATION: Rapid alternating movements and fine finger movements are intact. There is no dysmetria on finger-to-nose and heel-knee-shin.    GAIT/STANCE: Posture is normal. Gait is steady with normal steps, base, arm swing, and turning.    DIAGNOSTIC DATA (LABS, IMAGING, TESTING) - I reviewed patient records, labs, notes, testing and imaging myself where available.   ASSESSMENT AND PLAN  AMENAH TUCCI is a 83 y.o. 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  MRA of brain and neck: Left internal carotid artery 60 to 70% stenosis, right 50 to 60% stenosis  Continue aspirin 81 mg daily     Marcial Pacas, M.D. Ph.D.  [facility] [address] [place], [place] [address] Ph: [phone] Fax: [phone]  CC: Lucianne Lei, MD

## 2019-02-27 ENCOUNTER — Telehealth: Payer: Self-pay

## 2019-02-27 LAB — BASIC METABOLIC PANEL
BUN/Creatinine Ratio: 19 (ref 12–28)
BUN: 19 mg/dL (ref 8–27)
CO2: 25 mmol/L (ref 20–29)
Calcium: 9.7 mg/dL (ref 8.7–10.3)
Chloride: 107 mmol/L — ABNORMAL HIGH (ref 96–106)
Creatinine, Ser: 0.98 mg/dL (ref 0.57–1.00)
GFR calc Af Amer: 63 mL/min/{1.73_m2} (ref >59)
GFR calc non Af Amer: 54 mL/min/{1.73_m2} — ABNORMAL LOW (ref >59)
Glucose: 99 mg/dL (ref 65–99)
Potassium: 3.6 mmol/L (ref 3.5–5.2)
Sodium: 142 mmol/L (ref 134–144)

## 2019-02-27 NOTE — Telephone Encounter (Signed)
-----   Message from Nigel Mormon, MD sent at 02/22/2019  1:51 PM EDT ----- Regarding: TOC Discharge follow up: TOC: Needed Follow up appt: Scheduled Discharge diagnosis: Pericarditis Discharge date: 02/22/2019  Thanks MJP

## 2019-02-27 NOTE — Progress Notes (Signed)
Called pt no answer, left a vm to call back

## 2019-02-27 NOTE — Telephone Encounter (Signed)
Called pt left vm for her to call back.

## 2019-02-27 NOTE — Telephone Encounter (Signed)
Awaiting call back.

## 2019-03-01 NOTE — Progress Notes (Signed)
Called pt no answer, left a vm

## 2019-03-07 DIAGNOSIS — E0580 Other thyrotoxicosis without thyrotoxic crisis or storm: Secondary | ICD-10-CM | POA: Diagnosis not present

## 2019-03-07 DIAGNOSIS — I10 Essential (primary) hypertension: Secondary | ICD-10-CM | POA: Diagnosis not present

## 2019-03-07 DIAGNOSIS — I69398 Other sequelae of cerebral infarction: Secondary | ICD-10-CM | POA: Diagnosis not present

## 2019-03-15 NOTE — Progress Notes (Signed)
Called pt no answer, left a vm

## 2019-03-20 ENCOUNTER — Other Ambulatory Visit: Payer: Self-pay

## 2019-03-20 NOTE — Patient Outreach (Signed)
[facility] [facility]) Care Management  03/20/2019  NORELL HENNINGSON [birth year] [id number]   Medication Adherence call to Mrs. Bennye Alm HIPPA Compliant Voice message left with a call back number. Mrs. Deline is showing past due on Pioglitazone 15 mg and Glimepiride 1 mg under [place].    [place] Management Direct Dial [phone]  Fax [phone] [email or web address]

## 2019-03-28 DIAGNOSIS — I69398 Other sequelae of cerebral infarction: Secondary | ICD-10-CM | POA: Diagnosis not present

## 2019-03-28 DIAGNOSIS — E0580 Other thyrotoxicosis without thyrotoxic crisis or storm: Secondary | ICD-10-CM | POA: Diagnosis not present

## 2019-03-28 DIAGNOSIS — I10 Essential (primary) hypertension: Secondary | ICD-10-CM | POA: Diagnosis not present

## 2019-03-29 ENCOUNTER — Other Ambulatory Visit: Payer: Self-pay

## 2019-03-29 ENCOUNTER — Encounter: Payer: Self-pay | Admitting: Cardiology

## 2019-03-29 ENCOUNTER — Ambulatory Visit: Payer: Medicare Other | Admitting: Cardiology

## 2019-03-29 VITALS — BP 142/82 | HR 69 | Temp 99.2°F | Ht 69.0 in | Wt 168.0 lb

## 2019-03-29 DIAGNOSIS — R0600 Dyspnea, unspecified: Secondary | ICD-10-CM | POA: Diagnosis not present

## 2019-03-29 DIAGNOSIS — I10 Essential (primary) hypertension: Secondary | ICD-10-CM

## 2019-03-29 DIAGNOSIS — R0609 Other forms of dyspnea: Secondary | ICD-10-CM

## 2019-03-29 NOTE — Progress Notes (Signed)
   Follow up visit  Subjective:   Gloria Murphy, female    DOB: 1935/09/22, 83 y.o.   MRN: [id number]   Chief Complaint  Patient presents with  . Shortness of Breath  . Follow-up   83 year old African-American female with hypertension, hyperlipidemia, type 2 diabetes mellitus, history of ovarian carcinoid tumor.  Exertional dyspnea has resolved. BP elevated, improved on subsequent checks.   Today's Vitals   03/29/19 1338 03/29/19 1342 03/29/19 1406  BP: (!) 158/68 (!) 168/75 (!) 142/82  Pulse: 87 82 69  Temp: 99.2 F (37.3 C)    SpO2: 99%    Weight: 168 lb (76.2 kg)    Height: 5\' 9"  (1.753 m)     Body mass index is 24.81 kg/m.   Physical Exam  Constitutional: She is oriented to person, place, and time. She appears well-developed and well-nourished. No distress.  HENT:  Head: Normocephalic and atraumatic.  Eyes: Pupils are equal, round, and reactive to light. Conjunctivae are normal.  Neck: No JVD present.  Cardiovascular: Normal rate, regular rhythm and intact distal pulses.  No murmur heard. Pulmonary/Chest: Effort normal and breath sounds normal. She has no wheezes. She has no rales.  Abdominal: Soft. Bowel sounds are normal. There is no rebound.  Musculoskeletal:        General: No edema.  Lymphadenopathy:    She has no cervical adenopathy.  Neurological: She is alert and oriented to person, place, and time. No cranial nerve deficit.  Skin: Skin is warm and dry.  Psychiatric: She has a normal mood and affect.  Nursing note and vitals reviewed.    Continue f/u w/PCP. I will see her on as needed basis.   Nigel Mormon, MD [facility] Pager: [phone] Office: [phone] If no answer Cell [phone]

## 2019-04-10 NOTE — Progress Notes (Signed)
Called patient on home number, NA, no VM to leave a message. Called cell number, disconnected.

## 2019-04-12 DIAGNOSIS — H401131 Primary open-angle glaucoma, bilateral, mild stage: Secondary | ICD-10-CM | POA: Diagnosis not present

## 2019-04-12 DIAGNOSIS — H47011 Ischemic optic neuropathy, right eye: Secondary | ICD-10-CM | POA: Diagnosis not present

## 2019-04-12 DIAGNOSIS — H524 Presbyopia: Secondary | ICD-10-CM | POA: Diagnosis not present

## 2019-04-12 DIAGNOSIS — H04123 Dry eye syndrome of bilateral lacrimal glands: Secondary | ICD-10-CM | POA: Diagnosis not present

## 2019-04-12 DIAGNOSIS — E119 Type 2 diabetes mellitus without complications: Secondary | ICD-10-CM | POA: Diagnosis not present

## 2019-05-04 ENCOUNTER — Other Ambulatory Visit: Payer: Self-pay | Admitting: Cardiology

## 2019-05-04 DIAGNOSIS — R0609 Other forms of dyspnea: Secondary | ICD-10-CM

## 2019-05-22 DIAGNOSIS — I10 Essential (primary) hypertension: Secondary | ICD-10-CM | POA: Diagnosis not present

## 2019-05-22 DIAGNOSIS — E0580 Other thyrotoxicosis without thyrotoxic crisis or storm: Secondary | ICD-10-CM | POA: Diagnosis not present

## 2019-05-22 DIAGNOSIS — E039 Hypothyroidism, unspecified: Secondary | ICD-10-CM | POA: Diagnosis not present

## 2019-05-22 DIAGNOSIS — R7302 Impaired glucose tolerance (oral): Secondary | ICD-10-CM | POA: Diagnosis not present

## 2019-05-22 DIAGNOSIS — E785 Hyperlipidemia, unspecified: Secondary | ICD-10-CM | POA: Diagnosis not present

## 2019-05-29 DIAGNOSIS — E0500 Thyrotoxicosis with diffuse goiter without thyrotoxic crisis or storm: Secondary | ICD-10-CM | POA: Diagnosis not present

## 2019-05-29 DIAGNOSIS — R413 Other amnesia: Secondary | ICD-10-CM | POA: Diagnosis not present

## 2019-05-29 DIAGNOSIS — I69398 Other sequelae of cerebral infarction: Secondary | ICD-10-CM | POA: Diagnosis not present

## 2019-06-06 DIAGNOSIS — E039 Hypothyroidism, unspecified: Secondary | ICD-10-CM | POA: Diagnosis not present

## 2019-06-06 DIAGNOSIS — E1169 Type 2 diabetes mellitus with other specified complication: Secondary | ICD-10-CM | POA: Diagnosis not present

## 2019-06-06 DIAGNOSIS — R739 Hyperglycemia, unspecified: Secondary | ICD-10-CM | POA: Diagnosis not present

## 2019-06-17 ENCOUNTER — Ambulatory Visit: Payer: Medicare Other | Attending: Internal Medicine

## 2019-06-17 DIAGNOSIS — Z23 Encounter for immunization: Secondary | ICD-10-CM | POA: Insufficient documentation

## 2019-06-17 NOTE — Progress Notes (Signed)
   Covid-19 Vaccination Clinic  Name:  Gloria Murphy    MRN: [id number] DOB: [birth year]  06/17/2019  [name] was observed post Covid-19 immunization for 15 minutes without incidence. She was provided with Vaccine Information Sheet and instruction to access the V-Safe system.   [name] was instructed to call 911 with any severe reactions post vaccine: [name] Difficulty breathing  . Swelling of your face and throat  . A fast heartbeat  . A bad rash all over your body  . Dizziness and weakness    Immunizations Administered    Name Date Dose VIS Date Route   Pfizer COVID-19 Vaccine 06/17/2019 12:42 PM 0.3 mL 04/19/2019 Intramuscular   Manufacturer: [place]   Lot: [id number]   [place]: [id number]

## 2019-06-24 ENCOUNTER — Ambulatory Visit (HOSPITAL_COMMUNITY)
Admission: EM | Admit: 2019-06-24 | Discharge: 2019-06-24 | Disposition: A | Discharge status: Home / Self Care | Payer: Medicare Other | Attending: Family Medicine | Admitting: Family Medicine

## 2019-06-24 ENCOUNTER — Encounter (HOSPITAL_COMMUNITY): Payer: Self-pay

## 2019-06-24 ENCOUNTER — Other Ambulatory Visit: Payer: Self-pay

## 2019-06-24 DIAGNOSIS — Z515 Encounter for palliative care: Secondary | ICD-10-CM | POA: Diagnosis not present

## 2019-06-24 DIAGNOSIS — R1312 Dysphagia, oropharyngeal phase: Secondary | ICD-10-CM | POA: Diagnosis not present

## 2019-06-24 DIAGNOSIS — F05 Delirium due to known physiological condition: Secondary | ICD-10-CM | POA: Diagnosis not present

## 2019-06-24 DIAGNOSIS — R627 Adult failure to thrive: Secondary | ICD-10-CM | POA: Diagnosis not present

## 2019-06-24 DIAGNOSIS — R2689 Other abnormalities of gait and mobility: Secondary | ICD-10-CM | POA: Diagnosis not present

## 2019-06-24 DIAGNOSIS — G9341 Metabolic encephalopathy: Secondary | ICD-10-CM | POA: Diagnosis not present

## 2019-06-24 DIAGNOSIS — C7A00 Malignant carcinoid tumor of unspecified site: Secondary | ICD-10-CM | POA: Diagnosis not present

## 2019-06-24 DIAGNOSIS — E872 Acidosis: Secondary | ICD-10-CM | POA: Diagnosis not present

## 2019-06-24 DIAGNOSIS — R140 Abdominal distension (gaseous): Secondary | ICD-10-CM | POA: Diagnosis not present

## 2019-06-24 DIAGNOSIS — R300 Dysuria: Secondary | ICD-10-CM | POA: Diagnosis not present

## 2019-06-24 DIAGNOSIS — J479 Bronchiectasis, uncomplicated: Secondary | ICD-10-CM | POA: Diagnosis not present

## 2019-06-24 DIAGNOSIS — Z7189 Other specified counseling: Secondary | ICD-10-CM | POA: Diagnosis not present

## 2019-06-24 DIAGNOSIS — Z4659 Encounter for fitting and adjustment of other gastrointestinal appliance and device: Secondary | ICD-10-CM | POA: Diagnosis not present

## 2019-06-24 DIAGNOSIS — R1111 Vomiting without nausea: Secondary | ICD-10-CM | POA: Diagnosis not present

## 2019-06-24 DIAGNOSIS — R410 Disorientation, unspecified: Secondary | ICD-10-CM | POA: Diagnosis not present

## 2019-06-24 DIAGNOSIS — R000 Tachycardia, unspecified: Secondary | ICD-10-CM | POA: Diagnosis not present

## 2019-06-24 DIAGNOSIS — K3189 Other diseases of stomach and duodenum: Secondary | ICD-10-CM | POA: Diagnosis not present

## 2019-06-24 DIAGNOSIS — G459 Transient cerebral ischemic attack, unspecified: Secondary | ICD-10-CM | POA: Diagnosis not present

## 2019-06-24 DIAGNOSIS — E871 Hypo-osmolality and hyponatremia: Secondary | ICD-10-CM | POA: Diagnosis not present

## 2019-06-24 DIAGNOSIS — K6389 Other specified diseases of intestine: Secondary | ICD-10-CM | POA: Diagnosis not present

## 2019-06-24 DIAGNOSIS — E1165 Type 2 diabetes mellitus with hyperglycemia: Secondary | ICD-10-CM | POA: Diagnosis not present

## 2019-06-24 DIAGNOSIS — J9600 Acute respiratory failure, unspecified whether with hypoxia or hypercapnia: Secondary | ICD-10-CM | POA: Diagnosis not present

## 2019-06-24 DIAGNOSIS — E876 Hypokalemia: Secondary | ICD-10-CM | POA: Diagnosis not present

## 2019-06-24 DIAGNOSIS — R1084 Generalized abdominal pain: Secondary | ICD-10-CM | POA: Insufficient documentation

## 2019-06-24 DIAGNOSIS — L89152 Pressure ulcer of sacral region, stage 2: Secondary | ICD-10-CM | POA: Diagnosis not present

## 2019-06-24 DIAGNOSIS — R279 Unspecified lack of coordination: Secondary | ICD-10-CM | POA: Diagnosis not present

## 2019-06-24 DIAGNOSIS — Z66 Do not resuscitate: Secondary | ICD-10-CM | POA: Diagnosis not present

## 2019-06-24 DIAGNOSIS — N179 Acute kidney failure, unspecified: Secondary | ICD-10-CM | POA: Diagnosis not present

## 2019-06-24 DIAGNOSIS — R0682 Tachypnea, not elsewhere classified: Secondary | ICD-10-CM | POA: Diagnosis not present

## 2019-06-24 DIAGNOSIS — Z20822 Contact with and (suspected) exposure to covid-19: Secondary | ICD-10-CM | POA: Insufficient documentation

## 2019-06-24 DIAGNOSIS — Z4682 Encounter for fitting and adjustment of non-vascular catheter: Secondary | ICD-10-CM | POA: Diagnosis not present

## 2019-06-24 DIAGNOSIS — Z48815 Encounter for surgical aftercare following surgery on the digestive system: Secondary | ICD-10-CM | POA: Diagnosis not present

## 2019-06-24 DIAGNOSIS — Y9389 Activity, other specified: Secondary | ICD-10-CM | POA: Diagnosis not present

## 2019-06-24 DIAGNOSIS — K5651 Intestinal adhesions [bands], with partial obstruction: Secondary | ICD-10-CM | POA: Diagnosis not present

## 2019-06-24 DIAGNOSIS — F0390 Unspecified dementia without behavioral disturbance: Secondary | ICD-10-CM | POA: Diagnosis present

## 2019-06-24 DIAGNOSIS — R1311 Dysphagia, oral phase: Secondary | ICD-10-CM | POA: Diagnosis not present

## 2019-06-24 DIAGNOSIS — Z743 Need for continuous supervision: Secondary | ICD-10-CM | POA: Diagnosis not present

## 2019-06-24 DIAGNOSIS — R0602 Shortness of breath: Secondary | ICD-10-CM | POA: Diagnosis not present

## 2019-06-24 DIAGNOSIS — D649 Anemia, unspecified: Secondary | ICD-10-CM | POA: Diagnosis not present

## 2019-06-24 DIAGNOSIS — R6251 Failure to thrive (child): Secondary | ICD-10-CM | POA: Diagnosis not present

## 2019-06-24 DIAGNOSIS — R778 Other specified abnormalities of plasma proteins: Secondary | ICD-10-CM | POA: Diagnosis not present

## 2019-06-24 DIAGNOSIS — E43 Unspecified severe protein-calorie malnutrition: Secondary | ICD-10-CM | POA: Diagnosis not present

## 2019-06-24 DIAGNOSIS — E039 Hypothyroidism, unspecified: Secondary | ICD-10-CM | POA: Diagnosis not present

## 2019-06-24 DIAGNOSIS — E1169 Type 2 diabetes mellitus with other specified complication: Secondary | ICD-10-CM | POA: Diagnosis not present

## 2019-06-24 DIAGNOSIS — K565 Intestinal adhesions [bands], unspecified as to partial versus complete obstruction: Secondary | ICD-10-CM | POA: Diagnosis not present

## 2019-06-24 DIAGNOSIS — R6510 Systemic inflammatory response syndrome (SIRS) of non-infectious origin without acute organ dysfunction: Secondary | ICD-10-CM | POA: Diagnosis not present

## 2019-06-24 DIAGNOSIS — J690 Pneumonitis due to inhalation of food and vomit: Secondary | ICD-10-CM | POA: Diagnosis not present

## 2019-06-24 DIAGNOSIS — K56609 Unspecified intestinal obstruction, unspecified as to partial versus complete obstruction: Secondary | ICD-10-CM | POA: Diagnosis not present

## 2019-06-24 DIAGNOSIS — R2681 Unsteadiness on feet: Secondary | ICD-10-CM | POA: Diagnosis not present

## 2019-06-24 DIAGNOSIS — K219 Gastro-esophageal reflux disease without esophagitis: Secondary | ICD-10-CM | POA: Diagnosis not present

## 2019-06-24 DIAGNOSIS — E869 Volume depletion, unspecified: Secondary | ICD-10-CM | POA: Diagnosis not present

## 2019-06-24 DIAGNOSIS — K567 Ileus, unspecified: Secondary | ICD-10-CM | POA: Diagnosis not present

## 2019-06-24 DIAGNOSIS — R112 Nausea with vomiting, unspecified: Secondary | ICD-10-CM | POA: Diagnosis not present

## 2019-06-24 DIAGNOSIS — R109 Unspecified abdominal pain: Secondary | ICD-10-CM | POA: Diagnosis not present

## 2019-06-24 DIAGNOSIS — N170 Acute kidney failure with tubular necrosis: Secondary | ICD-10-CM | POA: Diagnosis not present

## 2019-06-24 DIAGNOSIS — I959 Hypotension, unspecified: Secondary | ICD-10-CM | POA: Diagnosis not present

## 2019-06-24 DIAGNOSIS — E119 Type 2 diabetes mellitus without complications: Secondary | ICD-10-CM | POA: Diagnosis not present

## 2019-06-24 DIAGNOSIS — J9601 Acute respiratory failure with hypoxia: Secondary | ICD-10-CM | POA: Diagnosis not present

## 2019-06-24 DIAGNOSIS — I10 Essential (primary) hypertension: Secondary | ICD-10-CM | POA: Diagnosis not present

## 2019-06-24 DIAGNOSIS — E7800 Pure hypercholesterolemia, unspecified: Secondary | ICD-10-CM | POA: Diagnosis not present

## 2019-06-24 LAB — POC SARS CORONAVIRUS 2 AG: SARS Coronavirus 2 Ag: NEGATIVE

## 2019-06-24 LAB — POC SARS CORONAVIRUS 2 AG -  ED: SARS Coronavirus 2 Ag: NEGATIVE

## 2019-06-24 MED ORDER — ONDANSETRON HCL 4 MG PO TABS: 4.0000 mg | ORAL_TABLET | Freq: Four times a day (QID) | ORAL | 0 refills | Status: DC

## 2019-06-24 NOTE — Discharge Instructions (Addendum)
Your COVID test is pending.  You should self quarantine until your test result is back and is negative.    Take Tylenol as needed for fever or discomfort.  Rest and keep yourself hydrated.    Go to the emergency department if you develop high fever, shortness of breath, severe diarrhea, or other concerning symptoms.    

## 2019-06-24 NOTE — ED Provider Notes (Signed)
[place]    CSN: [id number] Arrival date & time: 06/24/19  0849      History   Chief Complaint Chief Complaint  Patient presents with  . Abdominal Pain  . Emesis    HPI Gloria Murphy is a 84 y.o. female.   Reports nausea, vomiting, decreased appetite, headache, body aches, runny nose, and cough x 2 days. Denies fever, rash. Denies attempted treatments at home. Denies sick contact exposure. Reports that she received her first dose of the Covid vaccine x 3 weeks ago.   The history is provided by the patient and the spouse.  Abdominal Pain Associated symptoms: cough, fatigue, nausea and vomiting   Associated symptoms: no chest pain, no chills, no dysuria, no fever, no hematuria, no shortness of breath and no sore throat   Emesis Associated symptoms: abdominal pain, cough, headaches and myalgias   Associated symptoms: no arthralgias, no chills, no fever and no sore throat     Past Medical History:  Diagnosis Date  . Diabetes mellitus without complication ([place])   . High cholesterol   . Hypertension   . Thyroid disease     Patient Active Problem List   Diagnosis Date Noted  . Essential hypertension 02/22/2019  . Exertional dyspnea 02/04/2019  . Vision disturbance 12/19/2018  . TIA (transient ischemic attack) 12/19/2018  . Malignant carcinoid tumor of unknown primary site ([place]) 10/06/2017  . Pelvic mass 08/31/2017  . Elevated CA-125 08/31/2017  . Pelvic mass in female 08/31/2017    Past Surgical History:  Procedure Laterality Date  . ABDOMINAL HYSTERECTOMY    . EYE SURGERY Bilateral 06/2016   Cataract  . KNEE SURGERY Right   . LAPAROTOMY N/A 08/31/2017   Procedure: EXPLORATORY LAPAROTOMY; BILATERAL SALPINGOOPHERECTOMY; OMENTECTOMY, TUMOR DEBULKING;  Surgeon: Everitt Amber, MD;  Location: [facility];  Service: Gynecology;  Laterality: N/A;  . MASS EXCISION N/A 08/31/2017   Procedure: RESECTION OF PELVIC MASS;  Surgeon: Everitt Amber, MD;  Location: [facility];   Service: Gynecology;  Laterality: N/A;  . RIGHT HEART CATH N/A 02/11/2019   Procedure: RIGHT HEART CATH;  Surgeon: Nigel Mormon, MD;  Location: [facility];  Service: Cardiovascular;  Laterality: N/A;    OB History   No obstetric history on file.      Home Medications    Prior to Admission medications   Medication Sig Start Date End Date Taking? Authorizing Provider  aspirin EC 81 MG tablet Take 1 tablet (81 mg total) by mouth daily. 10/26/18   Miquel Dunn, NP  Calcium Carbonate (CALTRATE 600 PO) Take 1 tablet by mouth daily.    [provider]  carboxymethylcellulose (REFRESH TEARS) 0.5 % SOLN Place 1 drop into both eyes 3 (three) times daily.    [provider]  methimazole (TAPAZOLE) 5 MG tablet Take 5 mg by mouth daily.    [provider]  omeprazole (PRILOSEC) 20 MG capsule Take 1 capsule (20 mg total) by mouth daily. 10/25/17   Wieters, Hallie C, PA-C  ondansetron (ZOFRAN) 4 MG tablet Take 1 tablet (4 mg total) by mouth every 6 (six) hours. 06/24/19   Faustino Congress, NP    Family History Family History  Problem Relation Age of Onset  . Heart disease Mother   . Diabetes Mother   . Kidney failure Father   . Breast cancer Neg Hx     Social History Social History   Tobacco Use  . Smoking status: Never Smoker  . Smokeless  tobacco: Never Used  Substance Use Topics  . Alcohol use: No  . Drug use: No     Allergies   Patient has no known allergies.   Review of Systems Review of Systems  Constitutional: Positive for appetite change and fatigue. Negative for chills and fever.  HENT: Positive for rhinorrhea and sinus pressure. Negative for ear pain, sinus pain and sore throat.   Eyes: Negative for pain and visual disturbance.  Respiratory: Positive for cough. Negative for shortness of breath.   Cardiovascular: Negative for chest pain and palpitations.  Gastrointestinal: Positive for abdominal pain, nausea and  vomiting.  Genitourinary: Negative for dysuria and hematuria.  Musculoskeletal: Positive for myalgias. Negative for arthralgias and back pain.  Skin: Negative for color change and rash.  Neurological: Positive for headaches. Negative for dizziness, seizures, syncope, weakness and light-headedness.  Psychiatric/Behavioral: Negative for agitation, behavioral problems and confusion.  All other systems reviewed and are negative.    Physical Exam Triage Vital Signs ED Triage Vitals  Enc Vitals Group     BP 06/24/19 0912 (!) 164/71     Pulse Rate 06/24/19 0912 94     Resp 06/24/19 0912 18     Temp 06/24/19 0912 98.2 F (36.8 C)     Temp Source 06/24/19 0912 Oral     SpO2 06/24/19 0912 98 %     Weight 06/24/19 0910 160 lb (72.6 kg)     Height --      Head Circumference --      Peak Flow --      Pain Score 06/24/19 0910 8     Pain Loc --      Pain Edu? --      Excl. in [place]? --    No data found.  Updated Vital Signs BP (!) 164/71 (BP Location: Right Arm)   Pulse 94   Temp 98.2 F (36.8 C) (Oral)   Resp 18   Wt 160 lb (72.6 kg)   SpO2 98%   BMI 23.63 kg/m      Physical Exam Vitals and nursing note reviewed.  Constitutional:      General: She is not in acute distress.    Appearance: She is well-developed. She is ill-appearing.  HENT:     Head: Normocephalic and atraumatic.     Mouth/Throat:     Mouth: Mucous membranes are moist.     Pharynx: No pharyngeal swelling or oropharyngeal exudate.  Eyes:     Conjunctiva/sclera: Conjunctivae normal.  Cardiovascular:     Rate and Rhythm: Normal rate and regular rhythm.     Heart sounds: Normal heart sounds. No murmur.  Pulmonary:     Effort: Pulmonary effort is normal. No respiratory distress.     Breath sounds: Normal breath sounds. No stridor. No wheezing, rhonchi or rales.  Abdominal:     General: Abdomen is flat. Bowel sounds are normal. There is no distension.     Palpations: Abdomen is soft.     Tenderness: There is  generalized abdominal tenderness. Negative signs include Murphy's sign, Rovsing's sign and McBurney's sign.  Musculoskeletal:     Cervical back: Neck supple.  Skin:    General: Skin is warm and dry.     Capillary Refill: Capillary refill takes less than 2 seconds.  Neurological:     General: No focal deficit present.     Mental Status: She is alert and oriented to person, place, and time.  Psychiatric:        Mood  and Affect: Mood normal.        Behavior: Behavior normal.      UC Treatments / Results  Labs (all labs ordered are listed, but only abnormal results are displayed) Labs Reviewed  NOVEL CORONAVIRUS, NAA (HOSP ORDER, SEND-OUT TO REF LAB; TAT 18-24 HRS)  POC SARS CORONAVIRUS 2 AG -  ED  POC SARS CORONAVIRUS 2 AG    EKG   Radiology No results found.  Procedures Procedures (including critical care time)  Medications Ordered in UC Medications - No data to display  Initial Impression / Assessment and Plan / UC Course  I have reviewed the triage vital signs and the nursing notes.  Pertinent labs & imaging results that were available during my care of the patient were reviewed by me and considered in my medical decision making (see chart for details).     Possible Covid 19 infection. Rapid test in office today was negative. Abdominal pain, nausea, vomiting present. Prescribed Zofran. Gave instructions to self quarantine until Covid results are back. Send out Covid pending. Instructed on when to go to the ER. Will call patient with Covid results.  Final Clinical Impressions(s) / UC Diagnoses   Final diagnoses:  Nausea and vomiting, intractability of vomiting not specified, unspecified vomiting type  Generalized abdominal pain  Suspected 2019 novel coronavirus infection     Discharge Instructions     Your COVID test is pending.  You should self quarantine until your test result is back and is negative.    Take Tylenol as needed for fever or discomfort.  Rest  and keep yourself hydrated.    Go to the emergency department if you develop high fever, shortness of breath, severe diarrhea, or other concerning symptoms.       ED Prescriptions    Medication Sig Dispense Auth. Provider   ondansetron (ZOFRAN) 4 MG tablet Take 1 tablet (4 mg total) by mouth every 6 (six) hours. 12 tablet Faustino Congress, NP     I have reviewed the PDMP during this encounter.   Faustino Congress, NP 06/24/19 1003

## 2019-06-24 NOTE — ED Triage Notes (Signed)
Pt states she has been vomiting and stomach pain.  Pt state this started last night.

## 2019-06-26 ENCOUNTER — Encounter (HOSPITAL_COMMUNITY): Payer: Self-pay | Admitting: Emergency Medicine

## 2019-06-26 ENCOUNTER — Other Ambulatory Visit: Payer: Self-pay

## 2019-06-26 ENCOUNTER — Inpatient Hospital Stay (HOSPITAL_COMMUNITY)
Admission: EM | Admit: 2019-06-26 | Discharge: 2019-07-23 | DRG: 335 | Disposition: A | Discharge status: Skilled Nursing Facility | Payer: Medicare Other | Attending: Internal Medicine | Admitting: Internal Medicine

## 2019-06-26 ENCOUNTER — Emergency Department (HOSPITAL_COMMUNITY): Payer: Medicare Other

## 2019-06-26 ENCOUNTER — Inpatient Hospital Stay (HOSPITAL_COMMUNITY): Payer: Medicare Other

## 2019-06-26 DIAGNOSIS — R6510 Systemic inflammatory response syndrome (SIRS) of non-infectious origin without acute organ dysfunction: Secondary | ICD-10-CM | POA: Diagnosis not present

## 2019-06-26 DIAGNOSIS — Z7189 Other specified counseling: Secondary | ICD-10-CM | POA: Diagnosis not present

## 2019-06-26 DIAGNOSIS — E872 Acidosis, unspecified: Secondary | ICD-10-CM | POA: Diagnosis present

## 2019-06-26 DIAGNOSIS — F0390 Unspecified dementia without behavioral disturbance: Secondary | ICD-10-CM | POA: Diagnosis present

## 2019-06-26 DIAGNOSIS — R627 Adult failure to thrive: Secondary | ICD-10-CM | POA: Diagnosis not present

## 2019-06-26 DIAGNOSIS — J479 Bronchiectasis, uncomplicated: Secondary | ICD-10-CM | POA: Diagnosis present

## 2019-06-26 DIAGNOSIS — Z20822 Contact with and (suspected) exposure to covid-19: Secondary | ICD-10-CM | POA: Diagnosis present

## 2019-06-26 DIAGNOSIS — E1165 Type 2 diabetes mellitus with hyperglycemia: Secondary | ICD-10-CM | POA: Diagnosis not present

## 2019-06-26 DIAGNOSIS — R0600 Dyspnea, unspecified: Secondary | ICD-10-CM

## 2019-06-26 DIAGNOSIS — K567 Ileus, unspecified: Secondary | ICD-10-CM | POA: Diagnosis not present

## 2019-06-26 DIAGNOSIS — D649 Anemia, unspecified: Secondary | ICD-10-CM | POA: Diagnosis present

## 2019-06-26 DIAGNOSIS — L89152 Pressure ulcer of sacral region, stage 2: Secondary | ICD-10-CM | POA: Diagnosis not present

## 2019-06-26 DIAGNOSIS — R778 Other specified abnormalities of plasma proteins: Secondary | ICD-10-CM

## 2019-06-26 DIAGNOSIS — Z66 Do not resuscitate: Secondary | ICD-10-CM | POA: Diagnosis not present

## 2019-06-26 DIAGNOSIS — Z515 Encounter for palliative care: Secondary | ICD-10-CM | POA: Diagnosis not present

## 2019-06-26 DIAGNOSIS — G9341 Metabolic encephalopathy: Secondary | ICD-10-CM | POA: Diagnosis not present

## 2019-06-26 DIAGNOSIS — C7A00 Malignant carcinoid tumor of unspecified site: Secondary | ICD-10-CM | POA: Diagnosis present

## 2019-06-26 DIAGNOSIS — R0682 Tachypnea, not elsewhere classified: Secondary | ICD-10-CM

## 2019-06-26 DIAGNOSIS — R7989 Other specified abnormal findings of blood chemistry: Secondary | ICD-10-CM | POA: Diagnosis present

## 2019-06-26 DIAGNOSIS — R509 Fever, unspecified: Secondary | ICD-10-CM

## 2019-06-26 DIAGNOSIS — R000 Tachycardia, unspecified: Secondary | ICD-10-CM | POA: Diagnosis not present

## 2019-06-26 DIAGNOSIS — Z6824 Body mass index (BMI) 24.0-24.9, adult: Secondary | ICD-10-CM

## 2019-06-26 DIAGNOSIS — E876 Hypokalemia: Secondary | ICD-10-CM | POA: Diagnosis not present

## 2019-06-26 DIAGNOSIS — N179 Acute kidney failure, unspecified: Secondary | ICD-10-CM | POA: Diagnosis present

## 2019-06-26 DIAGNOSIS — J9601 Acute respiratory failure with hypoxia: Secondary | ICD-10-CM | POA: Diagnosis not present

## 2019-06-26 DIAGNOSIS — Z90711 Acquired absence of uterus with remaining cervical stump: Secondary | ICD-10-CM

## 2019-06-26 DIAGNOSIS — K6389 Other specified diseases of intestine: Secondary | ICD-10-CM

## 2019-06-26 DIAGNOSIS — Z8544 Personal history of malignant neoplasm of other female genital organs: Secondary | ICD-10-CM

## 2019-06-26 DIAGNOSIS — E43 Unspecified severe protein-calorie malnutrition: Secondary | ICD-10-CM | POA: Diagnosis present

## 2019-06-26 DIAGNOSIS — E039 Hypothyroidism, unspecified: Secondary | ICD-10-CM | POA: Diagnosis present

## 2019-06-26 DIAGNOSIS — R300 Dysuria: Secondary | ICD-10-CM | POA: Diagnosis not present

## 2019-06-26 DIAGNOSIS — R112 Nausea with vomiting, unspecified: Secondary | ICD-10-CM | POA: Diagnosis present

## 2019-06-26 DIAGNOSIS — E871 Hypo-osmolality and hyponatremia: Secondary | ICD-10-CM | POA: Diagnosis not present

## 2019-06-26 DIAGNOSIS — R109 Unspecified abdominal pain: Secondary | ICD-10-CM

## 2019-06-26 DIAGNOSIS — Z833 Family history of diabetes mellitus: Secondary | ICD-10-CM

## 2019-06-26 DIAGNOSIS — R1312 Dysphagia, oropharyngeal phase: Secondary | ICD-10-CM | POA: Diagnosis not present

## 2019-06-26 DIAGNOSIS — N170 Acute kidney failure with tubular necrosis: Secondary | ICD-10-CM | POA: Diagnosis present

## 2019-06-26 DIAGNOSIS — I10 Essential (primary) hypertension: Secondary | ICD-10-CM | POA: Diagnosis not present

## 2019-06-26 DIAGNOSIS — K219 Gastro-esophageal reflux disease without esophagitis: Secondary | ICD-10-CM | POA: Diagnosis present

## 2019-06-26 DIAGNOSIS — E7800 Pure hypercholesterolemia, unspecified: Secondary | ICD-10-CM | POA: Diagnosis present

## 2019-06-26 DIAGNOSIS — Z0189 Encounter for other specified special examinations: Secondary | ICD-10-CM

## 2019-06-26 DIAGNOSIS — E119 Type 2 diabetes mellitus without complications: Secondary | ICD-10-CM

## 2019-06-26 DIAGNOSIS — Z4659 Encounter for fitting and adjustment of other gastrointestinal appliance and device: Secondary | ICD-10-CM

## 2019-06-26 DIAGNOSIS — E869 Volume depletion, unspecified: Secondary | ICD-10-CM | POA: Diagnosis present

## 2019-06-26 DIAGNOSIS — K565 Intestinal adhesions [bands], unspecified as to partial versus complete obstruction: Secondary | ICD-10-CM | POA: Diagnosis present

## 2019-06-26 DIAGNOSIS — I959 Hypotension, unspecified: Secondary | ICD-10-CM | POA: Diagnosis not present

## 2019-06-26 DIAGNOSIS — K56609 Unspecified intestinal obstruction, unspecified as to partial versus complete obstruction: Secondary | ICD-10-CM | POA: Diagnosis not present

## 2019-06-26 DIAGNOSIS — Z8249 Family history of ischemic heart disease and other diseases of the circulatory system: Secondary | ICD-10-CM

## 2019-06-26 DIAGNOSIS — R140 Abdominal distension (gaseous): Secondary | ICD-10-CM | POA: Diagnosis not present

## 2019-06-26 DIAGNOSIS — L8990 Pressure ulcer of unspecified site, unspecified stage: Secondary | ICD-10-CM | POA: Insufficient documentation

## 2019-06-26 DIAGNOSIS — J690 Pneumonitis due to inhalation of food and vomit: Secondary | ICD-10-CM | POA: Diagnosis not present

## 2019-06-26 DIAGNOSIS — F05 Delirium due to known physiological condition: Secondary | ICD-10-CM | POA: Diagnosis not present

## 2019-06-26 DIAGNOSIS — Z79899 Other long term (current) drug therapy: Secondary | ICD-10-CM

## 2019-06-26 DIAGNOSIS — R6251 Failure to thrive (child): Secondary | ICD-10-CM

## 2019-06-26 DIAGNOSIS — Z841 Family history of disorders of kidney and ureter: Secondary | ICD-10-CM

## 2019-06-26 DIAGNOSIS — Z7982 Long term (current) use of aspirin: Secondary | ICD-10-CM

## 2019-06-26 LAB — CBC
HCT: 44.6 % (ref 36.0–46.0)
Hemoglobin: 14.5 g/dL (ref 12.0–15.0)
MCH: 25.8 pg — ABNORMAL LOW (ref 26.0–34.0)
MCHC: 32.5 g/dL (ref 30.0–36.0)
MCV: 79.2 fL — ABNORMAL LOW (ref 80.0–100.0)
Platelets: 254 10*3/uL (ref 150–400)
RBC: 5.63 MIL/uL — ABNORMAL HIGH (ref 3.87–5.11)
RDW: 16.8 % — ABNORMAL HIGH (ref 11.5–15.5)
WBC: 6.4 10*3/uL (ref 4.0–10.5)
nRBC: 0 % (ref 0.0–0.2)

## 2019-06-26 LAB — TROPONIN I (HIGH SENSITIVITY)
Troponin I (High Sensitivity): 27 ng/L — ABNORMAL HIGH (ref <18)
Troponin I (High Sensitivity): 27 ng/L — ABNORMAL HIGH (ref <18)

## 2019-06-26 LAB — NOVEL CORONAVIRUS, NAA (HOSP ORDER, SEND-OUT TO REF LAB; TAT 18-24 HRS): SARS-CoV-2, NAA: NOT DETECTED

## 2019-06-26 LAB — COMPREHENSIVE METABOLIC PANEL
ALT: 22 U/L (ref 0–44)
AST: 31 U/L (ref 15–41)
Albumin: 3.8 g/dL (ref 3.5–5.0)
Alkaline Phosphatase: 85 U/L (ref 38–126)
Anion gap: 20 — ABNORMAL HIGH (ref 5–15)
BUN: 48 mg/dL — ABNORMAL HIGH (ref 8–23)
CO2: 28 mmol/L (ref 22–32)
Calcium: 10.2 mg/dL (ref 8.9–10.3)
Chloride: 88 mmol/L — ABNORMAL LOW (ref 98–111)
Creatinine, Ser: 2.52 mg/dL — ABNORMAL HIGH (ref 0.44–1.00)
GFR calc Af Amer: 20 mL/min — ABNORMAL LOW (ref >60)
GFR calc non Af Amer: 17 mL/min — ABNORMAL LOW (ref >60)
Glucose, Bld: 233 mg/dL — ABNORMAL HIGH (ref 70–99)
Potassium: 3.7 mmol/L (ref 3.5–5.1)
Sodium: 136 mmol/L (ref 135–145)
Total Bilirubin: 1.3 mg/dL — ABNORMAL HIGH (ref 0.3–1.2)
Total Protein: 9.6 g/dL — ABNORMAL HIGH (ref 6.5–8.1)

## 2019-06-26 LAB — RESPIRATORY PANEL BY RT PCR (FLU A&B, COVID)
Influenza A by PCR: NEGATIVE
Influenza B by PCR: NEGATIVE
SARS Coronavirus 2 by RT PCR: NEGATIVE

## 2019-06-26 LAB — T4, FREE: Free T4: 1.48 ng/dL — ABNORMAL HIGH (ref 0.61–1.12)

## 2019-06-26 LAB — TSH: TSH: 0.027 u[IU]/mL — ABNORMAL LOW (ref 0.350–4.500)

## 2019-06-26 LAB — MAGNESIUM: Magnesium: 2.3 mg/dL (ref 1.7–2.4)

## 2019-06-26 LAB — LACTIC ACID, PLASMA
Lactic Acid, Venous: 3.6 mmol/L (ref 0.5–1.9)
Lactic Acid, Venous: 2.2 mmol/L (ref 0.5–1.9)

## 2019-06-26 LAB — LIPASE, BLOOD: Lipase: 38 U/L (ref 11–51)

## 2019-06-26 MED ORDER — PHENOL 1.4 % MT LIQD
1.0000 | OROMUCOSAL | Status: DC | PRN
  Administered 2019-06-27 – 2019-06-28 (×3): 1 via OROMUCOSAL
  Filled 2019-06-26: qty 177

## 2019-06-26 MED ORDER — INSULIN ASPART 100 UNIT/ML ~~LOC~~ SOLN
0.0000 [IU] | Freq: Four times a day (QID) | SUBCUTANEOUS | Status: DC
  Administered 2019-06-27 – 2019-07-01 (×8): 1 [IU] via SUBCUTANEOUS
  Administered 2019-07-02 (×2): 3 [IU] via SUBCUTANEOUS
  Filled 2019-06-26: qty 0.06

## 2019-06-26 MED ORDER — SODIUM CHLORIDE 0.9 % IV BOLUS
1000.0000 mL | Freq: Once | INTRAVENOUS | Status: AC
  Administered 2019-06-26: 1000 mL via INTRAVENOUS

## 2019-06-26 MED ORDER — PIPERACILLIN-TAZOBACTAM IN DEX 2-0.25 GM/50ML IV SOLN
2.2500 g | Freq: Three times a day (TID) | INTRAVENOUS | Status: DC
  Administered 2019-06-27: 2.25 g via INTRAVENOUS
  Filled 2019-06-26 (×2): qty 50

## 2019-06-26 MED ORDER — SODIUM CHLORIDE 0.9% FLUSH
3.0000 mL | Freq: Two times a day (BID) | INTRAVENOUS | Status: DC
  Administered 2019-06-26 – 2019-07-22 (×23): 3 mL via INTRAVENOUS

## 2019-06-26 MED ORDER — ONDANSETRON HCL 4 MG/2ML IJ SOLN
4.0000 mg | Freq: Once | INTRAMUSCULAR | Status: AC
  Administered 2019-06-26: 4 mg via INTRAVENOUS
  Filled 2019-06-26: qty 2

## 2019-06-26 MED ORDER — LACTATED RINGERS IV SOLN: INTRAVENOUS | Status: DC

## 2019-06-26 MED ORDER — SODIUM CHLORIDE 0.9% FLUSH: 3.0000 mL | Freq: Once | INTRAVENOUS | Status: DC

## 2019-06-26 MED ORDER — METOCLOPRAMIDE HCL 5 MG/ML IJ SOLN
10.0000 mg | Freq: Once | INTRAMUSCULAR | Status: AC
  Administered 2019-06-26: 10 mg via INTRAVENOUS
  Filled 2019-06-26: qty 2

## 2019-06-26 MED ORDER — PIPERACILLIN-TAZOBACTAM 3.375 G IVPB 30 MIN
3.3750 g | Freq: Once | INTRAVENOUS | Status: AC
  Administered 2019-06-26: 17:00:00 3.375 g via INTRAVENOUS
  Filled 2019-06-26: qty 50

## 2019-06-26 MED ORDER — PROMETHAZINE HCL 25 MG/ML IJ SOLN: 12.5000 mg | Freq: Four times a day (QID) | INTRAMUSCULAR | Status: DC | PRN

## 2019-06-26 MED ORDER — DIPHENHYDRAMINE HCL 50 MG/ML IJ SOLN
12.5000 mg | Freq: Once | INTRAMUSCULAR | Status: AC
  Administered 2019-06-26: 12.5 mg via INTRAVENOUS
  Filled 2019-06-26: qty 1

## 2019-06-26 MED ORDER — FENTANYL CITRATE (PF) 100 MCG/2ML IJ SOLN
25.0000 ug | INTRAMUSCULAR | Status: DC | PRN
  Administered 2019-06-26 – 2019-06-28 (×5): 25 ug via INTRAVENOUS
  Filled 2019-06-26 (×5): qty 2

## 2019-06-26 MED ORDER — ONDANSETRON HCL 4 MG/2ML IJ SOLN
4.0000 mg | Freq: Four times a day (QID) | INTRAMUSCULAR | Status: DC | PRN
  Administered 2019-06-27 – 2019-07-11 (×3): 4 mg via INTRAVENOUS
  Filled 2019-06-26 (×3): qty 2

## 2019-06-26 NOTE — ED Notes (Signed)
Xray at bedside to verify NG tube placement.

## 2019-06-26 NOTE — ED Provider Notes (Signed)
Care of the patient assumed at signout. 5:24 PM Patient awake, alert, sitting upright.  We discussed upcoming surgical consult, she requests pain medication. 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  7:04 PM I discussed patient case with our internal medicine colleagues for admission.   Carmin Muskrat, MD 06/26/19 1904

## 2019-06-26 NOTE — ED Notes (Signed)
Per Vanita Panda, MD, hold NG tube insertion until consulted with surgery.

## 2019-06-26 NOTE — ED Notes (Signed)
This RN ambulated patient to restroom to obtain urine sample. Patient unable to void at this time. Will attempt again later.

## 2019-06-26 NOTE — H&P (Signed)
History and Physical    PLEASE NOTE THAT DRAGON DICTATION SOFTWARE WAS USED IN THE CONSTRUCTION OF THIS NOTE.   Gloria Murphy [id number] DOB: 1936/04/28 DOA: 06/26/2019  PCP: Carlyle Basques, MD Patient coming from: home   I have personally briefly reviewed patient's old medical records in [place]  Chief Complaint: Abdominal pain  HPI: Gloria Murphy is a 84 y.o. female with medical history significant for pelvic carcinoid tumor status post resection in April 2019, type 2 diabetes mellitus managed via lifestyle modifications, who is admitted to [facility] on 06/26/2019 with small bowel obstruction [facility] complaining of abdominal pain.   The patient reports 5 to 6 days of progressive abdominal discomfort.  She reports that this pain is sharp in nature, and located in the bilateral lower abdominal quadrants, without radiation from this location.  She reports that the pain is been constant over that timeframe, and worsens with palpation over the bilateral lower abdominal quadrants.  She notes associated nausea resulting in 4-5 daily episodes of nonbloody, nonbilious emesis over the last 3 to 4 days, with most recent episode of vomiting occurring emergency department this evening.  In the setting, the patient reports diminished oral intake over the course of the last week.  Denies any recent diarrhea, reports that her most recent bowel movement occurred on the morning of 06/26/2019, following which she has noted diminished flatus production.  Denies any recent melena or hematochezia.  Denies any recent trauma or traveling.  Denies any associated subjective fever, chills, rigors, or generalized myalgias. she denies any associated chest pain, sob, palpitations, or diaphoresis.   Patient's history is notable for a history of pelvic carcinoid tumor for which she underwent exploratory laparotomy in April 2019 during  which she underwent resection of this pelvic mass, bilateral salpingo-oophorectomy, and omentectomy.  Prior to this, she had also undergone partial hysterectomy.   In the setting of the history of pelvic carcinoid tumor, the patient reports that she actively follows with oncology, stating that she most recently saw her oncologist over the last 2-3 months, although she does not recall the name of her oncologist at this time.  The patient also reports that she has underlying thyroid pathology, but does not recall if this is hyperthyroidism or hypothyroidism.,  She reports that she was just started on thyroid medication by her PCP approximately 2 weeks ago, but does not recall the specific name of this medication.  Denies any recent headache, neck stiffness, rhinitis, rhinorrhea, sore throat, cough, or rash. No recent known COVID-19 exposures. Denies dysuria, gross hematuria, or change in urinary urgency/frequency.   ED Course:  Vital signs in the ED were notable for the following: Temperature max 97.5; heart rate initially noted to be 146, which decreased to 126 following interval administration of IV fluids, as further described below; initial blood pressure 106/79, which subsequently increased to 159/90 following interval IV fluids; respiratory rate 16-18; oxygen saturation 99 to 100% on room air.  Labs were notable for the following: CMP notable for sodium 136, potassium 3.5, bicarbonate 28, BUN 48, creatinine 2.52 relative to most recent prior creatinine data point of 0.98 when checked on 02/26/2019, alkaline phosphatase 85, AST 31, ALT 22, and total bilirubin 1.3.  Lipase 38.  CBC notable for white blood cell count of 6400, hemoglobin 14.5.  Lactic acid 3.6.  Initial high sensitivity troponin I found to be 27, with repeat value found to be identical  at 36.  Urinalysis was ordered, with result currently pending.  Routine screening nasopharyngeal COVID-19 PCR swab was performed, with result currently  pending.  Blood cultures x2 were collected prior to initiation of any antibiotics.  Chest x-ray showed no evidence of acute cardiopulmonary process.  CT of the abdomen/pelvis showed evidence of small bowel obstruction with marked gastric distention, moderate small bowel distention associated with gastric pneumatosis and portal venous gas, while also showing evidence of a calcified mesenteric lesion within the right lower quadrant, suggesting ileal carcinoid tumor, in the absence of evidence of free air.  In the setting of mildly elevated presenting troponin, EKG was obtained and showed sinus tachycardia with heart rate 109, ST depression in leads II, III, aVF, V5, and V6, with ST depression in these leads also noted to be present on most recent prior EKG performed on 02/22/2019.  Nonspecific ST depression noted in V3 and V4; no evidence of ST elevation.  Imaging were discussed with the on-call general surgeon, Dr. Leighton Ruff, who evaluated the patient in person and recommended admission to the hospitalist service for further evaluation management of presenting small bowel obstruction with pneumatosis.  In this setting, Dr. Marcello Moores recommended placement of NG tube as well as initiation of IV antibiotics.  Dr. Marcello Moores will continue to follow the patient during this hospitalization.  While in the ED, the following were administered: Fentanyl 25 mcg IV x1, Reglan 10 mg IV x1, Zofran 4 mg IV x1 Zosyn 3.375 g IV x1, and a 2 L IV normal saline bolus.  While still in the ED, placement of NG tube attached to low intermittent wall suction was ordered.     Review of Systems: As per HPI otherwise 10 point review of systems negative.   Past Medical History:  Diagnosis Date  . Diabetes mellitus without complication ([place])   . High cholesterol   . Hypertension   . Thyroid disease     Past Surgical History:  Procedure Laterality Date  . ABDOMINAL HYSTERECTOMY    . EYE SURGERY Bilateral 06/2016   Cataract  .  KNEE SURGERY Right   . LAPAROTOMY N/A 08/31/2017   Procedure: EXPLORATORY LAPAROTOMY; BILATERAL SALPINGOOPHERECTOMY; OMENTECTOMY, TUMOR DEBULKING;  Surgeon: Everitt Amber, MD;  Location: [facility];  Service: Gynecology;  Laterality: N/A;  . MASS EXCISION N/A 08/31/2017   Procedure: RESECTION OF PELVIC MASS;  Surgeon: Everitt Amber, MD;  Location: [facility];  Service: Gynecology;  Laterality: N/A;  . RIGHT HEART CATH N/A 02/11/2019   Procedure: RIGHT HEART CATH;  Surgeon: Nigel Mormon, MD;  Location: [facility];  Service: Cardiovascular;  Laterality: N/A;    Social History:  reports that she has never smoked. She has never used smokeless tobacco. She reports that she does not drink alcohol or use drugs.   No Known Allergies  Family History  Problem Relation Age of Onset  . Heart disease Mother   . Diabetes Mother   . Kidney failure Father   . Breast cancer Neg Hx      Prior to Admission medications   Medication Sig Start Date End Date Taking? Authorizing Provider  Calcium Carbonate (CALTRATE 600 PO) Take 1 tablet by mouth daily.   Yes [provider]  carboxymethylcellulose (REFRESH TEARS) 0.5 % SOLN Place 1 drop into both eyes 3 (three) times daily.   Yes [provider]  megestrol (MEGACE ES) 625 MG/5ML suspension Take 625 mg by mouth daily. 05/30/19  Yes [provider]  ondansetron [facility])  4 MG tablet Take 1 tablet (4 mg total) by mouth every 6 (six) hours. 06/24/19  Yes Faustino Congress, NP  aspirin EC 81 MG tablet Take 1 tablet (81 mg total) by mouth daily. Patient not taking: Reported on 06/26/2019 10/26/18   Miquel Dunn, NP  omeprazole (PRILOSEC) 20 MG capsule Take 1 capsule (20 mg total) by mouth daily. Patient not taking: Reported on 06/26/2019 10/25/17   Janith Lima, PA-C     Objective    Physical Exam: Vitals:   06/26/19 1347 06/26/19 1453 06/26/19 1600 06/26/19 1634  BP: 106/79 (!) 145/94 (!) 172/91 (!) 123/91  Pulse:  (!) 146 (!) 108 96 (!) 126  Resp: 18 (!) 24 16 16   Temp: (!) 97.5 F (36.4 C)     TempSrc: Oral     SpO2: 93% 99% 100% 100%    General: appears to be stated age; alert, oriented Skin: warm, dry, no rash; surgical scar on abdomen noted. Head:  AT/[place] Eyes:  PEARL b/l, EOMI Mouth:  Oral mucosa membranes appear dry, normal dentition Neck: supple; trachea midline Heart: Mildly tachycardic, but regular; did not appreciate any M/R/G Lungs: CTAB, did not appreciate any wheezes, rales, or rhonchi Abdomen: Hypoactive bowel sounds; soft, ND, mildly tender to palpation in the bilateral lower quadrants in the absence of any associated guarding, rigidity, or rebound tenderness. Vascular: 2+ pedal pulses b/l; 2+ radial pulses b/l Extremities: no peripheral edema, no muscle wasting   Labs on Admission: I have personally reviewed following labs and imaging studies  CBC: Recent Labs  Lab 06/26/19 1352  WBC 6.4  HGB 14.5  HCT 44.6  MCV 79.2*  PLT 0000000   Basic Metabolic Panel: Recent Labs  Lab 06/26/19 1352  NA 136  K 3.7  CL 88*  CO2 28  GLUCOSE 233*  BUN 48*  CREATININE 2.52*  CALCIUM 10.2   GFR: Estimated Creatinine Clearance: 18.3 mL/min (A) (by C-G formula based on SCr of 2.52 mg/dL (H)). Liver Function Tests: Recent Labs  Lab 06/26/19 1352  AST 31  ALT 22  ALKPHOS 85  BILITOT 1.3*  PROT 9.6*  ALBUMIN 3.8   Recent Labs  Lab 06/26/19 1352  LIPASE 38   No results for input(s): AMMONIA in the last 168 hours. Coagulation Profile: No results for input(s): INR, PROTIME in the last 168 hours. Cardiac Enzymes: No results for input(s): CKTOTAL, CKMB, CKMBINDEX, TROPONINI in the last 168 hours. BNP (last 3 results) No results for input(s): PROBNP in the last 8760 hours. HbA1C: No results for input(s): HGBA1C in the last 72 hours. CBG: No results for input(s): GLUCAP in the last 168 hours. Lipid Profile: No results for input(s): CHOL, HDL, LDLCALC, TRIG, CHOLHDL,  LDLDIRECT in the last 72 hours. Thyroid Function Tests: Recent Labs    06/26/19 1530  TSH 0.027*   Anemia Panel: No results for input(s): VITAMINB12, FOLATE, FERRITIN, TIBC, IRON, RETICCTPCT in the last 72 hours. Urine analysis:    Component Value Date/Time   COLORURINE YELLOW 08/28/2017 1057   APPEARANCEUR HAZY (A) 08/28/2017 1057   LABSPEC 1.020 10/25/2017 1931   PHURINE 5.5 10/25/2017 1931   GLUCOSEU NEGATIVE 10/25/2017 1931   HGBUR NEGATIVE 10/25/2017 1931   BILIRUBINUR NEGATIVE 10/25/2017 1931   KETONESUR NEGATIVE 10/25/2017 1931   PROTEINUR NEGATIVE 10/25/2017 1931   UROBILINOGEN 0.2 10/25/2017 1931   NITRITE NEGATIVE 10/25/2017 1931   LEUKOCYTESUR SMALL (A) 10/25/2017 1931    Radiological Exams on Admission: DG Chest Port 1 [address]  Result Date: 06/26/2019 CLINICAL DATA:  Shortness of breath EXAM: PORTABLE CHEST 1 VIEW COMPARISON:  06/03/2010 FINDINGS: Cardiac shadows within normal limits. The lungs are well aerated bilaterally. No focal infiltrate or sizable effusion is seen. No bony abnormality is noted. IMPRESSION: No active disease. Electronically Signed   By: Inez Catalina M.D.   On: 06/26/2019 14:40   CT Renal Stone Study  Result Date: 06/26/2019 CLINICAL DATA:  Flank pain, nausea vomiting history of ovarian carcinoid tumor. EXAM: CT ABDOMEN AND PELVIS WITHOUT CONTRAST TECHNIQUE: Multidetector CT imaging of the abdomen and pelvis was performed following the standard protocol without IV contrast. COMPARISON:  07/27/2017 FINDINGS: Lower chest: Basilar atelectasis though with nodular changes at the left lung base. These are ill-defined. No dense consolidation or effusion. Hepatobiliary: Signs of portal venous gas. Limited assessment of the liver on noncontrast imaging. No signs of biliary ductal dilation. Pancreas: Pancreas displaced by markedly distended stomach. No signs of focal pancreatic lesion. Spleen: Spleen is normal. Adrenals/Urinary Tract: Adrenal glands are normal.  No signs of hydronephrosis or nephrolithiasis. Stomach/Bowel: Gastric distension and small bowel dilation terminating in the distal jejunum in the right lower quadrant anterior to the iliac vasculature. Distal small bowel loops are decompressed. Adjacent to the appendix is a mesenteric nodule with calcification measuring approximately 12 mm. There is mild distension of the distal ileum. Question of filling defect within the distal ileum with 53 Hounsfield unit density within the terminal ileum at this location. The appendix is normal. Signs of gastric pneumatosis likely the source of portal venous gas. Gas in the wall of the stomach is best seen on image 51 of series 2. There is gas surrounding intraluminal contents of the stomach in other areas potentially intermixed with gastric pneumatosis as well, for instance on image 35 along the lateral stomach. Gas within the extrahepatic portal system is not seen. Vascular/Lymphatic: Calcified atherosclerotic changes throughout the abdominal aorta. No signs of adenopathy in the retroperitoneum. Presumed mesenteric mass or adenopathy in the right lower quadrant (image 53, series 2) this is measured above. Reproductive: Post hysterectomy. No sign of pelvic mass with transition point for small-bowel obstruction in the pelvis. Other: No signs of free air at this time. Small amount of fluid in the pelvis. Musculoskeletal: No signs of acute bone finding or destructive bone process. IMPRESSION: 1. Small-bowel obstruction with marked gastric distension, moderate to marked small bowel distension, associated with gastric pneumatosis and portal venous gas. Surgical consultation is suggested. 2. Constellation of findings likely related to adhesions in the pelvis given appearance of small bowel loops. 3. 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 Subtle filling defect with soft tissue density suggested on today's evaluation. 4. Basilar  subtle nodularity may represent early aspiration pneumonitis. Attention on follow-up. These results were called by telephone at the time of interpretation on 06/26/2019 at 5:11 pm to provider Gloria Murphy and Dr. Vanita Panda, Who verbally acknowledged these results. Electronically Signed   By: Zetta Bills M.D.   On: 06/26/2019 17:11     EKG: Independently reviewed, with result as described above.    Assessment/Plan   SUKAYNA JUN is a 84 y.o. female with medical history significant for pelvic carcinoid tumor status post resection in April 2019, type 2 diabetes mellitus managed via lifestyle modifications, who is admitted to [facility] on 06/26/2019 with small bowel obstruction after presenting from home to [facility] Emergency Department complaining of abdominal pain.    Principal Problem:  SBO (small bowel obstruction) (HCC) Active Problems:   Malignant carcinoid tumor of unknown primary site [facility])   Type 2 diabetes mellitus without complication, without long-term current use of insulin (HCC)   AKI (acute kidney injury) (HCC)   Nausea and vomiting   Abdominal pain   Lactic acidosis   Elevated troponin   #) Small bowel obstruction: Diagnosis on the basis of 1 week of progressive abdominal discomfort associated with nausea and vomiting, with CT abdomen/pelvis showing evidence of obstruction associated with gastric distention and gastric pneumatosis.  Suspect contribution to patient's mechanical small bowel obstruction from postop adhesions in the setting of a history of exploratory laparotomy in April 2019 during which she underwent resection of pelvic mass as well as bilateral salpingo-oophorectomy and omentectomy.  Today CT abdomen/pelvis also showed evidence of a primary ileal carcinoid tumor that is felt to be less likely to be directly contributory to her current small bowel stricture at this time.  The patient's case and imaging were discussed with the on-call general  surgeon, Dr. Marcello Moores, amended admission to the hospital service for further evaluation management of presenting small bowel stricturing with pneumatosis, and recommended placement of NG tube as well as IV antibiotics.  For urgent surgical intervention was identified, but rather recommendations to proceed with the above conservative management of presenting small bowel obstruction while monitoring closely for spontaneous resolution of such.  Dr. Marcello Moores will continue to follow the patient during this hospitalization.  A 2 L IV normal saline bolus was administered in the ED.   Plan: Strict NPO. LR @ 100 cc per hour. Prn IV fentanyl. Prn IV Zofran, with as needed IV Phenergan for refractory nausea/vomiting.  Orders placed for NG tube attached to low, intermittent wall suction. SCD's for DVT prophylaxis in case the patient should ultimately require surgical intervention.  Recheck CMP and CBC in the morning.  General surgery formally consulted, will continue to follow during this hospitalization.       #) carcinoid tumor: It appears that the patient has a history of pelvic carcinoid tumor for which he underwent exploratory laparotomy in April 2019 by Dr. Denman George, at which time pelvic mass resection, bilateral salpingo-oophorectomy, and omentectomy were performed.  Subsequently, the patient reports that she has been following with oncology, as further stated above, although she does not recall the name of her oncologist at this time.  Per radiology report, CT abdomen/pelvis performed this evening and so of calcified mesenteric lesion within the right lower quadrant, suggestive of ileal carcinoid tumor.   Plan: will attempt additional chart review, including from a oncologic history standpoint to determine if the patient has been following, and what previous evaluation and management has been pursued.  Additionally, will attempt to review prior imaging studies to gain better insight into the acuity of tonight's CT  abdomen/pelvis findings that were suggestive of ileal carcinoid tumor.     #) Acute Kidney Injury: Presenting creatinine found to be 2.52 relative to most recent prior creatinine data point 0.98 when checked on 02/26/2019. Suspect that this is prerenal in nature stemming from intravascular depletion due to dehydration from several days of nausea/vomiting as well as diminished oral intake over that time, as further described above.  Urinalysis has been ordered, with result currently pending.  Plan: monitor strict I's & O's and daily weights. Attempt to avoid nephrotoxic agents. Refrain from NSAIDs.  Will follow for results of urinalysis, and also check random urine sodium as well as random urine creatinine.  Repeat BMP  in the morning. Check serum magnesium level.  Continuous lactated Ringer's, as above.      #) Elevated troponin: mildly elevated initial high-sensitivity troponin I of 27, with repeat value performed 2 hours later noted to be unchanged at 27. No prior high sensitivity troponin I value available for point of comparison.  Suspect that this mildly elevated troponin is on the basis of supply demand mismatch in the setting of presenting tachycardia with additional contribution from diminished renal clearance in the setting of acute kidney injury, as further described above, as opposed to representing a type I process due to acute plaque rupture.  The patient denies any recent chest pain, while presenting chest x-ray shows no evidence of acute cardiopulmonary process, including no evidence of pneumothorax.  Presenting EKG shows sinus tachycardia with ST depression in inferior and lateral leads, which was also noted to be present in most recent prior EKG performed on 02/22/2019.  Today's EKG also shows nonspecific ST depression in V3 and V4, with no evidence of ST elevation. Overall, ACS is felt to be less likely relative to type 2 supply demand mismatch with his renal clearance, as above, but  will closely monitor on telemetry overnight while treating suspected underlying SBO as well as resultant dehydration and acute kidney injury, as further described above.  Plan: repeat troponin in the AM. Monitor on telemetry. PRN EKG for development of chest pain. Will repeat EKG in the morning to evaluate for any interval ST changes. Check serum Mg level and repeat BMP in the morning. Repeat CBC in the AM. Additional evaluation and management of presenting SBO, dehydration, and AKI as suspected driving factors behind mildly elevated troponin, as above. could consider obtaining an echocardiogram to assess for interval focal wall motion abnormalities as a means of helping to further guide the necessity of additional ischemic workup.    #) Lactic acidosis: Lactic acid noted to be 3.6.  While this does not appear to be associated with sepsis in the absence of meeting SIRS criteria, there appeared to be multiple noninfectious etiologies contributing to mildly elevated presenting lactic acid, including the specter contributions from dehydration itself, acute kidney injury, starvation keto-lactic acidosis in the setting of diminished oral intake over the course the last week, as well as the possibility of contribution from malignancy in the setting of a documented history of carcinoid tumor, with evidence of ileal carcinoid tumor evident on this evening CT abdomen/pelvis, as further described above.  Status post 2 L IV normal saline bolus administered in the ED this evening.  Plan: Lactated Ringer's at 100 cc/h.  Monitor strict I's and O's.  Repeat lactic acid level in 2 hours.  Repeat BMP in the morning.  Additional work-up and management of presenting acute kidney injury, as further described above     #) Unspecified thyroid pathology: the patient reports that she has underlying thyroid pathology, but does not recall if this is related to hyperthyroidism or hypothyroidism. She reports that she was just  started on thyroid medication by her PCP approximately 2 weeks ago, but does not recall the specific name of this medication.  Plan: Check TSH and free T4.  Inpatient pharmacy consult placed for assistance with outpatient medication reconciliation. Will attempt additional chart review, particularly reviewing most recent PCP documentation in order to try to gain further insight into the nature of patient's underlying thyroid pathology as well as the nature of reported recent pharmacologic intervention thereof.      #) Type 2 diabetes mellitus:  The patient has a documented history of type 2 diabetes mellitus, which appears to be managed via lifestyle modifications.  She is currently on no hypoglycemic agents nor does she require any exogenous insulin at home.  Blood sugar per presenting CMP noted to be elevated to 233, which is suspected to be a consequence of physiologic stress stemming from presenting as representing true suboptimal glycemic management as an outpatient.   Plan: Check hemoglobin A1c.  Accu-Cheks every 6 hours with low-dose sliding scale insulin.    DVT prophylaxis: SCDs Code Status: full code Family Communication: None Disposition Plan: Per Rounding Team Consults called: Patient's case imaging were discussed with the on-call general surgeon, Dr. Leighton Ruff, as further detailed above. Admission status: Inpatient; med telemetry.   PLEASE NOTE THAT DRAGON DICTATION SOFTWARE WAS USED IN THE CONSTRUCTION OF THIS NOTE.   [facility] Pager [phone] From [place].   Otherwise, please contact night-coverage  [email or web address] Password [facility]  06/26/2019, 7:06 PM

## 2019-06-26 NOTE — Consult Note (Signed)
Reason for Consult: sbo Referring Physician: Dr Hester Mates is an 84 y.o. female.  HPI: 84 yo female with PMH of pelvic carcinoid tumor s/p resection in 2019 who presents with 1w hx of abdominal pain and nausea. Abdominal pain is primarily in the lower quadrants and is constant in nature. She notes that she is able to keep fluids down but has felt nauseated fairly consistently over the past week. Denies fevers, chills, diarrhea or constipation.  Last Bm was earlier today.  Past Medical History:  Diagnosis Date  . Diabetes mellitus without complication ([place])   . High cholesterol   . Hypertension   . Thyroid disease     Past Surgical History:  Procedure Laterality Date  . ABDOMINAL HYSTERECTOMY    . EYE SURGERY Bilateral 06/2016   Cataract  . KNEE SURGERY Right   . LAPAROTOMY N/A 08/31/2017   Procedure: EXPLORATORY LAPAROTOMY; BILATERAL SALPINGOOPHERECTOMY; OMENTECTOMY, TUMOR DEBULKING;  Surgeon: Everitt Amber, MD;  Location: [facility];  Service: Gynecology;  Laterality: N/A;  . MASS EXCISION N/A 08/31/2017   Procedure: RESECTION OF PELVIC MASS;  Surgeon: Everitt Amber, MD;  Location: [facility];  Service: Gynecology;  Laterality: N/A;  . RIGHT HEART CATH N/A 02/11/2019   Procedure: RIGHT HEART CATH;  Surgeon: Nigel Mormon, MD;  Location: [facility];  Service: Cardiovascular;  Laterality: N/A;    Family History  Problem Relation Age of Onset  . Heart disease Mother   . Diabetes Mother   . Kidney failure Father   . Breast cancer Neg Hx     Social History:  reports that she has never smoked. She has never used smokeless tobacco. She reports that she does not drink alcohol or use drugs.  Allergies: No Known Allergies  Medications: I have reviewed the patient's current medications.  Results for orders placed or performed during the hospital encounter of 06/26/19 (from the past 48 hour(s))  Lipase, blood     Status: None   Collection Time: 06/26/19  1:52 Murphy  Result  Value Ref Range   Lipase 38 11 - 51 U/L    Comment: Performed at [facility], [place] [address], [place], [place] [address]  Comprehensive metabolic panel     Status: Abnormal   Collection Time: 06/26/19  1:52 Murphy  Result Value Ref Range   Sodium 136 135 - 145 mmol/L   Potassium 3.7 3.5 - 5.1 mmol/L   Chloride 88 (L) 98 - 111 mmol/L   CO2 28 22 - 32 mmol/L   Glucose, Bld 233 (H) 70 - 99 mg/dL   BUN 48 (H) 8 - 23 mg/dL   Creatinine, Ser 2.52 (H) 0.44 - 1.00 mg/dL   Calcium 10.2 8.9 - 10.3 mg/dL   Total Protein 9.6 (H) 6.5 - 8.1 g/dL   Albumin 3.8 3.5 - 5.0 g/dL   AST 31 15 - 41 U/L   ALT 22 0 - 44 U/L   Alkaline Phosphatase 85 38 - 126 U/L   Total Bilirubin 1.3 (H) 0.3 - 1.2 mg/dL   GFR calc non Af Amer 17 (L) >60 mL/min   GFR calc Af Amer 20 (L) >60 mL/min   Anion gap 20 (H) 5 - 15    Comment: Performed at [facility], [place] [address] [place], [place] [address]  CBC     Status: Abnormal   Collection Time: 06/26/19  1:52 Murphy  Result Value Ref Range   WBC 6.4 4.0 -  10.5 K/uL   RBC 5.63 (H) 3.87 - 5.11 MIL/uL   Hemoglobin 14.5 12.0 - 15.0 g/dL   HCT 44.6 36.0 - 46.0 %   MCV 79.2 (L) 80.0 - 100.0 fL   MCH 25.8 (L) 26.0 - 34.0 pg   MCHC 32.5 30.0 - 36.0 g/dL   RDW 16.8 (H) 11.5 - 15.5 %   Platelets 254 150 - 400 K/uL   nRBC 0.0 0.0 - 0.2 %    Comment: Performed at [facility], [place] [address] [place], [place] [address]  Troponin I (High Sensitivity)     Status: Abnormal   Collection Time: 06/26/19  1:52 Murphy  Result Value Ref Range   Troponin I (High Sensitivity) 27 (H) <18 ng/L    Comment: (NOTE) Elevated high sensitivity troponin I (hsTnI) values and significant  changes across serial measurements may suggest ACS but many other  chronic and acute conditions are known to elevate hsTnI results.  Refer to the "Links" section for chest pain algorithms and additional  guidance. Performed at [facility],  [place] [address] [place], [place] [address]   TSH     Status: Abnormal   Collection Time: 06/26/19  3:30 Murphy  Result Value Ref Range   TSH 0.027 (L) 0.350 - 4.500 uIU/mL    Comment: Performed by a 3rd Generation assay with a functional sensitivity of <=0.01 uIU/mL. Performed at [facility], [place] [address], [place], [place] [address]   Troponin I (High Sensitivity)     Status: Abnormal   Collection Time: 06/26/19  3:30 Murphy  Result Value Ref Range   Troponin I (High Sensitivity) 27 (H) <18 ng/L    Comment: (NOTE) Elevated high sensitivity troponin I (hsTnI) values and significant  changes across serial measurements may suggest ACS but many other  chronic and acute conditions are known to elevate hsTnI results.  Refer to the "Links" section for chest pain algorithms and additional  guidance. Performed at [facility], [place] [address], [place],  [address]     DG Chest Port 1 View  Result Date: 06/26/2019 CLINICAL DATA:  Shortness of breath EXAM: PORTABLE CHEST 1 VIEW COMPARISON:  06/03/2010 FINDINGS: Cardiac shadows within normal limits. The lungs are well aerated bilaterally. No focal infiltrate or sizable effusion is seen. No bony abnormality is noted. IMPRESSION: No active disease. Electronically Signed   By: Inez Catalina M.D.   On: 06/26/2019 14:40    Review of Systems  Constitutional: Positive for appetite change. Negative for chills and fever.  HENT: Negative for hearing loss and tinnitus.   Respiratory: Negative for cough and shortness of breath.   Cardiovascular: Negative for chest pain and palpitations.  Gastrointestinal: Positive for abdominal pain and nausea. Negative for vomiting.  Genitourinary: Negative for dysuria, frequency and urgency.  Musculoskeletal: Negative for myalgias.  Skin: Negative for rash.  Neurological: Negative for dizziness and headaches.  All other systems reviewed and are negative.  Blood pressure (!)  123/91, pulse (!) 126, temperature (!) 97.5 F (36.4 C), temperature source Oral, resp. rate 16, SpO2 100 %. Physical Exam  Constitutional: She is oriented to person, place, and time. She appears well-developed and well-nourished. No distress.  HENT:  Head: Normocephalic and atraumatic.  Eyes: Pupils are equal, round, and reactive to light. Conjunctivae and EOM are normal.  Neck: No thyromegaly present.  No masses palpated  Cardiovascular: Normal rate, regular rhythm and intact distal pulses.  Respiratory: Effort normal and breath  sounds normal. No respiratory distress.  GI: Soft. She exhibits distension. She exhibits no mass. There is abdominal tenderness. There is no rebound and no guarding. No hernia.  Musculoskeletal:        General: Normal range of motion.     Cervical back: Normal range of motion and neck supple.     Comments: No tenderness to palpation of upper and lower extremities Normal ROM of upper and lower extremities  Neurological: She is alert and oriented to person, place, and time.  Skin: Skin is warm and dry.  Psychiatric: Judgment normal.    Assessment/Plan: SBO with gastric distention and pneumatosis.  Insert NG for decompression. IV abx Pt needs resuscitation right now.  Will follow closely.     Gloria Murphy [id number], Gloria Murphy

## 2019-06-26 NOTE — Progress Notes (Signed)
PHARMACY NOTE -  [place] has been assisting with dosing of Zosyn for IAI.  Dosage remains stable at 2.25 g IV q8 hr  Pharmacy will sign off, following peripherally for culture results or dose adjustments. Please reconsult if a change in clinical status warrants re-evaluation of dosage.  Reuel Boom, PharmD, BCPS [phone] 06/26/2019, 8:12 PM

## 2019-06-26 NOTE — ED Notes (Signed)
Went to attempt obtaining second set of blood cultures however pt is nauseated and vomiting so unable to obtain at this time. Will check back.

## 2019-06-26 NOTE — ED Triage Notes (Signed)
Patient here from home with complaints of lower abd pain x1 week with nausea/vomiting.

## 2019-06-26 NOTE — Progress Notes (Signed)
Patient has heart monitor 1403 (not 1404)

## 2019-06-26 NOTE — ED Provider Notes (Signed)
[facility]PT Provider Note   CSN: [id number] Arrival date & time: 06/26/19  1338     History Chief Complaint  Patient presents with  . Abdominal Pain  . Nausea  . Emesis    Gloria Murphy is a 84 y.o. female.  84 yo female with PMH of pelvic carcinoid tumor s/p resection in 2019 who presents with 1w hx of abdominal pain and nausea. Abdominal pain is primarily in the lower quadrants and is constant in nature. She notes that she is able to keep fluids down but has felt nauseated fairly consistently over the past week. Denies fevers, chills, diarrhea or constipation. Denies respiratory symptoms. Of note, it appears she was seen in urgent care on 2/15 at which time she was tested for COVID and found negative.     Past Medical History:  Diagnosis Date  . Diabetes mellitus without complication ([place])   . High cholesterol   . Hypertension   . Thyroid disease     Patient Active Problem List   Diagnosis Date Noted  . Essential hypertension 02/22/2019  . Exertional dyspnea 02/04/2019  . Vision disturbance 12/19/2018  . TIA (transient ischemic attack) 12/19/2018  . Malignant carcinoid tumor of unknown primary site ([place]) 10/06/2017  . Pelvic mass 08/31/2017  . Elevated CA-125 08/31/2017  . Pelvic mass in female 08/31/2017    Past Surgical History:  Procedure Laterality Date  . ABDOMINAL HYSTERECTOMY    . EYE SURGERY Bilateral 06/2016   Cataract  . KNEE SURGERY Right   . LAPAROTOMY N/A 08/31/2017   Procedure: EXPLORATORY LAPAROTOMY; BILATERAL SALPINGOOPHERECTOMY; OMENTECTOMY, TUMOR DEBULKING;  Surgeon: Everitt Amber, MD;  Location: [facility];  Service: Gynecology;  Laterality: N/A;  . MASS EXCISION N/A 08/31/2017   Procedure: RESECTION OF PELVIC MASS;  Surgeon: Everitt Amber, MD;  Location: [facility];  Service: Gynecology;  Laterality: N/A;  . RIGHT HEART CATH N/A 02/11/2019   Procedure: RIGHT HEART CATH;  Surgeon: Nigel Mormon, MD;  Location: [facility];  Service: Cardiovascular;  Laterality: N/A;     OB History   No obstetric history on file.     Family History  Problem Relation Age of Onset  . Heart disease Mother   . Diabetes Mother   . Kidney failure Father   . Breast cancer Neg Hx     Social History   Tobacco Use  . Smoking status: Never Smoker  . Smokeless tobacco: Never Used  Substance Use Topics  . Alcohol use: No  . Drug use: No    Home Medications Prior to Admission medications   Medication Sig Start Date End Date Taking? Authorizing Provider  Calcium Carbonate (CALTRATE 600 PO) Take 1 tablet by mouth daily.   Yes [provider]  carboxymethylcellulose (REFRESH TEARS) 0.5 % SOLN Place 1 drop into both eyes 3 (three) times daily.   Yes [provider]  megestrol (MEGACE ES) 625 MG/5ML suspension Take 625 mg by mouth daily. 05/30/19  Yes [provider]  ondansetron (ZOFRAN) 4 MG tablet Take 1 tablet (4 mg total) by mouth every 6 (six) hours. 06/24/19  Yes Faustino Congress, NP  aspirin EC 81 MG tablet Take 1 tablet (81 mg total) by mouth daily. Patient not taking: Reported on 06/26/2019 10/26/18   Miquel Dunn, NP  omeprazole (PRILOSEC) 20 MG capsule Take 1 capsule (20 mg total) by mouth daily. Patient not taking: Reported on 06/26/2019 10/25/17   Janith Lima, PA-C  Allergies    Patient has no known allergies.  Review of Systems   Review of Systems  Constitutional: Negative for chills and fever.  HENT: Negative.   Respiratory: Negative.   Cardiovascular: Negative.   Gastrointestinal: Positive for abdominal pain, nausea and vomiting. Negative for blood in stool, constipation and diarrhea.  Genitourinary: Positive for dysuria and flank pain.  Skin: Negative.   Neurological: Negative.   Psychiatric/Behavioral: Negative.     Physical Exam Updated Vital Signs BP (!) 123/91   Pulse (!) 126   Temp (!) 97.5 F (36.4 C) (Oral)   Resp 16   SpO2 100%    Physical Exam Constitutional:      Appearance: She is not toxic-appearing.  HENT:     Head: Atraumatic.  Cardiovascular:     Rate and Rhythm: Regular rhythm. Tachycardia present.  Pulmonary:     Effort: Pulmonary effort is normal.     Breath sounds: Normal breath sounds.  Abdominal:     General: Abdomen is flat.  Neurological:     Mental Status: She is alert.     ED Results / Procedures / Treatments   Labs (all labs ordered are listed, but only abnormal results are displayed) Labs Reviewed  COMPREHENSIVE METABOLIC PANEL - Abnormal; Notable for the following components:      Result Value   Chloride 88 (*)    Glucose, Bld 233 (*)    BUN 48 (*)    Creatinine, Ser 2.52 (*)    Total Protein 9.6 (*)    Total Bilirubin 1.3 (*)    GFR calc non Af Amer 17 (*)    GFR calc Af Amer 20 (*)    Anion gap 20 (*)    All other components within normal limits  CBC - Abnormal; Notable for the following components:   RBC 5.63 (*)    MCV 79.2 (*)    MCH 25.8 (*)    RDW 16.8 (*)    All other components within normal limits  TROPONIN I (HIGH SENSITIVITY) - Abnormal; Notable for the following components:   Troponin I (High Sensitivity) 27 (*)    All other components within normal limits  TROPONIN I (HIGH SENSITIVITY) - Abnormal; Notable for the following components:   Troponin I (High Sensitivity) 27 (*)    All other components within normal limits  LIPASE, BLOOD  URINALYSIS, ROUTINE W REFLEX MICROSCOPIC  TSH  T4, FREE    EKG EKG Interpretation  Date/Time:  Wednesday June 26 2019 14:46:15 EST Ventricular Rate:  108 PR Interval:    QRS Duration: 80 QT Interval:  243 QTC Calculation: 326 R Axis:   15 Text Interpretation: Sinus tachycardia Right atrial enlargement Repol abnrm, severe global ischemia (LM/MVD) Baseline wander in lead(s) V6 Since last tracing rate slower Confirmed by Wandra Arthurs [phone]) on 06/26/2019 4:14:09 PM   Radiology DG Chest Port 1 View  Result  Date: 06/26/2019 CLINICAL DATA:  Shortness of breath EXAM: PORTABLE CHEST 1 VIEW COMPARISON:  06/03/2010 FINDINGS: Cardiac shadows within normal limits. The lungs are well aerated bilaterally. No focal infiltrate or sizable effusion is seen. No bony abnormality is noted. IMPRESSION: No active disease. Electronically Signed   By: Inez Catalina M.D.   On: 06/26/2019 14:40    Medications Ordered in ED Medications  sodium chloride flush (NS) 0.9 % injection 3 mL ( Intravenous Canceled Entry 06/26/19 1407)  sodium chloride 0.9 % bolus 1,000 mL (0 mLs Intravenous Stopped 06/26/19 1443)  ondansetron (ZOFRAN) injection  4 mg (4 mg Intravenous Given 06/26/19 1451)  sodium chloride 0.9 % bolus 1,000 mL (0 mLs Intravenous Stopped 06/26/19 1558)  metoCLOPramide (REGLAN) injection 10 mg (10 mg Intravenous Given 06/26/19 1555)  diphenhydrAMINE (BENADRYL) injection 12.5 mg (12.5 mg Intravenous Given 06/26/19 1555)    ED Course  I have reviewed the triage vital signs and the nursing notes.  Pertinent labs & imaging results that were available during my care of the patient were reviewed by me and considered in my medical decision making (see chart for details).    MDM Rules/Calculators/A&P                      4:47 PM initial assessment along with chart review concerning for thyrotoxicosis vs dehydration vs sepsis vs pyelonephritis. EKG significant for some ST depression in anterolateral leads.   4:47 PM labs significant for AKI which is likely attributable to dehydration from her nausea. No white count to suggest infectious process. First troponin is mildly elevated at 27. I suspect this is demand ischemia however will obtain second troponin for further evaluation. zofran ordered for nausea. Will obtain renal stone CT study to r/o nephrolithiasis and will be able to assess aorta to evaluate for dissection as well.  4:47 PM chest xray unremarkable  4:47 PM CT revealing SBO. NG order placed. Most likely explains  her significant abdominal pain and vomiting. Discussed results with patient. Surgery consult. Will admit for further management. Troponin flat 27>>27.  Final Clinical Impression(s) / ED Diagnoses Final diagnoses:  SBO (small bowel obstruction) (H[facility])  Abdominal pain, unspecified abdominal location  Nausea and vomiting, intractability of vomiting not specified, unspecified vomiting type    Rx / DC Orders ED Discharge Orders    None       Mitzi Hansen, MD 06/26/19 1649    Drenda Freeze, MD 06/27/19 [phone]

## 2019-06-27 DIAGNOSIS — R000 Tachycardia, unspecified: Secondary | ICD-10-CM

## 2019-06-27 LAB — COMPREHENSIVE METABOLIC PANEL
ALT: 19 U/L (ref 0–44)
AST: 25 U/L (ref 15–41)
Albumin: 3 g/dL — ABNORMAL LOW (ref 3.5–5.0)
Alkaline Phosphatase: 71 U/L (ref 38–126)
Anion gap: 17 — ABNORMAL HIGH (ref 5–15)
BUN: 51 mg/dL — ABNORMAL HIGH (ref 8–23)
CO2: 27 mmol/L (ref 22–32)
Calcium: 9.3 mg/dL (ref 8.9–10.3)
Chloride: 95 mmol/L — ABNORMAL LOW (ref 98–111)
Creatinine, Ser: 2.15 mg/dL — ABNORMAL HIGH (ref 0.44–1.00)
GFR calc Af Amer: 24 mL/min — ABNORMAL LOW (ref >60)
GFR calc non Af Amer: 21 mL/min — ABNORMAL LOW (ref >60)
Glucose, Bld: 197 mg/dL — ABNORMAL HIGH (ref 70–99)
Potassium: 3.7 mmol/L (ref 3.5–5.1)
Sodium: 139 mmol/L (ref 135–145)
Total Bilirubin: 2.3 mg/dL — ABNORMAL HIGH (ref 0.3–1.2)
Total Protein: 7.8 g/dL (ref 6.5–8.1)

## 2019-06-27 LAB — HEMOGLOBIN A1C
Hgb A1c MFr Bld: 6.3 % — ABNORMAL HIGH (ref 4.8–5.6)
Mean Plasma Glucose: 134.11 mg/dL

## 2019-06-27 LAB — CBC
HCT: 44.2 % (ref 36.0–46.0)
Hemoglobin: 14.2 g/dL (ref 12.0–15.0)
MCH: 26 pg (ref 26.0–34.0)
MCHC: 32.1 g/dL (ref 30.0–36.0)
MCV: 80.8 fL (ref 80.0–100.0)
Platelets: 167 10*3/uL (ref 150–400)
RBC: 5.47 MIL/uL — ABNORMAL HIGH (ref 3.87–5.11)
RDW: 16.9 % — ABNORMAL HIGH (ref 11.5–15.5)
WBC: 5.7 10*3/uL (ref 4.0–10.5)
nRBC: 0 % (ref 0.0–0.2)

## 2019-06-27 LAB — GLUCOSE, CAPILLARY
Glucose-Capillary: 189 mg/dL — ABNORMAL HIGH (ref 70–99)
Glucose-Capillary: 190 mg/dL — ABNORMAL HIGH (ref 70–99)
Glucose-Capillary: 137 mg/dL — ABNORMAL HIGH (ref 70–99)
Glucose-Capillary: 146 mg/dL — ABNORMAL HIGH (ref 70–99)

## 2019-06-27 LAB — PROTIME-INR
INR: 1 (ref 0.8–1.2)
Prothrombin Time: 13.5 seconds (ref 11.4–15.2)

## 2019-06-27 LAB — TROPONIN I (HIGH SENSITIVITY): Troponin I (High Sensitivity): 26 ng/L — ABNORMAL HIGH (ref <18)

## 2019-06-27 LAB — MAGNESIUM: Magnesium: 2.3 mg/dL (ref 1.7–2.4)

## 2019-06-27 LAB — LACTIC ACID, PLASMA: Lactic Acid, Venous: 1.6 mmol/L (ref 0.5–1.9)

## 2019-06-27 MED ORDER — ORAL CARE MOUTH RINSE
15.0000 mL | Freq: Two times a day (BID) | OROMUCOSAL | Status: DC
  Administered 2019-06-27 – 2019-07-04 (×12): 15 mL via OROMUCOSAL

## 2019-06-27 MED ORDER — PROMETHAZINE HCL 25 MG/ML IJ SOLN
6.2500 mg | Freq: Four times a day (QID) | INTRAMUSCULAR | Status: DC | PRN
  Administered 2019-07-04 – 2019-07-12 (×2): 6.25 mg via INTRAVENOUS
  Filled 2019-06-27 (×2): qty 1

## 2019-06-27 MED ORDER — PIPERACILLIN-TAZOBACTAM 3.375 G IVPB
3.3750 g | Freq: Three times a day (TID) | INTRAVENOUS | Status: DC
  Administered 2019-06-27 – 2019-06-29 (×7): 3.375 g via INTRAVENOUS
  Filled 2019-06-27 (×8): qty 50

## 2019-06-27 MED ORDER — SODIUM CHLORIDE 0.9 % IV BOLUS
500.0000 mL | Freq: Once | INTRAVENOUS | Status: AC
  Administered 2019-06-27: 13:00:00 500 mL via INTRAVENOUS

## 2019-06-27 NOTE — Progress Notes (Signed)
PROGRESS NOTE    Gloria Murphy  [id number] DOB: Oct 08, 1935 DOA: 06/26/2019 PCP: Carlyle Basques, MD     Brief Narrative:  84 year old woman admitted from home on 2/17 due to nausea and vomiting with decreased p.o. intake.  In the ED she was found to have a large small bowel obstruction and gastric distention.  She was seen by surgery in the ED and NG tube was placed.  She has a history of a pelvic carcinoid tumor for which she underwent exploratory laparotomy in April 2019 with bilateral salpingo-oophorectomy and omentectomy as well as partial hysterectomy.   Assessment & Plan:   Principal Problem:   SBO (small bowel obstruction) (HCC) Active Problems:   Malignant carcinoid tumor of unknown primary site [facility])   Type 2 diabetes mellitus without complication, without long-term current use of insulin (HCC)   AKI (acute kidney injury) (HCC)   Nausea and vomiting   Abdominal pain   Lactic acidosis   Elevated troponin   Sinus tachycardia   Small bowel obstruction -Decreased abdominal pain and distention this a.m. with NG tube, she has had large volume NG output with close to 4000 cc, despite that she had some low volume emesis around the NG tube. -Surgery is following, plan for repeat abdominal film in a.m. -Continue NG suction.  -Continue IV fluids.   Sinus tachycardia -Suspect intravascular volume depletion due to high volume NG output. -Will give IV fluid bolus today and follow.  And continue maintenance fluids at 100 cc an hour.  Heart rate is currently in the 110s to 120s.  Pelvic carcinoid tumor -Continue follow-up with GYN oncology, Dr. Denman George as an outpatient.  Acute renal failure -Likely due to intravascular volume depletion and acute tubular necrosis. -Creatinine on admission was 2.52, is down to 2.15 today.  Type 2 diabetes -Manage at home with diet and exercise. -Continue sensitive sliding scale.    DVT prophylaxis: SCDs Code Status: Full code Family  Communication: Patient only Disposition Plan: Anticipate will need inpatient at least 48 to 72 hours  Consultants:   Surgery  Procedures:   NG tube placement 2/17  Antimicrobials:  Anti-infectives (From admission, onward)   Start     Dose/Rate Route Frequency Ordered Stop   06/27/19 1000  piperacillin-tazobactam (ZOSYN) IVPB 3.375 g     3.375 g 12.5 mL/hr over 240 Minutes Intravenous Every 8 hours 06/27/19 0807     06/27/19 0200  piperacillin-tazobactam (ZOSYN) IVPB 2.25 g  Status:  Discontinued     2.25 g 100 mL/hr over 30 Minutes Intravenous Every 8 hours 06/26/19 2012 06/27/19 0807   06/26/19 1700  piperacillin-tazobactam (ZOSYN) IVPB 3.375 g     3.375 g 100 mL/hr over 30 Minutes Intravenous  Once 06/26/19 1651 06/26/19 1823       Subjective: Lying in bed, states no more abdominal pain, less bloating but she did have some emesis around her NG tube this a.m.  Objective: Vitals:   06/26/19 2030 06/26/19 2057 06/26/19 2057 06/27/19 0406  BP: (!) 152/87  135/79 134/89  Pulse: (!) 119  100 (!) 110  Resp: (!) 23  20 18   Temp:   98.9 F (37.2 C) 98.3 F (36.8 C)  TempSrc:   Oral Oral  SpO2: 100%  100% 100%  Weight:  66.1 kg    Height:  5\' 9"  (1.753 m)      Intake/Output Summary (Last 24 hours) at 06/27/2019 1113 Last data filed at 06/27/2019 0630 Gross per 24 hour  Intake 2286.49  ml  Output 3900 ml  Net -1613.51 ml   Filed Weights   06/26/19 2057  Weight: 66.1 kg    Examination:  General exam: Alert, awake, oriented x 3 Respiratory system: Clear to auscultation. Respiratory effort normal. Cardiovascular system: Tachycardic, regular rhythm. No murmurs, rubs, gallops. Gastrointestinal system: NG tube is in place abdomen is nondistended, soft and nontender. No organomegaly or masses felt. Normal bowel sounds heard. Central nervous system: Alert and oriented. No focal neurological deficits. Extremities: No C/C/E, +pedal pulses Skin: No rashes, lesions or  ulcers Psychiatry: Judgement and insight appear normal. Mood & affect appropriate.     Data Reviewed: I have personally reviewed following labs and imaging studies  CBC: Recent Labs  Lab 06/26/19 1352 06/27/19 0316  WBC 6.4 5.7  HGB 14.5 14.2  HCT 44.6 44.2  MCV 79.2* 80.8  PLT 254 [id number]   Basic Metabolic Panel: Recent Labs  Lab 06/26/19 1352 06/26/19 2107 06/27/19 0316  NA 136  --  139  K 3.7  --  3.7  CL 88*  --  95*  CO2 28  --  27  GLUCOSE 233*  --  197*  BUN 48*  --  51*  CREATININE 2.52*  --  2.15*  CALCIUM 10.2  --  9.3  MG  --  2.3 2.3   GFR: Estimated Creatinine Clearance: 21.4 mL/min (A) (by C-G formula based on SCr of 2.15 mg/dL (H)). Liver Function Tests: Recent Labs  Lab 06/26/19 1352 06/27/19 0316  AST 31 25  ALT 22 19  ALKPHOS 85 71  BILITOT 1.3* 2.3*  PROT 9.6* 7.8  ALBUMIN 3.8 3.0*   Recent Labs  Lab 06/26/19 1352  LIPASE 38   No results for input(s): AMMONIA in the last 168 hours. Coagulation Profile: Recent Labs  Lab 06/27/19 0316  INR 1.0   Cardiac Enzymes: No results for input(s): CKTOTAL, CKMB, CKMBINDEX, TROPONINI in the last 168 hours. BNP (last 3 results) No results for input(s): PROBNP in the last 8760 hours. HbA1C: Recent Labs    06/27/19 0316  HGBA1C 6.3*   CBG: Recent Labs  Lab 06/27/19 0241 06/27/19 0545  GLUCAP 189* 190*   Lipid Profile: No results for input(s): CHOL, HDL, LDLCALC, TRIG, CHOLHDL, LDLDIRECT in the last 72 hours. Thyroid Function Tests: Recent Labs    06/26/19 1530  TSH 0.027*  FREET4 1.48*   Anemia Panel: No results for input(s): VITAMINB12, FOLATE, FERRITIN, TIBC, IRON, RETICCTPCT in the last 72 hours. Urine analysis:    Component Value Date/Time   COLORURINE YELLOW 08/28/2017 1057   APPEARANCEUR HAZY (A) 08/28/2017 1057   LABSPEC 1.020 10/25/2017 1931   PHURINE 5.5 10/25/2017 1931   GLUCOSEU NEGATIVE 10/25/2017 1931   HGBUR NEGATIVE 10/25/2017 1931   BILIRUBINUR NEGATIVE  10/25/2017 1931   KETONESUR NEGATIVE 10/25/2017 1931   PROTEINUR NEGATIVE 10/25/2017 1931   UROBILINOGEN 0.2 10/25/2017 1931   NITRITE NEGATIVE 10/25/2017 1931   LEUKOCYTESUR SMALL (A) 10/25/2017 1931   Sepsis Labs: @LABRCNTIP (procalcitonin:4,lacticidven:4)  ) Recent Results (from the past 240 hour(s))  Novel Coronavirus, NAA (Hosp order, Send-out to Ref Lab; TAT 18-24 hrs     Status: None   Collection Time: 06/24/19  9:16 AM   Specimen: Nasopharyngeal Swab; Respiratory  Result Value Ref Range Status   SARS-CoV-2, NAA NOT DETECTED NOT DETECTED Final    Comment: (NOTE) This nucleic acid amplification test was developed and its performance characteristics determined by [employer]. Nucleic acid amplification tests include RT-PCR and TMA. This  test has not been FDA cleared or approved. This test has been authorized by FDA under an Emergency Use Authorization (EUA). This test is only authorized for the duration of time the declaration that circumstances exist justifying the authorization of the emergency use of in vitro diagnostic tests for detection of SARS-CoV-2 virus and/or diagnosis of COVID-19 infection under section 564(b)(1) of the Act, 21 U.S.C. [id number]) (1), unless the authorization is terminated or revoked sooner. When diagnostic testing is negative, the possibility of a false negative result should be considered in the context of a patient's recent exposures and the presence of clinical signs and symptoms consistent with COVID-19. An individual without symptoms of COVID- 19 and who is not shedding SARS-CoV-2  virus would expect to have a negative (not detected) result in this assay. Performed At: [facility] [address] [place], [place] [id number] Rush Farmer MD [id number]    [place]  Final    Comment: Performed at [facility], [place] [address], [place], [place] [address]  Blood culture (routine x 2)      Status: None (Preliminary result)   Collection Time: 06/26/19  5:33 PM   Specimen: BLOOD  Result Value Ref Range Status   Specimen Description   Final    BLOOD LEFT ANTECUBITAL Performed at [facility] [address] [place], [place] [address]    Special Requests   Final    BOTTLES DRAWN AEROBIC AND ANAEROBIC Blood Culture adequate volume Performed at [facility] [address] [place], [place] [address]    Culture   Final    NO GROWTH < 12 HOURS Performed at [facility] [address], [place],  [address]    Report Status PENDING  Incomplete  Respiratory Panel by RT PCR (Flu A&B, Covid) - Nasopharyngeal Swab     Status: None   Collection Time: 06/26/19  6:07 PM   Specimen: Nasopharyngeal Swab  Result Value Ref Range Status   SARS Coronavirus 2 by RT PCR NEGATIVE NEGATIVE Final    Comment: (NOTE) SARS-CoV-2 target nucleic acids are NOT DETECTED. The SARS-CoV-2 RNA is generally detectable in upper respiratoy specimens during the acute phase of infection. The lowest concentration of SARS-CoV-2 viral copies this assay can detect is 131 copies/mL. A negative result does not preclude SARS-Cov-2 infection and should not be used as the sole basis for treatment or other patient management decisions. A negative result may occur with  improper specimen collection/handling, submission of specimen other than nasopharyngeal swab, presence of viral mutation(s) within the areas targeted by this assay, and inadequate number of viral copies (<131 copies/mL). A negative result must be combined with clinical observations, patient history, and epidemiological information. The expected result is Negative. Fact Sheet for Patients:  PinkCheek.be Fact Sheet for Healthcare Providers:  GravelBags.it This test is not yet ap proved or cleared by the [place] FDA and  has been  authorized for detection and/or diagnosis of SARS-CoV-2 by FDA under an Emergency Use Authorization (EUA). This EUA will remain  in effect (meaning this test can be used) for the duration of the COVID-19 declaration under Section 564(b)(1) of the Act, 21 U.S.C. section 360bbb-3(b)(1), unless the authorization is terminated or revoked sooner.    Influenza A by PCR NEGATIVE NEGATIVE Final   Influenza B by PCR NEGATIVE NEGATIVE Final    Comment: (NOTE) The Xpert Xpress SARS-CoV-2/FLU/RSV assay is intended as an aid in  the diagnosis of influenza from  Nasopharyngeal swab specimens and  should not be used as a sole basis for treatment. Nasal washings and  aspirates are unacceptable for Xpert Xpress SARS-CoV-2/FLU/RSV  testing. Fact Sheet for Patients: PinkCheek.be Fact Sheet for Healthcare Providers: GravelBags.it This test is not yet approved or cleared by the [place] FDA and  has been authorized for detection and/or diagnosis of SARS-CoV-2 by  FDA under an Emergency Use Authorization (EUA). This EUA will remain  in effect (meaning this test can be used) for the duration of the  Covid-19 declaration under Section 564(b)(1) of the Act, 21  U.S.C. section 360bbb-3(b)(1), unless the authorization is  terminated or revoked. Performed at [facility], [place] [address] [place], [place] [address]   Blood culture (routine x 2)     Status: None (Preliminary result)   Collection Time: 06/26/19  6:12 PM   Specimen: Site Not Specified; Blood  Result Value Ref Range Status   Specimen Description   Final    SITE NOT SPECIFIED Performed at [facility] [address], [place], [place] [address]    Special Requests   Final    BOTTLES DRAWN AEROBIC AND ANAEROBIC Blood Culture adequate volume Performed at [facility] [address] [place], [place] [address]    Culture   Final     NO GROWTH < 12 HOURS Performed at [facility] [address] [place], [place] [address]    Report Status PENDING  Incomplete         Radiology Studies: DG Chest Port 1 View  Result Date: 06/26/2019 CLINICAL DATA:  Shortness of breath EXAM: PORTABLE CHEST 1 VIEW COMPARISON:  06/03/2010 FINDINGS: Cardiac shadows within normal limits. The lungs are well aerated bilaterally. No focal infiltrate or sizable effusion is seen. No bony abnormality is noted. IMPRESSION: No active disease. Electronically Signed   By: Inez Catalina M.D.   On: 06/26/2019 14:40   DG Abd Portable 1 View  Result Date: 06/26/2019 CLINICAL DATA:  NG tube placement EXAM: PORTABLE ABDOMEN - 1 VIEW COMPARISON:  CT 06/26/2019 FINDINGS: Esophageal tube tip overlies the mid gastric region. Incompletely visualized dilated small bowel in the left lower quadrant. Bubbly lucencies in the central abdomen, may correspond to gastric pneumatosis on recent CT IMPRESSION: Esophageal tube tip overlies the mid gastric region Electronically Signed   By: Donavan Foil M.D.   On: 06/26/2019 20:42   CT Renal Stone Study  Result Date: 06/26/2019 CLINICAL DATA:  Flank pain, nausea vomiting history of ovarian carcinoid tumor. EXAM: CT ABDOMEN AND PELVIS WITHOUT CONTRAST TECHNIQUE: Multidetector CT imaging of the abdomen and pelvis was performed following the standard protocol without IV contrast. COMPARISON:  07/27/2017 FINDINGS: Lower chest: Basilar atelectasis though with nodular changes at the left lung base. These are ill-defined. No dense consolidation or effusion. Hepatobiliary: Signs of portal venous gas. Limited assessment of the liver on noncontrast imaging. No signs of biliary ductal dilation. Pancreas: Pancreas displaced by markedly distended stomach. No signs of focal pancreatic lesion. Spleen: Spleen is normal. Adrenals/Urinary Tract: Adrenal glands are normal. No signs of hydronephrosis or nephrolithiasis. Stomach/Bowel: Gastric  distension and small bowel dilation terminating in the distal jejunum in the right lower quadrant anterior to the iliac vasculature. Distal small bowel loops are decompressed. Adjacent to the appendix is a mesenteric nodule with calcification measuring approximately 12 mm. There is mild distension of the distal ileum. Question of filling defect within the distal ileum with 53 Hounsfield unit  density within the terminal ileum at this location. The appendix is normal. Signs of gastric pneumatosis likely the source of portal venous gas. Gas in the wall of the stomach is best seen on image 51 of series 2. There is gas surrounding intraluminal contents of the stomach in other areas potentially intermixed with gastric pneumatosis as well, for instance on image 35 along the lateral stomach. Gas within the extrahepatic portal system is not seen. Vascular/Lymphatic: Calcified atherosclerotic changes throughout the abdominal aorta. No signs of adenopathy in the retroperitoneum. Presumed mesenteric mass or adenopathy in the right lower quadrant (image 53, series 2) this is measured above. Reproductive: Post hysterectomy. No sign of pelvic mass with transition point for small-bowel obstruction in the pelvis. Other: No signs of free air at this time. Small amount of fluid in the pelvis. Musculoskeletal: No signs of acute bone finding or destructive bone process. IMPRESSION: 1. Small-bowel obstruction with marked gastric distension, moderate to marked small bowel distension, associated with gastric pneumatosis and portal venous gas. Surgical consultation is suggested. 2. Constellation of findings likely related to adhesions in the pelvis given appearance of small bowel loops. 3. 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 Subtle filling defect with soft tissue density suggested on today's evaluation. 4. Basilar subtle nodularity may represent early aspiration pneumonitis. Attention  on follow-up. These results were called by telephone at the time of interpretation on 06/26/2019 at 5:11 pm to provider DAVID YAO and Dr. Vanita Panda, Who verbally acknowledged these results. Electronically Signed   By: Zetta Bills M.D.   On: 06/26/2019 17:11        Scheduled Meds: . insulin aspart  0-6 Units Subcutaneous Q6H  . mouth rinse  15 mL Mouth Rinse BID  . sodium chloride flush  3 mL Intravenous Once  . sodium chloride flush  3 mL Intravenous Q12H   Continuous Infusions: . lactated ringers 100 mL/hr at 06/27/19 1002  . piperacillin-tazobactam (ZOSYN)  IV 3.375 g (06/27/19 1057)  . sodium chloride       LOS: 1 day    Time spent: 35 minutes. Greater than 50% of this time was spent in direct contact with the patient, coordinating care and discussing relevant ongoing clinical issues.    Lelon Frohlich, MD [facility] Pager [phone]  If 7PM-7AM, please contact night-coverage [email or web address] Password [facility] 06/27/2019, 11:13 AM

## 2019-06-27 NOTE — Progress Notes (Signed)
Initial Nutrition Assessment  RD working remotely.   DOCUMENTATION CODES:   Not applicable  INTERVENTION:  - diet advancement as medically feasible. - will monitor for needs for nutrition support.    NUTRITION DIAGNOSIS:   Inadequate oral intake related to inability to eat as evidenced by NPO status.  GOAL:   Patient will meet greater than or equal to 90% of their needs  MONITOR:   Diet advancement  REASON FOR ASSESSMENT:   Malnutrition Screening Tool  ASSESSMENT:   84 year old female admitted from home on 2/17 due to N/V and decreased p.o. intake. In the ED she was found to have a SBO and gastric distention. She was seen by surgery in the ED and NG tube was placed. She has a history of a pelvic carcinoid tumor for which she underwent ex lap in 08/2017 with bilateral salpingo-oophorectomy and omentectomy as well as partial hysterectomy.  Patient has been NPO since admission. NGT placed yesterday at [facility] with a total of 775 ml output today. Weight yesterday was 146 lb and weight on 03/11/19 was 168 lb. This indicates 22 lb weight loss (13% body weight) in the past 3.5 months; significant for time frame. Patient has very high risk for malnutrition.   Per notes: - SBO--decreased abdominal pain and decreased output from NGT - Surgery following - pelvic carcinoid tumor--followed by Oncology - ARF thought to be 2/2 ATN   Labs reviewed; CBGs: 189, 190, and 137 mg/dl, Cl: 95 mmol/l, BUN: 51 mg/dl, creatinine: 2.15 mg/dl, GFR: 24 ml/min. Medications reviewed; sliding scale novolog, 10 mg IV reglan x1 dose 2/17. IVF; LR @ 100 ml/hr    NUTRITION - FOCUSED PHYSICAL EXAM:  unable to complete at this time.   Diet Order:   Diet Order            Diet NPO time specified  Diet effective now              EDUCATION NEEDS:   No education needs have been identified at this time  Skin:  Skin Assessment: Reviewed RN Assessment  Last BM:  PTA  Height:   Ht Readings from  Last 1 Encounters:  06/26/19 5\' 9"  (1.753 m)    Weight:   Wt Readings from Last 1 Encounters:  06/26/19 66.1 kg    Ideal Body Weight:  65.9 kg  BMI:  Body mass index is 21.52 kg/m.  Estimated Nutritional Needs:   Kcal:  1700-1900 kcal  Protein:  85-100 grams  Fluid:  >/= 2 L/day      Jarome Matin, MS, RD, LDN, CNSC Inpatient Clinical Dietitian RD pager # available in AMION  After hours/weekend pager # available in [facility]

## 2019-06-27 NOTE — Progress Notes (Signed)
PHARMACY NOTE:  ANTIMICROBIAL RENAL DOSAGE ADJUSTMENT  Current antimicrobial regimen includes a mismatch between antimicrobial dosage and estimated renal function.  As per policy approved by the Pharmacy & Therapeutics and Medical Executive Committees, the antimicrobial dosage will be adjusted accordingly.  Current antimicrobial dosage:  Zosyn 2.25g IV q8  Indication: IAI  Renal Function:  Estimated Creatinine Clearance: 21.4 mL/min (A) (by C-G formula based on SCr of 2.15 mg/dL (H)). []      On intermittent HD, scheduled: []      On CRRT    Antimicrobial dosage has been changed to:  Zosyn 3.375g IV q8 (extended interval infusion)   Additional comments:   Thank you for allowing pharmacy to be a part of this patient's care.  Arlyn Dunning [place], [facility] 06/27/2019 8:06 AM

## 2019-06-27 NOTE — Progress Notes (Signed)
Over 1200 ml of dark brown NG output from 7a-7p. Pt. Is very drowsy. HR up to 150's-160's when pt gets out of bed.

## 2019-06-27 NOTE — Progress Notes (Signed)
[facility] Progress Note     Subjective: CC-  Less pain in abdomen since admission, but still feels nauseated and has vomited low volume emesis a couple times around NG tube. No flatus or BM. NG tube with 3900cc brown fluid output.  Lactic acid normalized 1.6  ROS: See above, otherwise other systems negative   Objective: Vital signs in last 24 hours: Temp:  [97.5 F (36.4 C)-98.9 F (37.2 C)] 98.3 F (36.8 C) (02/18 0406) Pulse Rate:  [96-146] 110 (02/18 0406) Resp:  [16-24] 18 (02/18 0406) BP: (106-172)/(79-94) 134/89 (02/18 0406) SpO2:  [93 %-100 %] 100 % (02/18 0406) Weight:  [66.1 kg] 66.1 kg (02/17 2057)    Intake/Output from previous day: 02/17 0701 - 02/18 0700 In: 2286.5 [I.V.:236.5; IV Piggyback:2050] Out: 3900 [Emesis/NG output:3900] Intake/Output this shift: No intake/output data recorded.  PE: Gen:  Alert, NAD, pleasant Card:  Mild tachy Pulm:  CTAB, no W/R/R, rate and effort normal Abd: Soft, mild distension, mild global TTP without rebound or guarding, hypoactive BS  Lab Results:  Recent Labs    06/26/19 1352 06/27/19 0316  WBC 6.4 5.7  HGB 14.5 14.2  HCT 44.6 44.2  PLT 254 167   BMET Recent Labs    06/26/19 1352 06/27/19 0316  NA 136 139  K 3.7 3.7  CL 88* 95*  CO2 28 27  GLUCOSE 233* 197*  BUN 48* 51*  CREATININE 2.52* 2.15*  CALCIUM 10.2 9.3   PT/INR Recent Labs    06/27/19 0316  LABPROT 13.5  INR 1.0   CMP     Component Value Date/Time   NA 139 06/27/2019 0316   NA 142 02/26/2019 1618   K 3.7 06/27/2019 0316   CL 95 (L) 06/27/2019 0316   CO2 27 06/27/2019 0316   GLUCOSE 197 (H) 06/27/2019 0316   BUN 51 (H) 06/27/2019 0316   BUN 19 02/26/2019 1618   CREATININE 2.15 (H) 06/27/2019 0316   CALCIUM 9.3 06/27/2019 0316   PROT 7.8 06/27/2019 0316   PROT 7.7 02/04/2019 1719   ALBUMIN 3.0 (L) 06/27/2019 0316   ALBUMIN 4.1 02/04/2019 1719   AST 25 06/27/2019 0316   ALT 19 06/27/2019 0316   ALKPHOS 71  06/27/2019 0316   BILITOT 2.3 (H) 06/27/2019 0316   BILITOT 0.4 02/04/2019 1719   GFRNONAA 21 (L) 06/27/2019 0316   GFRAA 24 (L) 06/27/2019 0316   Lipase     Component Value Date/Time   LIPASE 38 06/26/2019 1352       Studies/Results: DG Chest Port 1 View  Result Date: 06/26/2019 CLINICAL DATA:  Shortness of breath EXAM: PORTABLE CHEST 1 VIEW COMPARISON:  06/03/2010 FINDINGS: Cardiac shadows within normal limits. The lungs are well aerated bilaterally. No focal infiltrate or sizable effusion is seen. No bony abnormality is noted. IMPRESSION: No active disease. Electronically Signed   By: Inez Catalina M.D.   On: 06/26/2019 14:40   DG Abd Portable 1 View  Result Date: 06/26/2019 CLINICAL DATA:  NG tube placement EXAM: PORTABLE ABDOMEN - 1 VIEW COMPARISON:  CT 06/26/2019 FINDINGS: Esophageal tube tip overlies the mid gastric region. Incompletely visualized dilated small bowel in the left lower quadrant. Bubbly lucencies in the central abdomen, may correspond to gastric pneumatosis on recent CT IMPRESSION: Esophageal tube tip overlies the mid gastric region Electronically Signed   By: Donavan Foil M.D.   On: 06/26/2019 20:42   CT Renal Stone Study  Result Date: 06/26/2019 CLINICAL DATA:  Flank pain, nausea  vomiting history of ovarian carcinoid tumor. EXAM: CT ABDOMEN AND PELVIS WITHOUT CONTRAST TECHNIQUE: Multidetector CT imaging of the abdomen and pelvis was performed following the standard protocol without IV contrast. COMPARISON:  07/27/2017 FINDINGS: Lower chest: Basilar atelectasis though with nodular changes at the left lung base. These are ill-defined. No dense consolidation or effusion. Hepatobiliary: Signs of portal venous gas. Limited assessment of the liver on noncontrast imaging. No signs of biliary ductal dilation. Pancreas: Pancreas displaced by markedly distended stomach. No signs of focal pancreatic lesion. Spleen: Spleen is normal. Adrenals/Urinary Tract: Adrenal glands are  normal. No signs of hydronephrosis or nephrolithiasis. Stomach/Bowel: Gastric distension and small bowel dilation terminating in the distal jejunum in the right lower quadrant anterior to the iliac vasculature. Distal small bowel loops are decompressed. Adjacent to the appendix is a mesenteric nodule with calcification measuring approximately 12 mm. There is mild distension of the distal ileum. Question of filling defect within the distal ileum with 53 Hounsfield unit density within the terminal ileum at this location. The appendix is normal. Signs of gastric pneumatosis likely the source of portal venous gas. Gas in the wall of the stomach is best seen on image 51 of series 2. There is gas surrounding intraluminal contents of the stomach in other areas potentially intermixed with gastric pneumatosis as well, for instance on image 35 along the lateral stomach. Gas within the extrahepatic portal system is not seen. Vascular/Lymphatic: Calcified atherosclerotic changes throughout the abdominal aorta. No signs of adenopathy in the retroperitoneum. Presumed mesenteric mass or adenopathy in the right lower quadrant (image 53, series 2) this is measured above. Reproductive: Post hysterectomy. No sign of pelvic mass with transition point for small-bowel obstruction in the pelvis. Other: No signs of free air at this time. Small amount of fluid in the pelvis. Musculoskeletal: No signs of acute bone finding or destructive bone process. IMPRESSION: 1. Small-bowel obstruction with marked gastric distension, moderate to marked small bowel distension, associated with gastric pneumatosis and portal venous gas. Surgical consultation is suggested. 2. Constellation of findings likely related to adhesions in the pelvis given appearance of small bowel loops. 3. 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 Subtle filling defect with soft tissue density suggested on today's evaluation. 4.  Basilar subtle nodularity may represent early aspiration pneumonitis. Attention on follow-up. These results were called by telephone at the time of interpretation on 06/26/2019 at 5:11 pm to provider DAVID YAO and Dr. Vanita Panda, Who verbally acknowledged these results. Electronically Signed   By: Zetta Bills M.D.   On: 06/26/2019 17:11    Anti-infectives: Anti-infectives (From admission, onward)   Start     Dose/Rate Route Frequency Ordered Stop   06/27/19 1000  piperacillin-tazobactam (ZOSYN) IVPB 3.375 g     3.375 g 12.5 mL/hr over 240 Minutes Intravenous Every 8 hours 06/27/19 0807     06/27/19 0200  piperacillin-tazobactam (ZOSYN) IVPB 2.25 g  Status:  Discontinued     2.25 g 100 mL/hr over 30 Minutes Intravenous Every 8 hours 06/26/19 2012 06/27/19 0807   06/26/19 1700  piperacillin-tazobactam (ZOSYN) IVPB 3.375 g     3.375 g 100 mL/hr over 30 Minutes Intravenous  Once 06/26/19 1651 06/26/19 1823       Assessment/Plan DM AKI Lactic acidosis - resolved AKI - improving, Cr 2.15  SBO with gastric distention and pneumatosis - h/o Carcinoid tumor s/p ex lap with bilateral salpingo-oophorectomy, omentectomy and radical debulking for suspected ovarian cancer 08/31/2017 Dr. 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 Strict I&O's. Mobilize.  Repeat film in AM. May consider starting small bowel protocol tomorrow after stent of NG tube decompression.    LOS: 1 day    Wellington Hampshire, [facility] 06/27/2019, 9:55 AM Please see Amion for pager number during day  hours 7:00am-4:30pm

## 2019-06-28 ENCOUNTER — Inpatient Hospital Stay (HOSPITAL_COMMUNITY): Payer: Medicare Other

## 2019-06-28 LAB — CBC
HCT: 43.2 % (ref 36.0–46.0)
Hemoglobin: 13.9 g/dL (ref 12.0–15.0)
MCH: 26.2 pg (ref 26.0–34.0)
MCHC: 32.2 g/dL (ref 30.0–36.0)
MCV: 81.5 fL (ref 80.0–100.0)
Platelets: 177 10*3/uL (ref 150–400)
RBC: 5.3 MIL/uL — ABNORMAL HIGH (ref 3.87–5.11)
RDW: 16.9 % — ABNORMAL HIGH (ref 11.5–15.5)
WBC: 9.6 10*3/uL (ref 4.0–10.5)
nRBC: 0 % (ref 0.0–0.2)

## 2019-06-28 LAB — BASIC METABOLIC PANEL
Anion gap: 14 (ref 5–15)
BUN: 53 mg/dL — ABNORMAL HIGH (ref 8–23)
CO2: 37 mmol/L — ABNORMAL HIGH (ref 22–32)
Calcium: 9.5 mg/dL (ref 8.9–10.3)
Chloride: 93 mmol/L — ABNORMAL LOW (ref 98–111)
Creatinine, Ser: 1.99 mg/dL — ABNORMAL HIGH (ref 0.44–1.00)
GFR calc Af Amer: 27 mL/min — ABNORMAL LOW (ref >60)
GFR calc non Af Amer: 23 mL/min — ABNORMAL LOW (ref >60)
Glucose, Bld: 172 mg/dL — ABNORMAL HIGH (ref 70–99)
Potassium: 3.3 mmol/L — ABNORMAL LOW (ref 3.5–5.1)
Sodium: 144 mmol/L (ref 135–145)

## 2019-06-28 LAB — GLUCOSE, CAPILLARY
Glucose-Capillary: 107 mg/dL — ABNORMAL HIGH (ref 70–99)
Glucose-Capillary: 154 mg/dL — ABNORMAL HIGH (ref 70–99)
Glucose-Capillary: 155 mg/dL — ABNORMAL HIGH (ref 70–99)
Glucose-Capillary: 171 mg/dL — ABNORMAL HIGH (ref 70–99)
Glucose-Capillary: 177 mg/dL — ABNORMAL HIGH (ref 70–99)

## 2019-06-28 LAB — PREALBUMIN: Prealbumin: 16.9 mg/dL — ABNORMAL LOW (ref 18–38)

## 2019-06-28 LAB — T3, FREE: T3, Free: 4.6 pg/mL — ABNORMAL HIGH (ref 2.0–4.4)

## 2019-06-28 MED ORDER — DIATRIZOATE MEGLUMINE & SODIUM 66-10 % PO SOLN
90.0000 mL | Freq: Once | ORAL | Status: AC
  Administered 2019-06-28: 90 mL via NASOGASTRIC
  Filled 2019-06-28: qty 90

## 2019-06-28 MED ORDER — POLYVINYL ALCOHOL 1.4 % OP SOLN
1.0000 [drp] | Freq: Three times a day (TID) | OPHTHALMIC | Status: DC | PRN
  Filled 2019-06-28: qty 15

## 2019-06-28 NOTE — Progress Notes (Signed)
Patient pulled her NG tube out. MD on call Dr. Ninfa Linden paged

## 2019-06-28 NOTE — Care Management Important Message (Signed)
Important Message  Patient Details IM Letter given to Dessa Phi RN Case Manager to present to the Patient Name: Gloria Murphy MRN: [id number] Date of Birth: [birth year]   Medicare Important Message Given:  Yes     Kerin Salen 06/28/2019, 10:53 AM

## 2019-06-28 NOTE — Progress Notes (Signed)
PROGRESS NOTE    Gloria Murphy  [id number] DOB: 10-Jul-1935 DOA: 06/26/2019 PCP: Carlyle Basques, MD     Brief Narrative:  84 year old woman admitted from home on 2/17 due to nausea and vomiting with decreased p.o. intake.  In the ED she was found to have a large small bowel obstruction and gastric distention.  She was seen by surgery in the ED and NG tube was placed.  She has a history of a pelvic carcinoid tumor for which she underwent exploratory laparotomy in April 2019 with bilateral salpingo-oophorectomy and omentectomy as well as partial hysterectomy.   Assessment & Plan:   Principal Problem:   SBO (small bowel obstruction) (HCC) Active Problems:   Malignant carcinoid tumor of unknown primary site [facility])   Type 2 diabetes mellitus without complication, without long-term current use of insulin (HCC)   AKI (acute kidney injury) (HCC)   Nausea and vomiting   Abdominal pain   Lactic acidosis   Elevated troponin   Sinus tachycardia   Small bowel obstruction -Decreased abdominal pain and distention this a.m. with NG tube, continues with large volume NG output with close to 2125 cc over past 24 hours. -Surgery is following. -Repeat abdominal film today shows persistent moderate gastric distention and small bowel loop distention. -Continue NG suction.  -Continue IV fluids.   Sinus tachycardia -Suspect intravascular volume depletion due to high volume NG output. -She received a 500 cc bolus in addition to her maintenance IV fluids on 2/18 due to heart rates that were persistently in the 120s. -Heart rates today and overnight have been in the low 100s.  Pelvic carcinoid tumor -Continue follow-up with GYN oncology, Dr. Denman George as an outpatient.  Acute renal failure -Likely due to intravascular volume depletion and acute tubular necrosis. -Creatinine on admission was 2.52, is down to 1.99 today. -Continue IV fluids  Type 2 diabetes -Managed at home with diet and  exercise. -Continue sensitive sliding scale. -Fair control so far while hospitalized.    DVT prophylaxis: SCDs Code Status: Full code Family Communication: Patient only Disposition Plan: Anticipate will need inpatient at least an additional 48 to 72 hours.  Consultants:   Surgery  Procedures:   NG tube placement 2/17  Antimicrobials:  Anti-infectives (From admission, onward)   Start     Dose/Rate Route Frequency Ordered Stop   06/27/19 1000  piperacillin-tazobactam (ZOSYN) IVPB 3.375 g     3.375 g 12.5 mL/hr over 240 Minutes Intravenous Every 8 hours 06/27/19 0807     06/27/19 0200  piperacillin-tazobactam (ZOSYN) IVPB 2.25 g  Status:  Discontinued     2.25 g 100 mL/hr over 30 Minutes Intravenous Every 8 hours 06/26/19 2012 06/27/19 0807   06/26/19 1700  piperacillin-tazobactam (ZOSYN) IVPB 3.375 g     3.375 g 100 mL/hr over 30 Minutes Intravenous  Once 06/26/19 1651 06/26/19 1823       Subjective: Lying in bed, less abdominal pain, some persistent nausea, no emesis overnight.  Objective: Vitals:   06/27/19 0406 06/27/19 1451 06/27/19 2028 06/28/19 0427  BP: 134/89 (!) 114/96 137/71 (!) 141/78  Pulse: (!) 110 (!) 113 (!) 115 (!) 107  Resp: 18 20 18 18   Temp: 98.3 F (36.8 C) 98.4 F (36.9 C) 98.4 F (36.9 C) 98 F (36.7 C)  TempSrc: Oral Oral  Oral  SpO2: 100% 98% 95% 97%  Weight:      Height:        Intake/Output Summary (Last 24 hours) at 06/28/2019 1149 Last data  filed at 06/28/2019 1024 Gross per 24 hour  Intake 453 ml  Output 1500 ml  Net -1047 ml   Filed Weights   06/26/19 2057  Weight: 66.1 kg    Examination:  General exam: Alert, awake, oriented x 3 Respiratory system: Clear to auscultation. Respiratory effort normal. Cardiovascular system: Tachycardic, regular rhythm. No murmurs, rubs, gallops. Gastrointestinal system: NG tube is in place abdomen is nondistended, soft and nontender. No organomegaly or masses felt. Normal bowel sounds  heard. Central nervous system: Alert and oriented. No focal neurological deficits. Extremities: No C/C/E, +pedal pulses Skin: No rashes, lesions or ulcers Psychiatry: Judgement and insight appear normal. Mood & affect appropriate.     Data Reviewed: I have personally reviewed following labs and imaging studies  CBC: Recent Labs  Lab 06/26/19 1352 06/27/19 0316 06/28/19 0419  WBC 6.4 5.7 9.6  HGB 14.5 14.2 13.9  HCT 44.6 44.2 43.2  MCV 79.2* 80.8 81.5  PLT 254 167 [id number]   Basic Metabolic Panel: Recent Labs  Lab 06/26/19 1352 06/26/19 2107 06/27/19 0316 06/28/19 0419  NA 136  --  139 144  K 3.7  --  3.7 3.3*  CL 88*  --  95* 93*  CO2 28  --  27 37*  GLUCOSE 233*  --  197* 172*  BUN 48*  --  51* 53*  CREATININE 2.52*  --  2.15* 1.99*  CALCIUM 10.2  --  9.3 9.5  MG  --  2.3 2.3  --    GFR: Estimated Creatinine Clearance: 23.1 mL/min (A) (by C-G formula based on SCr of 1.99 mg/dL (H)). Liver Function Tests: Recent Labs  Lab 06/26/19 1352 06/27/19 0316  AST 31 25  ALT 22 19  ALKPHOS 85 71  BILITOT 1.3* 2.3*  PROT 9.6* 7.8  ALBUMIN 3.8 3.0*   Recent Labs  Lab 06/26/19 1352  LIPASE 38   No results for input(s): AMMONIA in the last 168 hours. Coagulation Profile: Recent Labs  Lab 06/27/19 0316  INR 1.0   Cardiac Enzymes: No results for input(s): CKTOTAL, CKMB, CKMBINDEX, TROPONINI in the last 168 hours. BNP (last 3 results) No results for input(s): PROBNP in the last 8760 hours. HbA1C: Recent Labs    06/27/19 0316  HGBA1C 6.3*   CBG: Recent Labs  Lab 06/27/19 0545 06/27/19 1243 06/27/19 1823 06/28/19 0041 06/28/19 0623  GLUCAP 190* 137* 146* 171* 155*   Lipid Profile: No results for input(s): CHOL, HDL, LDLCALC, TRIG, CHOLHDL, LDLDIRECT in the last 72 hours. Thyroid Function Tests: Recent Labs    06/26/19 1530 06/27/19 0316  TSH 0.027*  --   FREET4 1.48*  --   T3FREE  --  4.6*   Anemia Panel: No results for input(s): VITAMINB12,  FOLATE, FERRITIN, TIBC, IRON, RETICCTPCT in the last 72 hours. Urine analysis:    Component Value Date/Time   COLORURINE YELLOW 08/28/2017 1057   APPEARANCEUR HAZY (A) 08/28/2017 1057   LABSPEC 1.020 10/25/2017 1931   PHURINE 5.5 10/25/2017 1931   GLUCOSEU NEGATIVE 10/25/2017 1931   HGBUR NEGATIVE 10/25/2017 1931   BILIRUBINUR NEGATIVE 10/25/2017 1931   KETONESUR NEGATIVE 10/25/2017 1931   PROTEINUR NEGATIVE 10/25/2017 1931   UROBILINOGEN 0.2 10/25/2017 1931   NITRITE NEGATIVE 10/25/2017 1931   LEUKOCYTESUR SMALL (A) 10/25/2017 1931   Sepsis Labs: @LABRCNTIP (procalcitonin:4,lacticidven:4)  ) Recent Results (from the past 240 hour(s))  Novel Coronavirus, NAA (Hosp order, Send-out to Ref Lab; TAT 18-24 hrs     Status: None   Collection Time:  06/24/19  9:16 AM   Specimen: Nasopharyngeal Swab; Respiratory  Result Value Ref Range Status   SARS-CoV-2, NAA NOT DETECTED NOT DETECTED Final    Comment: (NOTE) This nucleic acid amplification test was developed and its performance characteristics determined by [employer]. Nucleic acid amplification tests include RT-PCR and TMA. This test has not been FDA cleared or approved. This test has been authorized by FDA under an Emergency Use Authorization (EUA). This test is only authorized for the duration of time the declaration that circumstances exist justifying the authorization of the emergency use of in vitro diagnostic tests for detection of SARS-CoV-2 virus and/or diagnosis of COVID-19 infection under section 564(b)(1) of the Act, 21 U.S.C. [id number]) (1), unless the authorization is terminated or revoked sooner. When diagnostic testing is negative, the possibility of a false negative result should be considered in the context of a patient's recent exposures and the presence of clinical signs and symptoms consistent with COVID-19. An individual without symptoms of COVID- 19 and who is not shedding SARS-CoV-2  virus would  expect to have a negative (not detected) result in this assay. Performed At: [facility] [address] [place], [place] [id number] Rush Farmer MD [id number]    [place]  Final    Comment: Performed at [facility], [place] [address], [place], [place] [address]  Blood culture (routine x 2)     Status: None (Preliminary result)   Collection Time: 06/26/19  5:33 PM   Specimen: BLOOD  Result Value Ref Range Status   Specimen Description   Final    BLOOD LEFT ANTECUBITAL Performed at [facility] [address], [place], [place] [address]    Special Requests   Final    BOTTLES DRAWN AEROBIC AND ANAEROBIC Blood Culture adequate volume Performed at [facility] [address], [place], [place] [address]    Culture   Final    NO GROWTH 2 DAYS Performed at [facility] [address] [place], [place] [address]    Report Status PENDING  Incomplete  Respiratory Panel by RT PCR (Flu A&B, Covid) - Nasopharyngeal Swab     Status: None   Collection Time: 06/26/19  6:07 PM   Specimen: Nasopharyngeal Swab  Result Value Ref Range Status   SARS Coronavirus 2 by RT PCR NEGATIVE NEGATIVE Final    Comment: (NOTE) SARS-CoV-2 target nucleic acids are NOT DETECTED. The SARS-CoV-2 RNA is generally detectable in upper respiratoy specimens during the acute phase of infection. The lowest concentration of SARS-CoV-2 viral copies this assay can detect is 131 copies/mL. A negative result does not preclude SARS-Cov-2 infection and should not be used as the sole basis for treatment or other patient management decisions. A negative result may occur with  improper specimen collection/handling, submission of specimen other than nasopharyngeal swab, presence of viral mutation(s) within the areas targeted by this assay, and inadequate number of viral copies (<131 copies/mL). A negative result must be combined with  clinical observations, patient history, and epidemiological information. The expected result is Negative. Fact Sheet for Patients:  PinkCheek.be Fact Sheet for Healthcare Providers:  GravelBags.it This test is not yet ap proved or cleared by the [place] FDA and  has been authorized for detection and/or diagnosis of SARS-CoV-2 by FDA under an Emergency Use Authorization (EUA). This EUA will remain  in effect (meaning this test can be used) for the duration of the COVID-19 declaration under Section 564(b)(1) of the Act,  21 U.S.C. section 360bbb-3(b)(1), unless the authorization is terminated or revoked sooner.    Influenza A by PCR NEGATIVE NEGATIVE Final   Influenza B by PCR NEGATIVE NEGATIVE Final    Comment: (NOTE) The Xpert Xpress SARS-CoV-2/FLU/RSV assay is intended as an aid in  the diagnosis of influenza from Nasopharyngeal swab specimens and  should not be used as a sole basis for treatment. Nasal washings and  aspirates are unacceptable for Xpert Xpress SARS-CoV-2/FLU/RSV  testing. Fact Sheet for Patients: PinkCheek.be Fact Sheet for Healthcare Providers: GravelBags.it This test is not yet approved or cleared by the [place] FDA and  has been authorized for detection and/or diagnosis of SARS-CoV-2 by  FDA under an Emergency Use Authorization (EUA). This EUA will remain  in effect (meaning this test can be used) for the duration of the  Covid-19 declaration under Section 564(b)(1) of the Act, 21  U.S.C. section 360bbb-3(b)(1), unless the authorization is  terminated or revoked. Performed at [facility], [place] [address], [place], [place] [address]   Blood culture (routine x 2)     Status: None (Preliminary result)   Collection Time: 06/26/19  6:12 PM   Specimen: Site Not Specified; Blood  Result Value Ref Range Status   Specimen  Description   Final    SITE NOT SPECIFIED Performed at [facility] [address], [place], [place] [address]    Special Requests   Final    BOTTLES DRAWN AEROBIC AND ANAEROBIC Blood Culture adequate volume Performed at [facility] [address], [place], [place] [address]    Culture   Final    NO GROWTH 2 DAYS Performed at [facility] [address], [place], [place] [address]    Report Status PENDING  Incomplete         Radiology Studies: DG Chest Port 1 View  Result Date: 06/26/2019 CLINICAL DATA:  Shortness of breath EXAM: PORTABLE CHEST 1 VIEW COMPARISON:  06/03/2010 FINDINGS: Cardiac shadows within normal limits. The lungs are well aerated bilaterally. No focal infiltrate or sizable effusion is seen. No bony abnormality is noted. IMPRESSION: No active disease. Electronically Signed   By: Inez Catalina M.D.   On: 06/26/2019 14:40   DG Abd Portable 1V-Small Bowel Obstruction Protocol-initial, 8 hr delay  Result Date: 06/28/2019 CLINICAL DATA:  Follow-up small-bowel obstruction. EXAM: PORTABLE ABDOMEN - 1 VIEW COMPARISON:  Prior abdomen same day. Abdomen 06/26/2019. CT 06/26/2019. FINDINGS: NG tube noted stable position with tip in the stomach. Persistent moderate gastric distention. Previously noted gastric pneumatosis best evaluated on prior CT. Distended loop of small bowel noted. Again distended fluid-filled loops may be present as noted on prior CT of 06/26/2019. Colonic gas pattern is normal. Stool in the colon. No free air. Pelvic calcifications consistent phleboliths. Surgical clips in the pelvis. Degenerative changes lumbar spine and both hips. IMPRESSION: 1. NG tube noted stable position of the stomach. Persistent moderate gastric distention. Previously identified gastric pneumatosis again best identified by prior CT. 2. Distended loops of small bowel noted. Again distended fluid-filled loops may be present as noted on prior CT  of 06/26/2019. Colonic gas pattern is normal. Electronically Signed   By: Marcello Moores  Register   On: 06/28/2019 10:32   DG Abd Portable 1V  Result Date: 06/28/2019 CLINICAL DATA:  Small-bowel obstruction. EXAM: PORTABLE ABDOMEN - 1 VIEW COMPARISON:  06/26/2019.  CT 06/26/2019. FINDINGS: NG tube noted in stable position over the stomach. Moderate gastric distention noted  on today's exam. Previously identified gastric pneumatosis best identified by prior recent CT. No bowel distention noted on today's exam. This could be secondary to fluid-filled loops of small bowel, reference made to prior CT report of 06/26/2019. No free air. Small calcifications in pelvis consistent phleboliths. Surgical clips are noted in the pelvis. No acute bony abnormality. IMPRESSION: NG tube noted with its tip over the stomach. Moderate gastric distention on today's exam. Previously identified gastric pneumatosis best identified by prior recent CT. No bowel distention on today's exam. This could be secondary to fluid-filled loops of small bowel, reference is made to prior CT report of 06/26/2019. No free air. Electronically Signed   By: Marcello Moores  Register   On: 06/28/2019 05:49   DG Abd Portable 1 View  Result Date: 06/26/2019 CLINICAL DATA:  NG tube placement EXAM: PORTABLE ABDOMEN - 1 VIEW COMPARISON:  CT 06/26/2019 FINDINGS: Esophageal tube tip overlies the mid gastric region. Incompletely visualized dilated small bowel in the left lower quadrant. Bubbly lucencies in the central abdomen, may correspond to gastric pneumatosis on recent CT IMPRESSION: Esophageal tube tip overlies the mid gastric region Electronically Signed   By: Donavan Foil M.D.   On: 06/26/2019 20:42   CT Renal Stone Study  Result Date: 06/26/2019 CLINICAL DATA:  Flank pain, nausea vomiting history of ovarian carcinoid tumor. EXAM: CT ABDOMEN AND PELVIS WITHOUT CONTRAST TECHNIQUE: Multidetector CT imaging of the abdomen and pelvis was performed following the  standard protocol without IV contrast. COMPARISON:  07/27/2017 FINDINGS: Lower chest: Basilar atelectasis though with nodular changes at the left lung base. These are ill-defined. No dense consolidation or effusion. Hepatobiliary: Signs of portal venous gas. Limited assessment of the liver on noncontrast imaging. No signs of biliary ductal dilation. Pancreas: Pancreas displaced by markedly distended stomach. No signs of focal pancreatic lesion. Spleen: Spleen is normal. Adrenals/Urinary Tract: Adrenal glands are normal. No signs of hydronephrosis or nephrolithiasis. Stomach/Bowel: Gastric distension and small bowel dilation terminating in the distal jejunum in the right lower quadrant anterior to the iliac vasculature. Distal small bowel loops are decompressed. Adjacent to the appendix is a mesenteric nodule with calcification measuring approximately 12 mm. There is mild distension of the distal ileum. Question of filling defect within the distal ileum with 53 Hounsfield unit density within the terminal ileum at this location. The appendix is normal. Signs of gastric pneumatosis likely the source of portal venous gas. Gas in the wall of the stomach is best seen on image 51 of series 2. There is gas surrounding intraluminal contents of the stomach in other areas potentially intermixed with gastric pneumatosis as well, for instance on image 35 along the lateral stomach. Gas within the extrahepatic portal system is not seen. Vascular/Lymphatic: Calcified atherosclerotic changes throughout the abdominal aorta. No signs of adenopathy in the retroperitoneum. Presumed mesenteric mass or adenopathy in the right lower quadrant (image 53, series 2) this is measured above. Reproductive: Post hysterectomy. No sign of pelvic mass with transition point for small-bowel obstruction in the pelvis. Other: No signs of free air at this time. Small amount of fluid in the pelvis. Musculoskeletal: No signs of acute bone finding or  destructive bone process. IMPRESSION: 1. Small-bowel obstruction with marked gastric distension, moderate to marked small bowel distension, associated with gastric pneumatosis and portal venous gas. Surgical consultation is suggested. 2. Constellation of findings likely related to adhesions in the pelvis given appearance of small bowel loops. 3. 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 Subtle filling defect with soft tissue density suggested on today's evaluation. 4. Basilar subtle nodularity may represent early aspiration pneumonitis. Attention on follow-up. These results were called by telephone at the time of interpretation on 06/26/2019 at 5:11 pm to provider DAVID YAO and Dr. Vanita Panda, Who verbally acknowledged these results. Electronically Signed   By: Zetta Bills M.D.   On: 06/26/2019 17:11        Scheduled Meds: . diatrizoate meglumine-sodium  90 mL Per NG tube Once  . insulin aspart  0-6 Units Subcutaneous Q6H  . mouth rinse  15 mL Mouth Rinse BID  . sodium chloride flush  3 mL Intravenous Once  . sodium chloride flush  3 mL Intravenous Q12H   Continuous Infusions: . lactated ringers 100 mL/hr at 06/27/19 1002  . piperacillin-tazobactam (ZOSYN)  IV 3.375 g (06/28/19 0921)     LOS: 2 days    Time spent: 25 minutes. Greater than 50% of this time was spent in direct contact with the patient, coordinating care and discussing relevant ongoing clinical issues.    Lelon Frohlich, MD [facility] Pager [phone]  If 7PM-7AM, please contact night-coverage [email or web address] Password [facility] 06/28/2019, 11:49 AM

## 2019-06-28 NOTE — Progress Notes (Signed)
[facility] Progress Note     Subjective: CC-  Feeling a little better this morning. Abdominal pain is less. Some persistent nausea. NG tube with 2125cc out last 24 hours. No flatus or BM. Xray shows persistent moderate gastric distension. WBC 9.6, afebrile, mild tachycardia, no hypotension. AKI improving Cr 1.99  Husband at bedside. States that Ms. Locicero has had nothing to eat/drink since last Sunday.  Objective: Vital signs in last 24 hours: Temp:  [98 F (36.7 C)-98.4 F (36.9 C)] 98 F (36.7 C) (02/19 0427) Pulse Rate:  [107-115] 107 (02/19 0427) Resp:  [18-20] 18 (02/19 0427) BP: (114-141)/(71-96) 141/78 (02/19 0427) SpO2:  [95 %-98 %] 97 % (02/19 0427)    Intake/Output from previous day: 02/18 0701 - 02/19 0700 In: 0  Out: 2125 [Emesis/NG output:2125] Intake/Output this shift: Total I/O In: 3 [I.V.:3] Out: -   PE: Gen:  Alert, NAD, pleasant Card:  Mild tachy Pulm:  CTAB, no W/R/R, rate and effort normal Abd: Soft, mild distension, mild global TTP without rebound or guarding, hypoactive BS   Lab Results:  Recent Labs    06/27/19 0316 06/28/19 0419  WBC 5.7 9.6  HGB 14.2 13.9  HCT 44.2 43.2  PLT 167 177   BMET Recent Labs    06/27/19 0316 06/28/19 0419  NA 139 144  K 3.7 3.3*  CL 95* 93*  CO2 27 37*  GLUCOSE 197* 172*  BUN 51* 53*  CREATININE 2.15* 1.99*  CALCIUM 9.3 9.5   PT/INR Recent Labs    06/27/19 0316  LABPROT 13.5  INR 1.0   CMP     Component Value Date/Time   NA 144 06/28/2019 0419   NA 142 02/26/2019 1618   K 3.3 (L) 06/28/2019 0419   CL 93 (L) 06/28/2019 0419   CO2 37 (H) 06/28/2019 0419   GLUCOSE 172 (H) 06/28/2019 0419   BUN 53 (H) 06/28/2019 0419   BUN 19 02/26/2019 1618   CREATININE 1.99 (H) 06/28/2019 0419   CALCIUM 9.5 06/28/2019 0419   PROT 7.8 06/27/2019 0316   PROT 7.7 02/04/2019 1719   ALBUMIN 3.0 (L) 06/27/2019 0316   ALBUMIN 4.1 02/04/2019 1719   AST 25 06/27/2019 0316   ALT 19 06/27/2019  0316   ALKPHOS 71 06/27/2019 0316   BILITOT 2.3 (H) 06/27/2019 0316   BILITOT 0.4 02/04/2019 1719   GFRNONAA 23 (L) 06/28/2019 0419   GFRAA 27 (L) 06/28/2019 0419   Lipase     Component Value Date/Time   LIPASE 38 06/26/2019 1352       Studies/Results: DG Chest Port 1 View  Result Date: 06/26/2019 CLINICAL DATA:  Shortness of breath EXAM: PORTABLE CHEST 1 VIEW COMPARISON:  06/03/2010 FINDINGS: Cardiac shadows within normal limits. The lungs are well aerated bilaterally. No focal infiltrate or sizable effusion is seen. No bony abnormality is noted. IMPRESSION: No active disease. Electronically Signed   By: Inez Catalina M.D.   On: 06/26/2019 14:40   DG Abd Portable 1V  Result Date: 06/28/2019 CLINICAL DATA:  Small-bowel obstruction. EXAM: PORTABLE ABDOMEN - 1 VIEW COMPARISON:  06/26/2019.  CT 06/26/2019. FINDINGS: NG tube noted in stable position over the stomach. Moderate gastric distention noted on today's exam. Previously identified gastric pneumatosis best identified by prior recent CT. No bowel distention noted on today's exam. This could be secondary to fluid-filled loops of small bowel, reference made to prior CT report of 06/26/2019. No free air. Small calcifications in pelvis consistent phleboliths. Surgical clips are  noted in the pelvis. No acute bony abnormality. IMPRESSION: NG tube noted with its tip over the stomach. Moderate gastric distention on today's exam. Previously identified gastric pneumatosis best identified by prior recent CT. No bowel distention on today's exam. This could be secondary to fluid-filled loops of small bowel, reference is made to prior CT report of 06/26/2019. No free air. Electronically Signed   By: Marcello Moores  Register   On: 06/28/2019 05:49   DG Abd Portable 1 View  Result Date: 06/26/2019 CLINICAL DATA:  NG tube placement EXAM: PORTABLE ABDOMEN - 1 VIEW COMPARISON:  CT 06/26/2019 FINDINGS: Esophageal tube tip overlies the mid gastric region.  Incompletely visualized dilated small bowel in the left lower quadrant. Bubbly lucencies in the central abdomen, may correspond to gastric pneumatosis on recent CT IMPRESSION: Esophageal tube tip overlies the mid gastric region Electronically Signed   By: Donavan Foil M.D.   On: 06/26/2019 20:42   CT Renal Stone Study  Result Date: 06/26/2019 CLINICAL DATA:  Flank pain, nausea vomiting history of ovarian carcinoid tumor. EXAM: CT ABDOMEN AND PELVIS WITHOUT CONTRAST TECHNIQUE: Multidetector CT imaging of the abdomen and pelvis was performed following the standard protocol without IV contrast. COMPARISON:  07/27/2017 FINDINGS: Lower chest: Basilar atelectasis though with nodular changes at the left lung base. These are ill-defined. No dense consolidation or effusion. Hepatobiliary: Signs of portal venous gas. Limited assessment of the liver on noncontrast imaging. No signs of biliary ductal dilation. Pancreas: Pancreas displaced by markedly distended stomach. No signs of focal pancreatic lesion. Spleen: Spleen is normal. Adrenals/Urinary Tract: Adrenal glands are normal. No signs of hydronephrosis or nephrolithiasis. Stomach/Bowel: Gastric distension and small bowel dilation terminating in the distal jejunum in the right lower quadrant anterior to the iliac vasculature. Distal small bowel loops are decompressed. Adjacent to the appendix is a mesenteric nodule with calcification measuring approximately 12 mm. There is mild distension of the distal ileum. Question of filling defect within the distal ileum with 53 Hounsfield unit density within the terminal ileum at this location. The appendix is normal. Signs of gastric pneumatosis likely the source of portal venous gas. Gas in the wall of the stomach is best seen on image 51 of series 2. There is gas surrounding intraluminal contents of the stomach in other areas potentially intermixed with gastric pneumatosis as well, for instance on image 35 along the lateral  stomach. Gas within the extrahepatic portal system is not seen. Vascular/Lymphatic: Calcified atherosclerotic changes throughout the abdominal aorta. No signs of adenopathy in the retroperitoneum. Presumed mesenteric mass or adenopathy in the right lower quadrant (image 53, series 2) this is measured above. Reproductive: Post hysterectomy. No sign of pelvic mass with transition point for small-bowel obstruction in the pelvis. Other: No signs of free air at this time. Small amount of fluid in the pelvis. Musculoskeletal: No signs of acute bone finding or destructive bone process. IMPRESSION: 1. Small-bowel obstruction with marked gastric distension, moderate to marked small bowel distension, associated with gastric pneumatosis and portal venous gas. Surgical consultation is suggested. 2. Constellation of findings likely related to adhesions in the pelvis given appearance of small bowel loops. 3. 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 Subtle filling defect with soft tissue density suggested on today's evaluation. 4. Basilar subtle nodularity may represent early aspiration pneumonitis. Attention on follow-up. These results were called by telephone at the time of interpretation on 06/26/2019 at 5:11 pm to provider DAVID YAO and Dr. Vanita Panda, Who verbally  acknowledged these results. Electronically Signed   By: Zetta Bills M.D.   On: 06/26/2019 17:11    Anti-infectives: Anti-infectives (From admission, onward)   Start     Dose/Rate Route Frequency Ordered Stop   06/27/19 1000  piperacillin-tazobactam (ZOSYN) IVPB 3.375 g     3.375 g 12.5 mL/hr over 240 Minutes Intravenous Every 8 hours 06/27/19 0807     06/27/19 0200  piperacillin-tazobactam (ZOSYN) IVPB 2.25 g  Status:  Discontinued     2.25 g 100 mL/hr over 30 Minutes Intravenous Every 8 hours 06/26/19 2012 06/27/19 0807   06/26/19 1700  piperacillin-tazobactam (ZOSYN) IVPB 3.375 g     3.375 g 100 mL/hr  over 30 Minutes Intravenous  Once 06/26/19 1651 06/26/19 1823       Assessment/Plan DM AKI Lactic acidosis - resolved AKI - improving, Cr 1.99  SBO with gastric distention and pneumatosis - h/o Carcinoid tumor s/p ex lap with bilateral salpingo-oophorectomy, omentectomy and radical debulking for suspected ovarian cancer 08/31/2017 Dr. 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 Start small bowel protocol. Mobilize as able. Check prealbumin. May consider repeating CT scan with contrast in the next couple of days (if AKI continues to improve) to further evaluate possible ileal carcinod tumor seen on CT renal stone scan.   LOS: 2 days    [facility] 06/28/2019, 9:59 AM Please see Amion for pager number during day hours 7:00am-4:30pm

## 2019-06-29 ENCOUNTER — Inpatient Hospital Stay (HOSPITAL_COMMUNITY): Payer: Medicare Other

## 2019-06-29 LAB — BASIC METABOLIC PANEL
Anion gap: 18 — ABNORMAL HIGH (ref 5–15)
BUN: 45 mg/dL — ABNORMAL HIGH (ref 8–23)
CO2: 36 mmol/L — ABNORMAL HIGH (ref 22–32)
Calcium: 9.4 mg/dL (ref 8.9–10.3)
Chloride: 95 mmol/L — ABNORMAL LOW (ref 98–111)
Creatinine, Ser: 1.34 mg/dL — ABNORMAL HIGH (ref 0.44–1.00)
GFR calc Af Amer: 43 mL/min — ABNORMAL LOW (ref >60)
GFR calc non Af Amer: 37 mL/min — ABNORMAL LOW (ref >60)
Glucose, Bld: 132 mg/dL — ABNORMAL HIGH (ref 70–99)
Potassium: 2.5 mmol/L — CL (ref 3.5–5.1)
Sodium: 149 mmol/L — ABNORMAL HIGH (ref 135–145)

## 2019-06-29 LAB — CBC
HCT: 40.8 % (ref 36.0–46.0)
Hemoglobin: 12.5 g/dL (ref 12.0–15.0)
MCH: 25.5 pg — ABNORMAL LOW (ref 26.0–34.0)
MCHC: 30.6 g/dL (ref 30.0–36.0)
MCV: 83.3 fL (ref 80.0–100.0)
Platelets: 188 10*3/uL (ref 150–400)
RBC: 4.9 MIL/uL (ref 3.87–5.11)
RDW: 16.8 % — ABNORMAL HIGH (ref 11.5–15.5)
WBC: 10.7 10*3/uL — ABNORMAL HIGH (ref 4.0–10.5)
nRBC: 0 % (ref 0.0–0.2)

## 2019-06-29 LAB — GLUCOSE, CAPILLARY
Glucose-Capillary: 115 mg/dL — ABNORMAL HIGH (ref 70–99)
Glucose-Capillary: 124 mg/dL — ABNORMAL HIGH (ref 70–99)
Glucose-Capillary: 108 mg/dL — ABNORMAL HIGH (ref 70–99)

## 2019-06-29 LAB — MAGNESIUM: Magnesium: 2.5 mg/dL — ABNORMAL HIGH (ref 1.7–2.4)

## 2019-06-29 MED ORDER — LIDOCAINE HCL URETHRAL/MUCOSAL 2 % EX GEL
1.0000 "application " | Freq: Once | CUTANEOUS | Status: AC
  Administered 2019-06-29: 1
  Filled 2019-06-29: qty 5

## 2019-06-29 MED ORDER — BUTAMBEN-TETRACAINE-BENZOCAINE 2-2-14 % EX AERO
1.0000 | INHALATION_SPRAY | Freq: Once | CUTANEOUS | Status: AC
  Administered 2019-06-29: 08:00:00 1 via TOPICAL
  Filled 2019-06-29: qty 20

## 2019-06-29 MED ORDER — POTASSIUM CHLORIDE 10 MEQ/100ML IV SOLN
10.0000 meq | INTRAVENOUS | Status: AC
  Administered 2019-06-29 (×6): 10 meq via INTRAVENOUS
  Filled 2019-06-29 (×6): qty 100

## 2019-06-29 NOTE — Progress Notes (Addendum)
Multiple attempts of insertion of 14Fr. NG with no success.   10 Fr. NG tube inserted by Judson Roch, ICU RN.  Small amount of green liquid removed when hooked up to LIS.  KUB ordered to confirm placement.  Safety measures to prevent patient pulling out NG tube again include safety mitts, tele monitoring and husband sitting at beside.

## 2019-06-29 NOTE — Progress Notes (Addendum)
PROGRESS NOTE    Gloria Murphy  [id number] DOB: 1935/11/20 DOA: 06/26/2019 PCP: Carlyle Basques, MD     Brief Narrative:  84 year old woman admitted from home on 2/17 due to nausea and vomiting with decreased p.o. intake.  In the ED she was found to have a large small bowel obstruction and gastric distention.  She was seen by surgery in the ED and NG tube was placed.  She has a history of a pelvic carcinoid tumor for which she underwent exploratory laparotomy in April 2019 with bilateral salpingo-oophorectomy and omentectomy as well as partial hysterectomy.   Assessment & Plan:   Principal Problem:   SBO (small bowel obstruction) (HCC) Active Problems:   Malignant carcinoid tumor of unknown primary site [facility])   Type 2 diabetes mellitus without complication, without long-term current use of insulin (HCC)   AKI (acute kidney injury) (HCC)   Nausea and vomiting   Abdominal pain   Lactic acidosis   Elevated troponin   Sinus tachycardia   Small bowel obstruction -Pulled NG out last night. Has been replaced. Small amount of bilious drainage present in canister. -Surgery is following. -Repeat abdominal film today shows mildly dilated small bowel loops, not changed compatible with SBO or ileus. -Continue NG suction.  -Continue IV fluids. -If SBO not resolving by early next week, consider TPN vs palliative evaluation. -No clear evidence of intra-abdominal infection: DC Zosyn.   Sinus tachycardia -Suspect intravascular volume depletion due to high volume NG output. -She received a 500 cc bolus in addition to her maintenance IV fluids on 2/18 due to heart rates that were persistently in the 120s. -Heart rates today and overnight have been in the low 100s.  Pelvic carcinoid tumor -Continue follow-up with GYN oncology, Dr. Denman George as an outpatient.  Acute renal failure -Likely due to intravascular volume depletion and acute tubular necrosis. -Creatinine on admission was 2.52, is  down to 1.34 today. -Continue IV fluids  Type 2 diabetes -Managed at home with diet and exercise. -Continue sensitive sliding scale. -Fair control so far while hospitalized.  Severe Hypokalemia -Likely due to NG suction. -Replace IV. -Check Mg levels. -Recheck electrolytes in am.    DVT prophylaxis: SCDs Code Status: Full code Family Communication: Patient only Disposition Plan: Anticipate will need inpatient at least an additional 72 hours.  Consultants:   Surgery  Procedures:   NG tube placement 2/17  Antimicrobials:  Anti-infectives (From admission, onward)   Start     Dose/Rate Route Frequency Ordered Stop   06/27/19 1000  piperacillin-tazobactam (ZOSYN) IVPB 3.375 g     3.375 g 12.5 mL/hr over 240 Minutes Intravenous Every 8 hours 06/27/19 0807     06/27/19 0200  piperacillin-tazobactam (ZOSYN) IVPB 2.25 g  Status:  Discontinued     2.25 g 100 mL/hr over 30 Minutes Intravenous Every 8 hours 06/26/19 2012 06/27/19 0807   06/26/19 1700  piperacillin-tazobactam (ZOSYN) IVPB 3.375 g     3.375 g 100 mL/hr over 30 Minutes Intravenous  Once 06/26/19 1651 06/26/19 1823       Subjective: Lying in bed, less abdominal pain, some persistent nausea, no emesis overnight. Seems a little confused.  Objective: Vitals:   06/28/19 0427 06/28/19 1340 06/28/19 2347 06/29/19 0559  BP: (!) 141/78 (!) 143/79 134/79 (!) 149/76  Pulse: (!) 107 (!) 103 (!) 115 97  Resp: 18 18 20 20   Temp: 98 F (36.7 C) 98.4 F (36.9 C) 98.5 F (36.9 C) 98 F (36.7 C)  TempSrc: Oral  Oral    SpO2: 97% 98% 98% 100%  Weight:      Height:        Intake/Output Summary (Last 24 hours) at 06/29/2019 1111 Last data filed at 06/29/2019 [id number] Gross per 24 hour  Intake 2155.08 ml  Output 1250 ml  Net 905.08 ml   Filed Weights   06/26/19 2057  Weight: 66.1 kg    Examination:  General exam: Alert, awake, oriented to person and place Respiratory system: Clear to auscultation. Respiratory  effort normal. Cardiovascular system: Tachycardic, regular rhythm. No murmurs, rubs, gallops. Gastrointestinal system: NG tube is in place abdomen is distended, soft and nontender to palpation. Hypoactive bowel sounds heard. Central nervous system: Moves all 4 spontaneously. Extremities: No C/C/E, +pedal pulses Skin: No rashes, lesions or ulcers Psychiatry: Judgement and insight appear normal. Mood & affect appropriate.     Data Reviewed: I have personally reviewed following labs and imaging studies  CBC: Recent Labs  Lab 06/26/19 1352 06/27/19 0316 06/28/19 0419 06/29/19 0546  WBC 6.4 5.7 9.6 10.7*  HGB 14.5 14.2 13.9 12.5  HCT 44.6 44.2 43.2 40.8  MCV 79.2* 80.8 81.5 83.3  PLT 254 167 177 0000000   Basic Metabolic Panel: Recent Labs  Lab 06/26/19 1352 06/26/19 2107 06/27/19 0316 06/28/19 0419 06/29/19 0546  NA 136  --  139 144 149*  K 3.7  --  3.7 3.3* 2.5*  CL 88*  --  95* 93* 95*  CO2 28  --  27 37* 36*  GLUCOSE 233*  --  197* 172* 132*  BUN 48*  --  51* 53* 45*  CREATININE 2.52*  --  2.15* 1.99* 1.34*  CALCIUM 10.2  --  9.3 9.5 9.4  MG  --  2.3 2.3  --  2.5*   GFR: Estimated Creatinine Clearance: 34.4 mL/min (A) (by C-G formula based on SCr of 1.34 mg/dL (H)). Liver Function Tests: Recent Labs  Lab 06/26/19 1352 06/27/19 0316  AST 31 25  ALT 22 19  ALKPHOS 85 71  BILITOT 1.3* 2.3*  PROT 9.6* 7.8  ALBUMIN 3.8 3.0*   Recent Labs  Lab 06/26/19 1352  LIPASE 38   No results for input(s): AMMONIA in the last 168 hours. Coagulation Profile: Recent Labs  Lab 06/27/19 0316  INR 1.0   Cardiac Enzymes: No results for input(s): CKTOTAL, CKMB, CKMBINDEX, TROPONINI in the last 168 hours. BNP (last 3 results) No results for input(s): PROBNP in the last 8760 hours. HbA1C: Recent Labs    06/27/19 0316  HGBA1C 6.3*   CBG: Recent Labs  Lab 06/28/19 0623 06/28/19 1237 06/28/19 1756 06/28/19 2343 06/29/19 0556  GLUCAP 155* 177* 107* 154* 115*    Lipid Profile: No results for input(s): CHOL, HDL, LDLCALC, TRIG, CHOLHDL, LDLDIRECT in the last 72 hours. Thyroid Function Tests: Recent Labs    06/26/19 1530 06/27/19 0316  TSH 0.027*  --   FREET4 1.48*  --   T3FREE  --  4.6*   Anemia Panel: No results for input(s): VITAMINB12, FOLATE, FERRITIN, TIBC, IRON, RETICCTPCT in the last 72 hours. Urine analysis:    Component Value Date/Time   COLORURINE YELLOW 08/28/2017 1057   APPEARANCEUR HAZY (A) 08/28/2017 1057   LABSPEC 1.020 10/25/2017 1931   PHURINE 5.5 10/25/2017 1931   GLUCOSEU NEGATIVE 10/25/2017 1931   HGBUR NEGATIVE 10/25/2017 1931   BILIRUBINUR NEGATIVE 10/25/2017 1931   KETONESUR NEGATIVE 10/25/2017 1931   PROTEINUR NEGATIVE 10/25/2017 1931   UROBILINOGEN 0.2 10/25/2017 1931  NITRITE NEGATIVE 10/25/2017 1931   LEUKOCYTESUR SMALL (A) 10/25/2017 1931   Sepsis Labs: @LABRCNTIP (procalcitonin:4,lacticidven:4)  ) Recent Results (from the past 240 hour(s))  Novel Coronavirus, NAA (Hosp order, Send-out to Ref Lab; TAT 18-24 hrs     Status: None   Collection Time: 06/24/19  9:16 AM   Specimen: Nasopharyngeal Swab; Respiratory  Result Value Ref Range Status   SARS-CoV-2, NAA NOT DETECTED NOT DETECTED Final    Comment: (NOTE) This nucleic acid amplification test was developed and its performance characteristics determined by [employer]. Nucleic acid amplification tests include RT-PCR and TMA. This test has not been FDA cleared or approved. This test has been authorized by FDA under an Emergency Use Authorization (EUA). This test is only authorized for the duration of time the declaration that circumstances exist justifying the authorization of the emergency use of in vitro diagnostic tests for detection of SARS-CoV-2 virus and/or diagnosis of COVID-19 infection under section 564(b)(1) of the Act, 21 U.S.C. [id number]) (1), unless the authorization is terminated or revoked sooner. When diagnostic testing  is negative, the possibility of a false negative result should be considered in the context of a patient's recent exposures and the presence of clinical signs and symptoms consistent with COVID-19. An individual without symptoms of COVID- 19 and who is not shedding SARS-CoV-2  virus would expect to have a negative (not detected) result in this assay. Performed At: [facility] [address] [place], [place] [id number] Rush Farmer MD [id number]    [place]  Final    Comment: Performed at [facility], [place] [address], [place], [place] [address]  Blood culture (routine x 2)     Status: None (Preliminary result)   Collection Time: 06/26/19  5:33 PM   Specimen: BLOOD  Result Value Ref Range Status   Specimen Description BLOOD LEFT ANTECUBITAL  Final   Special Requests   Final    BOTTLES DRAWN AEROBIC AND ANAEROBIC Blood Culture adequate volume Performed at [facility] [address] [place], [place] [address]    Culture NO GROWTH 3 DAYS  Final   Report Status PENDING  Incomplete  Respiratory Panel by RT PCR (Flu A&B, Covid) - Nasopharyngeal Swab     Status: None   Collection Time: 06/26/19  6:07 PM   Specimen: Nasopharyngeal Swab  Result Value Ref Range Status   SARS Coronavirus 2 by RT PCR NEGATIVE NEGATIVE Final    Comment: (NOTE) SARS-CoV-2 target nucleic acids are NOT DETECTED. The SARS-CoV-2 RNA is generally detectable in upper respiratoy specimens during the acute phase of infection. The lowest concentration of SARS-CoV-2 viral copies this assay can detect is 131 copies/mL. A negative result does not preclude SARS-Cov-2 infection and should not be used as the sole basis for treatment or other patient management decisions. A negative result may occur with  improper specimen collection/handling, submission of specimen other than nasopharyngeal swab, presence of viral mutation(s) within the areas targeted by this  assay, and inadequate number of viral copies (<131 copies/mL). A negative result must be combined with clinical observations, patient history, and epidemiological information. The expected result is Negative. Fact Sheet for Patients:  PinkCheek.be Fact Sheet for Healthcare Providers:  GravelBags.it This test is not yet ap proved or cleared by the [place] FDA and  has been authorized for detection and/or diagnosis of SARS-CoV-2 by FDA under an Emergency Use Authorization (EUA). This EUA will remain  in effect (meaning this test can be used) for  the duration of the COVID-19 declaration under Section 564(b)(1) of the Act, 21 U.S.C. section 360bbb-3(b)(1), unless the authorization is terminated or revoked sooner.    Influenza A by PCR NEGATIVE NEGATIVE Final   Influenza B by PCR NEGATIVE NEGATIVE Final    Comment: (NOTE) The Xpert Xpress SARS-CoV-2/FLU/RSV assay is intended as an aid in  the diagnosis of influenza from Nasopharyngeal swab specimens and  should not be used as a sole basis for treatment. Nasal washings and  aspirates are unacceptable for Xpert Xpress SARS-CoV-2/FLU/RSV  testing. Fact Sheet for Patients: PinkCheek.be Fact Sheet for Healthcare Providers: GravelBags.it This test is not yet approved or cleared by the [place] FDA and  has been authorized for detection and/or diagnosis of SARS-CoV-2 by  FDA under an Emergency Use Authorization (EUA). This EUA will remain  in effect (meaning this test can be used) for the duration of the  Covid-19 declaration under Section 564(b)(1) of the Act, 21  U.S.C. section 360bbb-3(b)(1), unless the authorization is  terminated or revoked. Performed at [facility], [place] [address] [place], [place] [address]   Blood culture (routine x 2)     Status: None (Preliminary result)   Collection  Time: 06/26/19  6:12 PM   Specimen: Site Not Specified; Blood  Result Value Ref Range Status   Specimen Description SITE NOT SPECIFIED  Final   Special Requests   Final    BOTTLES DRAWN AEROBIC AND ANAEROBIC Blood Culture adequate volume Performed at [facility] [address] [place], [place] [address]    Culture NO GROWTH 3 DAYS  Final   Report Status PENDING  Incomplete         Radiology Studies: DG Abd 1 View  Result Date: 06/29/2019 CLINICAL DATA:  NG tube placement. EXAM: ABDOMEN - 1 VIEW COMPARISON:  06/29/2019 at 4:24 a.m. FINDINGS: Interval placement of nasogastric tube with tip over the stomach in the left mid abdomen. Bowel gas pattern is nonobstructive. No free peritoneal air. Multiple surgical clips over the left lower quadrant. Remainder of the exam is unchanged. IMPRESSION: Nasogastric tube with tip over the stomach in the left mid abdomen. Nonobstructive bowel gas pattern. Electronically Signed   By: Marin Olp M.D.   On: 06/29/2019 11:04   DG Abd Portable 1V  Result Date: 06/29/2019 CLINICAL DATA:  Small bowel obstruction EXAM: PORTABLE ABDOMEN - 1 VIEW COMPARISON:  06/28/2019 abdominal radiograph FINDINGS: Surgical clips overlie the left pelvis. Previously visualized enteric tube is not seen on this radiograph. Mildly dilated central small bowel loops up to 3.5 cm diameter, not definitely changed. No evidence of pneumatosis or pneumoperitoneum. No radiopaque nephrolithiasis. IMPRESSION: Mildly dilated central small bowel loops, not definitely changed, compatible with either low-grade mid to distal small bowel obstruction or mild ileus. Previously visualized enteric tube is not seen on this radiograph. Electronically Signed   By: Ilona Sorrel M.D.   On: 06/29/2019 05:24   DG Abd Portable 1V  Result Date: 06/28/2019 CLINICAL DATA:  Confirm NG tube placement. EXAM: PORTABLE ABDOMEN - 1 VIEW COMPARISON:  Radiographs earlier this day. CT 06/26/2019  FINDINGS: Tip and side port of the enteric tube below the diaphragm in the left upper quadrant, likely within distended stomach. There is decreased gaseous gastric distension from earlier this day. Gastric pneumatosis on prior CT is not well demonstrated. No obvious free air. IMPRESSION: Tip and side port of the enteric tube below the diaphragm in the left upper quadrant, likely within distended  stomach. Decreased gaseous gastric distension from prior exam. Electronically Signed   By: Keith Rake M.D.   On: 06/28/2019 19:17   DG Abd Portable 1V-Small Bowel Obstruction Protocol-initial, 8 hr delay  Result Date: 06/28/2019 CLINICAL DATA:  Follow-up small-bowel obstruction. EXAM: PORTABLE ABDOMEN - 1 VIEW COMPARISON:  Prior abdomen same day. Abdomen 06/26/2019. CT 06/26/2019. FINDINGS: NG tube noted stable position with tip in the stomach. Persistent moderate gastric distention. Previously noted gastric pneumatosis best evaluated on prior CT. Distended loop of small bowel noted. Again distended fluid-filled loops may be present as noted on prior CT of 06/26/2019. Colonic gas pattern is normal. Stool in the colon. No free air. Pelvic calcifications consistent phleboliths. Surgical clips in the pelvis. Degenerative changes lumbar spine and both hips. IMPRESSION: 1. NG tube noted stable position of the stomach. Persistent moderate gastric distention. Previously identified gastric pneumatosis again best identified by prior CT. 2. Distended loops of small bowel noted. Again distended fluid-filled loops may be present as noted on prior CT of 06/26/2019. Colonic gas pattern is normal. Electronically Signed   By: Marcello Moores  Register   On: 06/28/2019 10:32   DG Abd Portable 1V  Result Date: 06/28/2019 CLINICAL DATA:  Small-bowel obstruction. EXAM: PORTABLE ABDOMEN - 1 VIEW COMPARISON:  06/26/2019.  CT 06/26/2019. FINDINGS: NG tube noted in stable position over the stomach. Moderate gastric distention noted on today's  exam. Previously identified gastric pneumatosis best identified by prior recent CT. No bowel distention noted on today's exam. This could be secondary to fluid-filled loops of small bowel, reference made to prior CT report of 06/26/2019. No free air. Small calcifications in pelvis consistent phleboliths. Surgical clips are noted in the pelvis. No acute bony abnormality. IMPRESSION: NG tube noted with its tip over the stomach. Moderate gastric distention on today's exam. Previously identified gastric pneumatosis best identified by prior recent CT. No bowel distention on today's exam. This could be secondary to fluid-filled loops of small bowel, reference is made to prior CT report of 06/26/2019. No free air. Electronically Signed   By: Marcello Moores  Register   On: 06/28/2019 05:49        Scheduled Meds: . insulin aspart  0-6 Units Subcutaneous Q6H  . mouth rinse  15 mL Mouth Rinse BID  . sodium chloride flush  3 mL Intravenous Once  . sodium chloride flush  3 mL Intravenous Q12H   Continuous Infusions: . lactated ringers 100 mL/hr at 06/29/19 0754  . piperacillin-tazobactam (ZOSYN)  IV 3.375 g (06/29/19 1002)  . potassium chloride 10 mEq (06/29/19 0959)     LOS: 3 days    Time spent: 25 minutes. Greater than 50% of this time was spent in direct contact with the patient, coordinating care and discussing relevant ongoing clinical issues.    Lelon Frohlich, MD [facility] Pager [phone]  If 7PM-7AM, please contact night-coverage [email or web address] Password [email or web address] 06/29/2019, 11:11 AM

## 2019-06-29 NOTE — Progress Notes (Addendum)
Despite safety measures  in place, patient pulled out NG tube again.  Dr. Jerilee Hoh and Dr. Marcello Moores made aware.  Verbal order to reinsert tube. 12 Fr. NG tube placed successfully with 400cc's of dark green liquid returned.    Verbal order for non-violent restraints until a safety sitter can be with patient.  Charge nurse made aware.  Patient resting comfortably in bed.

## 2019-06-29 NOTE — Progress Notes (Signed)
CRITICAL VALUE ALERT  Critical Value:  K+ 2.5  Date & Time Notied:  06/29/19 @ [id number]  Provider Notified: NP Bodenhiemer  Orders Received/Actions taken: pending

## 2019-06-29 NOTE — Progress Notes (Signed)
Even with sitter in room and mitts on, pt turned head to side and pulled NGT out again. On call Bodenheimer, NP notified. Order for bilateral wrist restraints ordered and verbal order to replace NGT received. This RN will contact ICU RN to place NGT. Pt placed back in soft wrist restraints. Will monitor pt closely.

## 2019-06-29 NOTE — Progress Notes (Signed)
Patient pulled her NGT out despite having bilateral mittens on. Patient is very restless and agitated and has made several attempts to get OOB unassisted.

## 2019-06-29 NOTE — Progress Notes (Signed)
[facility] Progress Note     Subjective: CC-  Feeling a little better this morning.  Pulled out NG overnight   Objective: Vital signs in last 24 hours: Temp:  [98 F (36.7 C)-98.5 F (36.9 C)] 98 F (36.7 C) (02/20 0559) Pulse Rate:  [97-115] 97 (02/20 0559) Resp:  [18-20] 20 (02/20 0559) BP: (134-149)/(76-79) 149/76 (02/20 0559) SpO2:  [98 %-100 %] 100 % (02/20 0559)    Intake/Output from previous day: 02/19 0701 - 02/20 0700 In: 2608.1 [I.V.:2425.5; IV Piggyback:182.6] Out: 1450 [Urine:200; Emesis/NG output:1250] Intake/Output this shift: No intake/output data recorded.  PE: Gen:  Alert, NAD, pleasant Card:  Mild tachy Pulm:  CTAB, no W/R/R, rate and effort normal Abd: Soft, mild distension, mild global TTP without rebound or guarding   Lab Results:  Recent Labs    06/28/19 0419 06/29/19 0546  WBC 9.6 10.7*  HGB 13.9 12.5  HCT 43.2 40.8  PLT 177 188   BMET Recent Labs    06/28/19 0419 06/29/19 0546  NA 144 149*  K 3.3* 2.5*  CL 93* 95*  CO2 37* 36*  GLUCOSE 172* 132*  BUN 53* 45*  CREATININE 1.99* 1.34*  CALCIUM 9.5 9.4   PT/INR Recent Labs    06/27/19 0316  LABPROT 13.5  INR 1.0   CMP     Component Value Date/Time   NA 149 (H) 06/29/2019 0546   NA 142 02/26/2019 1618   K 2.5 (LL) 06/29/2019 0546   CL 95 (L) 06/29/2019 0546   CO2 36 (H) 06/29/2019 0546   GLUCOSE 132 (H) 06/29/2019 0546   BUN 45 (H) 06/29/2019 0546   BUN 19 02/26/2019 1618   CREATININE 1.34 (H) 06/29/2019 0546   CALCIUM 9.4 06/29/2019 0546   PROT 7.8 06/27/2019 0316   PROT 7.7 02/04/2019 1719   ALBUMIN 3.0 (L) 06/27/2019 0316   ALBUMIN 4.1 02/04/2019 1719   AST 25 06/27/2019 0316   ALT 19 06/27/2019 0316   ALKPHOS 71 06/27/2019 0316   BILITOT 2.3 (H) 06/27/2019 0316   BILITOT 0.4 02/04/2019 1719   GFRNONAA 37 (L) 06/29/2019 0546   GFRAA 43 (L) 06/29/2019 0546   Lipase     Component Value Date/Time   LIPASE 38 06/26/2019 1352    Lab Results   Component Value Date   LABPROT 13.5 06/27/2019   Alb 3.0, prealb: 17   Studies/Results: DG Abd Portable 1V  Result Date: 06/29/2019 CLINICAL DATA:  Small bowel obstruction EXAM: PORTABLE ABDOMEN - 1 VIEW COMPARISON:  06/28/2019 abdominal radiograph FINDINGS: Surgical clips overlie the left pelvis. Previously visualized enteric tube is not seen on this radiograph. Mildly dilated central small bowel loops up to 3.5 cm diameter, not definitely changed. No evidence of pneumatosis or pneumoperitoneum. No radiopaque nephrolithiasis. IMPRESSION: Mildly dilated central small bowel loops, not definitely changed, compatible with either low-grade mid to distal small bowel obstruction or mild ileus. Previously visualized enteric tube is not seen on this radiograph. Electronically Signed   By: Ilona Sorrel M.D.   On: 06/29/2019 05:24   DG Abd Portable 1V  Result Date: 06/28/2019 CLINICAL DATA:  Confirm NG tube placement. EXAM: PORTABLE ABDOMEN - 1 VIEW COMPARISON:  Radiographs earlier this day. CT 06/26/2019 FINDINGS: Tip and side port of the enteric tube below the diaphragm in the left upper quadrant, likely within distended stomach. There is decreased gaseous gastric distension from earlier this day. Gastric pneumatosis on prior CT is not well demonstrated. No obvious free air. IMPRESSION: Tip  and side port of the enteric tube below the diaphragm in the left upper quadrant, likely within distended stomach. Decreased gaseous gastric distension from prior exam. Electronically Signed   By: Keith Rake M.D.   On: 06/28/2019 19:17   DG Abd Portable 1V-Small Bowel Obstruction Protocol-initial, 8 hr delay  Result Date: 06/28/2019 CLINICAL DATA:  Follow-up small-bowel obstruction. EXAM: PORTABLE ABDOMEN - 1 VIEW COMPARISON:  Prior abdomen same day. Abdomen 06/26/2019. CT 06/26/2019. FINDINGS: NG tube noted stable position with tip in the stomach. Persistent moderate gastric distention. Previously noted gastric  pneumatosis best evaluated on prior CT. Distended loop of small bowel noted. Again distended fluid-filled loops may be present as noted on prior CT of 06/26/2019. Colonic gas pattern is normal. Stool in the colon. No free air. Pelvic calcifications consistent phleboliths. Surgical clips in the pelvis. Degenerative changes lumbar spine and both hips. IMPRESSION: 1. NG tube noted stable position of the stomach. Persistent moderate gastric distention. Previously identified gastric pneumatosis again best identified by prior CT. 2. Distended loops of small bowel noted. Again distended fluid-filled loops may be present as noted on prior CT of 06/26/2019. Colonic gas pattern is normal. Electronically Signed   By: Marcello Moores  Register   On: 06/28/2019 10:32   DG Abd Portable 1V  Result Date: 06/28/2019 CLINICAL DATA:  Small-bowel obstruction. EXAM: PORTABLE ABDOMEN - 1 VIEW COMPARISON:  06/26/2019.  CT 06/26/2019. FINDINGS: NG tube noted in stable position over the stomach. Moderate gastric distention noted on today's exam. Previously identified gastric pneumatosis best identified by prior recent CT. No bowel distention noted on today's exam. This could be secondary to fluid-filled loops of small bowel, reference made to prior CT report of 06/26/2019. No free air. Small calcifications in pelvis consistent phleboliths. Surgical clips are noted in the pelvis. No acute bony abnormality. IMPRESSION: NG tube noted with its tip over the stomach. Moderate gastric distention on today's exam. Previously identified gastric pneumatosis best identified by prior recent CT. No bowel distention on today's exam. This could be secondary to fluid-filled loops of small bowel, reference is made to prior CT report of 06/26/2019. No free air. Electronically Signed   By: Marcello Moores  Register   On: 06/28/2019 05:49    Anti-infectives: Anti-infectives (From admission, onward)   Start     Dose/Rate Route Frequency Ordered Stop   06/27/19 1000   piperacillin-tazobactam (ZOSYN) IVPB 3.375 g     3.375 g 12.5 mL/hr over 240 Minutes Intravenous Every 8 hours 06/27/19 0807     06/27/19 0200  piperacillin-tazobactam (ZOSYN) IVPB 2.25 g  Status:  Discontinued     2.25 g 100 mL/hr over 30 Minutes Intravenous Every 8 hours 06/26/19 2012 06/27/19 0807   06/26/19 1700  piperacillin-tazobactam (ZOSYN) IVPB 3.375 g     3.375 g 100 mL/hr over 30 Minutes Intravenous  Once 06/26/19 1651 06/26/19 1823       Assessment/Plan DM AKI Lactic acidosis - resolved AKI - improving, Cr 1.99  SBO with gastric distention and pneumatosis - h/o Carcinoid tumor s/p ex lap with bilateral salpingo-oophorectomy, omentectomy and radical debulking for suspected ovarian cancer 08/31/2017 Dr. 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 Cont small bowel protocol. Mobilize as able.  Malnutrition:Consider TPN May consider repeating CT scan with contrast in the next couple of days (if AKI continues to improve) to further evaluate possible ileal carcinod tumor seen on CT renal stone scan but if doesn't resolve SBO, may just eval in surgery.   LOS: 3 days    Rosario Adie, [facility] 06/29/2019, 7:58 AM Please see Amion for pager number during day hours 7:00am-4:30pm

## 2019-06-29 NOTE — Progress Notes (Signed)
Several attempts were made to reinsert NGT but were unsuccessful. Size 31F and size 59F tubes were tried as we could not pass her nasal cavity due to resistance. As per earlier progress note from morning shift, patient pulled her NGT out and one was replaced prior to change of shift, Received order from NP Bodenheimer was instructed [place] to monitor patient for vomiting and to inform him if she does. Patient is currently resting quietly and has no c/o nausea, vomiting, or abdominal pain.

## 2019-06-30 ENCOUNTER — Inpatient Hospital Stay (HOSPITAL_COMMUNITY): Payer: Medicare Other

## 2019-06-30 LAB — CBC
HCT: 34.6 % — ABNORMAL LOW (ref 36.0–46.0)
Hemoglobin: 10.8 g/dL — ABNORMAL LOW (ref 12.0–15.0)
MCH: 26 pg (ref 26.0–34.0)
MCHC: 31.2 g/dL (ref 30.0–36.0)
MCV: 83.2 fL (ref 80.0–100.0)
Platelets: 167 10*3/uL (ref 150–400)
RBC: 4.16 MIL/uL (ref 3.87–5.11)
RDW: 16.9 % — ABNORMAL HIGH (ref 11.5–15.5)
WBC: 8 10*3/uL (ref 4.0–10.5)
nRBC: 0 % (ref 0.0–0.2)

## 2019-06-30 LAB — GLUCOSE, CAPILLARY
Glucose-Capillary: 100 mg/dL — ABNORMAL HIGH (ref 70–99)
Glucose-Capillary: 111 mg/dL — ABNORMAL HIGH (ref 70–99)
Glucose-Capillary: 77 mg/dL (ref 70–99)
Glucose-Capillary: 86 mg/dL (ref 70–99)

## 2019-06-30 LAB — BASIC METABOLIC PANEL
Anion gap: 12 (ref 5–15)
BUN: 28 mg/dL — ABNORMAL HIGH (ref 8–23)
CO2: 34 mmol/L — ABNORMAL HIGH (ref 22–32)
Calcium: 8.9 mg/dL (ref 8.9–10.3)
Chloride: 100 mmol/L (ref 98–111)
Creatinine, Ser: 0.95 mg/dL (ref 0.44–1.00)
GFR calc Af Amer: >60 mL/min (ref >60)
GFR calc non Af Amer: 56 mL/min — ABNORMAL LOW (ref >60)
Glucose, Bld: 104 mg/dL — ABNORMAL HIGH (ref 70–99)
Potassium: 2.9 mmol/L — ABNORMAL LOW (ref 3.5–5.1)
Sodium: 146 mmol/L — ABNORMAL HIGH (ref 135–145)

## 2019-06-30 MED ORDER — DEXTROSE-NACL 5-0.45 % IV SOLN: INTRAVENOUS | Status: DC

## 2019-06-30 MED ORDER — POTASSIUM CHLORIDE 10 MEQ/100ML IV SOLN
10.0000 meq | INTRAVENOUS | Status: AC
  Administered 2019-06-30 (×5): 10 meq via INTRAVENOUS
  Filled 2019-06-30 (×6): qty 100

## 2019-06-30 MED ORDER — POLYVINYL ALCOHOL 1.4 % OP SOLN
1.0000 [drp] | Freq: Three times a day (TID) | OPHTHALMIC | Status: DC
  Administered 2019-06-30 – 2019-07-23 (×63): 1 [drp] via OPHTHALMIC
  Filled 2019-06-30: qty 15

## 2019-06-30 MED ORDER — POTASSIUM CHLORIDE 10 MEQ/100ML IV SOLN
10.0000 meq | Freq: Once | INTRAVENOUS | Status: AC
  Administered 2019-06-30: 10 meq via INTRAVENOUS

## 2019-06-30 NOTE — Progress Notes (Signed)
Output 300 so far this shift but now is not moving in tubing.  NG tube flushed and green out put immediately returned 125 ml.  Will continue to monitor.

## 2019-06-30 NOTE — Progress Notes (Signed)
Subjective/Chief Complaint: Continues to have high NG output but also will still pull NG out intermittently Remains hemodynamically stable   Objective: Vital signs in last 24 hours: Temp:  [97.8 F (36.6 C)-99.2 F (37.3 C)] 99.2 F (37.3 C) (02/21 0519) Pulse Rate:  [91-112] 91 (02/21 0519) Resp:  [15-20] 20 (02/21 0519) BP: (144-154)/(66-72) 151/71 (02/21 0519) SpO2:  [98 %-100 %] 100 % (02/21 0519)    Intake/Output from previous day: 02/20 0701 - 02/21 0700 In: 2261.1 [I.V.:2261.1] Out: 2550 [Urine:750; Emesis/NG output:1800] Intake/Output this shift: No intake/output data recorded.  Exam: Awake Abdomen soft with mild tenderness, no peritoneal signs   Lab Results:  Recent Labs    06/29/19 0546 06/30/19 0454  WBC 10.7* 8.0  HGB 12.5 10.8*  HCT 40.8 34.6*  PLT 188 167   BMET Recent Labs    06/29/19 0546 06/30/19 0454  NA 149* 146*  K 2.5* 2.9*  CL 95* 100  CO2 36* 34*  GLUCOSE 132* 104*  BUN 45* 28*  CREATININE 1.34* 0.95  CALCIUM 9.4 8.9   PT/INR No results for input(s): LABPROT, INR in the last 72 hours. ABG No results for input(s): PHART, HCO3 in the last 72 hours.  Invalid input(s): PCO2, PO2  Studies/Results: DG Abd 1 View  Result Date: 06/30/2019 CLINICAL DATA:  NG tube repositioning EXAM: ABDOMEN - 1 VIEW COMPARISON:  06/30/2019 FINDINGS: The enteric tube remains looped within the gastric body with the tip projecting over the gastric fundus. The tip is pointed towards the GE junction. IMPRESSION: NG tube as above.  The stomach remains distended. Electronically Signed   By: Constance Holster M.D.   On: 06/30/2019 02:04   DG Abd 1 View  Result Date: 06/30/2019 CLINICAL DATA:  Repositioning of NG tube. EXAM: ABDOMEN - 1 VIEW COMPARISON:  06/30/2019 FINDINGS: The enteric tube tip projects over the gastric body/fundus. The tube is coiled within the stomach. Consider retracting the tube by approximately 10-15 cm. Again noted is a distended  stomach. IMPRESSION: 1. NG tube as above. 2. Distended stomach. Electronically Signed   By: Constance Holster M.D.   On: 06/30/2019 01:20   DG Abd 1 View  Result Date: 06/30/2019 CLINICAL DATA:  NG tube placement, patient pulled out previous NG tube. EXAM: ABDOMEN - 1 VIEW COMPARISON:  Radiograph earlier this day, CT 06/26/2019 FINDINGS: Tip and side port of the enteric tube below the diaphragm, however appears looped in distended stomach with tip directed cranially gaseous gastric distension again seen. Gastric pneumatosis on prior exam is not visualized IMPRESSION: Tip and side port of the enteric tube below the diaphragm within distended stomach, however the tube appears looped with tip directed cranially. Consider retraction of approximately 10 cm. Electronically Signed   By: Keith Rake M.D.   On: 06/30/2019 00:26   DG Abd 1 View  Result Date: 06/29/2019 CLINICAL DATA:  NG tube placement EXAM: ABDOMEN - 1 VIEW COMPARISON:  None. FINDINGS: The stomach is distended. The enteric tube projects over the gastric body. The tip is pointed distally. There is no pneumatosis or free air. IMPRESSION: Enteric tube projects over the gastric body. The stomach is distended. Electronically Signed   By: Constance Holster M.D.   On: 06/29/2019 18:43   DG Abd 1 View  Result Date: 06/29/2019 CLINICAL DATA:  NG tube placement. EXAM: ABDOMEN - 1 VIEW COMPARISON:  06/29/2019 at 4:24 a.m. FINDINGS: Interval placement of nasogastric tube with tip over the stomach in the left mid abdomen.  Bowel gas pattern is nonobstructive. No free peritoneal air. Multiple surgical clips over the left lower quadrant. Remainder of the exam is unchanged. IMPRESSION: Nasogastric tube with tip over the stomach in the left mid abdomen. Nonobstructive bowel gas pattern. Electronically Signed   By: Marin Olp M.D.   On: 06/29/2019 11:04   DG Abd Portable 1V  Result Date: 06/29/2019 CLINICAL DATA:  Small bowel obstruction EXAM:  PORTABLE ABDOMEN - 1 VIEW COMPARISON:  06/28/2019 abdominal radiograph FINDINGS: Surgical clips overlie the left pelvis. Previously visualized enteric tube is not seen on this radiograph. Mildly dilated central small bowel loops up to 3.5 cm diameter, not definitely changed. No evidence of pneumatosis or pneumoperitoneum. No radiopaque nephrolithiasis. IMPRESSION: Mildly dilated central small bowel loops, not definitely changed, compatible with either low-grade mid to distal small bowel obstruction or mild ileus. Previously visualized enteric tube is not seen on this radiograph. Electronically Signed   By: Ilona Sorrel M.D.   On: 06/29/2019 05:24   DG Abd Portable 1V  Result Date: 06/28/2019 CLINICAL DATA:  Confirm NG tube placement. EXAM: PORTABLE ABDOMEN - 1 VIEW COMPARISON:  Radiographs earlier this day. CT 06/26/2019 FINDINGS: Tip and side port of the enteric tube below the diaphragm in the left upper quadrant, likely within distended stomach. There is decreased gaseous gastric distension from earlier this day. Gastric pneumatosis on prior CT is not well demonstrated. No obvious free air. IMPRESSION: Tip and side port of the enteric tube below the diaphragm in the left upper quadrant, likely within distended stomach. Decreased gaseous gastric distension from prior exam. Electronically Signed   By: Keith Rake M.D.   On: 06/28/2019 19:17   DG Abd Portable 1V-Small Bowel Obstruction Protocol-initial, 8 hr delay  Result Date: 06/28/2019 CLINICAL DATA:  Follow-up small-bowel obstruction. EXAM: PORTABLE ABDOMEN - 1 VIEW COMPARISON:  Prior abdomen same day. Abdomen 06/26/2019. CT 06/26/2019. FINDINGS: NG tube noted stable position with tip in the stomach. Persistent moderate gastric distention. Previously noted gastric pneumatosis best evaluated on prior CT. Distended loop of small bowel noted. Again distended fluid-filled loops may be present as noted on prior CT of 06/26/2019. Colonic gas pattern is  normal. Stool in the colon. No free air. Pelvic calcifications consistent phleboliths. Surgical clips in the pelvis. Degenerative changes lumbar spine and both hips. IMPRESSION: 1. NG tube noted stable position of the stomach. Persistent moderate gastric distention. Previously identified gastric pneumatosis again best identified by prior CT. 2. Distended loops of small bowel noted. Again distended fluid-filled loops may be present as noted on prior CT of 06/26/2019. Colonic gas pattern is normal. Electronically Signed   By: Marcello Moores  Register   On: 06/28/2019 10:32    Anti-infectives: Anti-infectives (From admission, onward)   Start     Dose/Rate Route Frequency Ordered Stop   06/27/19 1000  piperacillin-tazobactam (ZOSYN) IVPB 3.375 g  Status:  Discontinued     3.375 g 12.5 mL/hr over 240 Minutes Intravenous Every 8 hours 06/27/19 0807 06/29/19 1119   06/27/19 0200  piperacillin-tazobactam (ZOSYN) IVPB 2.25 g  Status:  Discontinued     2.25 g 100 mL/hr over 30 Minutes Intravenous Every 8 hours 06/26/19 2012 06/27/19 0807   06/26/19 1700  piperacillin-tazobactam (ZOSYN) IVPB 3.375 g     3.375 g 100 mL/hr over 30 Minutes Intravenous  Once 06/26/19 1651 06/26/19 1823      Assessment/Plan:  SBO with gastric distention and pneumatosis -h/o Carcinoid tumor s/p ex lapwith bilateral salpingo-oophorectomy, omentectomy and radical debulkingfor suspected  ovarian cancer 08/31/2017 Dr. Denman George - CT scan shows SBO likely related to adhesions; marked gastric distension, small bowel distension, associated with gastric pneumatosis and portal venous gas; calcified mesenteric lesion and right lower quadrant carcinoid  Will need continued NG given high output to avoid aspiration May require a laparotomy this week as well as picc line and TNA   LOS: 4 days    Coralie Keens MD 06/30/2019

## 2019-06-30 NOTE — Progress Notes (Signed)
NG tube in place with green out put in NG tube but not pulling to container.  Suction working and on low intermittent.  Dr. Ninfa Linden in room to see patient and gave order to flush NG tube as needed.  NG tube flushed by night RN and 100 ml green output immediately returned.   Sitter at bedside.  Will continue to monitor.

## 2019-06-30 NOTE — Progress Notes (Signed)
Replaced pt NGT per orders.  Pt very resistant but placement successful. Pt remains stable for now.

## 2019-06-30 NOTE — Progress Notes (Signed)
PROGRESS NOTE    Gloria Murphy  [id number] DOB: [birth year] DOA: 06/26/2019 PCP: Carlyle Basques, MD     Brief Narrative:  84 year old woman admitted from home on 2/17 due to nausea and vomiting with decreased p.o. intake.  In the ED she was found to have a large small bowel obstruction and gastric distention.  She was seen by surgery in the ED and NG tube was placed.  She has a history of a pelvic carcinoid tumor for which she underwent exploratory laparotomy in April 2019 with bilateral salpingo-oophorectomy and omentectomy as well as partial hysterectomy.   Assessment & Plan:   Principal Problem:   SBO (small bowel obstruction) (HCC) Active Problems:   Malignant carcinoid tumor of unknown primary site [facility])   Type 2 diabetes mellitus without complication, without long-term current use of insulin (HCC)   AKI (acute kidney injury) (HCC)   Nausea and vomiting   Abdominal pain   Lactic acidosis   Elevated troponin   Sinus tachycardia   Small bowel obstruction -Continues to intermittently pull out NG tube, but with high output. -Continue NG suction.  -Continue IV fluids. -If SBO not resolving by early next week, consider TPN vs palliative evaluation. -No clear evidence of intra-abdominal infection: DC Zosyn. -May need ex lap if no improvement; surgery following.   Sinus tachycardia -Suspect intravascular volume depletion due to high volume NG output. -She received a 500 cc bolus in addition to her maintenance IV fluids on 2/18 due to heart rates that were persistently in the 120s. -Heart rates today and overnight have been in the 90s.  Pelvic carcinoid tumor -Continue follow-up with GYN oncology, Dr. Denman George as an outpatient.  Acute renal failure -Likely due to intravascular volume depletion and acute tubular necrosis. -Creatinine on admission was 2.52, is down to 0.95 today. -Continue IV fluids  Type 2 diabetes -Managed at home with diet and exercise. -Continue  sensitive sliding scale. -Fair control so far while hospitalized.  Severe Hypokalemia -Likely due to NG suction. -K 2.9 today. -Continue IV repletion. -Mg ok at 2.5. -Recheck electrolytes in am.    DVT prophylaxis: SCDs Code Status: Full code Family Communication: Patient only Disposition Plan: Anticipate will need inpatient at least an additional 72 hours maybe more if requires surgery.  Consultants:   Surgery  Procedures:   NG tube placement 2/17  Antimicrobials:  Anti-infectives (From admission, onward)   Start     Dose/Rate Route Frequency Ordered Stop   06/27/19 1000  piperacillin-tazobactam (ZOSYN) IVPB 3.375 g  Status:  Discontinued     3.375 g 12.5 mL/hr over 240 Minutes Intravenous Every 8 hours 06/27/19 0807 06/29/19 1119   06/27/19 0200  piperacillin-tazobactam (ZOSYN) IVPB 2.25 g  Status:  Discontinued     2.25 g 100 mL/hr over 30 Minutes Intravenous Every 8 hours 06/26/19 2012 06/27/19 0807   06/26/19 1700  piperacillin-tazobactam (ZOSYN) IVPB 3.375 g     3.375 g 100 mL/hr over 30 Minutes Intravenous  Once 06/26/19 1651 06/26/19 1823       Subjective: Lying in bed, seems confused. Sitter at bedside as she removed NG several times yesterday. Also in restraints for same reason.  Objective: Vitals:   06/29/19 0559 06/29/19 1251 06/29/19 2114 06/30/19 0519  BP: (!) 149/76 (!) 154/66 (!) 144/72 (!) 151/71  Pulse: 97 (!) 112 (!) 102 91  Resp: 20 18 15 20   Temp: 98 F (36.7 C) 97.8 F (36.6 C) 99 F (37.2 C) 99.2 F (37.3 C)  TempSrc:  Oral  Axillary  SpO2: 100% 98% 98% 100%  Weight:      Height:        Intake/Output Summary (Last 24 hours) at 06/30/2019 1109 Last data filed at 06/30/2019 0900 Gross per 24 hour  Intake 2261.11 ml  Output 2800 ml  Net -538.89 ml   Filed Weights   06/26/19 2057  Weight: 66.1 kg    Examination:  General exam: Awake, confused Respiratory system: Clear to auscultation. Respiratory effort  normal. Cardiovascular system: Tachycardic, regular rhythm. No murmurs, rubs, gallops. Gastrointestinal system: NG tube is in place abdomen is distended, soft and nontender to palpation. Hypoactive bowel sounds heard. Central nervous system: Moves all 4 spontaneously. Extremities: No C/C/E, +pedal pulses Skin: No rashes, lesions or ulcers Psychiatry: Unable to assess given current mental state.    Data Reviewed: I have personally reviewed following labs and imaging studies  CBC: Recent Labs  Lab 06/26/19 1352 06/27/19 0316 06/28/19 0419 06/29/19 0546 06/30/19 0454  WBC 6.4 5.7 9.6 10.7* 8.0  HGB 14.5 14.2 13.9 12.5 10.8*  HCT 44.6 44.2 43.2 40.8 34.6*  MCV 79.2* 80.8 81.5 83.3 83.2  PLT 254 167 177 188 [id number]   Basic Metabolic Panel: Recent Labs  Lab 06/26/19 1352 06/26/19 2107 06/27/19 0316 06/28/19 0419 06/29/19 0546 06/30/19 0454  NA 136  --  139 144 149* 146*  K 3.7  --  3.7 3.3* 2.5* 2.9*  CL 88*  --  95* 93* 95* 100  CO2 28  --  27 37* 36* 34*  GLUCOSE 233*  --  197* 172* 132* 104*  BUN 48*  --  51* 53* 45* 28*  CREATININE 2.52*  --  2.15* 1.99* 1.34* 0.95  CALCIUM 10.2  --  9.3 9.5 9.4 8.9  MG  --  2.3 2.3  --  2.5*  --    GFR: Estimated Creatinine Clearance: 48.5 mL/min (by C-G formula based on SCr of 0.95 mg/dL). Liver Function Tests: Recent Labs  Lab 06/26/19 1352 06/27/19 0316  AST 31 25  ALT 22 19  ALKPHOS 85 71  BILITOT 1.3* 2.3*  PROT 9.6* 7.8  ALBUMIN 3.8 3.0*   Recent Labs  Lab 06/26/19 1352  LIPASE 38   No results for input(s): AMMONIA in the last 168 hours. Coagulation Profile: Recent Labs  Lab 06/27/19 0316  INR 1.0   Cardiac Enzymes: No results for input(s): CKTOTAL, CKMB, CKMBINDEX, TROPONINI in the last 168 hours. BNP (last 3 results) No results for input(s): PROBNP in the last 8760 hours. HbA1C: No results for input(s): HGBA1C in the last 72 hours. CBG: Recent Labs  Lab 06/29/19 0556 06/29/19 1143 06/29/19 1704  06/30/19 0004 06/30/19 0526  GLUCAP 115* 124* 108* 111* 100*   Lipid Profile: No results for input(s): CHOL, HDL, LDLCALC, TRIG, CHOLHDL, LDLDIRECT in the last 72 hours. Thyroid Function Tests: No results for input(s): TSH, T4TOTAL, FREET4, T3FREE, THYROIDAB in the last 72 hours. Anemia Panel: No results for input(s): VITAMINB12, FOLATE, FERRITIN, TIBC, IRON, RETICCTPCT in the last 72 hours. Urine analysis:    Component Value Date/Time   COLORURINE YELLOW 08/28/2017 1057   APPEARANCEUR HAZY (A) 08/28/2017 1057   LABSPEC 1.020 10/25/2017 1931   PHURINE 5.5 10/25/2017 1931   GLUCOSEU NEGATIVE 10/25/2017 1931   HGBUR NEGATIVE 10/25/2017 1931   BILIRUBINUR NEGATIVE 10/25/2017 1931   KETONESUR NEGATIVE 10/25/2017 1931   PROTEINUR NEGATIVE 10/25/2017 1931   UROBILINOGEN 0.2 10/25/2017 1931   NITRITE NEGATIVE 10/25/2017 1931  LEUKOCYTESUR SMALL (A) 10/25/2017 1931   Sepsis Labs: @LABRCNTIP (procalcitonin:4,lacticidven:4)  ) Recent Results (from the past 240 hour(s))  Novel Coronavirus, NAA (Hosp order, Send-out to Ref Lab; TAT 18-24 hrs     Status: None   Collection Time: 06/24/19  9:16 AM   Specimen: Nasopharyngeal Swab; Respiratory  Result Value Ref Range Status   SARS-CoV-2, NAA NOT DETECTED NOT DETECTED Final    Comment: (NOTE) This nucleic acid amplification test was developed and its performance characteristics determined by [employer]. Nucleic acid amplification tests include RT-PCR and TMA. This test has not been FDA cleared or approved. This test has been authorized by FDA under an Emergency Use Authorization (EUA). This test is only authorized for the duration of time the declaration that circumstances exist justifying the authorization of the emergency use of in vitro diagnostic tests for detection of SARS-CoV-2 virus and/or diagnosis of COVID-19 infection under section 564(b)(1) of the Act, 21 U.S.C. [id number]) (1), unless the authorization is terminated  or revoked sooner. When diagnostic testing is negative, the possibility of a false negative result should be considered in the context of a patient's recent exposures and the presence of clinical signs and symptoms consistent with COVID-19. An individual without symptoms of COVID- 19 and who is not shedding SARS-CoV-2  virus would expect to have a negative (not detected) result in this assay. Performed At: [facility] [address] [place], [place] [id number] Rush Farmer MD [id number]    [place]  Final    Comment: Performed at [facility], [place] [address], [place], [place] [address]  Blood culture (routine x 2)     Status: None (Preliminary result)   Collection Time: 06/26/19  5:33 PM   Specimen: BLOOD  Result Value Ref Range Status   Specimen Description BLOOD LEFT ANTECUBITAL  Final   Special Requests   Final    BOTTLES DRAWN AEROBIC AND ANAEROBIC Blood Culture adequate volume Performed at [facility] [address], [place], [place] [address]    Culture NO GROWTH 3 DAYS  Final   Report Status PENDING  Incomplete  Respiratory Panel by RT PCR (Flu A&B, Covid) - Nasopharyngeal Swab     Status: None   Collection Time: 06/26/19  6:07 PM   Specimen: Nasopharyngeal Swab  Result Value Ref Range Status   SARS Coronavirus 2 by RT PCR NEGATIVE NEGATIVE Final    Comment: (NOTE) SARS-CoV-2 target nucleic acids are NOT DETECTED. The SARS-CoV-2 RNA is generally detectable in upper respiratoy specimens during the acute phase of infection. The lowest concentration of SARS-CoV-2 viral copies this assay can detect is 131 copies/mL. A negative result does not preclude SARS-Cov-2 infection and should not be used as the sole basis for treatment or other patient management decisions. A negative result may occur with  improper specimen collection/handling, submission of specimen other than nasopharyngeal swab, presence of viral  mutation(s) within the areas targeted by this assay, and inadequate number of viral copies (<131 copies/mL). A negative result must be combined with clinical observations, patient history, and epidemiological information. The expected result is Negative. Fact Sheet for Patients:  PinkCheek.be Fact Sheet for Healthcare Providers:  GravelBags.it This test is not yet ap proved or cleared by the [place] FDA and  has been authorized for detection and/or diagnosis of SARS-CoV-2 by FDA under an Emergency Use Authorization (EUA). This EUA will remain  in effect (meaning this test can be used) for the duration of the COVID-19 declaration  under Section 564(b)(1) of the Act, 21 U.S.C. section 360bbb-3(b)(1), unless the authorization is terminated or revoked sooner.    Influenza A by PCR NEGATIVE NEGATIVE Final   Influenza B by PCR NEGATIVE NEGATIVE Final    Comment: (NOTE) The Xpert Xpress SARS-CoV-2/FLU/RSV assay is intended as an aid in  the diagnosis of influenza from Nasopharyngeal swab specimens and  should not be used as a sole basis for treatment. Nasal washings and  aspirates are unacceptable for Xpert Xpress SARS-CoV-2/FLU/RSV  testing. Fact Sheet for Patients: PinkCheek.be Fact Sheet for Healthcare Providers: GravelBags.it This test is not yet approved or cleared by the [place] FDA and  has been authorized for detection and/or diagnosis of SARS-CoV-2 by  FDA under an Emergency Use Authorization (EUA). This EUA will remain  in effect (meaning this test can be used) for the duration of the  Covid-19 declaration under Section 564(b)(1) of the Act, 21  U.S.C. section 360bbb-3(b)(1), unless the authorization is  terminated or revoked. Performed at [facility], [place] [address], [place], [place] [address]   Blood culture (routine x 2)      Status: None (Preliminary result)   Collection Time: 06/26/19  6:12 PM   Specimen: Site Not Specified; Blood  Result Value Ref Range Status   Specimen Description SITE NOT SPECIFIED  Final   Special Requests   Final    BOTTLES DRAWN AEROBIC AND ANAEROBIC Blood Culture adequate volume Performed at [facility] [address] [place], [place] [address]    Culture NO GROWTH 3 DAYS  Final   Report Status PENDING  Incomplete         Radiology Studies: DG Abd 1 View  Result Date: 06/30/2019 CLINICAL DATA:  NG tube repositioning EXAM: ABDOMEN - 1 VIEW COMPARISON:  06/30/2019 FINDINGS: The enteric tube remains looped within the gastric body with the tip projecting over the gastric fundus. The tip is pointed towards the GE junction. IMPRESSION: NG tube as above.  The stomach remains distended. Electronically Signed   By: Constance Holster M.D.   On: 06/30/2019 02:04   DG Abd 1 View  Result Date: 06/30/2019 CLINICAL DATA:  Repositioning of NG tube. EXAM: ABDOMEN - 1 VIEW COMPARISON:  06/30/2019 FINDINGS: The enteric tube tip projects over the gastric body/fundus. The tube is coiled within the stomach. Consider retracting the tube by approximately 10-15 cm. Again noted is a distended stomach. IMPRESSION: 1. NG tube as above. 2. Distended stomach. Electronically Signed   By: Constance Holster M.D.   On: 06/30/2019 01:20   DG Abd 1 View  Result Date: 06/30/2019 CLINICAL DATA:  NG tube placement, patient pulled out previous NG tube. EXAM: ABDOMEN - 1 VIEW COMPARISON:  Radiograph earlier this day, CT 06/26/2019 FINDINGS: Tip and side port of the enteric tube below the diaphragm, however appears looped in distended stomach with tip directed cranially gaseous gastric distension again seen. Gastric pneumatosis on prior exam is not visualized IMPRESSION: Tip and side port of the enteric tube below the diaphragm within distended stomach, however the tube appears looped with tip  directed cranially. Consider retraction of approximately 10 cm. Electronically Signed   By: Keith Rake M.D.   On: 06/30/2019 00:26   DG Abd 1 View  Result Date: 06/29/2019 CLINICAL DATA:  NG tube placement EXAM: ABDOMEN - 1 VIEW COMPARISON:  None. FINDINGS: The stomach is distended. The enteric tube projects over the gastric body. The tip is pointed distally. There is no  pneumatosis or free air. IMPRESSION: Enteric tube projects over the gastric body. The stomach is distended. Electronically Signed   By: Constance Holster M.D.   On: 06/29/2019 18:43   DG Abd 1 View  Result Date: 06/29/2019 CLINICAL DATA:  NG tube placement. EXAM: ABDOMEN - 1 VIEW COMPARISON:  06/29/2019 at 4:24 a.m. FINDINGS: Interval placement of nasogastric tube with tip over the stomach in the left mid abdomen. Bowel gas pattern is nonobstructive. No free peritoneal air. Multiple surgical clips over the left lower quadrant. Remainder of the exam is unchanged. IMPRESSION: Nasogastric tube with tip over the stomach in the left mid abdomen. Nonobstructive bowel gas pattern. Electronically Signed   By: Marin Olp M.D.   On: 06/29/2019 11:04   DG Abd Portable 1V  Result Date: 06/29/2019 CLINICAL DATA:  Small bowel obstruction EXAM: PORTABLE ABDOMEN - 1 VIEW COMPARISON:  06/28/2019 abdominal radiograph FINDINGS: Surgical clips overlie the left pelvis. Previously visualized enteric tube is not seen on this radiograph. Mildly dilated central small bowel loops up to 3.5 cm diameter, not definitely changed. No evidence of pneumatosis or pneumoperitoneum. No radiopaque nephrolithiasis. IMPRESSION: Mildly dilated central small bowel loops, not definitely changed, compatible with either low-grade mid to distal small bowel obstruction or mild ileus. Previously visualized enteric tube is not seen on this radiograph. Electronically Signed   By: Ilona Sorrel M.D.   On: 06/29/2019 05:24   DG Abd Portable 1V  Result Date:  06/28/2019 CLINICAL DATA:  Confirm NG tube placement. EXAM: PORTABLE ABDOMEN - 1 VIEW COMPARISON:  Radiographs earlier this day. CT 06/26/2019 FINDINGS: Tip and side port of the enteric tube below the diaphragm in the left upper quadrant, likely within distended stomach. There is decreased gaseous gastric distension from earlier this day. Gastric pneumatosis on prior CT is not well demonstrated. No obvious free air. IMPRESSION: Tip and side port of the enteric tube below the diaphragm in the left upper quadrant, likely within distended stomach. Decreased gaseous gastric distension from prior exam. Electronically Signed   By: Keith Rake M.D.   On: 06/28/2019 19:17        Scheduled Meds: . insulin aspart  0-6 Units Subcutaneous Q6H  . mouth rinse  15 mL Mouth Rinse BID  . sodium chloride flush  3 mL Intravenous Once  . sodium chloride flush  3 mL Intravenous Q12H   Continuous Infusions: . lactated ringers 100 mL/hr at 06/30/19 0340  . potassium chloride 10 mEq (06/30/19 0956)     LOS: 4 days    Time spent: 25 minutes. Greater than 50% of this time was spent in direct contact with the patient, coordinating care and discussing relevant ongoing clinical issues.    Lelon Frohlich, MD [facility] Pager [phone]  If 7PM-7AM, please contact night-coverage [email or web address] Password [facility] 06/30/2019, 11:09 AM

## 2019-07-01 ENCOUNTER — Encounter (HOSPITAL_COMMUNITY)
Admission: EM | Disposition: A | Discharge status: Skilled Nursing Facility | Payer: Self-pay | Source: Home / Self Care | Attending: Internal Medicine

## 2019-07-01 ENCOUNTER — Inpatient Hospital Stay: Payer: Self-pay

## 2019-07-01 ENCOUNTER — Inpatient Hospital Stay (HOSPITAL_COMMUNITY): Payer: Medicare Other | Admitting: Anesthesiology

## 2019-07-01 HISTORY — PX: LAPAROTOMY: SHX154

## 2019-07-01 LAB — GLUCOSE, CAPILLARY
Glucose-Capillary: 131 mg/dL — ABNORMAL HIGH (ref 70–99)
Glucose-Capillary: 141 mg/dL — ABNORMAL HIGH (ref 70–99)
Glucose-Capillary: 160 mg/dL — ABNORMAL HIGH (ref 70–99)
Glucose-Capillary: 176 mg/dL — ABNORMAL HIGH (ref 70–99)
Glucose-Capillary: 294 mg/dL — ABNORMAL HIGH (ref 70–99)

## 2019-07-01 LAB — BASIC METABOLIC PANEL
Anion gap: 10 (ref 5–15)
BUN: 17 mg/dL (ref 8–23)
CO2: 28 mmol/L (ref 22–32)
Calcium: 8.7 mg/dL — ABNORMAL LOW (ref 8.9–10.3)
Chloride: 103 mmol/L (ref 98–111)
Creatinine, Ser: 0.72 mg/dL (ref 0.44–1.00)
GFR calc Af Amer: >60 mL/min (ref >60)
GFR calc non Af Amer: >60 mL/min (ref >60)
Glucose, Bld: 157 mg/dL — ABNORMAL HIGH (ref 70–99)
Potassium: 2.8 mmol/L — ABNORMAL LOW (ref 3.5–5.1)
Sodium: 141 mmol/L (ref 135–145)

## 2019-07-01 LAB — CULTURE, BLOOD (ROUTINE X 2)
Culture: NO GROWTH
Culture: NO GROWTH
Special Requests: ADEQUATE
Special Requests: ADEQUATE

## 2019-07-01 LAB — POCT I-STAT, CHEM 8
BUN: 12 mg/dL (ref 8–23)
Calcium, Ion: 1.18 mmol/L (ref 1.15–1.40)
Chloride: 101 mmol/L (ref 98–111)
Creatinine, Ser: 0.7 mg/dL (ref 0.44–1.00)
Glucose, Bld: 174 mg/dL — ABNORMAL HIGH (ref 70–99)
HCT: 34 % — ABNORMAL LOW (ref 36.0–46.0)
Hemoglobin: 11.6 g/dL — ABNORMAL LOW (ref 12.0–15.0)
Potassium: 3.1 mmol/L — ABNORMAL LOW (ref 3.5–5.1)
Sodium: 140 mmol/L (ref 135–145)
TCO2: 29 mmol/L (ref 22–32)

## 2019-07-01 LAB — PREPARE RBC (CROSSMATCH)

## 2019-07-01 SURGERY — LAPAROTOMY, EXPLORATORY
Anesthesia: General

## 2019-07-01 MED ORDER — PROPOFOL 10 MG/ML IV BOLUS
INTRAVENOUS | Status: AC
  Filled 2019-07-01: qty 20

## 2019-07-01 MED ORDER — ACETAMINOPHEN 10 MG/ML IV SOLN
1000.0000 mg | Freq: Four times a day (QID) | INTRAVENOUS | Status: DC
  Administered 2019-07-01 – 2019-07-02 (×3): 1000 mg via INTRAVENOUS
  Filled 2019-07-01 (×4): qty 100

## 2019-07-01 MED ORDER — PHENYLEPHRINE 40 MCG/ML (10ML) SYRINGE FOR IV PUSH (FOR BLOOD PRESSURE SUPPORT)
PREFILLED_SYRINGE | INTRAVENOUS | Status: AC
  Filled 2019-07-01: qty 10

## 2019-07-01 MED ORDER — TRAVASOL 10 % IV SOLN
INTRAVENOUS | Status: AC
  Filled 2019-07-01: qty 378

## 2019-07-01 MED ORDER — CHLORHEXIDINE GLUCONATE CLOTH 2 % EX PADS
6.0000 | MEDICATED_PAD | Freq: Every day | CUTANEOUS | Status: DC
  Administered 2019-07-02 – 2019-07-03 (×2): 6 via TOPICAL

## 2019-07-01 MED ORDER — SUCCINYLCHOLINE CHLORIDE 200 MG/10ML IV SOSY
PREFILLED_SYRINGE | INTRAVENOUS | Status: AC
  Filled 2019-07-01: qty 10

## 2019-07-01 MED ORDER — ALBUMIN HUMAN 5 % IV SOLN
INTRAVENOUS | Status: AC
  Filled 2019-07-01: qty 250

## 2019-07-01 MED ORDER — SUCCINYLCHOLINE CHLORIDE 200 MG/10ML IV SOSY
PREFILLED_SYRINGE | INTRAVENOUS | Status: DC | PRN
  Administered 2019-07-01: 100 mg via INTRAVENOUS

## 2019-07-01 MED ORDER — DEXTROSE-NACL 5-0.45 % IV SOLN: INTRAVENOUS | Status: AC

## 2019-07-01 MED ORDER — POTASSIUM CHLORIDE 10 MEQ/100ML IV SOLN
10.0000 meq | INTRAVENOUS | Status: AC
  Administered 2019-07-01 (×4): 10 meq via INTRAVENOUS
  Filled 2019-07-01 (×2): qty 100

## 2019-07-01 MED ORDER — DEXAMETHASONE SODIUM PHOSPHATE 10 MG/ML IJ SOLN
INTRAMUSCULAR | Status: AC
  Filled 2019-07-01: qty 1

## 2019-07-01 MED ORDER — PROPOFOL 10 MG/ML IV BOLUS
INTRAVENOUS | Status: DC | PRN
  Administered 2019-07-01: 110 mg via INTRAVENOUS

## 2019-07-01 MED ORDER — POTASSIUM CHLORIDE 10 MEQ/100ML IV SOLN
10.0000 meq | Freq: Once | INTRAVENOUS | Status: AC
  Administered 2019-07-01: 20:00:00 10 meq via INTRAVENOUS
  Filled 2019-07-01: qty 100

## 2019-07-01 MED ORDER — FENTANYL CITRATE (PF) 100 MCG/2ML IJ SOLN
INTRAMUSCULAR | Status: AC
  Filled 2019-07-01: qty 2

## 2019-07-01 MED ORDER — SODIUM CHLORIDE 0.9 % IR SOLN
Status: DC | PRN
  Administered 2019-07-01: 2000 mL

## 2019-07-01 MED ORDER — LACTATED RINGERS IV SOLN: INTRAVENOUS | Status: DC

## 2019-07-01 MED ORDER — ONDANSETRON HCL 4 MG/2ML IJ SOLN
INTRAMUSCULAR | Status: AC
  Filled 2019-07-01: qty 2

## 2019-07-01 MED ORDER — FENTANYL CITRATE (PF) 100 MCG/2ML IJ SOLN: 25.0000 ug | INTRAMUSCULAR | Status: DC | PRN

## 2019-07-01 MED ORDER — ROCURONIUM BROMIDE 10 MG/ML (PF) SYRINGE
PREFILLED_SYRINGE | INTRAVENOUS | Status: DC | PRN
  Administered 2019-07-01: 40 mg via INTRAVENOUS

## 2019-07-01 MED ORDER — POTASSIUM CHLORIDE 10 MEQ/100ML IV SOLN
INTRAVENOUS | Status: DC | PRN
  Administered 2019-07-01: 10 meq via INTRAVENOUS

## 2019-07-01 MED ORDER — LABETALOL HCL 5 MG/ML IV SOLN
5.0000 mg | Freq: Four times a day (QID) | INTRAVENOUS | Status: DC | PRN
  Administered 2019-07-01 – 2019-07-08 (×6): 5 mg via INTRAVENOUS
  Filled 2019-07-01 (×7): qty 4

## 2019-07-01 MED ORDER — ONDANSETRON HCL 4 MG/2ML IJ SOLN
INTRAMUSCULAR | Status: DC | PRN
  Administered 2019-07-01: 4 mg via INTRAVENOUS

## 2019-07-01 MED ORDER — LIDOCAINE 2% (20 MG/ML) 5 ML SYRINGE
INTRAMUSCULAR | Status: DC | PRN
  Administered 2019-07-01: 1 mg/kg/h via INTRAVENOUS
  Administered 2019-07-01: 50 mg via INTRAVENOUS

## 2019-07-01 MED ORDER — METHOCARBAMOL 1000 MG/10ML IJ SOLN
500.0000 mg | Freq: Three times a day (TID) | INTRAVENOUS | Status: DC
  Administered 2019-07-01 – 2019-07-03 (×5): 500 mg via INTRAVENOUS
  Filled 2019-07-01: qty 5
  Filled 2019-07-01 (×2): qty 500
  Filled 2019-07-01: qty 5
  Filled 2019-07-01 (×3): qty 500

## 2019-07-01 MED ORDER — LIDOCAINE HCL 2 % IJ SOLN
INTRAMUSCULAR | Status: AC
  Filled 2019-07-01: qty 20

## 2019-07-01 MED ORDER — ROCURONIUM BROMIDE 10 MG/ML (PF) SYRINGE
PREFILLED_SYRINGE | INTRAVENOUS | Status: AC
  Filled 2019-07-01: qty 10

## 2019-07-01 MED ORDER — CHLORHEXIDINE GLUCONATE CLOTH 2 % EX PADS: 6.0000 | MEDICATED_PAD | Freq: Once | CUTANEOUS | Status: DC

## 2019-07-01 MED ORDER — DEXMEDETOMIDINE HCL IN NACL 200 MCG/50ML IV SOLN
INTRAVENOUS | Status: AC
  Filled 2019-07-01: qty 50

## 2019-07-01 MED ORDER — ALBUMIN HUMAN 5 % IV SOLN: INTRAVENOUS | Status: DC | PRN

## 2019-07-01 MED ORDER — SUGAMMADEX SODIUM 200 MG/2ML IV SOLN
INTRAVENOUS | Status: DC | PRN
  Administered 2019-07-01: 200 mg via INTRAVENOUS

## 2019-07-01 MED ORDER — DEXTROSE-NACL 5-0.45 % IV SOLN: INTRAVENOUS | Status: DC

## 2019-07-01 MED ORDER — SODIUM CHLORIDE 0.9% FLUSH
10.0000 mL | INTRAVENOUS | Status: DC | PRN
  Administered 2019-07-13: 10 mL

## 2019-07-01 MED ORDER — MORPHINE SULFATE (PF) 2 MG/ML IV SOLN
1.0000 mg | INTRAVENOUS | Status: DC | PRN
  Administered 2019-07-01: 19:00:00 2 mg via INTRAVENOUS
  Filled 2019-07-01: qty 1

## 2019-07-01 MED ORDER — CHLORHEXIDINE GLUCONATE CLOTH 2 % EX PADS
6.0000 | MEDICATED_PAD | Freq: Once | CUTANEOUS | Status: AC
  Administered 2019-07-01: 6 via TOPICAL

## 2019-07-01 MED ORDER — DEXAMETHASONE SODIUM PHOSPHATE 10 MG/ML IJ SOLN
INTRAMUSCULAR | Status: DC | PRN
  Administered 2019-07-01: 8 mg via INTRAVENOUS

## 2019-07-01 MED ORDER — PHENYLEPHRINE 40 MCG/ML (10ML) SYRINGE FOR IV PUSH (FOR BLOOD PRESSURE SUPPORT)
PREFILLED_SYRINGE | INTRAVENOUS | Status: DC | PRN
  Administered 2019-07-01: 80 ug via INTRAVENOUS

## 2019-07-01 MED ORDER — SODIUM CHLORIDE 0.9 % IV SOLN
2.0000 g | INTRAVENOUS | Status: AC
  Administered 2019-07-01: 2 g via INTRAVENOUS
  Filled 2019-07-01: qty 2

## 2019-07-01 MED ORDER — FENTANYL CITRATE (PF) 100 MCG/2ML IJ SOLN
INTRAMUSCULAR | Status: DC | PRN
  Administered 2019-07-01 (×2): 25 ug via INTRAVENOUS
  Administered 2019-07-01: 50 ug via INTRAVENOUS
  Administered 2019-07-01: 25 ug via INTRAVENOUS

## 2019-07-01 MED ORDER — LIDOCAINE 2% (20 MG/ML) 5 ML SYRINGE
INTRAMUSCULAR | Status: AC
  Filled 2019-07-01: qty 5

## 2019-07-01 SURGICAL SUPPLY — 41 items
APL SWBSTK 6 STRL LF DISP (MISCELLANEOUS) ×1
APPLICATOR COTTON TIP 6 STRL (MISCELLANEOUS) ×1 IMPLANT
APPLICATOR COTTON TIP 6IN STRL (MISCELLANEOUS) ×3
BLADE EXTENDED COATED 6.5IN (ELECTRODE) IMPLANT
BLADE HEX COATED 2.75 (ELECTRODE) ×3 IMPLANT
COVER MAYO STAND STRL (DRAPES) IMPLANT
COVER SURGICAL LIGHT HANDLE (MISCELLANEOUS) ×2 IMPLANT
COVER WAND RF STERILE (DRAPES) IMPLANT
DRAPE LAPAROSCOPIC ABDOMINAL (DRAPES) ×3 IMPLANT
DRAPE WARM FLUID 44X44 (DRAPES) IMPLANT
DRSG OPSITE POSTOP 4X10 (GAUZE/BANDAGES/DRESSINGS) ×2 IMPLANT
DRSG OPSITE POSTOP 4X12 (GAUZE/BANDAGES/DRESSINGS) ×2 IMPLANT
ELECT REM PT RETURN 15FT ADLT (MISCELLANEOUS) ×3 IMPLANT
GAUZE SPONGE 4X4 12PLY STRL (GAUZE/BANDAGES/DRESSINGS) ×3 IMPLANT
GLOVE BIO SURGEON STRL SZ7.5 (GLOVE) ×6 IMPLANT
GLOVE BIOGEL PI IND STRL 7.0 (GLOVE) ×1 IMPLANT
GLOVE BIOGEL PI INDICATOR 7.0 (GLOVE) ×2
GOWN STRL REUS W/ TWL XL LVL3 (GOWN DISPOSABLE) ×1 IMPLANT
GOWN STRL REUS W/TWL LRG LVL3 (GOWN DISPOSABLE) ×3 IMPLANT
GOWN STRL REUS W/TWL XL LVL3 (GOWN DISPOSABLE) ×6 IMPLANT
HANDLE SUCTION POOLE (INSTRUMENTS) IMPLANT
KIT BASIN OR (CUSTOM PROCEDURE TRAY) ×3 IMPLANT
KIT TURNOVER KIT A (KITS) IMPLANT
NS IRRIG 1000ML POUR BTL (IV SOLUTION) ×3 IMPLANT
PACK GENERAL/GYN (CUSTOM PROCEDURE TRAY) ×3 IMPLANT
PENCIL SMOKE EVACUATOR (MISCELLANEOUS) IMPLANT
SPONGE LAP 18X18 RF (DISPOSABLE) IMPLANT
STAPLER VISISTAT 35W (STAPLE) ×3 IMPLANT
SUCTION POOLE HANDLE (INSTRUMENTS)
SUT PDS AB 1 CTX 36 (SUTURE) IMPLANT
SUT PDS AB 1 TP1 96 (SUTURE) ×4 IMPLANT
SUT SILK 2 0 (SUTURE)
SUT SILK 2 0 SH CR/8 (SUTURE) IMPLANT
SUT SILK 2-0 18XBRD TIE 12 (SUTURE) IMPLANT
SUT SILK 3 0 (SUTURE)
SUT SILK 3 0 SH CR/8 (SUTURE) IMPLANT
SUT SILK 3-0 18XBRD TIE 12 (SUTURE) IMPLANT
TOWEL OR 17X26 10 PK STRL BLUE (TOWEL DISPOSABLE) ×6 IMPLANT
TRAY FOLEY MTR SLVR 16FR STAT (SET/KITS/TRAYS/PACK) IMPLANT
WATER STERILE IRR 1000ML POUR (IV SOLUTION) ×3 IMPLANT
YANKAUER SUCT BULB TIP NO VENT (SUCTIONS) IMPLANT

## 2019-07-01 NOTE — Anesthesia Procedure Notes (Signed)
Procedure Name: Intubation Date/Time: 07/01/2019 12:02 PM Performed by: Victoriano Lain, CRNA Pre-anesthesia Checklist: Patient identified, Emergency Drugs available, Suction available, Patient being monitored and Timeout performed Patient Re-evaluated:Patient Re-evaluated prior to induction Oxygen Delivery Method: Circle system utilized Preoxygenation: Pre-oxygenation with 100% oxygen Induction Type: IV induction, Cricoid Pressure applied and Rapid sequence Laryngoscope Size: Mac and 4 Grade View: Grade I Tube type: Oral Tube size: 7.5 mm Number of attempts: 1 Airway Equipment and Method: Stylet Placement Confirmation: ETT inserted through vocal cords under direct vision,  positive ETCO2 and breath sounds checked- equal and bilateral Secured at: 21 cm Tube secured with: Tape Dental Injury: Teeth and Oropharynx as per pre-operative assessment  Comments: RSI with cricoid pressure by DR Nyoka Cowden. DL X 1 with MAC 4. Grade 1 view. NGT to LWS. Chunky particulate matter noted in the oral pharynx and near the vocal cords. +ETCO2, BBS=. ATOI. ETT secured at 21 cm at the lip.

## 2019-07-01 NOTE — Anesthesia Postprocedure Evaluation (Signed)
Anesthesia Post Note  Patient: Gloria Murphy  Procedure(s) Performed: EXPLORATORY LAPAROTOMY (N/A )     Patient location during evaluation: PACU Anesthesia Type: General Level of consciousness: awake Pain management: pain level controlled Vital Signs Assessment: post-procedure vital signs reviewed and stable Respiratory status: spontaneous breathing Postop Assessment: no apparent nausea or vomiting Anesthetic complications: no    Last Vitals:  Vitals:   07/01/19 1733 07/01/19 1816  BP: (!) 177/90 (!) 171/85  Pulse: 77 84  Resp: 18 18  Temp: 36.6 C 36.9 C  SpO2: 100% 100%    Last Pain:  Vitals:   07/01/19 1816  TempSrc: Oral  PainSc:                  Seraphine Gudiel

## 2019-07-01 NOTE — Progress Notes (Signed)
PT Cancellation Note  Patient Details Name: Gloria Murphy MRN: [id number] DOB: 1935/12/27   Cancelled Treatment:    Reason Eval/Treat Not Completed: Patient declined, no reason specified(Pt resting in bed, husband at bedside. Pt declined to participate in therapy today due to pain. Pt educated on benefits of mobility and verabilized understanding but contineud to decline. Will follow up at later date/time as schedule allows.)   Verner Mould, DPT Physical Therapist with [facility] [phone]  07/01/2019 4:53 PM

## 2019-07-01 NOTE — Transfer of Care (Signed)
Immediate Anesthesia Transfer of Care Note  Patient: Gloria Murphy  Procedure(s) Performed: EXPLORATORY LAPAROTOMY (N/A )  Patient Location: PACU  Anesthesia Type:General  Level of Consciousness: awake, alert  and patient cooperative  Airway & Oxygen Therapy: Patient Spontanous Breathing and Patient connected to face mask oxygen  Post-op Assessment: Report given to RN, Post -op Vital signs reviewed and stable and Patient moving all extremities  Post vital signs: Reviewed and stable  Last Vitals:  Vitals Value Taken Time  BP 193/94 07/01/19 1319  Temp    Pulse 96 07/01/19 1321  Resp 30 07/01/19 1321  SpO2 100 % 07/01/19 1321  Vitals shown include unvalidated device data.  Last Pain:  Vitals:   07/01/19 0634  TempSrc: Oral  PainSc:       Patients Stated Pain Goal: 2 ([id number])  Complications: No apparent anesthesia complications

## 2019-07-01 NOTE — Progress Notes (Signed)
PROGRESS NOTE    Gloria Murphy  [id number] DOB: April 28, 1936 DOA: 06/26/2019 PCP: Carlyle Basques, MD     Brief Narrative:  84 year old woman admitted from home on 2/17 due to nausea and vomiting with decreased p.o. intake.  In the ED she was found to have a large small bowel obstruction and gastric distention.  She was seen by surgery in the ED and NG tube was placed.  She has a history of a pelvic carcinoid tumor for which she underwent exploratory laparotomy in April 2019 with bilateral salpingo-oophorectomy and omentectomy as well as partial hysterectomy.   Assessment & Plan:   Principal Problem:   SBO (small bowel obstruction) (HCC) Active Problems:   Malignant carcinoid tumor of unknown primary site [facility])   Type 2 diabetes mellitus without complication, without long-term current use of insulin (HCC)   AKI (acute kidney injury) (HCC)   Nausea and vomiting   Abdominal pain   Lactic acidosis   Elevated troponin   Sinus tachycardia   Small bowel obstruction -To OR today for ex lap. -PICC line to be placed for consideration of TPN. -Continue IV fluids. -No clear evidence of intra-abdominal infection: DC Zosyn.   Sinus tachycardia -Suspect intravascular volume depletion due to high volume NG output. -Heart rates currently 70s-80s.  Pelvic carcinoid tumor -Continue follow-up with GYN oncology, Dr. Denman George as an outpatient.  Acute renal failure -Likely due to intravascular volume depletion and acute tubular necrosis. -Creatinine on admission was 2.52, is down to 0.72 today. -Continue IV fluids  Type 2 diabetes -Managed at home with diet and exercise. -Continue sensitive sliding scale. -Fair control so far while hospitalized.  Severe Hypokalemia -Likely due to NG suction. -K 2.8 today. -Continue IV repletion. -Mg ok at 2.5. -Recheck electrolytes in am.    DVT prophylaxis: SCDs Code Status: Full code Family Communication: Patient only Disposition Plan: To  OR today.  Consultants:   Surgery  Procedures:   NG tube placement 2/17  Antimicrobials:  Anti-infectives (From admission, onward)   Start     Dose/Rate Route Frequency Ordered Stop   07/01/19 1015  cefoTEtan (CEFOTAN) 2 g in sodium chloride 0.9 % 100 mL IVPB     2 g 200 mL/hr over 30 Minutes Intravenous On call to O.R. 07/01/19 0847 07/02/19 0559   06/27/19 1000  piperacillin-tazobactam (ZOSYN) IVPB 3.375 g  Status:  Discontinued     3.375 g 12.5 mL/hr over 240 Minutes Intravenous Every 8 hours 06/27/19 0807 06/29/19 1119   06/27/19 0200  piperacillin-tazobactam (ZOSYN) IVPB 2.25 g  Status:  Discontinued     2.25 g 100 mL/hr over 30 Minutes Intravenous Every 8 hours 06/26/19 2012 06/27/19 0807   06/26/19 1700  piperacillin-tazobactam (ZOSYN) IVPB 3.375 g     3.375 g 100 mL/hr over 30 Minutes Intravenous  Once 06/26/19 1651 06/26/19 1823       Subjective: Lying in bed, seems confused. Wrist restraints present as she has removed several NG tubes with her confusion.  Objective: Vitals:   06/30/19 0519 06/30/19 1405 06/30/19 2045 07/01/19 0634  BP: (!) 151/71 140/75 (!) 173/87 (!) 144/69  Pulse: 91 (!) 101 87 84  Resp: 20 16    Temp: 99.2 F (37.3 C) 98.8 F (37.1 C) 98.5 F (36.9 C) 98.4 F (36.9 C)  TempSrc: Axillary Oral Oral Oral  SpO2: 100% 100% 100% 100%  Weight:      Height:        Intake/Output Summary (Last 24 hours) at 07/01/2019  [place] filed at 07/01/2019 0635 Gross per 24 hour  Intake 2068.12 ml  Output 4000 ml  Net -1931.88 ml   Filed Weights   06/26/19 2057  Weight: 66.1 kg    Examination:  General exam: Awake, confused Respiratory system: Clear to auscultation. Respiratory effort normal. Cardiovascular system: Tachycardic, regular rhythm. No murmurs, rubs, gallops. Gastrointestinal system: NG tube is in place abdomen is distended, soft and nontender to palpation. Hypoactive bowel sounds heard. Central nervous system: Moves all 4  spontaneously. Extremities: No C/C/E, +pedal pulses Skin: No rashes, lesions or ulcers Psychiatry: Unable to assess given current mental state.    Data Reviewed: I have personally reviewed following labs and imaging studies  CBC: Recent Labs  Lab 06/26/19 1352 06/27/19 0316 06/28/19 0419 06/29/19 0546 06/30/19 0454  WBC 6.4 5.7 9.6 10.7* 8.0  HGB 14.5 14.2 13.9 12.5 10.8*  HCT 44.6 44.2 43.2 40.8 34.6*  MCV 79.2* 80.8 81.5 83.3 83.2  PLT 254 167 177 188 [id number]   Basic Metabolic Panel: Recent Labs  Lab 06/26/19 1352 06/26/19 2107 06/27/19 0316 06/28/19 0419 06/29/19 0546 06/30/19 0454 07/01/19 0438  NA   < >  --  139 144 149* 146* 141  K   < >  --  3.7 3.3* 2.5* 2.9* 2.8*  CL   < >  --  95* 93* 95* 100 103  CO2   < >  --  27 37* 36* 34* 28  GLUCOSE   < >  --  197* 172* 132* 104* 157*  BUN   < >  --  51* 53* 45* 28* 17  CREATININE   < >  --  2.15* 1.99* 1.34* 0.95 0.72  CALCIUM   < >  --  9.3 9.5 9.4 8.9 8.7*  MG  --  2.3 2.3  --  2.5*  --   --    < > = values in this interval not displayed.   GFR: Estimated Creatinine Clearance: 57.6 mL/min (by C-G formula based on SCr of 0.72 mg/dL). Liver Function Tests: Recent Labs  Lab 06/26/19 1352 06/27/19 0316  AST 31 25  ALT 22 19  ALKPHOS 85 71  BILITOT 1.3* 2.3*  PROT 9.6* 7.8  ALBUMIN 3.8 3.0*   Recent Labs  Lab 06/26/19 1352  LIPASE 38   No results for input(s): AMMONIA in the last 168 hours. Coagulation Profile: Recent Labs  Lab 06/27/19 0316  INR 1.0   Cardiac Enzymes: No results for input(s): CKTOTAL, CKMB, CKMBINDEX, TROPONINI in the last 168 hours. BNP (last 3 results) No results for input(s): PROBNP in the last 8760 hours. HbA1C: No results for input(s): HGBA1C in the last 72 hours. CBG: Recent Labs  Lab 06/30/19 0526 06/30/19 1143 06/30/19 1748 07/01/19 0101 07/01/19 0632  GLUCAP 100* 86 77 131* 160*   Lipid Profile: No results for input(s): CHOL, HDL, LDLCALC, TRIG, CHOLHDL,  LDLDIRECT in the last 72 hours. Thyroid Function Tests: No results for input(s): TSH, T4TOTAL, FREET4, T3FREE, THYROIDAB in the last 72 hours. Anemia Panel: No results for input(s): VITAMINB12, FOLATE, FERRITIN, TIBC, IRON, RETICCTPCT in the last 72 hours. Urine analysis:    Component Value Date/Time   COLORURINE YELLOW 08/28/2017 1057   APPEARANCEUR HAZY (A) 08/28/2017 1057   LABSPEC 1.020 10/25/2017 1931   PHURINE 5.5 10/25/2017 1931   GLUCOSEU NEGATIVE 10/25/2017 1931   HGBUR NEGATIVE 10/25/2017 1931   BILIRUBINUR NEGATIVE 10/25/2017 1931   KETONESUR NEGATIVE 10/25/2017 1931   PROTEINUR NEGATIVE  10/25/2017 1931   UROBILINOGEN 0.2 10/25/2017 1931   NITRITE NEGATIVE 10/25/2017 1931   LEUKOCYTESUR SMALL (A) 10/25/2017 1931   Sepsis Labs: @LABRCNTIP (procalcitonin:4,lacticidven:4)  ) Recent Results (from the past 240 hour(s))  Novel Coronavirus, NAA (Hosp order, Send-out to Ref Lab; TAT 18-24 hrs     Status: None   Collection Time: 06/24/19  9:16 AM   Specimen: Nasopharyngeal Swab; Respiratory  Result Value Ref Range Status   SARS-CoV-2, NAA NOT DETECTED NOT DETECTED Final    Comment: (NOTE) This nucleic acid amplification test was developed and its performance characteristics determined by [employer]. Nucleic acid amplification tests include RT-PCR and TMA. This test has not been FDA cleared or approved. This test has been authorized by FDA under an Emergency Use Authorization (EUA). This test is only authorized for the duration of time the declaration that circumstances exist justifying the authorization of the emergency use of in vitro diagnostic tests for detection of SARS-CoV-2 virus and/or diagnosis of COVID-19 infection under section 564(b)(1) of the Act, 21 U.S.C. [id number]) (1), unless the authorization is terminated or revoked sooner. When diagnostic testing is negative, the possibility of a false negative result should be considered in the context of a  patient's recent exposures and the presence of clinical signs and symptoms consistent with COVID-19. An individual without symptoms of COVID- 19 and who is not shedding SARS-CoV-2  virus would expect to have a negative (not detected) result in this assay. Performed At: [facility] [address] [place], [place] [id number] Rush Farmer MD [id number]    [place]  Final    Comment: Performed at [facility], [place] [address], [place], [place] [address]  Blood culture (routine x 2)     Status: None   Collection Time: 06/26/19  5:33 PM   Specimen: BLOOD  Result Value Ref Range Status   Specimen Description   Final    BLOOD LEFT ANTECUBITAL Performed at [facility] [address] [place], [place] [address]    Special Requests   Final    BOTTLES DRAWN AEROBIC AND ANAEROBIC Blood Culture adequate volume Performed at [facility] [address] [place], [place] [address]    Culture   Final    NO GROWTH 5 DAYS Performed at [facility], [place] [address], [place], [place] [address]    Report Status 07/01/2019 FINAL  Final  Respiratory Panel by RT PCR (Flu A&B, Covid) - Nasopharyngeal Swab     Status: None   Collection Time: 06/26/19  6:07 PM   Specimen: Nasopharyngeal Swab  Result Value Ref Range Status   SARS Coronavirus 2 by RT PCR NEGATIVE NEGATIVE Final    Comment: (NOTE) SARS-CoV-2 target nucleic acids are NOT DETECTED. The SARS-CoV-2 RNA is generally detectable in upper respiratoy specimens during the acute phase of infection. The lowest concentration of SARS-CoV-2 viral copies this assay can detect is 131 copies/mL. A negative result does not preclude SARS-Cov-2 infection and should not be used as the sole basis for treatment or other patient management decisions. A negative result may occur with  improper specimen collection/handling, submission of specimen other than nasopharyngeal swab,  presence of viral mutation(s) within the areas targeted by this assay, and inadequate number of viral copies (<131 copies/mL). A negative result must be combined with clinical observations, patient history, and epidemiological information. The expected result is Negative. Fact Sheet for Patients:  PinkCheek.be Fact Sheet for Healthcare Providers:  GravelBags.it This test  is not yet ap proved or cleared by the [place] and  has been authorized for detection and/or diagnosis of SARS-CoV-2 by FDA under an Emergency Use Authorization (EUA). This EUA will remain  in effect (meaning this test can be used) for the duration of the COVID-19 declaration under Section 564(b)(1) of the Act, 21 U.S.C. section 360bbb-3(b)(1), unless the authorization is terminated or revoked sooner.    Influenza A by PCR NEGATIVE NEGATIVE Final   Influenza B by PCR NEGATIVE NEGATIVE Final    Comment: (NOTE) The Xpert Xpress SARS-CoV-2/FLU/RSV assay is intended as an aid in  the diagnosis of influenza from Nasopharyngeal swab specimens and  should not be used as a sole basis for treatment. Nasal washings and  aspirates are unacceptable for Xpert Xpress SARS-CoV-2/FLU/RSV  testing. Fact Sheet for Patients: PinkCheek.be Fact Sheet for Healthcare Providers: GravelBags.it This test is not yet approved or cleared by the [place] FDA and  has been authorized for detection and/or diagnosis of SARS-CoV-2 by  FDA under an Emergency Use Authorization (EUA). This EUA will remain  in effect (meaning this test can be used) for the duration of the  Covid-19 declaration under Section 564(b)(1) of the Act, 21  U.S.C. section 360bbb-3(b)(1), unless the authorization is  terminated or revoked. Performed at [facility], [place] [address], [place], [place] [address]   Blood culture  (routine x 2)     Status: None   Collection Time: 06/26/19  6:12 PM   Specimen: Site Not Specified; Blood  Result Value Ref Range Status   Specimen Description   Final    SITE NOT SPECIFIED Performed at [facility] [address] [place], [place] [address]    Special Requests   Final    BOTTLES DRAWN AEROBIC AND ANAEROBIC Blood Culture adequate volume Performed at [facility] [address], [place], [place] [address]    Culture   Final    NO GROWTH 5 DAYS Performed at [facility], [place] [address], [place], [place] [address]    Report Status 07/01/2019 FINAL  Final         Radiology Studies: DG Abd 1 View  Result Date: 06/30/2019 CLINICAL DATA:  NG tube repositioning EXAM: ABDOMEN - 1 VIEW COMPARISON:  06/30/2019 FINDINGS: The enteric tube remains looped within the gastric body with the tip projecting over the gastric fundus. The tip is pointed towards the GE junction. IMPRESSION: NG tube as above.  The stomach remains distended. Electronically Signed   By: Constance Holster M.D.   On: 06/30/2019 02:04   DG Abd 1 View  Result Date: 06/30/2019 CLINICAL DATA:  Repositioning of NG tube. EXAM: ABDOMEN - 1 VIEW COMPARISON:  06/30/2019 FINDINGS: The enteric tube tip projects over the gastric body/fundus. The tube is coiled within the stomach. Consider retracting the tube by approximately 10-15 cm. Again noted is a distended stomach. IMPRESSION: 1. NG tube as above. 2. Distended stomach. Electronically Signed   By: Constance Holster M.D.   On: 06/30/2019 01:20   DG Abd 1 View  Result Date: 06/30/2019 CLINICAL DATA:  NG tube placement, patient pulled out previous NG tube. EXAM: ABDOMEN - 1 VIEW COMPARISON:  Radiograph earlier this day, CT 06/26/2019 FINDINGS: Tip and side port of the enteric tube below the diaphragm, however appears looped in distended stomach with tip directed cranially gaseous gastric distension again seen. Gastric  pneumatosis on prior exam is not visualized IMPRESSION: Tip and  side port of the enteric tube below the diaphragm within distended stomach, however the tube appears looped with tip directed cranially. Consider retraction of approximately 10 cm. Electronically Signed   By: Keith Rake M.D.   On: 06/30/2019 00:26   DG Abd 1 View  Result Date: 06/29/2019 CLINICAL DATA:  NG tube placement EXAM: ABDOMEN - 1 VIEW COMPARISON:  None. FINDINGS: The stomach is distended. The enteric tube projects over the gastric body. The tip is pointed distally. There is no pneumatosis or free air. IMPRESSION: Enteric tube projects over the gastric body. The stomach is distended. Electronically Signed   By: Constance Holster M.D.   On: 06/29/2019 18:43   [place] EKG SITE RITE  Result Date: 07/01/2019 If [facility] image not attached, placement could not be confirmed due to current cardiac rhythm.       Scheduled Meds: . Chlorhexidine Gluconate Cloth  6 each Topical Once  . insulin aspart  0-6 Units Subcutaneous Q6H  . mouth rinse  15 mL Mouth Rinse BID  . polyvinyl alcohol  1 drop Both Eyes TID  . sodium chloride flush  3 mL Intravenous Once  . sodium chloride flush  3 mL Intravenous Q12H   Continuous Infusions: . cefoTEtan (CEFOTAN) IV    . dextrose 5 % and 0.45% NaCl 100 mL/hr at 07/01/19 0202  . potassium chloride       LOS: 5 days    Time spent: 25 minutes. Greater than 50% of this time was spent in direct contact with the patient, coordinating care and discussing relevant ongoing clinical issues.    Lelon Frohlich, MD [facility] Pager [phone]  If 7PM-7AM, please contact night-coverage [email or web address] Password [facility] 07/01/2019, 9:47 AM

## 2019-07-01 NOTE — Anesthesia Preprocedure Evaluation (Addendum)
Anesthesia Evaluation  Patient identified by MRN, date of birth, ID band Patient awake    Reviewed: Allergy & Precautions, Patient's Chart, lab work & pertinent test results  Airway Mallampati: II  TM Distance: >3 FB Neck ROM: Full    Dental no notable dental hx. (+) Upper Dentures, Lower Dentures   Pulmonary neg pulmonary ROS,    Pulmonary exam normal breath sounds clear to auscultation       Cardiovascular hypertension, Pt. on medications Normal cardiovascular exam Rhythm:Regular Rate:Normal  Cath 02/2019: Normal resting pulmonary pressures by direct right heart cath measurement. No significant increase in pulmonary pressures with low intensity isometric exercise.    Neuro/Psych TIAnegative psych ROS   GI/Hepatic Neg liver ROS, GERD  Medicated and Controlled,SBO- presented with confusion, gastric distension, pneumatosis    Endo/Other  diabetes, Type 2  Renal/GU Renal disease    History of carcinoid tumor s/p ex lap with bilateral salpingo-oophorectomy, omentectomy and radical debulking for suspected ovarian cancer 08/31/2017 Dr. Denman George    Musculoskeletal negative musculoskeletal ROS (+)   Abdominal Normal abdominal exam  (+)   Peds negative pediatric ROS (+)  Hematology  (+) Blood dyscrasia, anemia , plt 167, hct 34.6   Anesthesia Other Findings K 2.8  Reproductive/Obstetrics negative OB ROS                           Anesthesia Physical  Anesthesia Plan  ASA: III  Anesthesia Plan: General   Post-op Pain Management:    Induction: Intravenous  PONV Risk Score and Plan: 4 or greater and Ondansetron, Treatment may vary due to age or medical condition and Dexamethasone  Airway Management Planned: Oral ETT  Additional Equipment: None  Intra-op Plan:   Post-operative Plan: Possible Post-op intubation/ventilation  Informed Consent: I have reviewed the patients History and Physical,  chart, labs and discussed the procedure including the risks, benefits and alternatives for the proposed anesthesia with the patient or authorized representative who has indicated his/her understanding and acceptance.     Dental advisory given  Plan Discussed with: CRNA  Anesthesia Plan Comments: (K 2.8- advised bedside RN to start repletion and send remaining IV potassium supplementation along with patient to preop. Will send type and crossmatch x 2 units. 2nd PIV )      Anesthesia Quick Evaluation

## 2019-07-01 NOTE — Progress Notes (Signed)
Nutrition Follow-up  DOCUMENTATION CODES:   Not applicable  INTERVENTION:  TPN per pharmacy   NUTRITION DIAGNOSIS:   Inadequate oral intake related to inability to eat as evidenced by NPO status.  Addressing via TPN  GOAL:   Patient will meet greater than or equal to 90% of their needs  Progressing  MONITOR:   Diet advancement  REASON FOR ASSESSMENT:   Consult New TPN/TNA  ASSESSMENT:   84 year old female admitted from home on 2/17 due to N/V and decreased p.o. intake. In the ED she was found to have a SBO and gastric distention. She was seen by surgery in the ED and NG tube was placed. She has a history of a pelvic carcinoid tumor for which she underwent ex lap in 08/2017 with bilateral salpingo-oophorectomy and omentectomy as well as partial hysterectomy.  Patient has been NPO since admission. NGT placed 2/17 with 1550 ml out over the last 24 hrs. PICC/TPN ordered, RD consulted for new TNA. Patient currently off floor for procedure.  Per notes: - xray shows persistently distended stomach - pelvic carcinoid tumor s/p ex lap with bilateral salpingo-oophorectomy, omentectomy and radical debulking (2019) followed by Oncology - ex-lap possible small bowel resection today  Medications reviewed and include: SS novolg D5 with NaCl Lactated ringers KCl 10 mEq  Labs: CBGs 141-160 x 24 hrs, K 2.8 (L) 2/20 Mg 2.5 (H)  Diet Order:   Diet Order            Diet NPO time specified  Diet effective now              EDUCATION NEEDS:   No education needs have been identified at this time  Skin:  Skin Assessment: Reviewed RN Assessment  Last BM:  PTA  Height:   Ht Readings from Last 1 Encounters:  06/26/19 5\' 9"  (1.753 m)    Weight:   Wt Readings from Last 1 Encounters:  06/26/19 66.1 kg    Ideal Body Weight:  65.9 kg  BMI:  Body mass index is 21.52 kg/m.  Estimated Nutritional Needs:   Kcal:  1700-1900 kcal  Protein:  85-100 grams  Fluid:  >/= 2  L/day   Lajuan Lines, RD, LDN Clinical Nutrition Office [phone] After Hours/Weekend Pager # in [place]

## 2019-07-01 NOTE — Plan of Care (Signed)
  Problem: Education: Goal: Knowledge of General Education information will improve Description: Including pain rating scale, medication(s)/side effects and non-pharmacologic comfort measures Outcome: Progressing   Problem: Health Behavior/Discharge Planning: Goal: Ability to manage health-related needs will improve Outcome: Progressing   Problem: Clinical Measurements: Goal: Ability to maintain clinical measurements within normal limits will improve Outcome: Progressing Goal: Will remain free from infection Outcome: Progressing Goal: Diagnostic test results will improve Outcome: Progressing Goal: Respiratory complications will improve Outcome: Progressing Goal: Cardiovascular complication will be avoided Outcome: Progressing   Problem: Activity: Goal: Risk for activity intolerance will decrease Outcome: Progressing   Problem: Nutrition: Goal: Adequate nutrition will be maintained Description: Presently Pt. Is NPO. Outcome: Progressing  PT NPO but running TPN in double lumen PICC Problem: Coping: Goal: Level of anxiety will decrease Outcome: Progressing   Problem: Elimination: Goal: Will not experience complications related to bowel motility Outcome: Progressing Goal: Will not experience complications related to urinary retention Outcome: Progressing   Problem: Pain Managment: Goal: General experience of comfort will improve Outcome: Progressing   Problem: Safety: Goal: Ability to remain free from injury will improve Outcome: Progressing   Problem: Skin Integrity: Goal: Risk for impaired skin integrity will decrease Outcome: Progressing

## 2019-07-01 NOTE — Care Management Important Message (Signed)
Important Message  Patient Details IM Letter given to Dessa Phi RN Case Manager to present to the Patient Name: Gloria Murphy MRN: [id number] Date of Birth: 03/10/1936   Medicare Important Message Given:  Yes     Kerin Salen 07/01/2019, 12:11 PM

## 2019-07-01 NOTE — Progress Notes (Signed)
   07/01/19 1532  Vitals  Temp 97.7 F (36.5 C)  Temp Source Oral  BP (!) 187/91  MAP (mmHg) 120  BP Location Left Arm  BP Method Automatic  Patient Position (if appropriate) Lying  Pulse Rate 80  Resp 17  Oxygen Therapy  SpO2 100 %  O2 Device Nasal Cannula  O2 Flow Rate (L/min) 2.5 L/min  Dr. Jerilee Hoh notified of patient's BP and pulse.  Will continue to monitor.

## 2019-07-01 NOTE — Op Note (Signed)
07/01/2019  1:08 PM  PATIENT:  Gloria Murphy  84 y.o. female  PRE-OPERATIVE DIAGNOSIS:  small bowel obstruction  POST-OPERATIVE DIAGNOSIS:  small bowel obstruction  PROCEDURE:  Procedure(s): EXPLORATORY LAPAROTOMY (N/A)  LYSIS OF ADHESIONS  SURGEON:  Surgeon(s) and Role:    * Jovita Kussmaul, MD - Primary  PHYSICIAN ASSISTANT:   ASSISTANTS: none   ANESTHESIA:   general  EBL:  25 mL   BLOOD ADMINISTERED:none  DRAINS: none   LOCAL MEDICATIONS USED:  NONE  SPECIMEN:  No Specimen  DISPOSITION OF SPECIMEN:  N/A  COUNTS:  YES  TOURNIQUET:  * No tourniquets in log *  DICTATION: .Dragon Dictation   After informed consent was obtained the patient was brought to the operating room and placed in the supine position on the operating table.  After adequate induction of general anesthesia the patient's abdomen was prepped with ChloraPrep, allowed to dry, and draped in usual sterile manner.  An appropriate timeout was performed.  A midline incision was made with a 10 blade knife.  The incision was carried through the skin and subcutaneous tissue sharply with the electrocautery until the linea alba is identified.  The linea alba was also incised with the electrocautery.  The fascia of each eye was grasped with Coker clamps and elevated anteriorly.  The peritoneum was then opened sharply gaining access to the abdominal cavity.  The rest of the incision was opened under direct vision.  There was 2 small loops of small intestine that were adherent to the anterior abdominal wall and this was taken down sharply with the Metzenbaum scissors.  The small bowel was then examined.  The dilated small bowel was followed until I was able to identify an internal hernia.  It was formed by some adhesions.  These adhesions were broken up by blunt finger dissection in the obstruction was relieved.  Much of the small bowel was stuck down in the pelvis and most of this was also taken down sharply with the  Metzenbaum scissors.  Once I felt comfortable that the remaining adhesions were not causing any evidence of obstruction then the abdomen was inspected and no other abnormalities were noted.  The NG tube was confirmed in the stomach.  The abdomen was irrigated with copious amounts of saline.  Next the fascia of the anterior abdominal wall was closed with 2 running #1 double-stranded looped PDS sutures.  The subcutaneous tissue was irrigated with saline and the skin was closed with staples.  Sterile dressings were applied.  The patient tolerated the procedure well.  At the end of the case all needle sponge and instrument counts were correct.  The patient was then awakened and taken to recovery in stable condition.  PLAN OF CARE: Admit to inpatient   PATIENT DISPOSITION:  PACU - hemodynamically stable.   Delay start of Pharmacological VTE agent (>24hrs) due to surgical blood loss or risk of bleeding: no

## 2019-07-01 NOTE — Progress Notes (Addendum)
[facility] Progress Note     Subjective: CC-  Patient is tired and somewhat confused this morning. Sitter at bedside. Patient previously pulled NG tube out, currently it is in place and working.  Denies abdominal pain but is still tender on exam. No flatus or BM. NG tube with 1550cc out last 24 hours. Xray yesterday shows persistently distended stomach.  Objective: Vital signs in last 24 hours: Temp:  [98.4 F (36.9 C)-98.8 F (37.1 C)] 98.4 F (36.9 C) (02/22 0634) Pulse Rate:  [84-101] 84 (02/22 0634) Resp:  [16] 16 (02/21 1405) BP: (140-173)/(69-87) 144/69 (02/22 0634) SpO2:  [100 %] 100 % (02/22 0634)    Intake/Output from previous day: 02/21 0701 - 02/22 0700 In: 2068.1 [I.V.:2028.1; NG/GT:40] Out: 4250 [Urine:1200; Emesis/NG output:3050] Intake/Output this shift: No intake/output data recorded.  PE: Gen:  Drowsy, NAD Card:  RRR Pulm:  CTAB, no W/R/R, rate and effort normal Abd: Soft, nondistended, hypoactive BS, no HSM, mild diffuse tenderness without rebound or guarding/ no peritonitis Psych: Alert and oriented to self, knows that she is in the hospital Skin: no rashes noted, warm and dry  Lab Results:  Recent Labs    06/29/19 0546 06/30/19 0454  WBC 10.7* 8.0  HGB 12.5 10.8*  HCT 40.8 34.6*  PLT 188 167   BMET Recent Labs    06/30/19 0454 07/01/19 0438  NA 146* 141  K 2.9* 2.8*  CL 100 103  CO2 34* 28  GLUCOSE 104* 157*  BUN 28* 17  CREATININE 0.95 0.72  CALCIUM 8.9 8.7*   PT/INR No results for input(s): LABPROT, INR in the last 72 hours. CMP     Component Value Date/Time   NA 141 07/01/2019 0438   NA 142 02/26/2019 1618   K 2.8 (L) 07/01/2019 0438   CL 103 07/01/2019 0438   CO2 28 07/01/2019 0438   GLUCOSE 157 (H) 07/01/2019 0438   BUN 17 07/01/2019 0438   BUN 19 02/26/2019 1618   CREATININE 0.72 07/01/2019 0438   CALCIUM 8.7 (L) 07/01/2019 0438   PROT 7.8 06/27/2019 0316   PROT 7.7 02/04/2019 1719   ALBUMIN 3.0 (L)  06/27/2019 0316   ALBUMIN 4.1 02/04/2019 1719   AST 25 06/27/2019 0316   ALT 19 06/27/2019 0316   ALKPHOS 71 06/27/2019 0316   BILITOT 2.3 (H) 06/27/2019 0316   BILITOT 0.4 02/04/2019 1719   GFRNONAA >60 07/01/2019 0438   GFRAA >60 07/01/2019 0438   Lipase     Component Value Date/Time   LIPASE 38 06/26/2019 1352       Studies/Results: DG Abd 1 View  Result Date: 06/30/2019 CLINICAL DATA:  NG tube repositioning EXAM: ABDOMEN - 1 VIEW COMPARISON:  06/30/2019 FINDINGS: The enteric tube remains looped within the gastric body with the tip projecting over the gastric fundus. The tip is pointed towards the GE junction. IMPRESSION: NG tube as above.  The stomach remains distended. Electronically Signed   By: Constance Holster M.D.   On: 06/30/2019 02:04   DG Abd 1 View  Result Date: 06/30/2019 CLINICAL DATA:  Repositioning of NG tube. EXAM: ABDOMEN - 1 VIEW COMPARISON:  06/30/2019 FINDINGS: The enteric tube tip projects over the gastric body/fundus. The tube is coiled within the stomach. Consider retracting the tube by approximately 10-15 cm. Again noted is a distended stomach. IMPRESSION: 1. NG tube as above. 2. Distended stomach. Electronically Signed   By: Constance Holster M.D.   On: 06/30/2019 01:20   DG Abd  1 View  Result Date: 06/30/2019 CLINICAL DATA:  NG tube placement, patient pulled out previous NG tube. EXAM: ABDOMEN - 1 VIEW COMPARISON:  Radiograph earlier this day, CT 06/26/2019 FINDINGS: Tip and side port of the enteric tube below the diaphragm, however appears looped in distended stomach with tip directed cranially gaseous gastric distension again seen. Gastric pneumatosis on prior exam is not visualized IMPRESSION: Tip and side port of the enteric tube below the diaphragm within distended stomach, however the tube appears looped with tip directed cranially. Consider retraction of approximately 10 cm. Electronically Signed   By: Keith Rake M.D.   On: 06/30/2019 00:26    DG Abd 1 View  Result Date: 06/29/2019 CLINICAL DATA:  NG tube placement EXAM: ABDOMEN - 1 VIEW COMPARISON:  None. FINDINGS: The stomach is distended. The enteric tube projects over the gastric body. The tip is pointed distally. There is no pneumatosis or free air. IMPRESSION: Enteric tube projects over the gastric body. The stomach is distended. Electronically Signed   By: Constance Holster M.D.   On: 06/29/2019 18:43   DG Abd 1 View  Result Date: 06/29/2019 CLINICAL DATA:  NG tube placement. EXAM: ABDOMEN - 1 VIEW COMPARISON:  06/29/2019 at 4:24 a.m. FINDINGS: Interval placement of nasogastric tube with tip over the stomach in the left mid abdomen. Bowel gas pattern is nonobstructive. No free peritoneal air. Multiple surgical clips over the left lower quadrant. Remainder of the exam is unchanged. IMPRESSION: Nasogastric tube with tip over the stomach in the left mid abdomen. Nonobstructive bowel gas pattern. Electronically Signed   By: Marin Olp M.D.   On: 06/29/2019 11:04    Anti-infectives: Anti-infectives (From admission, onward)   Start     Dose/Rate Route Frequency Ordered Stop   06/27/19 1000  piperacillin-tazobactam (ZOSYN) IVPB 3.375 g  Status:  Discontinued     3.375 g 12.5 mL/hr over 240 Minutes Intravenous Every 8 hours 06/27/19 0807 06/29/19 1119   06/27/19 0200  piperacillin-tazobactam (ZOSYN) IVPB 2.25 g  Status:  Discontinued     2.25 g 100 mL/hr over 30 Minutes Intravenous Every 8 hours 06/26/19 2012 06/27/19 0807   06/26/19 1700  piperacillin-tazobactam (ZOSYN) IVPB 3.375 g     3.375 g 100 mL/hr over 30 Minutes Intravenous  Once 06/26/19 1651 06/26/19 1823       Assessment/Plan DM Lactic acidosis - resolved AKI - improved, Cr 0.72 Hypokalemia   SBO with gastric distention and pneumatosis -h/o Carcinoid tumor s/p ex lapwith bilateral salpingo-oophorectomy, omentectomy and radical debulkingfor suspected ovarian cancer 08/31/2017 Dr. Denman George - CT scan 2/17  shows SBO likely related to adhesions; marked gastric distension, small bowel distension, associated with gastric pneumatosis and portal venous gas; calcified mesenteric lesion and right lower quadrant carcinoid  Will need continued NG given high output to avoid aspiration May require a laparotomy this week as well as picc line and TNA  ID -zosyn 2/17>>2/20 FEN -IVF, NPO/NGT to LIWS VTE -SCDs, ok for chemical DVT prophylaxis from surgical standpoint Foley -none Follow up -TBD  Plan - Obstruction does not seem to be resolving. Discussed with MD, will plan for exploratory laparotomy possible small bowel resection today. I called and discussed surgery with the patient's husband. We discussed benefits and risks including general anesthesia, dvt/blood clot, bleeding, bowel injury, and death. He has agreed to proceed. Continue NPO/NGT to LIWS. Patient has also had nothing to eat/drink >1 week, will order PICC/TPN.   LOS: 5 days    Jerene Pitch A  [place], [facility] 07/01/2019, 8:02 AM Please see Amion for pager number during day hours 7:00am-4:30pm

## 2019-07-01 NOTE — Progress Notes (Signed)
PHARMACY - TOTAL PARENTERAL NUTRITION CONSULT NOTE   Indication: bowel obstruction  Patient Measurements: Height: 5\' 9"  (175.3 cm) Weight: 145 lb 11.6 oz (66.1 kg) IBW/kg (Calculated) : 66.2 TPN AdjBW (KG): 66.1 Body mass index is 21.52 kg/m. Usual Weight:   Assessment: 84 yo woman with hx of pelvic carcinoid tumor, admitted with N/V, decreased po intake was found to have SBO and gastric distention.  Glucose / Insulin: CBG 160, 1 unit /24 hours Electrolytes: K 2.8 Renal: WNL LFTs / TGs: WNL (2/17) Prealbumin / albumin: 16.9 (2/19) Intake / Output; MIVF: D5 1/2 NS @ 100 ml/hr GI Imaging: 2/21 X-ray show persistently distended stomach Surgeries / Procedures: possible exploratory laparotomy this week  Central access: PICC 2/22 TPN start date: 2/22  Nutritional Goals RD recs pending kCal: , Protein: , Fluid:  Goal TPN rate is 75 mL/hr (provides 81 g of protein and 1908 kcals per day)--will recalculate once RD has assessed pt  Current Nutrition:  npo  Plan:  K 1meq IV x6 per MD Start TPN at 90mL/hr at 1800 TPN will contain 20g/L of lipid emulsion  Electrolytes in TPN: 15mEq/L of Na, 29mEq/L of K, 31mEq/L of Ca, 74mEq/L of Mg, and 66mmol/L of Phos. Cl:Ac ratio 1:1 Add  trace elements to TPN daily and standard MVI on MWF only due to national backorder Pt on sensitive SSI q6h,  adjust as needed  Reduce MIVF to 65 mL/hr at 1800 Monitor TPN labs tomorrow and on Mon/Thurs  Dolly Rias RPh 07/01/2019, 10:18 AM

## 2019-07-01 NOTE — Progress Notes (Signed)
Peripherally Inserted Central Catheter/Midline Placement  The IV Nurse has discussed with the patient and/or persons authorized to consent for the patient, the purpose of this procedure and the potential benefits and risks involved with this procedure.  The benefits include less needle sticks, lab draws from the catheter, and the patient may be discharged home with the catheter. Risks include, but not limited to, infection, bleeding, blood clot (thrombus formation), and puncture of an artery; nerve damage and irregular heartbeat and possibility to perform a PICC exchange if needed/ordered by physician.  Alternatives to this procedure were also discussed.  Bard Power PICC patient education guide, fact sheet on infection prevention and patient information card has been provided to patient /or left at bedside.    PICC/Midline Placement Documentation  PICC Double Lumen [id number] PICC Right Basilic 39 cm 0 cm (Active)  Indication for Insertion or Continuance of Line Administration of hyperosmolar/irritating solutions (i.e. TPN, Vancomycin, etc.) 07/01/19 0952  Exposed Catheter (cm) 0 cm 07/01/19 [id number]  Site Assessment Clean;Intact;Dry 07/01/19 0952  Lumen #1 Status Flushed;Blood return noted 07/01/19 0952  Lumen #2 Status Flushed;Blood return noted 07/01/19 [id number]  Dressing Type Transparent 07/01/19 0952  Dressing Status Clean;Dry;Antimicrobial disc in place;Intact;Other (Comment) 07/01/19 0952  Dressing Intervention New dressing 07/01/19 [id number]  Dressing Change Due 07/08/19 07/01/19 0952       Gloria Murphy 07/01/2019, 9:53 AM

## 2019-07-02 ENCOUNTER — Inpatient Hospital Stay: Payer: Self-pay

## 2019-07-02 LAB — DIFFERENTIAL
Abs Immature Granulocytes: 0.22 10*3/uL — ABNORMAL HIGH (ref 0.00–0.07)
Basophils Absolute: 0 10*3/uL (ref 0.0–0.1)
Basophils Relative: 0 %
Eosinophils Absolute: 0 10*3/uL (ref 0.0–0.5)
Eosinophils Relative: 0 %
Immature Granulocytes: 3 %
Lymphocytes Relative: 10 %
Lymphs Abs: 0.7 10*3/uL (ref 0.7–4.0)
Monocytes Absolute: 0.6 10*3/uL (ref 0.1–1.0)
Monocytes Relative: 8 %
Neutro Abs: 5.4 10*3/uL (ref 1.7–7.7)
Neutrophils Relative %: 79 %

## 2019-07-02 LAB — CBC
HCT: 33.8 % — ABNORMAL LOW (ref 36.0–46.0)
Hemoglobin: 10.6 g/dL — ABNORMAL LOW (ref 12.0–15.0)
MCH: 25.8 pg — ABNORMAL LOW (ref 26.0–34.0)
MCHC: 31.4 g/dL (ref 30.0–36.0)
MCV: 82.2 fL (ref 80.0–100.0)
Platelets: 158 10*3/uL (ref 150–400)
RBC: 4.11 MIL/uL (ref 3.87–5.11)
RDW: 15.9 % — ABNORMAL HIGH (ref 11.5–15.5)
WBC: 6.9 10*3/uL (ref 4.0–10.5)
nRBC: 0 % (ref 0.0–0.2)

## 2019-07-02 LAB — COMPREHENSIVE METABOLIC PANEL
ALT: 31 U/L (ref 0–44)
AST: 38 U/L (ref 15–41)
Albumin: 2.5 g/dL — ABNORMAL LOW (ref 3.5–5.0)
Alkaline Phosphatase: 64 U/L (ref 38–126)
Anion gap: 8 (ref 5–15)
BUN: 16 mg/dL (ref 8–23)
CO2: 25 mmol/L (ref 22–32)
Calcium: 8.6 mg/dL — ABNORMAL LOW (ref 8.9–10.3)
Chloride: 105 mmol/L (ref 98–111)
Creatinine, Ser: 0.71 mg/dL (ref 0.44–1.00)
GFR calc Af Amer: >60 mL/min (ref >60)
GFR calc non Af Amer: >60 mL/min (ref >60)
Glucose, Bld: 292 mg/dL — ABNORMAL HIGH (ref 70–99)
Potassium: 3.7 mmol/L (ref 3.5–5.1)
Sodium: 138 mmol/L (ref 135–145)
Total Bilirubin: 1.3 mg/dL — ABNORMAL HIGH (ref 0.3–1.2)
Total Protein: 6.2 g/dL — ABNORMAL LOW (ref 6.5–8.1)

## 2019-07-02 LAB — GLUCOSE, CAPILLARY
Glucose-Capillary: 161 mg/dL — ABNORMAL HIGH (ref 70–99)
Glucose-Capillary: 263 mg/dL — ABNORMAL HIGH (ref 70–99)
Glucose-Capillary: 250 mg/dL — ABNORMAL HIGH (ref 70–99)
Glucose-Capillary: 293 mg/dL — ABNORMAL HIGH (ref 70–99)
Glucose-Capillary: 171 mg/dL — ABNORMAL HIGH (ref 70–99)
Glucose-Capillary: 211 mg/dL — ABNORMAL HIGH (ref 70–99)

## 2019-07-02 LAB — TRIGLYCERIDES: Triglycerides: 108 mg/dL (ref <150)

## 2019-07-02 LAB — MAGNESIUM: Magnesium: 1.7 mg/dL (ref 1.7–2.4)

## 2019-07-02 LAB — PREALBUMIN: Prealbumin: 12.1 mg/dL — ABNORMAL LOW (ref 18–38)

## 2019-07-02 LAB — PHOSPHORUS: Phosphorus: 1.8 mg/dL — ABNORMAL LOW (ref 2.5–4.6)

## 2019-07-02 MED ORDER — MORPHINE SULFATE (PF) 2 MG/ML IV SOLN
1.0000 mg | INTRAVENOUS | Status: DC | PRN
  Administered 2019-07-02: 2 mg via INTRAVENOUS
  Filled 2019-07-02: qty 1

## 2019-07-02 MED ORDER — MAGNESIUM SULFATE 2 GM/50ML IV SOLN
2.0000 g | Freq: Once | INTRAVENOUS | Status: AC
  Administered 2019-07-02: 13:00:00 2 g via INTRAVENOUS
  Filled 2019-07-02: qty 50

## 2019-07-02 MED ORDER — INSULIN ASPART 100 UNIT/ML ~~LOC~~ SOLN: 0.0000 [IU] | Freq: Four times a day (QID) | SUBCUTANEOUS | Status: DC

## 2019-07-02 MED ORDER — POTASSIUM PHOSPHATES 15 MMOLE/5ML IV SOLN
20.0000 mmol | Freq: Once | INTRAVENOUS | Status: AC
  Administered 2019-07-02: 20 mmol via INTRAVENOUS
  Filled 2019-07-02: qty 6.67

## 2019-07-02 MED ORDER — INSULIN ASPART 100 UNIT/ML ~~LOC~~ SOLN
0.0000 [IU] | SUBCUTANEOUS | Status: DC
  Administered 2019-07-02: 18:00:00 3 [IU] via SUBCUTANEOUS
  Administered 2019-07-02: 8 [IU] via SUBCUTANEOUS
  Administered 2019-07-02: 21:00:00 5 [IU] via SUBCUTANEOUS

## 2019-07-02 MED ORDER — TRAVASOL 10 % IV SOLN
INTRAVENOUS | Status: DC
  Filled 2019-07-02: qty 420

## 2019-07-02 MED ORDER — METOPROLOL TARTRATE 5 MG/5ML IV SOLN: 5.0000 mg | Freq: Four times a day (QID) | INTRAVENOUS | Status: DC | PRN

## 2019-07-02 MED ORDER — ACETAMINOPHEN 10 MG/ML IV SOLN
1000.0000 mg | Freq: Four times a day (QID) | INTRAVENOUS | Status: AC
  Administered 2019-07-02 – 2019-07-03 (×3): 1000 mg via INTRAVENOUS
  Filled 2019-07-02 (×5): qty 100

## 2019-07-02 NOTE — Progress Notes (Signed)
Removed pt's 44fr foley without complication. VSS. Will cont to mx urine output.

## 2019-07-02 NOTE — Evaluation (Signed)
Physical Therapy Evaluation Patient Details Name: Gloria Murphy MRN: [id number] DOB: Dec 12, 1935 Today's Date: 07/02/2019   History of Present Illness  84 year old woman admitted from home on 2/17 due to nausea and vomiting with decreased p.o. intake. She has a history of a pelvic carcinoid tumor for which she underwent exploratory laparotomy in April 2019 with bilateral salpingo-oophorectomy and omentectomy as well as partial hysterectomy  In the ED she was found to have a large small bowel obstruction and gastric distention.  NG tube was placed, exploratory laparotomy 2/22.   Clinical Impression  Pt admitted with above diagnosis.  Pt currently with functional limitations due to the deficits listed below (see PT Problem List). Pt will benefit from skilled PT to increase their independence and safety with mobility to allow discharge to the venue listed below.   Pt requiring mod assist for bed mobility and transfers at this time.  Pt mostly limited due to pain at surgical site.  Pt previously independent from home, so anticipate pt will progress to d/c home however may need initial assist upon d/c.    Follow Up Recommendations Supervision/Assistance - 24 hour;Home health PT    Equipment Recommendations  Rolling walker with 5" wheels    Recommendations for Other Services       Precautions / Restrictions Precautions Precautions: Fall Precaution Comments: NG tube      Mobility  Bed Mobility Overal bed mobility: Needs Assistance Bed Mobility: Rolling;Sidelying to Sit Rolling: Min assist Sidelying to sit: Mod assist;+2 for physical assistance       General bed mobility comments: cues for reaching for bed rail to self assist; pt states 'don't pull' at beginning so allowed pt to initiate and request assist as needed; assist mostly for trunk due to pain  Transfers Overall transfer level: Needs assistance Equipment used: 2 person hand held assist Transfers: Sit to/from Stand;Stand Pivot  Transfers Sit to Stand: Mod assist Stand pivot transfers: Min assist       General transfer comment: assist to rise and steady then took a few small steps over to recliner; requested HHA instead of RW today  Ambulation/Gait             General Gait Details: declined due to pain however agreeable to OOB to recliner at least  Stairs            Wheelchair Mobility    Modified Rankin (Stroke Patients Only)       Balance                                             Pertinent Vitals/Pain Pain Assessment: 0-10 Pain Score: 8  Pain Location: incision site at abdomen Pain Descriptors / Indicators: Discomfort;Grimacing;Sore;Tender Pain Intervention(s): Repositioned;Premedicated before session;Monitored during session    Home Living Family/patient expects to be discharged to:: Private residence Living Arrangements: Spouse/significant other Available Help at Discharge: Family;Available 24 hours/day;Available PRN/intermittently(husband 24/7 daughter PRN) Type of Home: House Home Access: Level entry     Home Layout: One level Home Equipment: None      Prior Function Level of Independence: Independent         Comments: does not drive anymore - but still completing all ADL/IADL     Hand Dominance        Extremity/Trunk Assessment        Lower Extremity Assessment Lower Extremity Assessment: Generalized weakness  Communication   Communication: No difficulties  Cognition Arousal/Alertness: Awake/alert(initially very sleepy) Behavior During Therapy: Flat affect Overall Cognitive Status: No family/caregiver present to determine baseline cognitive functioning                                 General Comments: pt very quiet and reports pain limiting however states she knows of importance of mobility; mostly grimacing during session however was assisting      General Comments      Exercises     Assessment/Plan     PT Assessment Patient needs continued PT services  PT Problem List Decreased strength;Decreased mobility;Decreased activity tolerance;Decreased balance;Decreased knowledge of use of DME;Pain       PT Treatment Interventions DME instruction;Gait training;Functional mobility training;Therapeutic activities;Patient/family education;Balance training;Therapeutic exercise    PT Goals (Current goals can be found in the Care Plan section)  Acute Rehab PT Goals PT Goal Formulation: With patient Time For Goal Achievement: 07/16/19 Potential to Achieve Goals: Fair    Frequency Min 3X/week   Barriers to discharge        Co-evaluation               AM-PAC PT "6 Clicks" Mobility  Outcome Measure Help needed turning from your back to your side while in a flat bed without using bedrails?: A Lot Help needed moving from lying on your back to sitting on the side of a flat bed without using bedrails?: A Lot Help needed moving to and from a bed to a chair (including a wheelchair)?: A Lot Help needed standing up from a chair using your arms (e.g., wheelchair or bedside chair)?: A Lot Help needed to walk in hospital room?: A Lot Help needed climbing 3-5 steps with a railing? : Total 6 Click Score: 11    End of Session   Activity Tolerance: Patient limited by pain Patient left: in chair;with call bell/phone within reach;with chair alarm set;with nursing/sitter in room Nurse Communication: Mobility status PT Visit Diagnosis: Other abnormalities of gait and mobility (R26.89)    Time: [id number] PT Time Calculation (min) (ACUTE ONLY): 14 min   Charges:   PT Evaluation $PT Eval Low Complexity: 1 Low        Kati PT, DPT Acute Rehabilitation Services Office: [phone]  Trena Platt 07/02/2019, 3:01 PM

## 2019-07-02 NOTE — Progress Notes (Signed)
NG tube discontinued as ordered. No complications noted. Meal tray ordered. Will cont to mx.

## 2019-07-02 NOTE — Progress Notes (Signed)
PT Cancellation Note  Patient Details Name: Gloria Murphy MRN: [id number] DOB: 08-26-1935   Cancelled Treatment:    Reason Eval/Treat Not Completed: Fatigue/lethargy limiting ability to participate Pt just finished working with OT and fatigued, back in bed.  Will check back as schedule permits.   Denene Alamillo,KATHrine E 07/02/2019, 10:42 AM Arlyce Dice, DPT Acute Rehabilitation Services Office: [phone]

## 2019-07-02 NOTE — Progress Notes (Signed)
Patient confused,  pulled out PICC line. Paged Baltazar Najjar

## 2019-07-02 NOTE — Evaluation (Signed)
Occupational Therapy Evaluation Patient Details Name: Gloria Murphy MRN: [id number] DOB: [birth year] Today's Date: 07/02/2019    History of Present Illness 84 year old woman admitted from home on 2/17 due to nausea and vomiting with decreased p.o. intake. She has a history of a pelvic carcinoid tumor for which she underwent exploratory laparotomy in April 2019 with bilateral salpingo-oophorectomy and omentectomy as well as partial hysterectomy  In the ED she was found to have a large small bowel obstruction and gastric distention.  NG tube was placed, exploratory laparotomy 2/22.    Clinical Impression   Pt was independent prior to admission. She was completing her own ADL and ambulating without DME. Today she is mod A +2 for bed mobility, able to stand EOB with mod A for rise - but unable to progress to chair or BSC limited by pain. She is max to total A for LB ADL due to pain in abdomen, min guard for UB ADL as Pt still has NG tube. NT/sitter assisting throughout session for +2. Pt is motivated and wants to return home opposed to SNF - but she verbalizes the need to be able to move better prior to being able to do that. Educated on importance of moving small amounts frequently, sitting up for healing and recovery. At this time if pain can decrease she will be able to progress home with [facility], however we will monitor closely for need for post acute SNF level rehab. Next session focus on OOB as well as AE for LB ADL.     Follow Up Recommendations  Home health OT;Supervision/Assistance - 24 hour(anticipate improvement with mobility)    Equipment Recommendations  3 in 1 bedside commode;Other (comment)(AE for LB ADL)    Recommendations for Other Services PT consult     Precautions / Restrictions Restrictions Weight Bearing Restrictions: No      Mobility Bed Mobility Overal bed mobility: Needs Assistance Bed Mobility: Rolling;Sidelying to Sit;Sit to Sidelying Rolling: Min assist Sidelying  to sit: Mod assist;+2 for safety/equipment;+2 for physical assistance     Sit to sidelying: Mod assist;+2 for physical assistance;+2 for safety/equipment General bed mobility comments: dramatic pain increase when going from supine to sitting. educated in log roll for comfort/pain management. mod A for all aspects of trunk  Transfers Overall transfer level: Needs assistance Equipment used: 1 person hand held assist Transfers: Sit to/from Stand Sit to Stand: Mod assist         General transfer comment: mod A for steady and boost into standing, Pt with posterior lean (in response to pain?) and unable to take steps up the bed at this time.     Balance Overall balance assessment: Needs assistance Sitting-balance support: Bilateral upper extremity supported;Feet supported Sitting balance-Leahy Scale: Poor Sitting balance - Comments: brief moments of min guard but overall min A Postural control: Posterior lean Standing balance support: Bilateral upper extremity supported Standing balance-Leahy Scale: Poor Standing balance comment: "I feel like my feet are sliding out on me"                           ADL either performed or assessed with clinical judgement   ADL Overall ADL's : Needs assistance/impaired Eating/Feeding: NPO   Grooming: Min guard;Sitting;Wash/dry face Grooming Details (indicate cue type and reason): min guard for seated balance and for  Upper Body Bathing: Moderate assistance   Lower Body Bathing: Maximal assistance   Upper Body Dressing : Moderate assistance   Lower  Body Dressing: Maximal assistance   Toilet Transfer: Moderate assistance;Stand-pivot;BSC Toilet Transfer Details (indicate cue type and reason): sit<>stand EOB, Pt with posterior lean (response to pain?) Toileting- Clothing Manipulation and Hygiene: Maximal assistance       Functional mobility during ADLs: Moderate assistance;+2 for safety/equipment General ADL Comments: limited access  to LB for ADL due to abdominal pain, big change in mobility and ability to transfer     Vision Baseline Vision/History: Wears glasses Wears Glasses: At all times Patient Visual Report: No change from baseline       Perception     Praxis      Pertinent Vitals/Pain Pain Assessment: 0-10 Pain Score: 9  Pain Location: incision site at abdomen Pain Descriptors / Indicators: Discomfort;Grimacing;Sore;Tender Pain Intervention(s): Limited activity within patient's tolerance;Monitored during session;Repositioned     Hand Dominance     Extremity/Trunk Assessment Upper Extremity Assessment Upper Extremity Assessment: Generalized weakness   Lower Extremity Assessment Lower Extremity Assessment: Defer to PT evaluation   Cervical / Trunk Assessment Cervical / Trunk Assessment: Other exceptions Cervical / Trunk Exceptions: abdominal sx, sore   Communication Communication Communication: No difficulties   Cognition Arousal/Alertness: Awake/alert(initially very sleepy) Behavior During Therapy: Flat affect Overall Cognitive Status: No family/caregiver present to determine baseline cognitive functioning Area of Impairment: Following commands;Awareness                       Following Commands: Follows one step commands with increased time   Awareness: Emergent   General Comments: initially lethargic, cognition improved as session went on   General Comments       Exercises     Shoulder Instructions      Home Living Family/patient expects to be discharged to:: Private residence Living Arrangements: Spouse/significant other Available Help at Discharge: Family;Available 24 hours/day;Available PRN/intermittently(husband 24/7 daughter PRN) Type of Home: House Home Access: Level entry     Home Layout: One level     Bathroom Shower/Tub: Walk-in shower;Door   [employer] Toilet: Standard     Home Equipment: None          Prior Functioning/Environment Level of  Independence: Independent        Comments: does not drive anymore - but still completing all ADL/IADL        OT Problem List: Decreased range of motion;Decreased activity tolerance;Impaired balance (sitting and/or standing);Decreased safety awareness;Decreased cognition;Decreased knowledge of use of DME or AE;Pain      OT Treatment/Interventions: Self-care/ADL training;Energy conservation;DME and/or AE instruction;Therapeutic activities;Patient/family education;Balance training    OT Goals(Current goals can be found in the care plan section) Acute Rehab OT Goals Patient Stated Goal: to get back home OT Goal Formulation: With patient Time For Goal Achievement: 07/16/19 Potential to Achieve Goals: Good ADL Goals Pt Will Perform Grooming: with supervision;standing Pt Will Perform Upper Body Dressing: with set-up;sitting Pt Will Perform Lower Body Dressing: with min guard assist;with adaptive equipment;sit to/from stand Pt Will Transfer to Toilet: with min guard assist;ambulating Pt Will Perform Toileting - Clothing Manipulation and hygiene: with min guard assist;sitting/lateral leans Additional ADL Goal #1: Pt will perform bed mobility at min guard level prior to engaging in ADL  OT Frequency: Min 3X/week   Barriers to D/C:    husband is home all the time, daughter can assist PRN       Co-evaluation              AM-PAC OT "6 Clicks" Daily Activity     Outcome Measure Help  from another person eating meals?: Total(NPO) Help from another person taking care of personal grooming?: A Little Help from another person toileting, which includes using toliet, bedpan, or urinal?: A Lot Help from another person bathing (including washing, rinsing, drying)?: A Lot Help from another person to put on and taking off regular upper body clothing?: A Little Help from another person to put on and taking off regular lower body clothing?: A Lot 6 Click Score: 13   End of Session Equipment  Utilized During Treatment: Other (comment)(NG Tube remained in place) Nurse Communication: Mobility status;Other (comment)(concern about 2nd dose of COVID vaccine)  Activity Tolerance: Patient limited by pain Patient left: in bed;with call bell/phone within reach;with bed alarm set;with family/visitor present(right wrist restraint left off)  OT Visit Diagnosis: Unsteadiness on feet (R26.81);Other abnormalities of gait and mobility (R26.89);Muscle weakness (generalized) (M62.81);Pain Pain - part of body: (abdomen)                Time: [id number] OT Time Calculation (min): 28 min Charges:  OT General Charges $OT Visit: 1 Visit OT Evaluation $OT Eval Moderate Complexity: 1 Mod OT Treatments $Self Care/Home Management : 8-22 mins  Jesse Sans OTR/L Acute Rehabilitation Services Pager: [phone] Office: [place] 07/02/2019, 11:58 AM

## 2019-07-02 NOTE — Progress Notes (Signed)
[facility] Progress Note  1 Day Post-Op  Subjective: CC-  Tired this morning, did not sleep well last night. Abdomen sore. Denies bloating, nausea, vomiting. NG tube with low output. No flatus or BM.  Objective: Vital signs in last 24 hours: Temp:  [97.2 F (36.2 C)-98.6 F (37 C)] 98.6 F (37 C) (02/23 0609) Pulse Rate:  [70-100] 70 (02/23 0720) Resp:  [16-35] 16 (02/23 0609) BP: (170-193)/(85-97) 174/86 (02/23 0720) SpO2:  [99 %-100 %] 100 % (02/23 0609) Weight:  [77 kg] 77 kg (02/23 0534)    Intake/Output from previous day: 02/22 0701 - 02/23 0700 In: 3834.3 [I.V.:2976.7; IV Piggyback:857.6] Out: 1080 [Urine:1055; Blood:25] Intake/Output this shift: No intake/output data recorded.  PE: Gen:  Alert, NAD Card:  RRR Pulm:  CTAB, no W/R/R, rate and effort normal Abd: Soft, mild distension, few BS heard, mild diffuse tenderness, midline incision cdi with staples intact and honeycomb dressing in place/ trace dried blood on dressing  Lab Results:  Recent Labs    06/30/19 0454 06/30/19 0454 07/01/19 1146 07/02/19 0410  WBC 8.0  --   --  6.9  HGB 10.8*   < > 11.6* 10.6*  HCT 34.6*   < > 34.0* 33.8*  PLT 167  --   --  158   < > = values in this interval not displayed.   BMET Recent Labs    07/01/19 0438 07/01/19 0438 07/01/19 1146 07/02/19 0410  NA 141   < > 140 138  K 2.8*   < > 3.1* 3.7  CL 103   < > 101 105  CO2 28  --   --  25  GLUCOSE 157*   < > 174* 292*  BUN 17   < > 12 16  CREATININE 0.72   < > 0.70 0.71  CALCIUM 8.7*  --   --  8.6*   < > = values in this interval not displayed.   PT/INR No results for input(s): LABPROT, INR in the last 72 hours. CMP     Component Value Date/Time   NA 138 07/02/2019 0410   NA 142 02/26/2019 1618   K 3.7 07/02/2019 0410   CL 105 07/02/2019 0410   CO2 25 07/02/2019 0410   GLUCOSE 292 (H) 07/02/2019 0410   BUN 16 07/02/2019 0410   BUN 19 02/26/2019 1618   CREATININE 0.71 07/02/2019 0410   CALCIUM 8.6 (L) 07/02/2019 0410   PROT 6.2 (L) 07/02/2019 0410   PROT 7.7 02/04/2019 1719   ALBUMIN 2.5 (L) 07/02/2019 0410   ALBUMIN 4.1 02/04/2019 1719   AST 38 07/02/2019 0410   ALT 31 07/02/2019 0410   ALKPHOS 64 07/02/2019 0410   BILITOT 1.3 (H) 07/02/2019 0410   BILITOT 0.4 02/04/2019 1719   GFRNONAA >60 07/02/2019 0410   GFRAA >60 07/02/2019 0410   Lipase     Component Value Date/Time   LIPASE 38 06/26/2019 1352       Studies/Results: [place] EKG SITE RITE  Result Date: 07/01/2019 If Site Rite image not attached, placement could not be confirmed due to current cardiac rhythm.   Anti-infectives: Anti-infectives (From admission, onward)   Start     Dose/Rate Route Frequency Ordered Stop   07/01/19 1015  cefoTEtan (CEFOTAN) 2 g in sodium chloride 0.9 % 100 mL IVPB     2 g 200 mL/hr over 30 Minutes Intravenous On call to O.R. 07/01/19 0847 07/01/19 1234   06/27/19 1000  piperacillin-tazobactam (ZOSYN) IVPB 3.375 g  Status:  Discontinued     3.375 g 12.5 mL/hr over 240 Minutes Intravenous Every 8 hours 06/27/19 0807 06/29/19 1119   06/27/19 0200  piperacillin-tazobactam (ZOSYN) IVPB 2.25 g  Status:  Discontinued     2.25 g 100 mL/hr over 30 Minutes Intravenous Every 8 hours 06/26/19 2012 06/27/19 0807   06/26/19 1700  piperacillin-tazobactam (ZOSYN) IVPB 3.375 g     3.375 g 100 mL/hr over 30 Minutes Intravenous  Once 06/26/19 1651 06/26/19 1823       Assessment/Plan DM Lactic acidosis - resolved AKI - improved, Cr0.71 Hypokalemia Protein calorie Malnutrition - prealbumin 12.1 (2/23) H/o Carcinoid tumor s/p ex lapwith bilateral salpingo-oophorectomy, omentectomy and radical debulkingfor suspected ovarian cancer 08/31/2017 Dr. Denman George  SBO  S/p EXPLORATORY LAPAROTOMY (N/A) LYSIS OF ADHESIONS 2/22 Dr. Marlou Starks - POD#1 -Continue NPO/NGT and await return in bowel function. TPN for nutritional support while NPO - Mobilize, continue PT/OT - Scheduled IV tylenol and IV  robaxin for pain control, narcotics PRN  ID -zosyn 2/17>>2/20, cefotetan periop x1 FEN - NPO/NGT to LIWS, TPN VTE -SCDs, ok for chemical DVT prophylaxis from surgical standpoint Foley - d/c 2/23 Follow up - Dr. Marlou Starks   LOS: 6 days    Wellington Hampshire, [facility] 07/02/2019, 8:04 AM Please see Amion for pager number during day hours 7:00am-4:30pm

## 2019-07-02 NOTE — Progress Notes (Signed)
Paged Baltazar Najjar for order to insert PICC line.

## 2019-07-02 NOTE — Progress Notes (Signed)
   07/02/19 1000  Clinical Encounter Type  Visited With Patient  Visit Type Initial;Psychological support;Spiritual support  Referral From Chaplain  Consult/Referral To Chaplain  Spiritual Encounters  Spiritual Needs Emotional;Other (Comment) (Spiritual Care Conversation/Support)  Stress Factors  Patient Stress Factors Health changes   I spoke briefly with Hoyle Sauer per referral from the [facility]. Marley states that she "isn't doing too good" today. She did not state any needs at this time.    Please, contact Spiritual care for further assistance.   Chaplain Shanon Ace M.Div., [facility]

## 2019-07-02 NOTE — Progress Notes (Addendum)
PROGRESS NOTE    Gloria Murphy  [id number] DOB: Feb 21, 1936 DOA: 06/26/2019 PCP: Carlyle Basques, MD     Brief Narrative:  84 year old woman admitted from home on 2/17 due to nausea and vomiting with decreased p.o. intake.  In the ED she was found to have a large small bowel obstruction and gastric distention.  She was seen by surgery in the ED and NG tube was placed.  She has a history of a pelvic carcinoid tumor for which she underwent exploratory laparotomy in April 2019 with bilateral salpingo-oophorectomy and omentectomy as well as partial hysterectomy. S/p OR on 2/22.   Assessment & Plan:   Principal Problem:   SBO (small bowel obstruction) (HCC) Active Problems:   Malignant carcinoid tumor of unknown primary site [facility])   Type 2 diabetes mellitus without complication, without long-term current use of insulin (HCC)   AKI (acute kidney injury) (HCC)   Nausea and vomiting   Abdominal pain   Lactic acidosis   Elevated troponin   Sinus tachycardia   Small bowel obstruction Moderate protein caloric malnutrition -POD day 1 exploratory laparotomy with lysis of adhesions. -Continue NG suction and awaiting return of bowel function. -TPN via PICC line. -Continue IV fluids. -Surgery following.   Sinus tachycardia -Suspect intravascular volume depletion due to high volume NG output. -Heart rates currently 70s-80s.  Pelvic carcinoid tumor -Continue follow-up with GYN oncology, Dr. Denman George as an outpatient.  Acute renal failure -Likely due to intravascular volume depletion and acute tubular necrosis. -Creatinine on admission was 2.52, is down to 0.71 today. -Continue IV fluids  Type 2 diabetes -Managed at home with diet and exercise. -Continue sensitive sliding scale. -CBGs have been higher since starting TPN. -Pharmacy to adjust insulin in TPN.  Severe Hypokalemia -Likely due to NG suction. -K 3.7 today. -Mg ok at 2.5. -Recheck electrolytes in am.    DVT  prophylaxis: SCDs Code Status: Full code Family Communication: Patient only today, spoke with husband via phone on 2/22. Disposition Plan: Home pending return of bowel function  Consultants:   Surgery  Procedures:   NG tube placement 2/17  Exploratory laparotomy with lysis of adhesions on 2/22.  Antimicrobials:  Anti-infectives (From admission, onward)   Start     Dose/Rate Route Frequency Ordered Stop   07/01/19 1015  cefoTEtan (CEFOTAN) 2 g in sodium chloride 0.9 % 100 mL IVPB     2 g 200 mL/hr over 30 Minutes Intravenous On call to O.R. 07/01/19 0847 07/01/19 1234   06/27/19 1000  piperacillin-tazobactam (ZOSYN) IVPB 3.375 g  Status:  Discontinued     3.375 g 12.5 mL/hr over 240 Minutes Intravenous Every 8 hours 06/27/19 0807 06/29/19 1119   06/27/19 0200  piperacillin-tazobactam (ZOSYN) IVPB 2.25 g  Status:  Discontinued     2.25 g 100 mL/hr over 30 Minutes Intravenous Every 8 hours 06/26/19 2012 06/27/19 0807   06/26/19 1700  piperacillin-tazobactam (ZOSYN) IVPB 3.375 g     3.375 g 100 mL/hr over 30 Minutes Intravenous  Once 06/26/19 1651 06/26/19 1823       Subjective: Lying in bed, seems confused.  Sitter at bedside.  She complains of significant abdominal pain, no nausea or vomiting.  Objective: Vitals:   07/02/19 0534 07/02/19 0609 07/02/19 0720 07/02/19 0800  BP:  (!) 189/86 (!) 174/86   Pulse:  70 70   Resp:  16    Temp:  98.6 F (37 C)    TempSrc:  Oral    SpO2:  100%  100%  Weight: 77 kg     Height:        Intake/Output Summary (Last 24 hours) at 07/02/2019 1114 Last data filed at 07/02/2019 0639 Gross per 24 hour  Intake 3834.31 ml  Output 1080 ml  Net 2754.31 ml   Filed Weights   06/26/19 2057 07/02/19 0534  Weight: 66.1 kg 77 kg    Examination:  General exam: Awake, confused Respiratory system: Clear to auscultation. Respiratory effort normal. Cardiovascular system: Tachycardic, regular rhythm. No murmurs, rubs,  gallops. Gastrointestinal system: NG tube is in place abdomen is distended, soft and nontender to palpation. Hypoactive bowel sounds heard. Central nervous system: Moves all 4 spontaneously. Extremities: No C/C/E, +pedal pulses Skin: No rashes, lesions or ulcers Psychiatry: Unable to assess given current mental state.    Data Reviewed: I have personally reviewed following labs and imaging studies  CBC: Recent Labs  Lab 06/27/19 0316 06/27/19 0316 06/28/19 0419 06/29/19 0546 06/30/19 0454 07/01/19 1146 07/02/19 0410  WBC 5.7  --  9.6 10.7* 8.0  --  6.9  NEUTROABS  --   --   --   --   --   --  5.4  HGB 14.2   < > 13.9 12.5 10.8* 11.6* 10.6*  HCT 44.2   < > 43.2 40.8 34.6* 34.0* 33.8*  MCV 80.8  --  81.5 83.3 83.2  --  82.2  PLT 167  --  177 188 167  --  158   < > = values in this interval not displayed.   Basic Metabolic Panel: Recent Labs  Lab 06/26/19 1352 06/26/19 2107 06/27/19 0316 06/27/19 0316 06/28/19 0419 06/28/19 0419 06/29/19 0546 06/30/19 0454 07/01/19 0438 07/01/19 1146 07/02/19 0410  NA   < >  --  139   < > 144   < > 149* 146* 141 140 138  K   < >  --  3.7   < > 3.3*   < > 2.5* 2.9* 2.8* 3.1* 3.7  CL   < >  --  95*   < > 93*   < > 95* 100 103 101 105  CO2   < >  --  27   < > 37*  --  36* 34* 28  --  25  GLUCOSE   < >  --  197*   < > 172*   < > 132* 104* 157* 174* 292*  BUN   < >  --  51*   < > 53*   < > 45* 28* 17 12 16   CREATININE   < >  --  2.15*   < > 1.99*   < > 1.34* 0.95 0.72 0.70 0.71  CALCIUM   < >  --  9.3   < > 9.5  --  9.4 8.9 8.7*  --  8.6*  MG  --  2.3 2.3  --   --   --  2.5*  --   --   --  1.7  PHOS  --   --   --   --   --   --   --   --   --   --  1.8*   < > = values in this interval not displayed.   GFR: Estimated Creatinine Clearance: 57.6 mL/min (by C-G formula based on SCr of 0.71 mg/dL). Liver Function Tests: Recent Labs  Lab 06/26/19 1352 06/27/19 0316 07/02/19 0410  AST 31 25 38  ALT 22 19 31  ALKPHOS 85 71 64  BILITOT  1.3* 2.3* 1.3*  PROT 9.6* 7.8 6.2*  ALBUMIN 3.8 3.0* 2.5*   Recent Labs  Lab 06/26/19 1352  LIPASE 38   No results for input(s): AMMONIA in the last 168 hours. Coagulation Profile: Recent Labs  Lab 06/27/19 0316  INR 1.0   Cardiac Enzymes: No results for input(s): CKTOTAL, CKMB, CKMBINDEX, TROPONINI in the last 168 hours. BNP (last 3 results) No results for input(s): PROBNP in the last 8760 hours. HbA1C: No results for input(s): HGBA1C in the last 72 hours. CBG: Recent Labs  Lab 07/01/19 0632 07/01/19 1033 07/01/19 1745 07/01/19 2354 07/02/19 0920  GLUCAP 160* 141* 176* 294* 250*   Lipid Profile: Recent Labs    07/02/19 0410  TRIG 108   Thyroid Function Tests: No results for input(s): TSH, T4TOTAL, FREET4, T3FREE, THYROIDAB in the last 72 hours. Anemia Panel: No results for input(s): VITAMINB12, FOLATE, FERRITIN, TIBC, IRON, RETICCTPCT in the last 72 hours. Urine analysis:    Component Value Date/Time   COLORURINE YELLOW 08/28/2017 1057   APPEARANCEUR HAZY (A) 08/28/2017 1057   LABSPEC 1.020 10/25/2017 1931   PHURINE 5.5 10/25/2017 1931   GLUCOSEU NEGATIVE 10/25/2017 1931   HGBUR NEGATIVE 10/25/2017 1931   BILIRUBINUR NEGATIVE 10/25/2017 1931   KETONESUR NEGATIVE 10/25/2017 1931   PROTEINUR NEGATIVE 10/25/2017 1931   UROBILINOGEN 0.2 10/25/2017 1931   NITRITE NEGATIVE 10/25/2017 1931   LEUKOCYTESUR SMALL (A) 10/25/2017 1931   Sepsis Labs: @LABRCNTIP (procalcitonin:4,lacticidven:4)  ) Recent Results (from the past 240 hour(s))  Novel Coronavirus, NAA (Hosp order, Send-out to Ref Lab; TAT 18-24 hrs     Status: None   Collection Time: 06/24/19  9:16 AM   Specimen: Nasopharyngeal Swab; Respiratory  Result Value Ref Range Status   SARS-CoV-2, NAA NOT DETECTED NOT DETECTED Final    Comment: (NOTE) This nucleic acid amplification test was developed and its performance characteristics determined by [employer]. Nucleic acid amplification tests  include RT-PCR and TMA. This test has not been FDA cleared or approved. This test has been authorized by FDA under an Emergency Use Authorization (EUA). This test is only authorized for the duration of time the declaration that circumstances exist justifying the authorization of the emergency use of in vitro diagnostic tests for detection of SARS-CoV-2 virus and/or diagnosis of COVID-19 infection under section 564(b)(1) of the Act, 21 U.S.C. [id number]) (1), unless the authorization is terminated or revoked sooner. When diagnostic testing is negative, the possibility of a false negative result should be considered in the context of a patient's recent exposures and the presence of clinical signs and symptoms consistent with COVID-19. An individual without symptoms of COVID- 19 and who is not shedding SARS-CoV-2  virus would expect to have a negative (not detected) result in this assay. Performed At: [facility] [address] [place], [place] [id number] Rush Farmer MD [id number]    [place]  Final    Comment: Performed at [facility], [place] [address], [place], [place] [address]  Blood culture (routine x 2)     Status: None   Collection Time: 06/26/19  5:33 PM   Specimen: BLOOD  Result Value Ref Range Status   Specimen Description   Final    BLOOD LEFT ANTECUBITAL Performed at [place] [address] [place], [place] [address]    Special Requests   Final    BOTTLES DRAWN AEROBIC AND ANAEROBIC Blood Culture adequate volume Performed at [facility], [place] [address] [place], [place] [address]    Culture   Final    NO GROWTH 5 DAYS Performed at [facility], [place] [address], [place], [place] [address]    Report Status 07/01/2019 FINAL  Final  Respiratory Panel by RT PCR (Flu A&B, Covid) - Nasopharyngeal Swab     Status: None   Collection Time: 06/26/19  6:07 PM   Specimen: Nasopharyngeal  Swab  Result Value Ref Range Status   SARS Coronavirus 2 by RT PCR NEGATIVE NEGATIVE Final    Comment: (NOTE) SARS-CoV-2 target nucleic acids are NOT DETECTED. The SARS-CoV-2 RNA is generally detectable in upper respiratoy specimens during the acute phase of infection. The lowest concentration of SARS-CoV-2 viral copies this assay can detect is 131 copies/mL. A negative result does not preclude SARS-Cov-2 infection and should not be used as the sole basis for treatment or other patient management decisions. A negative result may occur with  improper specimen collection/handling, submission of specimen other than nasopharyngeal swab, presence of viral mutation(s) within the areas targeted by this assay, and inadequate number of viral copies (<131 copies/mL). A negative result must be combined with clinical observations, patient history, and epidemiological information. The expected result is Negative. Fact Sheet for Patients:  PinkCheek.be Fact Sheet for Healthcare Providers:  GravelBags.it This test is not yet ap proved or cleared by the [place] FDA and  has been authorized for detection and/or diagnosis of SARS-CoV-2 by FDA under an Emergency Use Authorization (EUA). This EUA will remain  in effect (meaning this test can be used) for the duration of the COVID-19 declaration under Section 564(b)(1) of the Act, 21 U.S.C. section 360bbb-3(b)(1), unless the authorization is terminated or revoked sooner.    Influenza A by PCR NEGATIVE NEGATIVE Final   Influenza B by PCR NEGATIVE NEGATIVE Final    Comment: (NOTE) The Xpert Xpress SARS-CoV-2/FLU/RSV assay is intended as an aid in  the diagnosis of influenza from Nasopharyngeal swab specimens and  should not be used as a sole basis for treatment. Nasal washings and  aspirates are unacceptable for Xpert Xpress SARS-CoV-2/FLU/RSV  testing. Fact Sheet for  Patients: PinkCheek.be Fact Sheet for Healthcare Providers: GravelBags.it This test is not yet approved or cleared by the [place] FDA and  has been authorized for detection and/or diagnosis of SARS-CoV-2 by  FDA under an Emergency Use Authorization (EUA). This EUA will remain  in effect (meaning this test can be used) for the duration of the  Covid-19 declaration under Section 564(b)(1) of the Act, 21  U.S.C. section 360bbb-3(b)(1), unless the authorization is  terminated or revoked. Performed at [facility], [place] [address], [place], [place] [address]   Blood culture (routine x 2)     Status: None   Collection Time: 06/26/19  6:12 PM   Specimen: Site Not Specified; Blood  Result Value Ref Range Status   Specimen Description   Final    SITE NOT SPECIFIED Performed at [place] [address] [place], [place] [address]    Special Requests   Final    BOTTLES DRAWN AEROBIC AND ANAEROBIC Blood Culture adequate volume Performed at [place] [address] [place], [place] [address]    Culture   Final    NO GROWTH 5 DAYS Performed at [facility], [place] [address] [place], [place] [address]    Report Status 07/01/2019 FINAL  Final  Radiology Studies: [place] EKG SITE RITE  Result Date: 07/01/2019 If [facility] image not attached, placement could not be confirmed due to current cardiac rhythm.       Scheduled Meds: . Chlorhexidine Gluconate Cloth  6 each Topical Daily  . insulin aspart  0-15 Units Subcutaneous Q4H  . mouth rinse  15 mL Mouth Rinse BID  . polyvinyl alcohol  1 drop Both Eyes TID  . sodium chloride flush  3 mL Intravenous Once  . sodium chloride flush  3 mL Intravenous Q12H   Continuous Infusions: . acetaminophen    . dextrose 5 % and 0.45% NaCl 100 mL/hr at 07/02/19 0842  . lactated ringers 50 mL/hr at 07/01/19 1615   . magnesium sulfate bolus IVPB    . methocarbamol (ROBAXIN) IV 500 mg (07/02/19 0531)  . potassium PHOSPHATE IVPB (in mmol) 20 mmol (07/02/19 0839)  . TPN ADULT (ION) 35 mL/hr at 07/01/19 1757  . TPN ADULT (ION)       LOS: 6 days    Time spent: 25 minutes. Greater than 50% of this time was spent in direct contact with the patient, coordinating care and discussing relevant ongoing clinical issues.    Lelon Frohlich, MD [facility] Pager [phone]  If 7PM-7AM, please contact night-coverage [email or web address] Password [facility] 07/02/2019, 11:14 AM

## 2019-07-02 NOTE — Progress Notes (Signed)
PHARMACY - TOTAL PARENTERAL NUTRITION CONSULT NOTE   Indication: bowel obstruction  Patient Measurements: Height: 5\' 9"  (175.3 cm) Weight: 169 lb 12.1 oz (77 kg) IBW/kg (Calculated) : 66.2 TPN AdjBW (KG): 66.1 Body mass index is 25.07 kg/m. Usual Weight:   Assessment: 84 yo woman with hx of pelvic carcinoid tumor, admitted with N/V, decreased po intake was found to have SBO and gastric distention.  Glucose / Insulin: hx of type II diabetes, controlled with diet. CBG 141-294, 7 units /24 hours (decadron x 1 on 2/22) Electrolytes: Phos 1.8, Mag low end of normal 1.7, all others WNL Renal: WNL LFTs / TGs: AST/ALT WNL, TBili 1.3, TGs 108 Prealbumin / albumin: 16.9 (2/19), 12.1 (2/23) Intake / Output; MIVF: D5 1/2 NS @ 100 ml/hr GI Imaging: 2/21 X-ray show persistently distended stomach Surgeries / Procedures: 2/22 exploratory laparotomy (n/a) lysis of adhesions  Central access: PICC 2/22 TPN start date: 2/22  Nutritional Goals RD recs KCal:1700-1900 , Protein:85-100gm , Fluid: </= 2 L/day Goal TPN rate is 75 mL/hr (provides 90 g of protein and 1821 kcals per day) Protein: 50g/L Lipids 20g/L Dextrose 18%  Current Nutrition:  npo  Plan:  Kphos 77mmol IV x1 Mag Sulfate 2gm IV x 1 continue TPN at 49mL/hr at 1800 ( will keep at same rate today due to high CBGs and low phos and mag) TPN will contain 20g/L of lipid emulsion  Electrolytes in TPN: 61mEq/L of Na, 55mEq/L of K, 9mEq/L of Ca, 50mEq/L of Mg, and 20mmol/L of Phos. Cl:Ac ratio 1:1 Add  trace elements to TPN daily and standard MVI on MWF only due to national backorder Change SSI to moderate q4h,  adjust as needed  Post op surgery continued MIVF at 167ml/hr BMP with mag and phos in am Monitor TPN labs on Mon/Thurs  Dolly Rias RPh 07/02/2019, 9:43 AM

## 2019-07-03 LAB — CBC
HCT: 34 % — ABNORMAL LOW (ref 36.0–46.0)
Hemoglobin: 11.2 g/dL — ABNORMAL LOW (ref 12.0–15.0)
MCH: 26.4 pg (ref 26.0–34.0)
MCHC: 32.9 g/dL (ref 30.0–36.0)
MCV: 80 fL (ref 80.0–100.0)
Platelets: 135 10*3/uL — ABNORMAL LOW (ref 150–400)
RBC: 4.25 MIL/uL (ref 3.87–5.11)
RDW: 16.3 % — ABNORMAL HIGH (ref 11.5–15.5)
WBC: 6.9 10*3/uL (ref 4.0–10.5)
nRBC: 0 % (ref 0.0–0.2)

## 2019-07-03 LAB — BASIC METABOLIC PANEL
Anion gap: 8 (ref 5–15)
BUN: 10 mg/dL (ref 8–23)
CO2: 20 mmol/L — ABNORMAL LOW (ref 22–32)
Calcium: 8.5 mg/dL — ABNORMAL LOW (ref 8.9–10.3)
Chloride: 108 mmol/L (ref 98–111)
Creatinine, Ser: 0.54 mg/dL (ref 0.44–1.00)
GFR calc Af Amer: >60 mL/min (ref >60)
GFR calc non Af Amer: >60 mL/min (ref >60)
Glucose, Bld: 129 mg/dL — ABNORMAL HIGH (ref 70–99)
Potassium: 3.2 mmol/L — ABNORMAL LOW (ref 3.5–5.1)
Sodium: 136 mmol/L (ref 135–145)

## 2019-07-03 LAB — GLUCOSE, CAPILLARY
Glucose-Capillary: 131 mg/dL — ABNORMAL HIGH (ref 70–99)
Glucose-Capillary: 131 mg/dL — ABNORMAL HIGH (ref 70–99)
Glucose-Capillary: 136 mg/dL — ABNORMAL HIGH (ref 70–99)
Glucose-Capillary: 154 mg/dL — ABNORMAL HIGH (ref 70–99)
Glucose-Capillary: 199 mg/dL — ABNORMAL HIGH (ref 70–99)
Glucose-Capillary: 63 mg/dL — ABNORMAL LOW (ref 70–99)
Glucose-Capillary: 70 mg/dL (ref 70–99)
Glucose-Capillary: 82 mg/dL (ref 70–99)

## 2019-07-03 LAB — PHOSPHORUS: Phosphorus: 2 mg/dL — ABNORMAL LOW (ref 2.5–4.6)

## 2019-07-03 LAB — MAGNESIUM: Magnesium: 1.7 mg/dL (ref 1.7–2.4)

## 2019-07-03 MED ORDER — POTASSIUM CHLORIDE CRYS ER 20 MEQ PO TBCR
40.0000 meq | EXTENDED_RELEASE_TABLET | Freq: Once | ORAL | Status: AC
  Administered 2019-07-03: 12:00:00 40 meq via ORAL
  Filled 2019-07-03: qty 2

## 2019-07-03 MED ORDER — METHOCARBAMOL 500 MG PO TABS
500.0000 mg | ORAL_TABLET | Freq: Three times a day (TID) | ORAL | Status: DC
  Administered 2019-07-03 – 2019-07-15 (×22): 500 mg via ORAL
  Filled 2019-07-03 (×23): qty 1

## 2019-07-03 MED ORDER — INSULIN ASPART 100 UNIT/ML ~~LOC~~ SOLN
0.0000 [IU] | Freq: Three times a day (TID) | SUBCUTANEOUS | Status: DC
  Administered 2019-07-04: 2 [IU] via SUBCUTANEOUS
  Administered 2019-07-04: 09:00:00 3 [IU] via SUBCUTANEOUS
  Administered 2019-07-04: 2 [IU] via SUBCUTANEOUS

## 2019-07-03 MED ORDER — ACETAMINOPHEN 325 MG PO TABS
650.0000 mg | ORAL_TABLET | Freq: Four times a day (QID) | ORAL | Status: DC
  Administered 2019-07-03 – 2019-07-04 (×4): 650 mg via ORAL
  Filled 2019-07-03 (×6): qty 2

## 2019-07-03 MED ORDER — POTASSIUM CHLORIDE 10 MEQ/100ML IV SOLN: 10.0000 meq | INTRAVENOUS | Status: DC

## 2019-07-03 MED ORDER — BOOST / RESOURCE BREEZE PO LIQD CUSTOM
1.0000 | Freq: Three times a day (TID) | ORAL | Status: DC
  Administered 2019-07-03 – 2019-07-07 (×7): 1 via ORAL

## 2019-07-03 MED ORDER — MAGNESIUM SULFATE 2 GM/50ML IV SOLN
2.0000 g | Freq: Once | INTRAVENOUS | Status: AC
  Administered 2019-07-03: 08:00:00 2 g via INTRAVENOUS
  Filled 2019-07-03: qty 50

## 2019-07-03 MED ORDER — MORPHINE SULFATE (PF) 2 MG/ML IV SOLN
1.0000 mg | INTRAVENOUS | Status: DC | PRN
  Administered 2019-07-06 – 2019-07-08 (×4): 2 mg via INTRAVENOUS
  Administered 2019-07-08: 10:00:00 1 mg via INTRAVENOUS
  Administered 2019-07-09: 11:00:00 2 mg via INTRAVENOUS
  Administered 2019-07-10: 1 mg via INTRAVENOUS
  Administered 2019-07-11: 14:00:00 2 mg via INTRAVENOUS
  Filled 2019-07-03 (×9): qty 1

## 2019-07-03 MED ORDER — DOCUSATE SODIUM 100 MG PO CAPS
100.0000 mg | ORAL_CAPSULE | Freq: Two times a day (BID) | ORAL | Status: DC
  Administered 2019-07-03 – 2019-07-07 (×4): 100 mg via ORAL
  Filled 2019-07-03 (×6): qty 1

## 2019-07-03 MED ORDER — POTASSIUM PHOSPHATES 15 MMOLE/5ML IV SOLN
20.0000 mmol | Freq: Once | INTRAVENOUS | Status: AC
  Administered 2019-07-03: 10:00:00 20 mmol via INTRAVENOUS
  Filled 2019-07-03: qty 6.67

## 2019-07-03 MED ORDER — TRAMADOL HCL 50 MG PO TABS
50.0000 mg | ORAL_TABLET | Freq: Four times a day (QID) | ORAL | Status: DC | PRN
  Administered 2019-07-12 – 2019-07-13 (×2): 50 mg via ORAL
  Filled 2019-07-03 (×2): qty 1

## 2019-07-03 NOTE — Progress Notes (Signed)
Wrist restraints reinitiated overnight to prevent patient from pulling out peripheral IV.

## 2019-07-03 NOTE — Progress Notes (Signed)
Pt resting in bed with wrist restraints off and sitter at bedside. VSS. No c/o pain. IV lines intact. Pt education given on importance of keeping IVs in.

## 2019-07-03 NOTE — Progress Notes (Signed)
IV team does not insert PICC lines on nights, therefore TPN on hold.

## 2019-07-03 NOTE — Progress Notes (Signed)
Per the notes from Margie Billet PA hold off on picc placement to see how patients tolerate POs.

## 2019-07-03 NOTE — Progress Notes (Signed)
PHARMACY - TOTAL PARENTERAL NUTRITION CONSULT NOTE   Indication: bowel obstruction  Patient Measurements: Height: 5\' 9"  (175.3 cm) Weight: 169 lb 12.1 oz (77 kg) IBW/kg (Calculated) : 66.2 TPN AdjBW (KG): 66.1 Body mass index is 25.07 kg/m. Usual Weight:   Assessment: 84 yo woman with hx of pelvic carcinoid tumor, admitted with N/V, decreased po intake was found to have SBO and gastric distention.  Glucose / Insulin: hx of type II diabetes, controlled with diet. CBG 70/290, 16 units /24 hours (decadron x 1 on 2/22) Electrolytes: Phos 2.0,K 3.2,  Mag low end of normal 1.7, all others WNL Renal: WNL LFTs / TGs: AST/ALT WNL, TBili 1.3, TGs 108 Prealbumin / albumin: 16.9 (2/19), 12.1 (2/23) Intake / Output; MIVF: D5 1/2 NS @ 100 ml/hr GI Imaging: 2/21 X-ray show persistently distended stomach Surgeries / Procedures: 2/22 exploratory laparotomy (n/a) lysis of adhesions  Central access: PICC 2/22, pt pulled PICC out on 2/23 ~ 2100 TPN start date: 2/22  Nutritional Goals RD recs KCal:1700-1900 , Protein:85-100gm , Fluid: </= 2 L/day Goal TPN rate is 75 mL/hr (provides 90 g of protein and 1821 kcals per day) Protein: 50g/L Lipids 20g/L Dextrose 18%  Current Nutrition:  Full liquid diet, boost ordered tid  Plan:  Kphos 73mmol IV x1 Mag Sulfate 2gm IV x 1 Kcl 24meq IV x 4 Per surgery hold off on replacing PICC/TPN and see how pt does with full liquids Adjust SSI to with meals,  adjust as needed  Post op surgery continued MIVF at 15ml/hr Monitor TPN labs on Mon/Thurs F/u if tolerated diet/need for TPN  Dolly Rias RPh 07/03/2019, 9:56 AM

## 2019-07-03 NOTE — Progress Notes (Signed)
PROGRESS NOTE    Gloria Murphy  [id number] DOB: [birth year] DOA: 06/26/2019 PCP: Carlyle Basques, MD    Brief Narrative:84 year old woman admitted from home on 2/17 due to nausea and vomiting with decreased p.o. intake.  In the ED she was found to have a large small bowel obstruction and gastric distention.  She was seen by surgery in the ED and NG tube was placed.  She has a history of a pelvic carcinoid tumor for which she underwent exploratory laparotomy in April 2019 with bilateral salpingo-oophorectomy and omentectomy as well as partial hysterectomy. S/p OR on 2/22.  Assessment & Plan:   Principal Problem:   SBO (small bowel obstruction) (HCC) Active Problems:   Malignant carcinoid tumor of unknown primary site [facility])   Type 2 diabetes mellitus without complication, without long-term current use of insulin (HCC)   AKI (acute kidney injury) (HCC)   Nausea and vomiting   Abdominal pain   Lactic acidosis   Elevated troponin   Sinus tachycardia    #1 small bowel obstruction status post exploratory laparotomy with lysis of adhesions 07/01/2019.  Patient remains on NG tube awaiting return of bowel function. Continue IV fluids and TPN.  Surgery following. Seen by PT recommends home health PT and rolling walker with 5 inch wheels.  Husband wants to take her home with home health. Advance to full liquids 07/03/2019  #2 history of pelvic carcinoid tumor followed by Dr. Denman George.  #3 AKI resolved likely secondary to intravascular volume depletion improved with IV fluids.  #4 type 2 diabetes not on any medications at home. CBG (last 3)  Recent Labs    07/03/19 0415 07/03/19 0736 07/03/19 1120  GLUCAP 131* 136* 154*    #5 hypokalemia repleted with p.o. potassium.  Nutrition Problem: Inadequate oral intake Etiology: inability to eat     Signs/Symptoms: NPO status    Interventions: Refer to RD note for recommendations  Estimated body mass index is 25.07 kg/m as  calculated from the following:   Height as of this encounter: 5\' 9"  (1.753 m).   Weight as of this encounter: 77 kg.  DVT prophylaxis: Lovenox Code Status: Full code Family Communication: None Disposition Plan: Patient came from home plan is to discharge her home once her bowel functions return.  She is admitted with small bowel obstruction status post lysis of adhesions..   Consultants:   General surgery  Procedures: NG tube placed 06/26/2019 exploratory laparotomy with lysis of adhesions on 07/01/2019 Antimicrobials: None  Subjective: Patient is awake she thinks she is 84 years old and got married when she is married for 3 years Had to have sitter overnight as she was confused and pulling PICC line and IV lines.  Objective: Vitals:   07/02/19 0800 07/02/19 1236 07/02/19 2132 07/03/19 0439  BP:  (!) 171/84 (!) 153/75 (!) 161/72  Pulse:  76 95 85  Resp:  20 18 16   Temp:  98.2 F (36.8 C) 98 F (36.7 C) 97.9 F (36.6 C)  TempSrc:  Oral Oral Oral  SpO2: 100% 100% 100% 100%  Weight:      Height:        Intake/Output Summary (Last 24 hours) at 07/03/2019 1301 Last data filed at 07/03/2019 1130 Gross per 24 hour  Intake 4609.96 ml  Output 1100 ml  Net 3509.96 ml   Filed Weights   06/26/19 2057 07/02/19 0534  Weight: 66.1 kg 77 kg    Examination: NG tube in place  General exam: Appears calm and  comfortable  Respiratory system: Clear to auscultation. Respiratory effort normal. Cardiovascular system: S1 & S2 heard, RRR. No JVD, murmurs, rubs, gallops or clicks. No pedal edema. Gastrointestinal system: Abdomen is distended, soft and nontender. No organomegaly or masses felt. Normal bowel sounds heard.  Bowel sounds is present. Central nervous system: Alert and oriented. No focal neurological deficits. Extremities: Symmetric 5 x 5 power. Skin: No rashes, lesions or ulcers Psychiatry: Judgement and insight appear normal. Mood & affect appropriate.     Data Reviewed: I  have personally reviewed following labs and imaging studies  CBC: Recent Labs  Lab 06/28/19 0419 06/28/19 0419 06/29/19 0546 06/30/19 0454 07/01/19 1146 07/02/19 0410 07/03/19 0424  WBC 9.6  --  10.7* 8.0  --  6.9 6.9  NEUTROABS  --   --   --   --   --  5.4  --   HGB 13.9   < > 12.5 10.8* 11.6* 10.6* 11.2*  HCT 43.2   < > 40.8 34.6* 34.0* 33.8* 34.0*  MCV 81.5  --  83.3 83.2  --  82.2 80.0  PLT 177  --  188 167  --  158 135*   < > = values in this interval not displayed.   Basic Metabolic Panel: Recent Labs  Lab 06/26/19 1352 06/26/19 2107 06/27/19 0316 06/28/19 0419 06/29/19 0546 06/29/19 0546 06/30/19 0454 07/01/19 0438 07/01/19 1146 07/02/19 0410 07/03/19 0424  NA   < >  --  139   < > 149*   < > 146* 141 140 138 136  K   < >  --  3.7   < > 2.5*   < > 2.9* 2.8* 3.1* 3.7 3.2*  CL   < >  --  95*   < > 95*   < > 100 103 101 105 108  CO2   < >  --  27   < > 36*  --  34* 28  --  25 20*  GLUCOSE   < >  --  197*   < > 132*   < > 104* 157* 174* 292* 129*  BUN   < >  --  51*   < > 45*   < > 28* 17 12 16 10   CREATININE   < >  --  2.15*   < > 1.34*   < > 0.95 0.72 0.70 0.71 0.54  CALCIUM   < >  --  9.3   < > 9.4  --  8.9 8.7*  --  8.6* 8.5*  MG  --  2.3 2.3  --  2.5*  --   --   --   --  1.7 1.7  PHOS  --   --   --   --   --   --   --   --   --  1.8* 2.0*   < > = values in this interval not displayed.   GFR: Estimated Creatinine Clearance: 57.6 mL/min (by C-G formula based on SCr of 0.54 mg/dL). Liver Function Tests: Recent Labs  Lab 06/26/19 1352 06/27/19 0316 07/02/19 0410  AST 31 25 38  ALT 22 19 31   ALKPHOS 85 71 64  BILITOT 1.3* 2.3* 1.3*  PROT 9.6* 7.8 6.2*  ALBUMIN 3.8 3.0* 2.5*   Recent Labs  Lab 06/26/19 1352  LIPASE 38   No results for input(s): AMMONIA in the last 168 hours. Coagulation Profile: Recent Labs  Lab 06/27/19 0316  INR 1.0  Cardiac Enzymes: No results for input(s): CKTOTAL, CKMB, CKMBINDEX, TROPONINI in the last 168 hours. BNP  (last 3 results) No results for input(s): PROBNP in the last 8760 hours. HbA1C: No results for input(s): HGBA1C in the last 72 hours. CBG: Recent Labs  Lab 07/03/19 0103 07/03/19 0205 07/03/19 0415 07/03/19 0736 07/03/19 1120  GLUCAP 70 82 131* 136* 154*   Lipid Profile: Recent Labs    07/02/19 0410  TRIG 108   Thyroid Function Tests: No results for input(s): TSH, T4TOTAL, FREET4, T3FREE, THYROIDAB in the last 72 hours. Anemia Panel: No results for input(s): VITAMINB12, FOLATE, FERRITIN, TIBC, IRON, RETICCTPCT in the last 72 hours. Sepsis Labs: Recent Labs  Lab 06/26/19 1807 06/26/19 2107 06/27/19 0746  LATICACIDVEN 3.6* 2.2* 1.6    Recent Results (from the past 240 hour(s))  Novel Coronavirus, NAA (Hosp order, Send-out to Ref Lab; TAT 18-24 hrs     Status: None   Collection Time: 06/24/19  9:16 AM   Specimen: Nasopharyngeal Swab; Respiratory  Result Value Ref Range Status   SARS-CoV-2, NAA NOT DETECTED NOT DETECTED Final    Comment: (NOTE) This nucleic acid amplification test was developed and its performance characteristics determined by [employer] and [employer]. Nucleic acid amplification tests include RT-PCR and TMA. This test has not been FDA cleared or approved. This test has been authorized by FDA under an Emergency Use Authorization (EUA). This test is only authorized for the duration of time the declaration that circumstances exist justifying the authorization of the emergency use of in vitro diagnostic tests for detection of SARS-CoV-2 virus and/or diagnosis of COVID-19 infection under section 564(b)(1) of the Act, 21 U.S.C. [id number]) (1), unless the authorization is terminated or revoked sooner. When diagnostic testing is negative, the possibility of a false negative result should be considered in the context of a patient's recent exposures and the presence of clinical signs and symptoms consistent with COVID-19. An individual without symptoms of  COVID- 19 and who is not shedding SARS-CoV-2  virus would expect to have a negative (not detected) result in this assay. Performed At: [facility] [address] [place], [place] [id number] Rush Farmer MD [id number]    [place]  Final    Comment: Performed at [facility], [place] [address], [place], [place] [address]  Blood culture (routine x 2)     Status: None   Collection Time: 06/26/19  5:33 PM   Specimen: BLOOD  Result Value Ref Range Status   Specimen Description   Final    BLOOD LEFT ANTECUBITAL Performed at [facility] [address], [place], [place] [address]    Special Requests   Final    BOTTLES DRAWN AEROBIC AND ANAEROBIC Blood Culture adequate volume Performed at [facility] [address], [place], [place] [address]    Culture   Final    NO GROWTH 5 DAYS Performed at [facility], [place] [address] [place],  [address]    Report Status 07/01/2019 FINAL  Final  Respiratory Panel by RT PCR (Flu A&B, Covid) - Nasopharyngeal Swab     Status: None   Collection Time: 06/26/19  6:07 PM   Specimen: Nasopharyngeal Swab  Result Value Ref Range Status   SARS Coronavirus 2 by RT PCR NEGATIVE NEGATIVE Final    Comment: (NOTE) SARS-CoV-2 target nucleic acids are NOT DETECTED. The SARS-CoV-2 RNA is generally detectable in upper respiratoy specimens during the acute phase of infection. The lowest concentration of  SARS-CoV-2 viral copies this assay can detect is 131 copies/mL. A negative result does not preclude SARS-Cov-2 infection and should not be used as the sole basis for treatment or other patient management decisions. A negative result may occur with  improper specimen collection/handling, submission of specimen other than nasopharyngeal swab, presence of viral mutation(s) within the areas targeted by this assay, and inadequate number of viral copies (<131 copies/mL). A  negative result must be combined with clinical observations, patient history, and epidemiological information. The expected result is Negative. Fact Sheet for Patients:  PinkCheek.be Fact Sheet for Healthcare Providers:  GravelBags.it This test is not yet ap proved or cleared by the [place] FDA and  has been authorized for detection and/or diagnosis of SARS-CoV-2 by FDA under an Emergency Use Authorization (EUA). This EUA will remain  in effect (meaning this test can be used) for the duration of the COVID-19 declaration under Section 564(b)(1) of the Act, 21 U.S.C. section 360bbb-3(b)(1), unless the authorization is terminated or revoked sooner.    Influenza A by PCR NEGATIVE NEGATIVE Final   Influenza B by PCR NEGATIVE NEGATIVE Final    Comment: (NOTE) The Xpert Xpress SARS-CoV-2/FLU/RSV assay is intended as an aid in  the diagnosis of influenza from Nasopharyngeal swab specimens and  should not be used as a sole basis for treatment. Nasal washings and  aspirates are unacceptable for Xpert Xpress SARS-CoV-2/FLU/RSV  testing. Fact Sheet for Patients: PinkCheek.be Fact Sheet for Healthcare Providers: GravelBags.it This test is not yet approved or cleared by the [place] FDA and  has been authorized for detection and/or diagnosis of SARS-CoV-2 by  FDA under an Emergency Use Authorization (EUA). This EUA will remain  in effect (meaning this test can be used) for the duration of the  Covid-19 declaration under Section 564(b)(1) of the Act, 21  U.S.C. section 360bbb-3(b)(1), unless the authorization is  terminated or revoked. Performed at [facility], [place] [address], [place], [place] [address]   Blood culture (routine x 2)     Status: None   Collection Time: 06/26/19  6:12 PM   Specimen: Site Not Specified; Blood  Result Value Ref Range  Status   Specimen Description   Final    SITE NOT SPECIFIED Performed at [facility] [address], [place], [place] [address]    Special Requests   Final    BOTTLES DRAWN AEROBIC AND ANAEROBIC Blood Culture adequate volume Performed at [facility] [address], [place], [place] [address]    Culture   Final    NO GROWTH 5 DAYS Performed at [facility], [place] [address] [place], [place] [address]    Report Status 07/01/2019 FINAL  Final         Radiology Studies: [place] EKG SITE RITE  Result Date: 07/02/2019 If [facility] image not attached, placement could not be confirmed due to current cardiac rhythm.       Scheduled Meds: . acetaminophen  650 mg Oral Q6H  . Chlorhexidine Gluconate Cloth  6 each Topical Daily  . docusate sodium  100 mg Oral BID  . feeding supplement  1 Container Oral TID BM  . insulin aspart  0-15 Units Subcutaneous TID WC  . mouth rinse  15 mL Mouth Rinse BID  . methocarbamol  500 mg Oral Q8H  . polyvinyl alcohol  1 drop Both Eyes TID  . sodium chloride flush  3 mL Intravenous Once  . sodium chloride  flush  3 mL Intravenous Q12H   Continuous Infusions: . dextrose 5 % and 0.45% NaCl 100 mL/hr at 07/03/19 1012  . lactated ringers Stopped (07/02/19 1500)  . potassium PHOSPHATE IVPB (in mmol) 20 mmol (07/03/19 1018)     LOS: 7 days     Georgette Shell, MD 07/03/2019, 1:01 PM

## 2019-07-03 NOTE — Progress Notes (Signed)
Pt husband at bedside. Pt continues to rest in bed with restraints off and sitter at bedside. No behaviors noted.

## 2019-07-03 NOTE — Progress Notes (Signed)
[facility] Progress Note  2 Days Post-Op  Subjective: CC-  Still confused this morning, but much more await and alert. Pulling out PICC line last night. Tolerating clear liquids. Denies n/v. Unsure if passing any flatus. No BM.  Objective: Vital signs in last 24 hours: Temp:  [97.9 F (36.6 C)-98.2 F (36.8 C)] 97.9 F (36.6 C) (02/24 0439) Pulse Rate:  [76-95] 85 (02/24 0439) Resp:  [16-20] 16 (02/24 0439) BP: (153-171)/(72-84) 161/72 (02/24 0439) SpO2:  [100 %] 100 % (02/24 0439) Last BM Date: (SBO)  Intake/Output from previous day: 02/23 0701 - 02/24 0700 In: 3810 [I.V.:2269; IV Piggyback:1541] Out: 1550 [Urine:1500; Emesis/NG output:50] Intake/Output this shift: No intake/output data recorded.  PE: [id number], NAD Pulm: rate and effort normal Abd: Soft,mild distension, +BS, mild diffuse tenderness, midline incision cdi with staples intact and honeycomb dressing in place/ trace dried blood on dressing  Lab Results:  Recent Labs    07/02/19 0410 07/03/19 0424  WBC 6.9 6.9  HGB 10.6* 11.2*  HCT 33.8* 34.0*  PLT 158 135*   BMET Recent Labs    07/02/19 0410 07/03/19 0424  NA 138 136  K 3.7 3.2*  CL 105 108  CO2 25 20*  GLUCOSE 292* 129*  BUN 16 10  CREATININE 0.71 0.54  CALCIUM 8.6* 8.5*   PT/INR No results for input(s): LABPROT, INR in the last 72 hours. CMP     Component Value Date/Time   NA 136 07/03/2019 0424   NA 142 02/26/2019 1618   K 3.2 (L) 07/03/2019 0424   CL 108 07/03/2019 0424   CO2 20 (L) 07/03/2019 0424   GLUCOSE 129 (H) 07/03/2019 0424   BUN 10 07/03/2019 0424   BUN 19 02/26/2019 1618   CREATININE 0.54 07/03/2019 0424   CALCIUM 8.5 (L) 07/03/2019 0424   PROT 6.2 (L) 07/02/2019 0410   PROT 7.7 02/04/2019 1719   ALBUMIN 2.5 (L) 07/02/2019 0410   ALBUMIN 4.1 02/04/2019 1719   AST 38 07/02/2019 0410   ALT 31 07/02/2019 0410   ALKPHOS 64 07/02/2019 0410   BILITOT 1.3 (H) 07/02/2019 0410   BILITOT 0.4 02/04/2019  1719   GFRNONAA >60 07/03/2019 0424   GFRAA >60 07/03/2019 0424   Lipase     Component Value Date/Time   LIPASE 38 06/26/2019 1352       Studies/Results: [place] EKG SITE RITE  Result Date: 07/02/2019 If Site Rite image not attached, placement could not be confirmed due to current cardiac rhythm.   Anti-infectives: Anti-infectives (From admission, onward)   Start     Dose/Rate Route Frequency Ordered Stop   07/01/19 1015  cefoTEtan (CEFOTAN) 2 g in sodium chloride 0.9 % 100 mL IVPB     2 g 200 mL/hr over 30 Minutes Intravenous On call to O.R. 07/01/19 0847 07/01/19 1234   06/27/19 1000  piperacillin-tazobactam (ZOSYN) IVPB 3.375 g  Status:  Discontinued     3.375 g 12.5 mL/hr over 240 Minutes Intravenous Every 8 hours 06/27/19 0807 06/29/19 1119   06/27/19 0200  piperacillin-tazobactam (ZOSYN) IVPB 2.25 g  Status:  Discontinued     2.25 g 100 mL/hr over 30 Minutes Intravenous Every 8 hours 06/26/19 2012 06/27/19 0807   06/26/19 1700  piperacillin-tazobactam (ZOSYN) IVPB 3.375 g     3.375 g 100 mL/hr over 30 Minutes Intravenous  Once 06/26/19 1651 06/26/19 1823       Assessment/Plan DM Lactic acidosis - resolved AKI - improved, Cr0.54 Hypokalemia Protein calorie Malnutrition -  prealbumin 12.1 (2/23) H/o Carcinoid tumor s/p ex lapwith bilateral salpingo-oophorectomy, omentectomy and radical debulkingfor suspected ovarian cancer 08/31/2017 Dr. Denman George  SBO  S/p EXPLORATORY LAPAROTOMY (N/A)LYSIS OF ADHESIONS 2/22 Dr. Marlou Starks - POD#2 - Mobilize, continue PT/OT - Advance to full liquids, add Boost, colace. Ok to hold on replacing PICC/TPN and see how she does with fulls. Change to oral pain medications.   ID -zosyn 2/17>>2/20, cefotetan periop x1 FEN - IVF, FLD, Boost VTE -SCDs, ok for chemical DVT prophylaxis from surgical standpoint Foley - d/c 2/23 Follow up - Dr. Marlou Starks   LOS: 7 days    [place], [facility] 07/03/2019, 9:11 AM Please  see Amion for pager number during day hours 7:00am-4:30pm

## 2019-07-03 NOTE — Progress Notes (Signed)
CBG 63 at 00:30, recheck 70. Paged Baltazar Najjar.

## 2019-07-03 NOTE — TOC Initial Note (Signed)
Transition of Care [facility]) - Initial/Assessment Note    Patient Details  Name: Gloria Murphy MRN: [id number] Date of Birth: 1935-11-15  [facility] CM/SW Contact:    Dessa Phi, RN Phone Number: 07/03/2019, 2:15 PM  Clinical Narrative:Spoke to spouse about d/c plans-home w/HHC-no preference Checking on [facility] agency able to accept for HHPT/OT-await response. [employer] rep Zach for dme rw,3n1 to deliver to rm prior d/c.                  Expected Discharge Plan: [place] Barriers to Discharge: Continued Medical Work up   Patient Goals and CMS Choice Patient states their goals for this hospitalization and ongoing recovery are:: go home CMS Medicare.gov Compare Post Acute Care list provided to:: Patient Represenative (must comment)(spouse) Choice offered to / list presented to : Spouse  Expected Discharge Plan and Services Expected Discharge Plan: [place]   Discharge Planning Services: CM Consult   Living arrangements for the past 2 months: Single Family Home                 DME Arranged: 3-N-1, Walker rolling DME Agency: [employer] Date DME Agency Contacted: 07/03/19 Time DME Agency Contacted: [phone] Representative spoke with at DME Agency: [place] Arranged: PT, OT          Prior Living Arrangements/Services Living arrangements for the past 2 months: [place] with:: Spouse Patient language and need for interpreter reviewed:: Yes Do you feel safe going back to the place where you live?: Yes      Need for Family Participation in Patient Care: No (Comment) Care giver support system in place?: Yes (comment)   Criminal Activity/Legal Involvement Pertinent to Current Situation/Hospitalization: No - Comment as needed  Activities of Daily Living Home Assistive Devices/Equipment: None ADL Screening (condition at time of admission) Patient's cognitive ability adequate to safely complete daily activities?:  No Is the patient deaf or have difficulty hearing?: Yes Does the patient have difficulty seeing, even when wearing glasses/contacts?: No Does the patient have difficulty concentrating, remembering, or making decisions?: No Patient able to express need for assistance with ADLs?: Yes Does the patient have difficulty dressing or bathing?: Yes Independently performs ADLs?: No Communication: Independent Dressing (OT): Needs assistance Is this a change from baseline?: Change from baseline, expected to last <3days Grooming: Needs assistance Is this a change from baseline?: Change from baseline, expected to last <3 days Feeding: Independent Bathing: Needs assistance Is this a change from baseline?: Change from baseline, expected to last <3 days Toileting: Needs assistance Is this a change from baseline?: Change from baseline, expected to last <3 days In/Out Bed: Needs assistance Is this a change from baseline?: Change from baseline, expected to last <3 days Walks in Home: Needs assistance Is this a change from baseline?: Change from baseline, expected to last <3 days Does the patient have difficulty walking or climbing stairs?: Yes Weakness of Legs: Both Weakness of Arms/Hands: Both  Permission Sought/Granted Permission sought to share information with : Case Manager Permission granted to share information with : Yes, Verbal Permission Granted  Share Information with NAME: Case Manager  Permission granted to share info w AGENCY: [facility]  Permission granted to share info w Relationship: Saddie Vago spouse  Permission granted to share info w Contact Information: [phone]  Emotional Assessment Appearance:: Appears stated age Attitude/Demeanor/Rapport: Gracious Affect (typically observed): Accepting   Alcohol / Substance Use: Not Applicable Psych Involvement:  No (comment)  Admission diagnosis:  SBO (small bowel obstruction) ([address]) [K56.609] Pneumatosis intestinalis [K63.89] Abdominal  pain, unspecified abdominal location [R10.9] Type 2 diabetes mellitus without complication, without long-term current use of insulin (HCC) [E11.9] Nausea and vomiting, intractability of vomiting not specified, unspecified vomiting type [R11.2] Patient Active Problem List   Diagnosis Date Noted  . Sinus tachycardia 06/27/2019  . Type 2 diabetes mellitus without complication, without long-term current use of insulin ([place]) 06/26/2019  . SBO (small bowel obstruction) ([place]) 06/26/2019  . AKI (acute kidney injury) ([place]) 06/26/2019  . Nausea and vomiting 06/26/2019  . Abdominal pain 06/26/2019  . Lactic acidosis 06/26/2019  . Elevated troponin 06/26/2019  . Essential hypertension 02/22/2019  . Exertional dyspnea 02/04/2019  . Vision disturbance 12/19/2018  . TIA (transient ischemic attack) 12/19/2018  . Malignant carcinoid tumor of unknown primary site ([place]) 10/06/2017  . Pelvic mass 08/31/2017  . Elevated CA-125 08/31/2017  . Pelvic mass in female 08/31/2017   PCP:  Carlyle Basques, MD Pharmacy:   [facility], [place] [address] Phone: [phone] Fax: [place], [place] [address] [place] [address] Phone: [phone] Fax: [phone]     Social Determinants of Health (SDOH) Interventions    Readmission Risk Interventions No flowsheet data found.

## 2019-07-04 ENCOUNTER — Inpatient Hospital Stay: Payer: Self-pay

## 2019-07-04 ENCOUNTER — Inpatient Hospital Stay (HOSPITAL_COMMUNITY): Payer: Medicare Other

## 2019-07-04 LAB — COMPREHENSIVE METABOLIC PANEL
ALT: 35 U/L (ref 0–44)
AST: 31 U/L (ref 15–41)
Albumin: 2.5 g/dL — ABNORMAL LOW (ref 3.5–5.0)
Alkaline Phosphatase: 86 U/L (ref 38–126)
Anion gap: 9 (ref 5–15)
BUN: 8 mg/dL (ref 8–23)
CO2: 20 mmol/L — ABNORMAL LOW (ref 22–32)
Calcium: 8.3 mg/dL — ABNORMAL LOW (ref 8.9–10.3)
Chloride: 105 mmol/L (ref 98–111)
Creatinine, Ser: 0.63 mg/dL (ref 0.44–1.00)
GFR calc Af Amer: >60 mL/min (ref >60)
GFR calc non Af Amer: >60 mL/min (ref >60)
Glucose, Bld: 192 mg/dL — ABNORMAL HIGH (ref 70–99)
Potassium: 3.6 mmol/L (ref 3.5–5.1)
Sodium: 134 mmol/L — ABNORMAL LOW (ref 135–145)
Total Bilirubin: 1 mg/dL (ref 0.3–1.2)
Total Protein: 6.3 g/dL — ABNORMAL LOW (ref 6.5–8.1)

## 2019-07-04 LAB — GLUCOSE, CAPILLARY
Glucose-Capillary: 122 mg/dL — ABNORMAL HIGH (ref 70–99)
Glucose-Capillary: 139 mg/dL — ABNORMAL HIGH (ref 70–99)
Glucose-Capillary: 147 mg/dL — ABNORMAL HIGH (ref 70–99)
Glucose-Capillary: 152 mg/dL — ABNORMAL HIGH (ref 70–99)
Glucose-Capillary: 167 mg/dL — ABNORMAL HIGH (ref 70–99)
Glucose-Capillary: 178 mg/dL — ABNORMAL HIGH (ref 70–99)
Glucose-Capillary: 207 mg/dL — ABNORMAL HIGH (ref 70–99)

## 2019-07-04 LAB — MAGNESIUM: Magnesium: 1.6 mg/dL — ABNORMAL LOW (ref 1.7–2.4)

## 2019-07-04 LAB — PHOSPHORUS: Phosphorus: 1.6 mg/dL — ABNORMAL LOW (ref 2.5–4.6)

## 2019-07-04 MED ORDER — METOCLOPRAMIDE HCL 5 MG/ML IJ SOLN
5.0000 mg | Freq: Three times a day (TID) | INTRAMUSCULAR | Status: DC | PRN
  Filled 2019-07-04: qty 2

## 2019-07-04 MED ORDER — SODIUM CHLORIDE 0.9% FLUSH
10.0000 mL | Freq: Two times a day (BID) | INTRAVENOUS | Status: DC
  Administered 2019-07-05 – 2019-07-23 (×23): 10 mL

## 2019-07-04 MED ORDER — POTASSIUM PHOSPHATES 15 MMOLE/5ML IV SOLN
30.0000 mmol | Freq: Once | INTRAVENOUS | Status: AC
  Administered 2019-07-04: 11:00:00 30 mmol via INTRAVENOUS
  Filled 2019-07-04: qty 10

## 2019-07-04 MED ORDER — TRAVASOL 10 % IV SOLN
INTRAVENOUS | Status: AC
  Filled 2019-07-04: qty 480

## 2019-07-04 MED ORDER — SODIUM CHLORIDE 0.9% FLUSH
10.0000 mL | INTRAVENOUS | Status: DC | PRN
  Administered 2019-07-04: 18:00:00 20 mL
  Administered 2019-07-05 – 2019-07-20 (×5): 10 mL

## 2019-07-04 MED ORDER — CHLORHEXIDINE GLUCONATE CLOTH 2 % EX PADS
6.0000 | MEDICATED_PAD | Freq: Every day | CUTANEOUS | Status: DC
  Administered 2019-07-04 – 2019-07-23 (×20): 6 via TOPICAL

## 2019-07-04 MED ORDER — ADULT MULTIVITAMIN W/MINERALS CH: 1.0000 | ORAL_TABLET | Freq: Every day | ORAL | Status: DC

## 2019-07-04 MED ORDER — MAGNESIUM SULFATE 2 GM/50ML IV SOLN
2.0000 g | Freq: Once | INTRAVENOUS | Status: AC
  Administered 2019-07-04: 09:00:00 2 g via INTRAVENOUS
  Filled 2019-07-04: qty 50

## 2019-07-04 MED ORDER — METOCLOPRAMIDE HCL 5 MG/ML IJ SOLN
5.0000 mg | Freq: Once | INTRAMUSCULAR | Status: AC
  Administered 2019-07-04: 17:00:00 5 mg via INTRAVENOUS
  Filled 2019-07-04: qty 2

## 2019-07-04 MED ORDER — DEXTROSE-NACL 5-0.45 % IV SOLN: INTRAVENOUS | Status: DC

## 2019-07-04 MED ORDER — PRO-STAT SUGAR FREE PO LIQD
30.0000 mL | Freq: Two times a day (BID) | ORAL | Status: DC
  Administered 2019-07-04 – 2019-07-07 (×2): 30 mL via ORAL
  Filled 2019-07-04 (×2): qty 30

## 2019-07-04 MED ORDER — POTASSIUM CHLORIDE CRYS ER 20 MEQ PO TBCR
40.0000 meq | EXTENDED_RELEASE_TABLET | Freq: Once | ORAL | Status: AC
  Administered 2019-07-04: 09:00:00 40 meq via ORAL
  Filled 2019-07-04: qty 2

## 2019-07-04 NOTE — Progress Notes (Signed)
PROGRESS NOTE    Gloria Murphy  [id number] DOB: 09/12/1935 DOA: 06/26/2019 PCP: Carlyle Basques, MD   Brief Narrative: :84 year old woman admitted from home on 2/17 due to nausea and vomiting with decreased p.o. intake. In the ED she was found to have a large small bowel obstruction and gastric distention. She was seen by surgery in the ED and NG tube was placed. She has a history of a pelvic carcinoid tumor for which she underwent exploratory laparotomy in April 2019 with bilateral salpingo-oophorectomy and omentectomy as well as partial hysterectomy. S/p OR on 2/22  Assessment & Plan:   Principal Problem:   SBO (small bowel obstruction) (HCC) Active Problems:   Malignant carcinoid tumor of unknown primary site [facility])   Type 2 diabetes mellitus without complication, without long-term current use of insulin (HCC)   AKI (acute kidney injury) (HCC)   Nausea and vomiting   Abdominal pain   Lactic acidosis   Elevated troponin   Sinus tachycardia  #1 small bowel obstruction status post exploratory laparotomy with lysis of adhesions 07/01/2019.  NG tube was DC'd 07/03/2019 was started on soft diet but patient started to have nausea with abdominal distention.  KUB show ileus versus recurrent obstruction. PICC placed TPN to be continued.  Surgery following. Seen by PT recommends home health PT and rolling walker with 5 inch wheels.  Husband wants to take her home with home health.  #2 history of pelvic carcinoid tumor followed by Dr. Denman George.  #3 AKI resolved likely secondary to intravascular volume depletion improved with IV fluids.  #4 type 2 diabetes not on any medications at home  #5 hypokalemia repleted with p.o. potassium      Nutrition Problem: Increased nutrient needs Etiology: acute illness, post-op healing     Signs/Symptoms: estimated needs    Interventions: Boost Breeze, Prostat, MVI  Estimated body mass index is 24.61 kg/m as calculated from the  following:   Height as of this encounter: 5\' 9"  (1.753 m).   Weight as of this encounter: 75.6 kg.  DVT prophylaxis: Lovenox Code Status: Full code Family Communication: None Disposition Plan: Patient came from home plan is to discharge her home once her bowel functions return.  She is admitted with small bowel obstruction status post lysis of adhesions.. She has recurrent ileus or recurrent small bowel obstruction by KUB.  PICC line placed for TPN Surgery following not ready for discharge yet   Consultants:   General surgery  Procedures: NG tube placed 06/26/2019 exploratory laparotomy with lysis of adhesions on 07/01/2019 Antimicrobials: None   Subjective:  She is awake staff was trying to feed her soft diet KUB today increasing small bowel dilatation could be ileus versus recurrent small bowel obstruction Objective: Vitals:   07/04/19 0614 07/04/19 0845 07/04/19 1315 07/04/19 1324  BP: (!) 162/79 (!) 178/84 (!) 179/72 (!) 157/75  Pulse: 89 87 75 72  Resp: (!) 24 (!) 28 16   Temp: 98 F (36.7 C) 99.4 F (37.4 C) (!) 97.4 F (36.3 C)   TempSrc: Oral Oral Oral   SpO2: 100% 100% 100%   Weight:      Height:        Intake/Output Summary (Last 24 hours) at 07/04/2019 1526 Last data filed at 07/04/2019 1400 Gross per 24 hour  Intake 2609.76 ml  Output 900 ml  Net 1709.76 ml   Filed Weights   06/26/19 2057 07/02/19 0534 07/04/19 0300  Weight: 66.1 kg 77 kg 75.6 kg    Examination:  General exam: Appears calm and comfortable  Respiratory system: Clear to auscultation. Respiratory effort normal. Cardiovascular system: S1 & S2 heard, RRR. No JVD, murmurs, rubs, gallops or clicks. No pedal edema. Gastrointestinal system: Abdomen is distended, soft and nontender. No organomegaly or masses felt.  Diminished bowel sounds heard. Central nervous system: Alert and oriented. No focal neurological deficits. Extremities: Trace edema Skin: No rashes, lesions or ulcers  Psychiatry: Judgement and insight appear normal. Mood & affect appropriate.     Data Reviewed: I have personally reviewed following labs and imaging studies  CBC: Recent Labs  Lab 06/28/19 0419 06/28/19 0419 06/29/19 0546 06/30/19 0454 07/01/19 1146 07/02/19 0410 07/03/19 0424  WBC 9.6  --  10.7* 8.0  --  6.9 6.9  NEUTROABS  --   --   --   --   --  5.4  --   HGB 13.9   < > 12.5 10.8* 11.6* 10.6* 11.2*  HCT 43.2   < > 40.8 34.6* 34.0* 33.8* 34.0*  MCV 81.5  --  83.3 83.2  --  82.2 80.0  PLT 177  --  188 167  --  158 135*   < > = values in this interval not displayed.   Basic Metabolic Panel: Recent Labs  Lab 06/29/19 0546 06/29/19 0546 06/30/19 0454 06/30/19 0454 07/01/19 0438 07/01/19 1146 07/02/19 0410 07/03/19 0424 07/04/19 0452  NA 149*   < > 146*   < > 141 140 138 136 134*  K 2.5*   < > 2.9*   < > 2.8* 3.1* 3.7 3.2* 3.6  CL 95*   < > 100   < > 103 101 105 108 105  CO2 36*   < > 34*  --  28  --  25 20* 20*  GLUCOSE 132*   < > 104*   < > 157* 174* 292* 129* 192*  BUN 45*   < > 28*   < > 17 12 16 10 8   CREATININE 1.34*   < > 0.95   < > 0.72 0.70 0.71 0.54 0.63  CALCIUM 9.4   < > 8.9  --  8.7*  --  8.6* 8.5* 8.3*  MG 2.5*  --   --   --   --   --  1.7 1.7 1.6*  PHOS  --   --   --   --   --   --  1.8* 2.0* 1.6*   < > = values in this interval not displayed.   GFR: Estimated Creatinine Clearance: 57.6 mL/min (by C-G formula based on SCr of 0.63 mg/dL). Liver Function Tests: Recent Labs  Lab 07/02/19 0410 07/04/19 0452  AST 38 31  ALT 31 35  ALKPHOS 64 86  BILITOT 1.3* 1.0  PROT 6.2* 6.3*  ALBUMIN 2.5* 2.5*   No results for input(s): LIPASE, AMYLASE in the last 168 hours. No results for input(s): AMMONIA in the last 168 hours. Coagulation Profile: No results for input(s): INR, PROTIME in the last 168 hours. Cardiac Enzymes: No results for input(s): CKTOTAL, CKMB, CKMBINDEX, TROPONINI in the last 168 hours. BNP (last 3 results) No results for input(s):  PROBNP in the last 8760 hours. HbA1C: No results for input(s): HGBA1C in the last 72 hours. CBG: Recent Labs  Lab 07/03/19 1120 07/03/19 1615 07/03/19 2339 07/04/19 0424 07/04/19 1110  GLUCAP 154* 199* 131* 152* 139*   Lipid Profile: Recent Labs    07/02/19 0410  TRIG 108   Thyroid Function Tests:  No results for input(s): TSH, T4TOTAL, FREET4, T3FREE, THYROIDAB in the last 72 hours. Anemia Panel: No results for input(s): VITAMINB12, FOLATE, FERRITIN, TIBC, IRON, RETICCTPCT in the last 72 hours. Sepsis Labs: No results for input(s): PROCALCITON, LATICACIDVEN in the last 168 hours.  Recent Results (from the past 240 hour(s))  Blood culture (routine x 2)     Status: None   Collection Time: 06/26/19  5:33 PM   Specimen: BLOOD  Result Value Ref Range Status   Specimen Description   Final    BLOOD LEFT ANTECUBITAL Performed at [facility] [address] [place], [place] [address]    Special Requests   Final    BOTTLES DRAWN AEROBIC AND ANAEROBIC Blood Culture adequate volume Performed at [facility] [address] [place], [place] [address]    Culture   Final    NO GROWTH 5 DAYS Performed at [facility], [place] [address] [place], [place] [address]    Report Status 07/01/2019 FINAL  Final  Respiratory Panel by RT PCR (Flu A&B, Covid) - Nasopharyngeal Swab     Status: None   Collection Time: 06/26/19  6:07 PM   Specimen: Nasopharyngeal Swab  Result Value Ref Range Status   SARS Coronavirus 2 by RT PCR NEGATIVE NEGATIVE Final    Comment: (NOTE) SARS-CoV-2 target nucleic acids are NOT DETECTED. The SARS-CoV-2 RNA is generally detectable in upper respiratoy specimens during the acute phase of infection. The lowest concentration of SARS-CoV-2 viral copies this assay can detect is 131 copies/mL. A negative result does not preclude SARS-Cov-2 infection and should not be used as the sole basis for treatment or other patient  management decisions. A negative result may occur with  improper specimen collection/handling, submission of specimen other than nasopharyngeal swab, presence of viral mutation(s) within the areas targeted by this assay, and inadequate number of viral copies (<131 copies/mL). A negative result must be combined with clinical observations, patient history, and epidemiological information. The expected result is Negative. Fact Sheet for Patients:  PinkCheek.be Fact Sheet for Healthcare Providers:  GravelBags.it This test is not yet ap proved or cleared by the [place] FDA and  has been authorized for detection and/or diagnosis of SARS-CoV-2 by FDA under an Emergency Use Authorization (EUA). This EUA will remain  in effect (meaning this test can be used) for the duration of the COVID-19 declaration under Section 564(b)(1) of the Act, 21 U.S.C. section 360bbb-3(b)(1), unless the authorization is terminated or revoked sooner.    Influenza A by PCR NEGATIVE NEGATIVE Final   Influenza B by PCR NEGATIVE NEGATIVE Final    Comment: (NOTE) The Xpert Xpress SARS-CoV-2/FLU/RSV assay is intended as an aid in  the diagnosis of influenza from Nasopharyngeal swab specimens and  should not be used as a sole basis for treatment. Nasal washings and  aspirates are unacceptable for Xpert Xpress SARS-CoV-2/FLU/RSV  testing. Fact Sheet for Patients: PinkCheek.be Fact Sheet for Healthcare Providers: GravelBags.it This test is not yet approved or cleared by the [place] FDA and  has been authorized for detection and/or diagnosis of SARS-CoV-2 by  FDA under an Emergency Use Authorization (EUA). This EUA will remain  in effect (meaning this test can be used) for the duration of the  Covid-19 declaration under Section 564(b)(1) of the Act, 21  U.S.C. section 360bbb-3(b)(1), unless the  authorization is  terminated or revoked. Performed at [facility], [place] [address], [place],  [address]  Blood culture (routine x 2)     Status: None   Collection Time: 06/26/19  6:12 PM   Specimen: Site Not Specified; Blood  Result Value Ref Range Status   Specimen Description   Final    SITE NOT SPECIFIED Performed at [facility] [address], [place], [place] [address]    Special Requests   Final    BOTTLES DRAWN AEROBIC AND ANAEROBIC Blood Culture adequate volume Performed at [facility] [address] [place], [place] [address]    Culture   Final    NO GROWTH 5 DAYS Performed at [facility], [place] [address] [place], [place] [address]    Report Status 07/01/2019 FINAL  Final         Radiology Studies: DG Abd Portable 1V  Result Date: 07/04/2019 CLINICAL DATA:  Abdominal distention. Status post lysis of adhesions on 07/01/2019. EXAM: PORTABLE ABDOMEN - 1 VIEW COMPARISON:  06/29/2019 FINDINGS: There has been increased small bowel dilatation in the mid abdomen since the prior study. Colon is not distended but there is new air in the nondistended colon. Interval abdominal surgery for lysis of adhesions. IMPRESSION: Increasing small bowel dilatation since the prior study. This could represent ileus or recurrent small bowel obstruction. Electronically Signed   By: Lorriane Shire M.D.   On: 07/04/2019 12:58   [place] EKG SITE RITE  Result Date: 07/04/2019 If [facility] image not attached, placement could not be confirmed due to current cardiac rhythm.  [place] EKG SITE RITE  Result Date: 07/02/2019 If [facility] image not attached, placement could not be confirmed due to current cardiac rhythm.       Scheduled Meds: . acetaminophen  650 mg Oral Q6H  . Chlorhexidine Gluconate Cloth  6 each Topical Daily  . docusate sodium  100 mg Oral BID  . feeding supplement  1 Container Oral TID BM  . feeding  supplement (PRO-STAT SUGAR FREE 64)  30 mL Oral BID  . insulin aspart  0-15 Units Subcutaneous TID WC  . mouth rinse  15 mL Mouth Rinse BID  . methocarbamol  500 mg Oral Q8H  . polyvinyl alcohol  1 drop Both Eyes TID  . sodium chloride flush  10-40 mL Intracatheter Q12H  . sodium chloride flush  3 mL Intravenous Once  . sodium chloride flush  3 mL Intravenous Q12H   Continuous Infusions: . dextrose 5 % and 0.45% NaCl 100 mL/hr at 07/04/19 1400  . dextrose 5 % and 0.45% NaCl    . potassium PHOSPHATE IVPB (in mmol) 85 mL/hr at 07/04/19 1400  . TPN ADULT (ION)       LOS: 8 days     Georgette Shell, MD 07/04/2019, 3:26 PM

## 2019-07-04 NOTE — Progress Notes (Signed)
Physical Therapy Treatment Patient Details Name: Gloria Murphy MRN: [id number] DOB: [birth year] Today's Date: 07/04/2019    History of Present Illness 84 year old woman admitted from home on 2/17 due to nausea and vomiting with decreased p.o. intake. She has a history of a pelvic carcinoid tumor for which she underwent exploratory laparotomy in April 2019 with bilateral salpingo-oophorectomy and omentectomy as well as partial hysterectomy  In the ED she was found to have a large small bowel obstruction and gastric distention.  NG tube was placed, exploratory laparotomy 2/22.     PT Comments    Pt assisted with getting to [facility] and then over to recliner.  Pt requiring at least mod assist for mobility at this time and has not yet been able to ambulate.  Pt not progressing with mobility as anticipated for d/c home so updated d/c recommendations for SNF.     Follow Up Recommendations  Supervision/Assistance - 24 hour;SNF     Equipment Recommendations  Rolling walker with 5" wheels    Recommendations for Other Services       Precautions / Restrictions Precautions Precautions: Fall Restrictions Weight Bearing Restrictions: No    Mobility  Bed Mobility Overal bed mobility: Needs Assistance Bed Mobility: Rolling;Sidelying to Sit Rolling: Min assist Sidelying to sit: Mod assist;+2 for physical assistance       General bed mobility comments: cues for reaching for bed rail to self assist; allowed pt to initiate and request assist as needed; assist mostly for trunk due to pain  Transfers Overall transfer level: Needs assistance Equipment used: Rolling walker (2 wheeled) Transfers: Sit to/from [facility] Sit to Stand: Mod assist Stand pivot transfers: Min assist       General transfer comment: assist to rise and steady, guidance of hips to [facility] as pt started urinating prior to sitting and rushing; assisted with a few steps from [facility] to recliner however pt felt unable  to ambulate today  Ambulation/Gait                 Stairs             Wheelchair Mobility    Modified Rankin (Stroke Patients Only)       Balance                                            Cognition Arousal/Alertness: Awake/alert Behavior During Therapy: Flat affect Overall Cognitive Status: No family/caregiver present to determine baseline cognitive functioning                                 General Comments: pt very quiet, agreeable to OOB, reports right shoulder pain today      Exercises      General Comments        Pertinent Vitals/Pain Pain Assessment: Faces Faces Pain Scale: Hurts even more Pain Location: right shoulder Pain Descriptors / Indicators: Guarding;Grimacing Pain Intervention(s): Repositioned;Monitored during session;Patient requesting pain meds-RN notified    Home Living                      Prior Function            PT Goals (current goals can now be found in the care plan section) Progress towards PT goals: Progressing toward goals  Frequency    Min 2X/week      PT Plan Discharge plan needs to be updated    Co-evaluation              AM-PAC PT "6 Clicks" Mobility   Outcome Measure  Help needed turning from your back to your side while in a flat bed without using bedrails?: A Lot Help needed moving from lying on your back to sitting on the side of a flat bed without using bedrails?: A Lot Help needed moving to and from a bed to a chair (including a wheelchair)?: A Lot Help needed standing up from a chair using your arms (e.g., wheelchair or bedside chair)?: A Lot Help needed to walk in hospital room?: A Lot Help needed climbing 3-5 steps with a railing? : Total 6 Click Score: 11    End of Session Equipment Utilized During Treatment: Gait belt Activity Tolerance: Patient limited by pain Patient left: in chair;with call bell/phone within reach;with chair alarm  set;with nursing/sitter in room Nurse Communication: Mobility status PT Visit Diagnosis: Other abnormalities of gait and mobility (R26.89)     Time: [id number] PT Time Calculation (min) (ACUTE ONLY): 18 min  Charges:  $Therapeutic Activity: 8-22 mins                     Arlyce Dice, DPT Acute Rehabilitation Services Office: [phone]   York Ram E 07/04/2019, 1:44 PM

## 2019-07-04 NOTE — TOC Progression Note (Signed)
Transition of Care [facility]) - Progression Note    Patient Details  Name: Gloria Murphy MRN: [id number] Date of Birth: Feb 01, 1936  Transition of Care [facility] CM/SW Contact  Aunika Kirsten, Juliann Pulse, RN Phone Number: 07/04/2019, 11:04 AM  Clinical Narrative:  Spouse in agreement to SNF-awaiting PT/OT note to send for SNF.     Expected Discharge Plan: [place] Barriers to Discharge: Continued Medical Work up  Expected Discharge Plan and Services Expected Discharge Plan: [place]   Discharge Planning Services: CM Consult   Living arrangements for the past 2 months: Single Family Home                         Social Determinants of Health (SDOH) Interventions    Readmission Risk Interventions No flowsheet data found.

## 2019-07-04 NOTE — Progress Notes (Signed)
PHARMACY - TOTAL PARENTERAL NUTRITION CONSULT NOTE   Indication: bowel obstruction  Patient Measurements: Height: 5\' 9"  (175.3 cm) Weight: 166 lb 10.7 oz (75.6 kg) IBW/kg (Calculated) : 66.2 TPN AdjBW (KG): 66.1 Body mass index is 24.61 kg/m. Usual Weight:   Assessment: 84 yo woman with hx of pelvic carcinoid tumor, admitted with N/V, decreased po intake was found to have SBO and gastric distention.  Note that below lab assessment is without TPN current ordered Glucose / Insulin: hx of type II diabetes, controlled with diet. CBGs fairly well controlled - range 136-154 with 1 outlier of 199 Electrolytes: Phos 1.6, mag 1.6, K 3.6, and Na 134 - note that mag and phos replacement already ordered by Md this AM Renal: WNL LFTs / TGs: AST/ALT WNL, TBili 1.0, TGs 108 Prealbumin / albumin: 16.9 (2/19), 12.1 (2/23) Intake / Output; MIVF: D5 1/2 NS @ 100 ml/hr GI Imaging: 2/21 X-ray show persistently distended stomach Surgeries / Procedures: 2/22 exploratory laparotomy (n/a) lysis of adhesions  Central access: PICC 2/22, pt pulled PICC out on 2/23 ~ 2100, plan re-insert PICC 2/26 and restart TPN TPN start date: 2/22 -2/23, 2/26 >>   Nutritional Goals RD recs KCal:1700-1900 , Protein:85-100gm , Fluid: </= 2 L/day  Goal TPN rate is 75 mL/hr (provides 90 g of protein and 1818 kcals per day) Protein: 50g/L Lipids 30g/L Dextrose 15%  Current Nutrition:  Full liquid diet, boost ordered tid CL diet  Plan:  Restart TPN at 1800 at rate 40 ml/hr Will increase TPN toward goal rate above as tolerated Electrolytes in TPN: 25mEq/L of Na, 67mEq/L of K, 83mEq/L of Ca, 1mEq/L of Mg, and 37mmol/L of Phos. Cl:Ac ratio 1:1 Add trace elements to TPN daily and standard MVI on MWF only due to national backorder Adjust SSI to with meals,  adjust as needed  Post op surgery MIVF - D51/2NS at 161ml/hr - reduce to 60 ml/hr when TPN starts Monitor TPN labs on Mon/Thurs F/u if tolerated diet/need for  TPN   Adrian Saran, PharmD, BCPS 07/04/2019 11:15 AM

## 2019-07-04 NOTE — Progress Notes (Signed)
Nutrition Follow-up  RD working remotely.   DOCUMENTATION CODES:   Not applicable  INTERVENTION:  - continue Boost Breeze TID, each supplement provides 250 kcal and 9 grams of protein. - will order 30 mL Prostat BID, each supplement provides 100 kcal and 15 grams of protein. - TPN re-start and advancement per Pharmacist.    NUTRITION DIAGNOSIS:   Increased nutrient needs related to acute illness, post-op healing as evidenced by estimated needs. -revised  GOAL:   Patient will meet greater than or equal to 90% of their needs -unmet, unable to meet on CLD  MONITOR:   PO intake, Supplement acceptance, Diet advancement, Labs, Weight trends, Skin  ASSESSMENT:   84 year old female admitted from home on 2/17 due to N/V and decreased p.o. intake. In the ED she was found to have a SBO and gastric distention. She was seen by surgery in the ED and NG tube was placed. She has a history of a pelvic carcinoid tumor for which she underwent ex lap in 08/2017 with bilateral salpingo-oophorectomy and omentectomy as well as partial hysterectomy.  Significant Events: 2/17- admission; NGT placed 2/18- initial RD assessment 2/19- NGT replaced and removed 2/20- NGT replaced x3 and removed x2 2/22- ex lap with LOAs; TPN initiation 2/23- diet advanced from NPO to CLD; NGT removed; patient confused and pulled PICC 2/24- diet advanced from CLD to FLD   No intakes documented since diet advancement began. Diet was downgraded from [place] to CLD today at [place]. Weight is now more consistent with UBW. Estimated nutrition needs updated this AM.   Surgery team not indicates increase in abdominal distention today, no BM since prior to surgery. Diet downgraded, plan to replace PICC, plan to restart TPN.     Labs reviewed; CBG: 152 mg/dl, Na: 134 mmol/l, Ca: 8.3 mg/dl, Phos: 1.6 mg/dl, Mg: 1.6 mg/dl. Medications reviewed; 100 mg colace BID, sliding scale novolog, 2 g IV Mg sulfate x1 run 2/25, 40 mEq Klor-Con x1  dose 2/24 and x1 dose 2/25, 20 mmol IV KPhos x1 run 2/24, 30 mmol IV KPhos x1 run 2/25. IVF; D5-1/2 NS @ 100 ml/hr (408 kcal).    Diet Order:   Diet Order            Diet clear liquid Room service appropriate? Yes; Fluid consistency: Thin  Diet effective now              EDUCATION NEEDS:   No education needs have been identified at this time  Skin:  Skin Assessment: Skin Integrity Issues: Skin Integrity Issues:: Incisions Incisions: abdomen 2/22  Last BM:  PTA  Height:   Ht Readings from Last 1 Encounters:  06/26/19 5\' 9"  (1.753 m)    Weight:   Wt Readings from Last 1 Encounters:  07/04/19 75.6 kg    Ideal Body Weight:  65.9 kg  BMI:  Body mass index is 24.61 kg/m.  Estimated Nutritional Needs:   Kcal:  2000-2200 kcal  Protein:  100-115 grams  Fluid:  >/= 2 L/day     Jarome Matin, MS, RD, LDN, CNSC Inpatient Clinical Dietitian RD pager # available in AMION  After hours/weekend pager # available in [facility]

## 2019-07-04 NOTE — Progress Notes (Signed)
OT Cancellation Note  Patient Details Name: VIKKI BISOGNO MRN: [id number] DOB: 1935/11/21   Cancelled Treatment:    Reason Eval/Treat Not Completed: Other (comment). Attempted to see pt twice today. First attempt, pt had just finished working with PT, Second attempt, pt just returned to bed from sitting up in recliner. Plan to reattempt, likely tomorrow.   Tyrone Schimke, OT Acute Rehabilitation Services Pager: [phone] Office: [phone]  07/04/2019, 1:23 PM

## 2019-07-04 NOTE — Progress Notes (Addendum)
[facility] Progress Note  3 Days Post-Op  Subjective: CC-  Appears comfortable this morning. Denies any abdominal pain but she is more distended. No flatus or BM. Some nausea, no emesis.  Objective: Vital signs in last 24 hours: Temp:  [98 F (36.7 C)-98.4 F (36.9 C)] 98 F (36.7 C) (02/25 0614) Pulse Rate:  [88-94] 89 (02/25 0614) Resp:  [16-24] 24 (02/25 0614) BP: (151-162)/(74-80) 162/79 (02/25 0614) SpO2:  [99 %-100 %] 100 % (02/25 0614) Weight:  [75.6 kg] 75.6 kg (02/25 0300) Last BM Date: (unable to answer)  Intake/Output from previous day: 02/24 0701 - 02/25 0700 In: 2907 [P.O.:800; I.V.:1610.4; IV Piggyback:496.6] Out: 700 [Urine:700] Intake/Output this shift: Total I/O In: 10 [P.O.:10] Out: -   PE: [id number], NAD Cardio: RRR Pulm: CTAB, rate and effort normal Abd: Soft,distended, +BS,mild diffuse tenderness, midline incision cdi with staples intact and honeycomb dressing in place/ trace dried blood on dressing  Lab Results:  Recent Labs    07/02/19 0410 07/03/19 0424  WBC 6.9 6.9  HGB 10.6* 11.2*  HCT 33.8* 34.0*  PLT 158 135*   BMET Recent Labs    07/03/19 0424 07/04/19 0452  NA 136 134*  K 3.2* 3.6  CL 108 105  CO2 20* 20*  GLUCOSE 129* 192*  BUN 10 8  CREATININE 0.54 0.63  CALCIUM 8.5* 8.3*   PT/INR No results for input(s): LABPROT, INR in the last 72 hours. CMP     Component Value Date/Time   NA 134 (L) 07/04/2019 0452   NA 142 02/26/2019 1618   K 3.6 07/04/2019 0452   CL 105 07/04/2019 0452   CO2 20 (L) 07/04/2019 0452   GLUCOSE 192 (H) 07/04/2019 0452   BUN 8 07/04/2019 0452   BUN 19 02/26/2019 1618   CREATININE 0.63 07/04/2019 0452   CALCIUM 8.3 (L) 07/04/2019 0452   PROT 6.3 (L) 07/04/2019 0452   PROT 7.7 02/04/2019 1719   ALBUMIN 2.5 (L) 07/04/2019 0452   ALBUMIN 4.1 02/04/2019 1719   AST 31 07/04/2019 0452   ALT 35 07/04/2019 0452   ALKPHOS 86 07/04/2019 0452   BILITOT 1.0 07/04/2019 0452    BILITOT 0.4 02/04/2019 1719   GFRNONAA >60 07/04/2019 0452   GFRAA >60 07/04/2019 0452   Lipase     Component Value Date/Time   LIPASE 38 06/26/2019 1352       Studies/Results: [place] EKG SITE RITE  Result Date: 07/02/2019 If Site Rite image not attached, placement could not be confirmed due to current cardiac rhythm.   Anti-infectives: Anti-infectives (From admission, onward)   Start     Dose/Rate Route Frequency Ordered Stop   07/01/19 1015  cefoTEtan (CEFOTAN) 2 g in sodium chloride 0.9 % 100 mL IVPB     2 g 200 mL/hr over 30 Minutes Intravenous On call to O.R. 07/01/19 0847 07/01/19 1234   06/27/19 1000  piperacillin-tazobactam (ZOSYN) IVPB 3.375 g  Status:  Discontinued     3.375 g 12.5 mL/hr over 240 Minutes Intravenous Every 8 hours 06/27/19 0807 06/29/19 1119   06/27/19 0200  piperacillin-tazobactam (ZOSYN) IVPB 2.25 g  Status:  Discontinued     2.25 g 100 mL/hr over 30 Minutes Intravenous Every 8 hours 06/26/19 2012 06/27/19 0807   06/26/19 1700  piperacillin-tazobactam (ZOSYN) IVPB 3.375 g     3.375 g 100 mL/hr over 30 Minutes Intravenous  Once 06/26/19 1651 06/26/19 1823       Assessment/Plan DM Lactic acidosis - resolved AKI -  improved, Cr0.63 Hypokalemia Protein calorie Malnutrition - prealbumin 12.1 (2/23) H/o Carcinoid tumor s/p ex lapwith bilateral salpingo-oophorectomy, omentectomy and radical debulkingfor suspected ovarian cancer 08/31/2017 Dr. Denman George  SBO S/pEXPLORATORY LAPAROTOMY (N/A)LYSIS OF ADHESIONS2/22 Dr. Marlou Starks - POD#3 - Mobilize, continue PT/OT - Patient is more distended today and has not had a bowel movement yet since surgery. She does have good bowel sounds. Back diet down to clears. Check abdominal film. Continue mobilizing. Replace PICC and restart TPN.  ID -zosyn 2/17>>2/20, cefotetan periop x1 FEN - IVF, CLD, Boost, TPN VTE -SCDs, ok for chemical DVT prophylaxis from surgical standpoint Foley -d/c 2/23 Follow up -Dr.  Marlou Starks   LOS: 8 days    Wellington Hampshire, [facility] 07/04/2019, 9:30 AM Please see Amion for pager number during day hours 7:00am-4:30pm

## 2019-07-04 NOTE — Care Management Important Message (Signed)
Important Message  Patient Details IM Letter given to Dessa Phi RN Case Manager to present to the Patient Name: Gloria Murphy MRN: [id number] Date of Birth: March 05, 1936   Medicare Important Message Given:  Yes     Kerin Salen 07/04/2019, 1:57 PM

## 2019-07-04 NOTE — Progress Notes (Signed)
Peripherally Inserted Central Catheter/Midline Placement  The IV Nurse has discussed with the patient and/or persons authorized to consent for the patient, the purpose of this procedure and the potential benefits and risks involved with this procedure.  The benefits include less needle sticks, lab draws from the catheter, and the patient may be discharged home with the catheter. Risks include, but not limited to, infection, bleeding, blood clot (thrombus formation), and puncture of an artery; nerve damage and irregular heartbeat and possibility to perform a PICC exchange if needed/ordered by physician.  Alternatives to this procedure were also discussed.  Bard Power PICC patient education guide, fact sheet on infection prevention and patient information card has been provided to patient /or left at bedside.    PICC/Midline Placement Documentation  PICC Double Lumen 07/04/19 PICC Right Brachial 34 cm 0 cm (Active)  Indication for Insertion or Continuance of Line Administration of hyperosmolar/irritating solutions (i.e. TPN, Vancomycin, etc.) 07/04/19 1451  Exposed Catheter (cm) 0 cm 07/04/19 1451  Site Assessment Clean;Dry;Intact 07/04/19 1451  Lumen #1 Status Flushed;Saline locked;Blood return noted 07/04/19 1451  Lumen #2 Status Flushed;Saline locked;Blood return noted 07/04/19 1451  Dressing Type Transparent 07/04/19 1451  Dressing Status Clean;Dry;Intact;Antimicrobial disc in place 07/04/19 1451  Dressing Change Due 07/11/19 07/04/19 1451       Gordan Payment 07/04/2019, 2:52 PM

## 2019-07-04 NOTE — Progress Notes (Signed)
RN notified of bigeminal PVCs. Now in sinus rhythm. Asymptomatic. MD made aware.

## 2019-07-05 ENCOUNTER — Inpatient Hospital Stay (HOSPITAL_COMMUNITY): Payer: Medicare Other

## 2019-07-05 LAB — BLOOD GAS, ARTERIAL
Acid-base deficit: 2.2 mmol/L — ABNORMAL HIGH (ref 0.0–2.0)
Bicarbonate: 18.7 mmol/L — ABNORMAL LOW (ref 20.0–28.0)
FIO2: 21
O2 Saturation: 94.4 %
Patient temperature: 98.4
pCO2 arterial: 24.9 mmHg — ABNORMAL LOW (ref 32.0–48.0)
pH, Arterial: 7.488 — ABNORMAL HIGH (ref 7.350–7.450)
pO2, Arterial: 69.8 mmHg — ABNORMAL LOW (ref 83.0–108.0)

## 2019-07-05 LAB — COMPREHENSIVE METABOLIC PANEL
ALT: 56 U/L — ABNORMAL HIGH (ref 0–44)
ALT: 48 U/L — ABNORMAL HIGH (ref 0–44)
AST: 55 U/L — ABNORMAL HIGH (ref 15–41)
AST: 38 U/L (ref 15–41)
Albumin: 2.5 g/dL — ABNORMAL LOW (ref 3.5–5.0)
Albumin: 2.4 g/dL — ABNORMAL LOW (ref 3.5–5.0)
Alkaline Phosphatase: 91 U/L (ref 38–126)
Alkaline Phosphatase: 84 U/L (ref 38–126)
Anion gap: 11 (ref 5–15)
Anion gap: 11 (ref 5–15)
BUN: 13 mg/dL (ref 8–23)
BUN: 18 mg/dL (ref 8–23)
CO2: 21 mmol/L — ABNORMAL LOW (ref 22–32)
CO2: 18 mmol/L — ABNORMAL LOW (ref 22–32)
Calcium: 8.5 mg/dL — ABNORMAL LOW (ref 8.9–10.3)
Calcium: 8.4 mg/dL — ABNORMAL LOW (ref 8.9–10.3)
Chloride: 101 mmol/L (ref 98–111)
Chloride: 99 mmol/L (ref 98–111)
Creatinine, Ser: 0.68 mg/dL (ref 0.44–1.00)
Creatinine, Ser: 0.82 mg/dL (ref 0.44–1.00)
GFR calc Af Amer: >60 mL/min (ref >60)
GFR calc Af Amer: >60 mL/min (ref >60)
GFR calc non Af Amer: >60 mL/min (ref >60)
GFR calc non Af Amer: >60 mL/min (ref >60)
Glucose, Bld: 305 mg/dL — ABNORMAL HIGH (ref 70–99)
Glucose, Bld: 323 mg/dL — ABNORMAL HIGH (ref 70–99)
Potassium: 3.7 mmol/L (ref 3.5–5.1)
Potassium: 5.5 mmol/L — ABNORMAL HIGH (ref 3.5–5.1)
Sodium: 133 mmol/L — ABNORMAL LOW (ref 135–145)
Sodium: 128 mmol/L — ABNORMAL LOW (ref 135–145)
Total Bilirubin: 1.1 mg/dL (ref 0.3–1.2)
Total Bilirubin: 1.2 mg/dL (ref 0.3–1.2)
Total Protein: 6.6 g/dL (ref 6.5–8.1)
Total Protein: 6.4 g/dL — ABNORMAL LOW (ref 6.5–8.1)

## 2019-07-05 LAB — TYPE AND SCREEN
ABO/RH(D): B POS
Antibody Screen: NEGATIVE
Unit division: 0
Unit division: 0

## 2019-07-05 LAB — CBC
HCT: 36.5 % (ref 36.0–46.0)
Hemoglobin: 11.8 g/dL — ABNORMAL LOW (ref 12.0–15.0)
MCH: 26.3 pg (ref 26.0–34.0)
MCHC: 32.3 g/dL (ref 30.0–36.0)
MCV: 81.5 fL (ref 80.0–100.0)
Platelets: 158 10*3/uL (ref 150–400)
RBC: 4.48 MIL/uL (ref 3.87–5.11)
RDW: 17.2 % — ABNORMAL HIGH (ref 11.5–15.5)
WBC: 11.5 10*3/uL — ABNORMAL HIGH (ref 4.0–10.5)
nRBC: 0 % (ref 0.0–0.2)

## 2019-07-05 LAB — LACTIC ACID, PLASMA
Lactic Acid, Venous: 2.7 mmol/L (ref 0.5–1.9)
Lactic Acid, Venous: 2.7 mmol/L (ref 0.5–1.9)

## 2019-07-05 LAB — GLUCOSE, CAPILLARY
Glucose-Capillary: 275 mg/dL — ABNORMAL HIGH (ref 70–99)
Glucose-Capillary: 295 mg/dL — ABNORMAL HIGH (ref 70–99)
Glucose-Capillary: 290 mg/dL — ABNORMAL HIGH (ref 70–99)
Glucose-Capillary: 252 mg/dL — ABNORMAL HIGH (ref 70–99)
Glucose-Capillary: 229 mg/dL — ABNORMAL HIGH (ref 70–99)

## 2019-07-05 LAB — BPAM RBC
Blood Product Expiration Date: 202103222359
Blood Product Expiration Date: 202103222359
Unit Type and Rh: 7300
Unit Type and Rh: 7300

## 2019-07-05 LAB — MRSA PCR SCREENING: MRSA by PCR: NEGATIVE

## 2019-07-05 LAB — MAGNESIUM: Magnesium: 1.7 mg/dL (ref 1.7–2.4)

## 2019-07-05 LAB — PROCALCITONIN: Procalcitonin: 0.73 ng/mL

## 2019-07-05 LAB — PHOSPHORUS: Phosphorus: 2.1 mg/dL — ABNORMAL LOW (ref 2.5–4.6)

## 2019-07-05 MED ORDER — METOPROLOL TARTRATE 5 MG/5ML IV SOLN
5.0000 mg | Freq: Four times a day (QID) | INTRAVENOUS | Status: DC
  Administered 2019-07-05 – 2019-07-07 (×7): 5 mg via INTRAVENOUS
  Filled 2019-07-05 (×8): qty 5

## 2019-07-05 MED ORDER — HYDROMORPHONE HCL 1 MG/ML IJ SOLN
0.2500 mg | Freq: Once | INTRAMUSCULAR | Status: AC
  Administered 2019-07-05: 0.25 mg via INTRAVENOUS
  Filled 2019-07-05: qty 0.5

## 2019-07-05 MED ORDER — MAGNESIUM SULFATE 2 GM/50ML IV SOLN
2.0000 g | Freq: Once | INTRAVENOUS | Status: AC
  Administered 2019-07-05: 10:00:00 2 g via INTRAVENOUS
  Filled 2019-07-05: qty 50

## 2019-07-05 MED ORDER — SODIUM CHLORIDE 0.9 % IV SOLN
3.0000 g | Freq: Four times a day (QID) | INTRAVENOUS | Status: DC
  Administered 2019-07-05 – 2019-07-10 (×18): 3 g via INTRAVENOUS
  Filled 2019-07-05 (×3): qty 3
  Filled 2019-07-05 (×2): qty 8
  Filled 2019-07-05: qty 3
  Filled 2019-07-05: qty 8
  Filled 2019-07-05: qty 3
  Filled 2019-07-05: qty 8
  Filled 2019-07-05: qty 3
  Filled 2019-07-05: qty 8
  Filled 2019-07-05 (×2): qty 3
  Filled 2019-07-05 (×4): qty 8
  Filled 2019-07-05: qty 3
  Filled 2019-07-05: qty 8
  Filled 2019-07-05: qty 3
  Filled 2019-07-05: qty 8

## 2019-07-05 MED ORDER — INSULIN ASPART 100 UNIT/ML ~~LOC~~ SOLN
0.0000 [IU] | SUBCUTANEOUS | Status: DC
  Administered 2019-07-05 (×2): 8 [IU] via SUBCUTANEOUS
  Administered 2019-07-05: 5 [IU] via SUBCUTANEOUS
  Administered 2019-07-05: 8 [IU] via SUBCUTANEOUS
  Administered 2019-07-06 (×2): 2 [IU] via SUBCUTANEOUS
  Administered 2019-07-06: 05:00:00 3 [IU] via SUBCUTANEOUS
  Administered 2019-07-06 – 2019-07-10 (×5): 2 [IU] via SUBCUTANEOUS
  Administered 2019-07-12: 3 [IU] via SUBCUTANEOUS
  Administered 2019-07-14: 2 [IU] via SUBCUTANEOUS
  Administered 2019-07-14: 3 [IU] via SUBCUTANEOUS
  Administered 2019-07-14 – 2019-07-15 (×4): 2 [IU] via SUBCUTANEOUS
  Administered 2019-07-15: 3 [IU] via SUBCUTANEOUS
  Administered 2019-07-15 – 2019-07-16 (×4): 2 [IU] via SUBCUTANEOUS
  Administered 2019-07-16: 3 [IU] via SUBCUTANEOUS
  Administered 2019-07-16: 5 [IU] via SUBCUTANEOUS
  Administered 2019-07-16: 23:00:00 2 [IU] via SUBCUTANEOUS
  Administered 2019-07-16 – 2019-07-17 (×2): 3 [IU] via SUBCUTANEOUS

## 2019-07-05 MED ORDER — PANTOPRAZOLE SODIUM 40 MG IV SOLR
40.0000 mg | INTRAVENOUS | Status: DC
  Administered 2019-07-05 – 2019-07-12 (×8): 40 mg via INTRAVENOUS
  Filled 2019-07-05 (×8): qty 40

## 2019-07-05 MED ORDER — CHLORHEXIDINE GLUCONATE 0.12 % MT SOLN
15.0000 mL | Freq: Two times a day (BID) | OROMUCOSAL | Status: DC
  Administered 2019-07-05 – 2019-07-23 (×35): 15 mL via OROMUCOSAL
  Filled 2019-07-05 (×33): qty 15

## 2019-07-05 MED ORDER — SCOPOLAMINE 1 MG/3DAYS TD PT72
1.0000 | MEDICATED_PATCH | TRANSDERMAL | Status: DC
  Administered 2019-07-05 – 2019-07-11 (×3): 1.5 mg via TRANSDERMAL
  Filled 2019-07-05 (×5): qty 1

## 2019-07-05 MED ORDER — TRAVASOL 10 % IV SOLN
INTRAVENOUS | Status: AC
  Filled 2019-07-05: qty 480

## 2019-07-05 MED ORDER — POTASSIUM PHOSPHATES 15 MMOLE/5ML IV SOLN
21.0000 mmol | Freq: Once | INTRAVENOUS | Status: AC
  Administered 2019-07-05: 15:00:00 21 mmol via INTRAVENOUS
  Filled 2019-07-05: qty 7

## 2019-07-05 MED ORDER — METOCLOPRAMIDE HCL 5 MG/ML IJ SOLN
5.0000 mg | Freq: Three times a day (TID) | INTRAMUSCULAR | Status: DC
  Administered 2019-07-05 – 2019-07-23 (×53): 5 mg via INTRAVENOUS
  Filled 2019-07-05 (×54): qty 2

## 2019-07-05 MED ORDER — METOPROLOL TARTRATE 5 MG/5ML IV SOLN
5.0000 mg | Freq: Once | INTRAVENOUS | Status: AC
  Administered 2019-07-05: 10:00:00 5 mg via INTRAVENOUS
  Filled 2019-07-05: qty 5

## 2019-07-05 MED ORDER — ORAL CARE MOUTH RINSE
15.0000 mL | Freq: Two times a day (BID) | OROMUCOSAL | Status: DC
  Administered 2019-07-06 – 2019-07-23 (×31): 15 mL via OROMUCOSAL

## 2019-07-05 NOTE — Progress Notes (Signed)
PROGRESS NOTE    Gloria Murphy  [id number] DOB: 1935-12-11 DOA: 06/26/2019 PCP: Carlyle Basques, MD   Brief Narrative: 84 year old woman admitted from home on 2/17 due to nausea and vomiting with decreased p.o. intake. In the ED she was found to have a large small bowel obstruction and gastric distention. She was seen by surgery in the ED and NG tube was placed. She has a history of a pelvic carcinoid tumor for which she underwent exploratory laparotomy in April 2019 with bilateral salpingo-oophorectomy and omentectomy as well as partial hysterectomy. S/p OR on 2/22  Assessment & Plan:   Principal Problem:   SBO (small bowel obstruction) (HCC) Active Problems:   Malignant carcinoid tumor of unknown primary site [facility])   Type 2 diabetes mellitus without complication, without long-term current use of insulin (HCC)   AKI (acute kidney injury) (HCC)   Nausea and vomiting   Abdominal pain   Lactic acidosis   Elevated troponin   Sinus tachycardia  #1 small bowel obstruction status post exploratory laparotomy with lysis of adhesions 07/01/2019.  NG tube was DC'd 07/03/2019 was started on soft diet but patient started to have nausea with abdominal distention.  KUB show ileus versus recurrent obstruction. PICC placed again TPN started.  Patient kept n.p.o. placed NG tube back Seen by PT recommends home health PT and rolling walker with 5 inch wheels. Husband wants to take her home with home health.  #2 history of pelvic carcinoid tumor followed by Dr. Denman George.  #3 AKI resolved likely secondary to intravascular volume depletion improved with IV fluids.  #4 type 2 diabetes not on any medications at home  #5 hypokalemia potassium 3.7 mag 1.7 will replete both.    Nutrition Problem: Increased nutrient needs Etiology: acute illness, post-op healing     Signs/Symptoms: estimated needs    Interventions: Boost Breeze, Prostat, MVI  Estimated body mass index is 24.61 kg/m as  calculated from the following:   Height as of this encounter: 5\' 9"  (1.753 m).   Weight as of this encounter: 75.6 kg.  DVT prophylaxis:Lovenox Code Status:Full code Family Communication:None Disposition Plan:Patient came from home plan is to discharge her home once her bowel functions return. She is admitted with small bowel obstruction status post lysis of adhesions.. She has recurrent ileus or recurrent small bowel obstruction by KUB.  PICC line placed for TPN Surgery following not ready for discharge yet   Consultants:  General surgery  Procedures:NG tube placed 06/26/2019 exploratory laparotomy with lysis of adhesions on 07/01/2019 Antimicrobials:None    Subjective: Patient throwing up this morning coffee-ground emesis Abdomen distended and tender Chest x-ray this morning no active disease Patient was hypertensive tachycardic and tachypneic with vomiting  Objective: Vitals:   07/05/19 0705 07/05/19 0751 07/05/19 1045 07/05/19 1337  BP: 140/78 (!) 142/88 139/84 (!) 149/74  Pulse: (!) 110 (!) 133 (!) 104 (!) 132  Resp: 20 (!) 44 (!) 40 (!) 42  Temp: 97.9 F (36.6 C)  98.5 F (36.9 C) 99.3 F (37.4 C)  TempSrc: Oral  Axillary Axillary  SpO2: 100% 100% 100% 100%  Weight:      Height:        Intake/Output Summary (Last 24 hours) at 07/05/2019 1405 Last data filed at 07/05/2019 0847 Gross per 24 hour  Intake 703.31 ml  Output 402 ml  Net 301.31 ml   Filed Weights   06/26/19 2057 07/02/19 0534 07/04/19 0300  Weight: 66.1 kg 77 kg 75.6 kg    Examination:  General exam: Appears calm and comfortable  Respiratory system: Clear to auscultation. Respiratory effort normal. Cardiovascular system: S1 & S2 heard, RRR. No JVD, murmurs, rubs, gallops or clicks. No pedal edema. Gastrointestinal system: Abdomen is distended, soft and tender. No organomegaly or masses felt. Normal bowel sounds heard. Central nervous system: Awake answers some of the questions  appropriately  Extremities: 1+ pitting edema Skin: No rashes, lesions or ulcers Psychiatry: Judgement and insight appear normal. Mood & affect appropriate.     Data Reviewed: I have personally reviewed following labs and imaging studies  CBC: Recent Labs  Lab 06/29/19 0546 06/30/19 0454 07/01/19 1146 07/02/19 0410 07/03/19 0424  WBC 10.7* 8.0  --  6.9 6.9  NEUTROABS  --   --   --  5.4  --   HGB 12.5 10.8* 11.6* 10.6* 11.2*  HCT 40.8 34.6* 34.0* 33.8* 34.0*  MCV 83.3 83.2  --  82.2 80.0  PLT 188 167  --  158 [id number]*   Basic Metabolic Panel: Recent Labs  Lab 06/29/19 0546 06/30/19 0454 07/01/19 0438 07/01/19 0438 07/01/19 1146 07/02/19 0410 07/03/19 0424 07/04/19 0452 07/05/19 0348  NA 149*   < > 141   < > 140 138 136 134* 133*  K 2.5*   < > 2.8*   < > 3.1* 3.7 3.2* 3.6 3.7  CL 95*   < > 103   < > 101 105 108 105 101  CO2 36*   < > 28  --   --  25 20* 20* 21*  GLUCOSE 132*   < > 157*   < > 174* 292* 129* 192* 305*  BUN 45*   < > 17   < > 12 16 10 8 13   CREATININE 1.34*   < > 0.72   < > 0.70 0.71 0.54 0.63 0.68  CALCIUM 9.4   < > 8.7*  --   --  8.6* 8.5* 8.3* 8.5*  MG 2.5*  --   --   --   --  1.7 1.7 1.6* 1.7  PHOS  --   --   --   --   --  1.8* 2.0* 1.6* 2.1*   < > = values in this interval not displayed.   GFR: Estimated Creatinine Clearance: 57.6 mL/min (by C-G formula based on SCr of 0.68 mg/dL). Liver Function Tests: Recent Labs  Lab 07/02/19 0410 07/04/19 0452 07/05/19 0348  AST 38 31 55*  ALT 31 35 56*  ALKPHOS 64 86 91  BILITOT 1.3* 1.0 1.1  PROT 6.2* 6.3* 6.6  ALBUMIN 2.5* 2.5* 2.5*   No results for input(s): LIPASE, AMYLASE in the last 168 hours. No results for input(s): AMMONIA in the last 168 hours. Coagulation Profile: No results for input(s): INR, PROTIME in the last 168 hours. Cardiac Enzymes: No results for input(s): CKTOTAL, CKMB, CKMBINDEX, TROPONINI in the last 168 hours. BNP (last 3 results) No results for input(s): PROBNP in the last  8760 hours. HbA1C: No results for input(s): HGBA1C in the last 72 hours. CBG: Recent Labs  Lab 07/04/19 2007 07/04/19 2332 07/05/19 0404 07/05/19 0752 07/05/19 1144  GLUCAP 178* 207* 275* 295* 290*   Lipid Profile: No results for input(s): CHOL, HDL, LDLCALC, TRIG, CHOLHDL, LDLDIRECT in the last 72 hours. Thyroid Function Tests: No results for input(s): TSH, T4TOTAL, FREET4, T3FREE, THYROIDAB in the last 72 hours. Anemia Panel: No results for input(s): VITAMINB12, FOLATE, FERRITIN, TIBC, IRON, RETICCTPCT in the last 72 hours. Sepsis Labs: No results  for input(s): PROCALCITON, LATICACIDVEN in the last 168 hours.  Recent Results (from the past 240 hour(s))  Blood culture (routine x 2)     Status: None   Collection Time: 06/26/19  5:33 PM   Specimen: BLOOD  Result Value Ref Range Status   Specimen Description   Final    BLOOD LEFT ANTECUBITAL Performed at [facility] [address], [place], [place] [address]    Special Requests   Final    BOTTLES DRAWN AEROBIC AND ANAEROBIC Blood Culture adequate volume Performed at [facility] [address], [place], [place] [address]    Culture   Final    NO GROWTH 5 DAYS Performed at [facility], [place] [address], [place], [place] [address]    Report Status 07/01/2019 FINAL  Final  Respiratory Panel by RT PCR (Flu A&B, Covid) - Nasopharyngeal Swab     Status: None   Collection Time: 06/26/19  6:07 PM   Specimen: Nasopharyngeal Swab  Result Value Ref Range Status   SARS Coronavirus 2 by RT PCR NEGATIVE NEGATIVE Final    Comment: (NOTE) SARS-CoV-2 target nucleic acids are NOT DETECTED. The SARS-CoV-2 RNA is generally detectable in upper respiratoy specimens during the acute phase of infection. The lowest concentration of SARS-CoV-2 viral copies this assay can detect is 131 copies/mL. A negative result does not preclude SARS-Cov-2 infection and should not be used as the sole basis for  treatment or other patient management decisions. A negative result may occur with  improper specimen collection/handling, submission of specimen other than nasopharyngeal swab, presence of viral mutation(s) within the areas targeted by this assay, and inadequate number of viral copies (<131 copies/mL). A negative result must be combined with clinical observations, patient history, and epidemiological information. The expected result is Negative. Fact Sheet for Patients:  PinkCheek.be Fact Sheet for Healthcare Providers:  GravelBags.it This test is not yet ap proved or cleared by the [place] FDA and  has been authorized for detection and/or diagnosis of SARS-CoV-2 by FDA under an Emergency Use Authorization (EUA). This EUA will remain  in effect (meaning this test can be used) for the duration of the COVID-19 declaration under Section 564(b)(1) of the Act, 21 U.S.C. section 360bbb-3(b)(1), unless the authorization is terminated or revoked sooner.    Influenza A by PCR NEGATIVE NEGATIVE Final   Influenza B by PCR NEGATIVE NEGATIVE Final    Comment: (NOTE) The Xpert Xpress SARS-CoV-2/FLU/RSV assay is intended as an aid in  the diagnosis of influenza from Nasopharyngeal swab specimens and  should not be used as a sole basis for treatment. Nasal washings and  aspirates are unacceptable for Xpert Xpress SARS-CoV-2/FLU/RSV  testing. Fact Sheet for Patients: PinkCheek.be Fact Sheet for Healthcare Providers: GravelBags.it This test is not yet approved or cleared by the [place] FDA and  has been authorized for detection and/or diagnosis of SARS-CoV-2 by  FDA under an Emergency Use Authorization (EUA). This EUA will remain  in effect (meaning this test can be used) for the duration of the  Covid-19 declaration under Section 564(b)(1) of the Act, 21  U.S.C. section  360bbb-3(b)(1), unless the authorization is  terminated or revoked. Performed at [facility], [place] [address] [place], [place] [address]   Blood culture (routine x 2)     Status: None   Collection Time: 06/26/19  6:12 PM   Specimen: Site Not Specified; Blood  Result Value Ref Range Status   Specimen  Description   Final    SITE NOT SPECIFIED Performed at [facility] [address], [place], [place] [address]    Special Requests   Final    BOTTLES DRAWN AEROBIC AND ANAEROBIC Blood Culture adequate volume Performed at [facility] [address] [place], [place] [address]    Culture   Final    NO GROWTH 5 DAYS Performed at [facility], [place] [address] [place], [place] [address]    Report Status 07/01/2019 FINAL  Final         Radiology Studies: DG Chest 1 View  Result Date: 07/05/2019 CLINICAL DATA:  Tachypnea. EXAM: CHEST  1 VIEW COMPARISON:  June 26, 2019. FINDINGS: The heart size and mediastinal contours are within normal limits. Both lungs are clear. Right-sided PICC line is noted with distal tip in expected position of the SVC. No pneumothorax or pleural effusion is noted. The visualized skeletal structures are unremarkable. IMPRESSION: No active disease. Electronically Signed   By: Marijo Conception M.D.   On: 07/05/2019 09:11   DG Abd 1 View  Result Date: 07/05/2019 CLINICAL DATA:  Nasogastric tube placement. EXAM: ABDOMEN - 1 VIEW COMPARISON:  None. FINDINGS: The visualized bowel gas pattern is normal. Nasogastric tube is not included in field-of-view. No radio-opaque calculi or other significant radiographic abnormality are seen. IMPRESSION: Nasogastric tube is not included in field-of-view. Electronically Signed   By: Marijo Conception M.D.   On: 07/05/2019 09:12   DG Abd Portable 1V  Result Date: 07/04/2019 CLINICAL DATA:  Abdominal distention. Status post lysis of adhesions on 07/01/2019. EXAM:  PORTABLE ABDOMEN - 1 VIEW COMPARISON:  06/29/2019 FINDINGS: There has been increased small bowel dilatation in the mid abdomen since the prior study. Colon is not distended but there is new air in the nondistended colon. Interval abdominal surgery for lysis of adhesions. IMPRESSION: Increasing small bowel dilatation since the prior study. This could represent ileus or recurrent small bowel obstruction. Electronically Signed   By: Lorriane Shire M.D.   On: 07/04/2019 12:58   [place] EKG SITE RITE  Result Date: 07/04/2019 If [facility] image not attached, placement could not be confirmed due to current cardiac rhythm.       Scheduled Meds: . acetaminophen  650 mg Oral Q6H  . Chlorhexidine Gluconate Cloth  6 each Topical Daily  . docusate sodium  100 mg Oral BID  . feeding supplement  1 Container Oral TID BM  . feeding supplement (PRO-STAT SUGAR FREE 64)  30 mL Oral BID  . insulin aspart  0-15 Units Subcutaneous Q4H  . mouth rinse  15 mL Mouth Rinse BID  . methocarbamol  500 mg Oral Q8H  . metoCLOPramide (REGLAN) injection  5 mg Intravenous Q8H  . pantoprazole (PROTONIX) IV  40 mg Intravenous Q24H  . polyvinyl alcohol  1 drop Both Eyes TID  . sodium chloride flush  10-40 mL Intracatheter Q12H  . sodium chloride flush  3 mL Intravenous Once  . sodium chloride flush  3 mL Intravenous Q12H   Continuous Infusions: . dextrose 5 % and 0.45% NaCl 60 mL/hr at 07/04/19 2137  . potassium PHOSPHATE IVPB (in mmol)    . TPN ADULT (ION) 40 mL/hr at 07/04/19 1742  . TPN ADULT (ION)       LOS: 9 days     Georgette Shell, MD  07/05/2019, 2:05 PM

## 2019-07-05 NOTE — Progress Notes (Signed)
Patient projectile vomited at 730am, a large amount of coffee ground emesis. Patient proceeded to vomit twice more, medium amounts of coffee ground emesis, over the next several hours.  At beginning of shift 7a, patient's HR was elevated in the 130s-150s.  Per night shift RN and flowsheet documentation, HR was up throughout the night.  Patient's respirations were also elevated in the 30s/40s. Dr. Rodena Piety on the floor rounding, new orders placed.  Pt's HR was responsive to metoprolol, down to 105. Margie Billet, [place] with general surgery also rounding to see patient.  Patient's abdomen distended, taut, and tender. Several attempts were made to replace patient's NGT per surgery orders but unsuccessful.  Order was placed for NGT to be placed by radiology around 930am.  Patient to radiology at 1330.  When patient returned, HR and RR were still elevated.  Patient NGT immediately put out 1345ml dark, brown, thick liquid with sediment to suction.  Dr. Rodena Piety paged at approximately 1450 with concern for patient's HR/RR, Margie Billet PA paged with concern for continuously clogging NGT.  RRRN and AC to bedside to assess patient for transfer.  At this time patient also had a temp of 100.3 F axillary with obvious chills.  Patient moved to stepdown unit for higher level of care.  Report given to [facility], ICU RN.  Husband aware; has been at bedside throughout the day and currently in ICU waiting room, aware of patient's status.

## 2019-07-05 NOTE — Progress Notes (Signed)
OT Cancellation Note  Patient Details Name: Gloria Murphy MRN: [id number] DOB: 12-01-1935   Cancelled Treatment:    Reason Eval/Treat Not Completed: Medical issues which prohibited therapy. Pt with increased RR. Nsg asking to hold today.   Ramond Dial, OT/L   Acute OT Clinical Specialist Acute Rehabilitation Services Pager [phone] Office [phone]  07/05/2019, 1:11 PM

## 2019-07-05 NOTE — Progress Notes (Addendum)
[facility] Progress Note  4 Days Post-Op  Subjective: CC-  Patient nauseated and vomiting this morning. She is bloated. Thinks she may have passed some flatus this morning. Large loose/liquid BM last night.   Objective: Vital signs in last 24 hours: Temp:  [97.4 F (36.3 C)-98.2 F (36.8 C)] 97.9 F (36.6 C) (02/26 0705) Pulse Rate:  [72-133] 133 (02/26 0751) Resp:  [16-44] 44 (02/26 0751) BP: (140-179)/(72-92) 142/88 (02/26 0751) SpO2:  [100 %] 100 % (02/26 0751) Last BM Date: (uta)  Intake/Output from previous day: 02/25 0701 - 02/26 0700 In: 2429.2 [P.O.:380; I.V.:1482.3; IV Piggyback:566.9] Out: 302 [Urine:300; Emesis/NG output:2] Intake/Output this shift: Total I/O In: 0  Out: 300 [Urine:300]  PE: [id number], uncomfortable Cardio: tachy Pulm: tachypneic Abd: Soft,distended,hypoactive BS,mild diffuse tenderness without peritonitis, midline incision cdi with staples intact and no erythema or drainage  Lab Results:  Recent Labs    07/03/19 0424  WBC 6.9  HGB 11.2*  HCT 34.0*  PLT 135*   BMET Recent Labs    07/04/19 0452 07/05/19 0348  NA 134* 133*  K 3.6 3.7  CL 105 101  CO2 20* 21*  GLUCOSE 192* 305*  BUN 8 13  CREATININE 0.63 0.68  CALCIUM 8.3* 8.5*   PT/INR No results for input(s): LABPROT, INR in the last 72 hours. CMP     Component Value Date/Time   NA 133 (L) 07/05/2019 0348   NA 142 02/26/2019 1618   K 3.7 07/05/2019 0348   CL 101 07/05/2019 0348   CO2 21 (L) 07/05/2019 0348   GLUCOSE 305 (H) 07/05/2019 0348   BUN 13 07/05/2019 0348   BUN 19 02/26/2019 1618   CREATININE 0.68 07/05/2019 0348   CALCIUM 8.5 (L) 07/05/2019 0348   PROT 6.6 07/05/2019 0348   PROT 7.7 02/04/2019 1719   ALBUMIN 2.5 (L) 07/05/2019 0348   ALBUMIN 4.1 02/04/2019 1719   AST 55 (H) 07/05/2019 0348   ALT 56 (H) 07/05/2019 0348   ALKPHOS 91 07/05/2019 0348   BILITOT 1.1 07/05/2019 0348   BILITOT 0.4 02/04/2019 1719   GFRNONAA >60  07/05/2019 0348   GFRAA >60 07/05/2019 0348   Lipase     Component Value Date/Time   LIPASE 38 06/26/2019 1352       Studies/Results: DG Abd Portable 1V  Result Date: 07/04/2019 CLINICAL DATA:  Abdominal distention. Status post lysis of adhesions on 07/01/2019. EXAM: PORTABLE ABDOMEN - 1 VIEW COMPARISON:  06/29/2019 FINDINGS: There has been increased small bowel dilatation in the mid abdomen since the prior study. Colon is not distended but there is new air in the nondistended colon. Interval abdominal surgery for lysis of adhesions. IMPRESSION: Increasing small bowel dilatation since the prior study. This could represent ileus or recurrent small bowel obstruction. Electronically Signed   By: Lorriane Shire M.D.   On: 07/04/2019 12:58   [place] EKG SITE RITE  Result Date: 07/04/2019 If [facility] image not attached, placement could not be confirmed due to current cardiac rhythm.   Anti-infectives: Anti-infectives (From admission, onward)   Start     Dose/Rate Route Frequency Ordered Stop   07/01/19 1015  cefoTEtan (CEFOTAN) 2 g in sodium chloride 0.9 % 100 mL IVPB     2 g 200 mL/hr over 30 Minutes Intravenous On call to O.R. 07/01/19 0847 07/01/19 1234   06/27/19 1000  piperacillin-tazobactam (ZOSYN) IVPB 3.375 g  Status:  Discontinued     3.375 g 12.5 mL/hr over 240 Minutes Intravenous Every  8 hours 06/27/19 0807 06/29/19 1119   06/27/19 0200  piperacillin-tazobactam (ZOSYN) IVPB 2.25 g  Status:  Discontinued     2.25 g 100 mL/hr over 30 Minutes Intravenous Every 8 hours 06/26/19 2012 06/27/19 0807   06/26/19 1700  piperacillin-tazobactam (ZOSYN) IVPB 3.375 g     3.375 g 100 mL/hr over 30 Minutes Intravenous  Once 06/26/19 1651 06/26/19 1823       Assessment/Plan DM Lactic acidosis - resolved AKI - improved, Cr0.68 Hypokalemia Protein calorie Malnutrition - prealbumin 12.1 (2/23) H/o Carcinoid tumor s/p ex lapwith bilateral salpingo-oophorectomy, omentectomy and radical  debulkingfor suspected ovarian cancer 08/31/2017 Dr. Denman George  SBO S/pEXPLORATORY LAPAROTOMY (N/A)LYSIS OF ADHESIONS2/22 Dr. Marlou Starks - POD#4 - Worsening ileus. Replace NG tube, NPO. Continue mobilizing, PT/OT. Keep K>4 and Mag>2. Schedule low dose reglan. If ileus does not resolve in a couple days may consider repeat CT scan. I spoke with the patient's husband on the phone in the room.  ID -zosyn 2/17>>2/20, cefotetan periop x1 FEN -IVF, NPO/NGT, TPN VTE -SCDs, ok for chemical DVT prophylaxis from surgical standpoint Foley -d/c 2/23 Follow up -Dr. Marlou Starks   LOS: 9 days    Wellington Hampshire, [facility] 07/05/2019, 8:48 AM Please see Amion for pager number during day hours 7:00am-4:30pm

## 2019-07-05 NOTE — Progress Notes (Addendum)
Patient did not have acute change for this RN compared to prior shift in AM but did have significant decline after NGT placement this afternoon.  RR RN/AC called to see patient and patient has been moved to higher level of care.  Hospitalist and Surgery have been aware of patient's status throughout the day.   Vital Signs MEWS/VS Documentation      07/05/2019 0751 07/05/2019 1045 07/05/2019 1337 07/05/2019 1613   MEWS Score:  6  5  7  6    MEWS Score Color:  Red  Red  Red  Red   Resp:  (!) 44  (!) 40  (!) 42  --   Pulse:  (!) 133  (!) 104  (!) 132  --   BP:  (!) 142/88  139/84  (!) 149/74  --   Temp:  --  98.5 F (36.9 C)  99.3 F (37.4 C)  --   O2 Device:  Room Air  Room [employer]  --   Level of Consciousness:  --  Responds to Voice  --  --           Lacinda Axon 07/05/2019,4:46 PM

## 2019-07-05 NOTE — Progress Notes (Signed)
PHARMACY NOTE -  Unasyn  Pharmacy has been assisting with dosing of Unasyn for aspiration PNA.  Dosage remains stable at 3g IV q6 hr and need for further dosage adjustment appears unlikely at present given CrCl well above adjustment threshold  Pharmacy will sign off, following peripherally for culture results or dose adjustments. Please reconsult if a change in clinical status warrants re-evaluation of dosage.  Reuel Boom, PharmD, BCPS [phone] 07/05/2019, 4:20 PM

## 2019-07-05 NOTE — Progress Notes (Signed)
Pt experienced another episode of  emesis and blood sugar elevation of 207. NP on call X. Blount made aware. No new orders at this time.

## 2019-07-05 NOTE — Progress Notes (Signed)
PHARMACY - TOTAL PARENTERAL NUTRITION CONSULT NOTE   Indication: bowel obstruction  Patient Measurements: Height: 5\' 9"  (175.3 cm) Weight: 166 lb 10.7 oz (75.6 kg) IBW/kg (Calculated) : 66.2 TPN AdjBW (KG): 66.1 Body mass index is 24.61 kg/m. Usual Weight:   Assessment: 84 yo woman with hx of pelvic carcinoid tumor, admitted with N/V, decreased po intake was found to have SBO and gastric distention.  Note that below lab assessment is without TPN current ordered Glucose / Insulin: hx of type II diabetes, controlled with diet. CBGs - range 139-305, 7 units insulin given in the past 24hrs Electrolytes: Phos 2.1, mag 1.7, K 3.7, and Na 133  Renal: WNL LFTs / TGs: AST/ALT 55/56, TBili 1.0, TGs 108 Prealbumin / albumin: 16.9 (2/19), 12.1 (2/23) Intake / Output; MIVF: D5 1/2 NS @ 60 ml/hr GI Imaging: 2/21 X-ray show persistently distended stomach Surgeries / Procedures: 2/22 exploratory laparotomy (n/a) lysis of adhesions  Central access: PICC 2/22, pt pulled PICC out on 2/23 ~ 2100,  re-insert PICC 2/26 and restart TPN TPN start date: 2/22 -2/23, 2/26 >>   Nutritional Goals RD recs KCal:1700-1900 , Protein:85-100gm , Fluid: </= 2 L/day  Goal TPN rate is 75 mL/hr (provides 90 g of protein and 1818 kcals per day) Protein: 50g/L Lipids 30g/L Dextrose 15%  Current Nutrition:  Full liquid diet, boost ordered tid, pro-stat bid CL diet  Plan:  Mag sulfate 2gm IV x 1 KPhos 17mmol IV x 1 continue TPN at 1800 at rate 40 ml/hr Will increase TPN toward goal rate above as tolerated Electrolytes in TPN: 73mEq/L of Na, 98mEq/L of K, 43mEq/L of Ca, 26mEq/L of Mg, and 24mmol/L of Phos. Cl:Ac ratio 1:1 Add trace elements to TPN daily and standard MVI on MWF only due to national backorder Adjust SSI to q4h,  adjust as needed   MIVF - D5 1/2NS at 61ml/hr Monitor TPN labs on Mon/Thurs BMet with mag and phos in am F/u if tolerated diet/need for TPN   Dolly Rias RPh 07/05/2019, 7:39  AM

## 2019-07-05 NOTE — Progress Notes (Signed)
Patient is currently in the ICU and the ICU nurse can draw this patient's labs from the patient's PICC line

## 2019-07-06 ENCOUNTER — Ambulatory Visit: Payer: Medicare Other

## 2019-07-06 ENCOUNTER — Inpatient Hospital Stay (HOSPITAL_COMMUNITY): Payer: Medicare Other

## 2019-07-06 LAB — BASIC METABOLIC PANEL
Anion gap: 9 (ref 5–15)
BUN: 19 mg/dL (ref 8–23)
CO2: 20 mmol/L — ABNORMAL LOW (ref 22–32)
Calcium: 8.3 mg/dL — ABNORMAL LOW (ref 8.9–10.3)
Chloride: 105 mmol/L (ref 98–111)
Creatinine, Ser: 0.64 mg/dL (ref 0.44–1.00)
GFR calc Af Amer: >60 mL/min (ref >60)
GFR calc non Af Amer: >60 mL/min (ref >60)
Glucose, Bld: 125 mg/dL — ABNORMAL HIGH (ref 70–99)
Potassium: 4 mmol/L (ref 3.5–5.1)
Sodium: 134 mmol/L — ABNORMAL LOW (ref 135–145)

## 2019-07-06 LAB — GLUCOSE, CAPILLARY
Glucose-Capillary: 117 mg/dL — ABNORMAL HIGH (ref 70–99)
Glucose-Capillary: 124 mg/dL — ABNORMAL HIGH (ref 70–99)
Glucose-Capillary: 128 mg/dL — ABNORMAL HIGH (ref 70–99)
Glucose-Capillary: 135 mg/dL — ABNORMAL HIGH (ref 70–99)
Glucose-Capillary: 145 mg/dL — ABNORMAL HIGH (ref 70–99)
Glucose-Capillary: 162 mg/dL — ABNORMAL HIGH (ref 70–99)

## 2019-07-06 LAB — PROCALCITONIN: Procalcitonin: 1.28 ng/mL

## 2019-07-06 LAB — CBC
HCT: 31.8 % — ABNORMAL LOW (ref 36.0–46.0)
Hemoglobin: 10.4 g/dL — ABNORMAL LOW (ref 12.0–15.0)
MCH: 26.3 pg (ref 26.0–34.0)
MCHC: 32.7 g/dL (ref 30.0–36.0)
MCV: 80.3 fL (ref 80.0–100.0)
Platelets: 158 10*3/uL (ref 150–400)
RBC: 3.96 MIL/uL (ref 3.87–5.11)
RDW: 17 % — ABNORMAL HIGH (ref 11.5–15.5)
WBC: 11.8 10*3/uL — ABNORMAL HIGH (ref 4.0–10.5)
nRBC: 0 % (ref 0.0–0.2)

## 2019-07-06 LAB — PHOSPHORUS: Phosphorus: 2.6 mg/dL (ref 2.5–4.6)

## 2019-07-06 LAB — MAGNESIUM: Magnesium: 2.1 mg/dL (ref 1.7–2.4)

## 2019-07-06 MED ORDER — ACETAMINOPHEN 10 MG/ML IV SOLN
1000.0000 mg | Freq: Four times a day (QID) | INTRAVENOUS | Status: AC
  Administered 2019-07-06 – 2019-07-07 (×4): 1000 mg via INTRAVENOUS
  Filled 2019-07-06 (×4): qty 100

## 2019-07-06 MED ORDER — IOHEXOL 300 MG/ML  SOLN
100.0000 mL | Freq: Once | INTRAMUSCULAR | Status: AC | PRN
  Administered 2019-07-06: 100 mL via INTRAVENOUS

## 2019-07-06 MED ORDER — TRAVASOL 10 % IV SOLN
INTRAVENOUS | Status: AC
  Filled 2019-07-06: qty 720

## 2019-07-06 MED ORDER — SODIUM CHLORIDE (PF) 0.9 % IJ SOLN
INTRAMUSCULAR | Status: AC
  Filled 2019-07-06: qty 50

## 2019-07-06 NOTE — Progress Notes (Signed)
Assessment & Plan: POD#5 SBO- EXPLORATORY LAPAROTOMY w/ LYSIS OF ADHESIONS2/22 Dr. Marlou Starks  CT scan reviewed - no acute findings, dilated SB loops, post surgical changes, ?aspiration  BM's overnight, active BS this AM  NG in place - thin bilious output - continue NPO  Possible aspiration event - per medical service  DM Protein calorie Malnutrition - prealbumin 12.1 (2/23) H/o Carcinoid tumor s/p ex lapwith bilateral salpingo-oophorectomy, omentectomy and radical debulkingfor suspected ovarian cancer 08/31/2017 Dr. Denman George  ID -zosyn 2/17>>2/20, cefotetan periop x1 FEN -IVF,NPO/NGT, TPN VTE -SCDs, ok for chemical DVT prophylaxis from surgical standpoint Follow up -Dr. Sid Falcon, MD       [facility]       Office: [phone]   Chief Complaint: SBO  Subjective: Patient in bed in ICU, opens eyes to voice.  Objective: Vital signs in last 24 hours: Temp:  [98.5 F (36.9 C)-100.9 F (38.3 C)] 99.2 F (37.3 C) (02/27 0400) Pulse Rate:  [90-133] 90 (02/27 0700) Resp:  [26-44] 26 (02/27 0700) BP: (104-183)/(47-88) 116/47 (02/27 0700) SpO2:  [98 %-100 %] 100 % (02/27 0700) Weight:  [70.1 kg] 70.1 kg (02/27 0500) Last BM Date: 07/05/19  Intake/Output from previous day: 02/26 0701 - 02/27 0700 In: 2668.2 [I.V.:1920.6; IV Piggyback:747.6] Out: 1600 [Urine:300; Emesis/NG output:1300] Intake/Output this shift: No intake/output data recorded.  Physical Exam: HEENT - sclerae clear, mucous membranes moist Neck - soft Abdomen - mild distension; active BS present; midline wound dry and intact   Lab Results:  Recent Labs    07/05/19 1734 07/06/19 0153  WBC 11.5* 11.8*  HGB 11.8* 10.4*  HCT 36.5 31.8*  PLT 158 158   BMET Recent Labs    07/05/19 1734 07/06/19 0153  NA 128* 134*  K 5.5* 4.0  CL 99 105  CO2 18* 20*  GLUCOSE 323* 125*  BUN 18 19  CREATININE 0.82 0.64  CALCIUM 8.4* 8.3*   PT/INR No results for  input(s): LABPROT, INR in the last 72 hours. Comprehensive Metabolic Panel:    Component Value Date/Time   NA 134 (L) 07/06/2019 0153   NA 128 (L) 07/05/2019 1734   NA 142 02/26/2019 1618   NA 141 02/04/2019 1719   K 4.0 07/06/2019 0153   K 5.5 (H) 07/05/2019 1734   CL 105 07/06/2019 0153   CL 99 07/05/2019 1734   CO2 20 (L) 07/06/2019 0153   CO2 18 (L) 07/05/2019 1734   BUN 19 07/06/2019 0153   BUN 18 07/05/2019 1734   BUN 19 02/26/2019 1618   BUN 15 02/04/2019 1719   CREATININE 0.64 07/06/2019 0153   CREATININE 0.82 07/05/2019 1734   GLUCOSE 125 (H) 07/06/2019 0153   GLUCOSE 323 (H) 07/05/2019 1734   CALCIUM 8.3 (L) 07/06/2019 0153   CALCIUM 8.4 (L) 07/05/2019 1734   AST 38 07/05/2019 1734   AST 55 (H) 07/05/2019 0348   ALT 48 (H) 07/05/2019 1734   ALT 56 (H) 07/05/2019 0348   ALKPHOS 84 07/05/2019 1734   ALKPHOS 91 07/05/2019 0348   BILITOT 1.2 07/05/2019 1734   BILITOT 1.1 07/05/2019 0348   BILITOT 0.4 02/04/2019 1719   PROT 6.4 (L) 07/05/2019 1734   PROT 6.6 07/05/2019 0348   PROT 7.7 02/04/2019 1719   ALBUMIN 2.4 (L) 07/05/2019 1734   ALBUMIN 2.5 (L) 07/05/2019 0348   ALBUMIN 4.1 02/04/2019 1719    Studies/Results: DG Chest 1 View  Result Date:  07/05/2019 CLINICAL DATA:  Tachypnea. EXAM: CHEST  1 VIEW COMPARISON:  June 26, 2019. FINDINGS: The heart size and mediastinal contours are within normal limits. Both lungs are clear. Right-sided PICC line is noted with distal tip in expected position of the SVC. No pneumothorax or pleural effusion is noted. The visualized skeletal structures are unremarkable. IMPRESSION: No active disease. Electronically Signed   By: Marijo Conception M.D.   On: 07/05/2019 09:11   DG Abd 1 View  Result Date: 07/05/2019 CLINICAL DATA:  Nasogastric tube placement. EXAM: ABDOMEN - 1 VIEW COMPARISON:  None. FINDINGS: The visualized bowel gas pattern is normal. Nasogastric tube is not included in field-of-view. No radio-opaque calculi or  other significant radiographic abnormality are seen. IMPRESSION: Nasogastric tube is not included in field-of-view. Electronically Signed   By: Marijo Conception M.D.   On: 07/05/2019 09:12   CT ABDOMEN PELVIS W CONTRAST  Result Date: 07/06/2019 CLINICAL DATA:  Small-bowel obstruction status post lysis of adhesions and exploratory laparotomy 07/01/2019, nausea and abdominal distension after removal of enteric catheter EXAM: CT ABDOMEN AND PELVIS WITH CONTRAST TECHNIQUE: Multidetector CT imaging of the abdomen and pelvis was performed using the standard protocol following bolus administration of intravenous contrast. CONTRAST:  159mL OMNIPAQUE IOHEXOL 300 MG/ML  SOLN COMPARISON:  07/05/2019, 06/26/2019 FINDINGS: Lower chest: There is been interval development of patchy airspace disease within the lingula and left lower lobe. Differential includes infection or aspiration. Trace bilateral pleural effusions. Hepatobiliary: Gallbladder is decompressed, without evidence of cholelithiasis or cholecystitis. Liver is unremarkable. Pancreas: Unremarkable. No pancreatic ductal dilatation or surrounding inflammatory changes. Spleen: Normal in size without focal abnormality. Adrenals/Urinary Tract: Kidneys are unremarkable. No obstructive uropathy. Gas in the bladder lumen is likely due to recent catheterization. No filling defects. The adrenals are unremarkable. Stomach/Bowel: Enteric catheter extends to the gastric antrum. Multiple dilated loops of small bowel are identified, measuring up to 3.2 cm in diameter. Transition from dilated to nondilated bowel is seen within the distal jejunum in the right lower quadrant, with decompressed terminal ileum. There is a normal appendix. Vascular/Lymphatic: Minimal atherosclerosis of the abdominal aorta unchanged. Enlarged mesenteric lymph node right lower quadrant measures up to 14 mm in short axis, not significantly changed since prior study. No other pathologic adenopathy.  Reproductive: Status post hysterectomy. No adnexal masses. Other: Free gas within the abdomen consistent with recent exploratory laparotomy. Midline skin staples are noted within the anterior abdominal wall. Small amount of gas extends into a fat containing right inguinal hernia. Trace pelvic free fluid.  No fluid collection or abscess. Musculoskeletal: No acute or destructive bony lesions. Reconstructed images demonstrate no additional findings. IMPRESSION: 1. Distended loops of small bowel, with transition in the distal jejunum right lower quadrant. Findings are consistent with small-bowel obstruction. 2. Interval development of left basilar airspace disease which could reflect pneumonia or aspiration. 3. Postsurgical changes from midline laparotomy. 4. Enteric catheter within the gastric lumen. Electronically Signed   By: Randa Ngo M.D.   On: 07/06/2019 02:48   DG Abd Portable 1V  Result Date: 07/04/2019 CLINICAL DATA:  Abdominal distention. Status post lysis of adhesions on 07/01/2019. EXAM: PORTABLE ABDOMEN - 1 VIEW COMPARISON:  06/29/2019 FINDINGS: There has been increased small bowel dilatation in the mid abdomen since the prior study. Colon is not distended but there is new air in the nondistended colon. Interval abdominal surgery for lysis of adhesions. IMPRESSION: Increasing small bowel dilatation since the prior study. This could represent ileus or recurrent small  bowel obstruction. Electronically Signed   By: Lorriane Shire M.D.   On: 07/04/2019 12:58   DG INTRO LONG GI TUBE  Result Date: 07/05/2019 CLINICAL DATA:  84 year old female with history of abdominal distension. EXAM: FL FEEDING TUBE PLACEMENT CONTRAST:  None FLUOROSCOPY TIME:  Fluoroscopy Time:  36 seconds Radiation Exposure Index (if provided by the fluoroscopic device): 1.2 mGy Number of Acquired Spot Images: 0 COMPARISON:  None. FINDINGS: Intermittent fluoroscopy was utilized to pass a nasogastric tube into the stomach. Tip of  the tube was ultimately placed in the distal aspect of the stomach. IMPRESSION: 1. Successful fluoroscopic guided placement of nasogastric tube with tip in the distal stomach. Electronically Signed   By: Vinnie Langton M.D.   On: 07/05/2019 16:11   [place] EKG SITE RITE  Result Date: 07/04/2019 If [facility] image not attached, placement could not be confirmed due to current cardiac rhythm.     [name] 07/06/2019  Patient ID: Gloria Murphy, female   DOB: August 16, 1935, 84 y.o.   MRN: [id number]

## 2019-07-06 NOTE — Progress Notes (Signed)
PT Cancellation Note  Patient Details Name: Gloria Murphy MRN: [id number] DOB: 07/29/1935   Cancelled Treatment:    Reason Eval/Treat Not Completed: Medical issues which prohibited therapy, per RN.   Claretha Cooper 07/06/2019, 11:58 AM  [place] Pager [phone] Office [phone]

## 2019-07-06 NOTE — Progress Notes (Signed)
PHARMACY - TOTAL PARENTERAL NUTRITION CONSULT NOTE   Indication: bowel obstruction  Patient Measurements: Height: 5\' 9"  (175.3 cm) Weight: 154 lb 8.7 oz (70.1 kg) IBW/kg (Calculated) : 66.2 TPN AdjBW (KG): 66.1 Body mass index is 22.82 kg/m. Usual Weight:   Assessment: 84 yo woman with hx of pelvic carcinoid tumor, admitted with N/V, decreased po intake was found to have SBO and gastric distention.   Glucose / Insulin: hx of type II diabetes, controlled with diet. CBGs - range 125-323, 32 units insulin given in the past 24hrs Electrolytes:  Na 134, Mag 2.1 and phos 2.6 (repleted yest), K 4.0  Renal: WNL LFTs / TGs: AST/ALT 38/48, TBili 1.2, TGs 108 Prealbumin / albumin: 16.9 (2/19), 12.1 (2/23) Intake / Output; MIVF: no IV fluids GI Imaging: 2/21 X-ray show persistently distended stomach Surgeries / Procedures: 2/22 exploratory laparotomy (n/a) lysis of adhesions  Central access: PICC 2/22, pt pulled PICC out on 2/23 ~ 2100,  re-insert PICC 2/26 and restart TPN TPN start date: 2/22 -2/23, 2/26 >>   Nutritional Goals RD recs KCal:1700-1900 , Protein:85-100gm , Fluid: </= 2 L/day  Goal TPN rate is 75 mL/hr (provides 90 g of protein and 1818 kcals per day) Protein: 50g/L Lipids 30g/L Dextrose 15%  Current Nutrition:  boost ordered tid, pro-stat bid>>not taking either NPO  Plan:  increase TPN at 1800 to rate 60 ml/hr Will increase TPN toward goal rate above as tolerated Electrolytes in TPN: 66mEq/L of Na, 79mEq/L of K, 45mEq/L of Ca, 56mEq/L of Mg, and 51mmol/L of Phos. Cl:Ac ratio 1:1 Add trace elements to TPN daily and standard MVI on MWF only due to national backorder Add 15 units insulin to TPN  continue SSI to q4h,  adjust as needed  IVF per MD, none at present Monitor TPN labs on Mon/Thurs BMet with mag and phos in am F/u if tolerated diet/need for TPN   Dolly Rias RPh 07/06/2019, 8:13 AM

## 2019-07-06 NOTE — Progress Notes (Signed)
OT Cancellation Note  Patient Details Name: Gloria Murphy MRN: [id number] DOB: 12-Jun-1935   Cancelled Treatment:    Reason Eval/Treat Not Completed: Medical issues which prohibited therapy   Nyzier Boivin OTR/L   Shellia Cleverly 07/06/2019, 12:55 PM

## 2019-07-06 NOTE — Progress Notes (Signed)
PROGRESS NOTE    Gloria Murphy  [id number] DOB: 02/17/1936 DOA: 06/26/2019 PCP: Carlyle Basques, MD   Brief Narrative:  84 year old woman admitted from home on 2/17 due to nausea and vomiting with decreased p.o. intake. In the ED she was found to have a large small bowel obstruction and gastric distention. She was seen by surgery in the ED and NG tube was placed. She has a history of a pelvic carcinoid tumor for which she underwent exploratory laparotomy in April 2019 with bilateral salpingo-oophorectomy and omentectomy as well as partial hysterectomy. S/p OR on 2/22  Assessment & Plan:   Principal Problem:   SBO (small bowel obstruction) (HCC) Active Problems:   Malignant carcinoid tumor of unknown primary site [facility])   Type 2 diabetes mellitus without complication, without long-term current use of insulin (HCC)   AKI (acute kidney injury) (HCC)   Nausea and vomiting   Abdominal pain   Lactic acidosis   Elevated troponin   Sinus tachycardia   #1 small bowel obstruction status post exploratory laparotomy with lysis of adhesions 07/01/2019.NG tube was DC'd 07/03/2019 was started on soft diet but patient started to have nausea with abdominal distention. KUB show ileus versus recurrent obstruction. PICC placed again TPN started.  Patient kept n.p.o. placed NG tube back Draining bile Continue n.p.o. continue TPN Seen by PT recommends home health PT and rolling walker with 5 inch wheels. Husband wants to take her home with home health. CT abdomen-distended loops of small bowel with transition in the distal jejunum right lower quadrant findings consistent with small bowel obstruction with interval development of left basilar airspace disease which could reflect pneumonia or aspiration. #2 history of pelvic carcinoid tumor followed by Dr. Denman George.  #3 AKI resolved likely secondary to intravascular volume depletion improved with IV fluids.  #4 type 2 diabetes not on any medications  at home  #5 hypokalemia potassium 4.0 mag 2.1 repleted  #6 possible aspiration pneumonia patient has improved since starting Unasyn.  Continue Unasyn. Lactic acid 2.7 Procalcitonin 0.73 up to 1.28 Leukocytosis  improving white count 11.8     Nutrition Problem: Increased nutrient needs Etiology: acute illness, post-op healing     Signs/Symptoms: estimated needs    Interventions: Boost Breeze, Prostat, MVI  Estimated body mass index is 22.82 kg/m as calculated from the following:   Height as of this encounter: 5\' 9"  (1.753 m).   Weight as of this encounter: 70.1 kg.  DVT prophylaxis:Lovenox Code Status:Full code Family Communication:None Disposition Plan:Patient came from home plan is to discharge her home once her bowel functions return. She is admitted with small bowel obstruction status post lysis of adhesions.. She has recurrent ileus or recurrent small bowel obstruction by KUB. PICC line placed for TPN Surgery following not ready for discharge yet   Consultants:  General surgery  Procedures:NG tube placed 06/26/2019 exploratory laparotomy with lysis of adhesions on 07/01/2019 Antimicrobials:None  Subjective: Patient resting in bed NG tube in place suctioning bile green color staff.  Abdomen less distended still tender she is more awake she was able to identify her 2 daughters present in the room  Objective: Vitals:   07/06/19 0700 07/06/19 0800 07/06/19 1200 07/06/19 1334  BP: (!) 116/47 (!) 157/63 (!) 160/62   Pulse: 90 95 98   Resp: (!) 26 (!) 29 (!) 30   Temp: 98.9 F (37.2 C)   98.9 F (37.2 C)  TempSrc: Oral   Oral  SpO2: 100% 100% 100%   Weight:  Height:        Intake/Output Summary (Last 24 hours) at 07/06/2019 1407 Last data filed at 07/06/2019 0700 Gross per 24 hour  Intake 2668.19 ml  Output 1300 ml  Net 1368.19 ml   Filed Weights   07/02/19 0534 07/04/19 0300 07/06/19 0500  Weight: 77 kg 75.6 kg 70.1 kg     Examination:  General exam: Appears calm and comfortable  Respiratory system: Clear to auscultation. Respiratory effort normal. Cardiovascular system: S1 & S2 heard, RRR. No JVD, murmurs, rubs, gallops or clicks. No pedal edema. Gastrointestinal system: Abdomen is distended, soft and tender. No organomegaly or masses felt. Normal bowel sounds heard. Central nervous system: Awake . No focal neurological deficits. Extremities: 1+ pitting edema Skin: No rashes, lesions or ulcers Psychiatry: Unable to assess   Data Reviewed: I have personally reviewed following labs and imaging studies  CBC: Recent Labs  Lab 06/30/19 0454 06/30/19 0454 07/01/19 1146 07/02/19 0410 07/03/19 0424 07/05/19 1734 07/06/19 0153  WBC 8.0  --   --  6.9 6.9 11.5* 11.8*  NEUTROABS  --   --   --  5.4  --   --   --   HGB 10.8*   < > 11.6* 10.6* 11.2* 11.8* 10.4*  HCT 34.6*   < > 34.0* 33.8* 34.0* 36.5 31.8*  MCV 83.2  --   --  82.2 80.0 81.5 80.3  PLT 167  --   --  158 135* 158 158   < > = values in this interval not displayed.   Basic Metabolic Panel: Recent Labs  Lab 07/02/19 0410 07/02/19 0410 07/03/19 0424 07/04/19 0452 07/05/19 0348 07/05/19 1734 07/06/19 0153  NA 138   < > 136 134* 133* 128* 134*  K 3.7   < > 3.2* 3.6 3.7 5.5* 4.0  CL 105   < > 108 105 101 99 105  CO2 25   < > 20* 20* 21* 18* 20*  GLUCOSE 292*   < > 129* 192* 305* 323* 125*  BUN 16   < > 10 8 13 18 19   CREATININE 0.71   < > 0.54 0.63 0.68 0.82 0.64  CALCIUM 8.6*   < > 8.5* 8.3* 8.5* 8.4* 8.3*  MG 1.7  --  1.7 1.6* 1.7  --  2.1  PHOS 1.8*  --  2.0* 1.6* 2.1*  --  2.6   < > = values in this interval not displayed.   GFR: Estimated Creatinine Clearance: 57.6 mL/min (by C-G formula based on SCr of 0.64 mg/dL). Liver Function Tests: Recent Labs  Lab 07/02/19 0410 07/04/19 0452 07/05/19 0348 07/05/19 1734  AST 38 31 55* 38  ALT 31 35 56* 48*  ALKPHOS 64 86 91 84  BILITOT 1.3* 1.0 1.1 1.2  PROT 6.2* 6.3* 6.6  6.4*  ALBUMIN 2.5* 2.5* 2.5* 2.4*   No results for input(s): LIPASE, AMYLASE in the last 168 hours. No results for input(s): AMMONIA in the last 168 hours. Coagulation Profile: No results for input(s): INR, PROTIME in the last 168 hours. Cardiac Enzymes: No results for input(s): CKTOTAL, CKMB, CKMBINDEX, TROPONINI in the last 168 hours. BNP (last 3 results) No results for input(s): PROBNP in the last 8760 hours. HbA1C: No results for input(s): HGBA1C in the last 72 hours. CBG: Recent Labs  Lab 07/05/19 1953 07/06/19 0010 07/06/19 0412 07/06/19 0800 07/06/19 1158  GLUCAP 229* 117* 162* 145* 135*   Lipid Profile: No results for input(s): CHOL, HDL, LDLCALC, TRIG, CHOLHDL,  LDLDIRECT in the last 72 hours. Thyroid Function Tests: No results for input(s): TSH, T4TOTAL, FREET4, T3FREE, THYROIDAB in the last 72 hours. Anemia Panel: No results for input(s): VITAMINB12, FOLATE, FERRITIN, TIBC, IRON, RETICCTPCT in the last 72 hours. Sepsis Labs: Recent Labs  Lab 07/05/19 1734 07/05/19 2037 07/06/19 0153  PROCALCITON 0.73  --  1.28  LATICACIDVEN 2.7* 2.7*  --     Recent Results (from the past 240 hour(s))  Blood culture (routine x 2)     Status: None   Collection Time: 06/26/19  5:33 PM   Specimen: BLOOD  Result Value Ref Range Status   Specimen Description   Final    BLOOD LEFT ANTECUBITAL Performed at [facility], [place] [address] [place], [place] [address]    Special Requests   Final    BOTTLES DRAWN AEROBIC AND ANAEROBIC Blood Culture adequate volume Performed at [facility] [address], [place], [place] [address]    Culture   Final    NO GROWTH 5 DAYS Performed at [facility], [place] [address], [place],  [address]    Report Status 07/01/2019 FINAL  Final  Respiratory Panel by RT PCR (Flu A&B, Covid) - Nasopharyngeal Swab     Status: None   Collection Time: 06/26/19  6:07 PM   Specimen: Nasopharyngeal Swab   Result Value Ref Range Status   SARS Coronavirus 2 by RT PCR NEGATIVE NEGATIVE Final    Comment: (NOTE) SARS-CoV-2 target nucleic acids are NOT DETECTED. The SARS-CoV-2 RNA is generally detectable in upper respiratoy specimens during the acute phase of infection. The lowest concentration of SARS-CoV-2 viral copies this assay can detect is 131 copies/mL. A negative result does not preclude SARS-Cov-2 infection and should not be used as the sole basis for treatment or other patient management decisions. A negative result may occur with  improper specimen collection/handling, submission of specimen other than nasopharyngeal swab, presence of viral mutation(s) within the areas targeted by this assay, and inadequate number of viral copies (<131 copies/mL). A negative result must be combined with clinical observations, patient history, and epidemiological information. The expected result is Negative. Fact Sheet for Patients:  PinkCheek.be Fact Sheet for Healthcare Providers:  GravelBags.it This test is not yet ap proved or cleared by the [place] FDA and  has been authorized for detection and/or diagnosis of SARS-CoV-2 by FDA under an Emergency Use Authorization (EUA). This EUA will remain  in effect (meaning this test can be used) for the duration of the COVID-19 declaration under Section 564(b)(1) of the Act, 21 U.S.C. section 360bbb-3(b)(1), unless the authorization is terminated or revoked sooner.    Influenza A by PCR NEGATIVE NEGATIVE Final   Influenza B by PCR NEGATIVE NEGATIVE Final    Comment: (NOTE) The Xpert Xpress SARS-CoV-2/FLU/RSV assay is intended as an aid in  the diagnosis of influenza from Nasopharyngeal swab specimens and  should not be used as a sole basis for treatment. Nasal washings and  aspirates are unacceptable for Xpert Xpress SARS-CoV-2/FLU/RSV  testing. Fact Sheet for  Patients: PinkCheek.be Fact Sheet for Healthcare Providers: GravelBags.it This test is not yet approved or cleared by the [place] FDA and  has been authorized for detection and/or diagnosis of SARS-CoV-2 by  FDA under an Emergency Use Authorization (EUA). This EUA will remain  in effect (meaning this test can be used) for the duration of the  Covid-19 declaration under Section 564(b)(1) of the Act, 21  U.S.C. section  360bbb-3(b)(1), unless the authorization is  terminated or revoked. Performed at [facility], [place] [address], [place], [place] [address]   Blood culture (routine x 2)     Status: None   Collection Time: 06/26/19  6:12 PM   Specimen: Site Not Specified; Blood  Result Value Ref Range Status   Specimen Description   Final    SITE NOT SPECIFIED Performed at [facility] [address] [place], [place] [address]    Special Requests   Final    BOTTLES DRAWN AEROBIC AND ANAEROBIC Blood Culture adequate volume Performed at [facility] [address] [place], [place] [address]    Culture   Final    NO GROWTH 5 DAYS Performed at [facility], [place] [address], [place], [place] [address]    Report Status 07/01/2019 FINAL  Final  MRSA PCR Screening     Status: None   Collection Time: 07/05/19  3:59 PM   Specimen: Nasal Mucosa; Nasopharyngeal  Result Value Ref Range Status   MRSA by PCR NEGATIVE NEGATIVE Final    Comment:        The GeneXpert MRSA Assay (FDA approved for NASAL specimens only), is one component of a comprehensive MRSA colonization surveillance program. It is not intended to diagnose MRSA infection nor to guide or monitor treatment for MRSA infections. Performed at [facility], [place] [address] [place], [place] [address]   Culture, blood (routine x 2)     Status: None (Preliminary result)   Collection Time:  07/05/19  5:34 PM   Specimen: BLOOD LEFT FOREARM  Result Value Ref Range Status   Specimen Description   Final    BLOOD LEFT FOREARM Performed at [facility] [address] [place], [place] [address]    Special Requests   Final    BOTTLES DRAWN AEROBIC ONLY Blood Culture adequate volume Performed at [facility] [address], [place], [place] [address]    Culture   Final    NO GROWTH < 12 HOURS Performed at [facility] [address] [place], [place] [address]    Report Status PENDING  Incomplete  Culture, blood (routine x 2)     Status: None (Preliminary result)   Collection Time: 07/05/19  5:34 PM   Specimen: BLOOD  Result Value Ref Range Status   Specimen Description   Final    BLOOD LEFT ANTECUBITAL Performed at [facility] [address], [place], [place] [address]    Special Requests   Final    BOTTLES DRAWN AEROBIC ONLY Blood Culture adequate volume Performed at [facility] [address], [place], [place] [address]    Culture   Final    NO GROWTH < 12 HOURS Performed at [facility] [address], [place], [place] [address]    Report Status PENDING  Incomplete         Radiology Studies: DG Chest 1 View  Result Date: 07/05/2019 CLINICAL DATA:  Tachypnea. EXAM: CHEST  1 VIEW COMPARISON:  June 26, 2019. FINDINGS: The heart size and mediastinal contours are within normal limits. Both lungs are clear. Right-sided PICC line is noted with distal tip in expected position of the SVC. No pneumothorax or pleural effusion is noted. The visualized skeletal structures are unremarkable. IMPRESSION: No active disease. Electronically Signed   By: Marijo Conception M.D.   On: 07/05/2019 09:11  DG Abd 1 View  Result Date: 07/05/2019 CLINICAL DATA:  Nasogastric tube placement. EXAM: ABDOMEN - 1 VIEW COMPARISON:  None. FINDINGS: The visualized bowel gas pattern is normal.  Nasogastric tube is not included in field-of-view. No radio-opaque calculi or other significant radiographic abnormality are seen. IMPRESSION: Nasogastric tube is not included in field-of-view. Electronically Signed   By: Marijo Conception M.D.   On: 07/05/2019 09:12   CT ABDOMEN PELVIS W CONTRAST  Result Date: 07/06/2019 CLINICAL DATA:  Small-bowel obstruction status post lysis of adhesions and exploratory laparotomy 07/01/2019, nausea and abdominal distension after removal of enteric catheter EXAM: CT ABDOMEN AND PELVIS WITH CONTRAST TECHNIQUE: Multidetector CT imaging of the abdomen and pelvis was performed using the standard protocol following bolus administration of intravenous contrast. CONTRAST:  117mL OMNIPAQUE IOHEXOL 300 MG/ML  SOLN COMPARISON:  07/05/2019, 06/26/2019 FINDINGS: Lower chest: There is been interval development of patchy airspace disease within the lingula and left lower lobe. Differential includes infection or aspiration. Trace bilateral pleural effusions. Hepatobiliary: Gallbladder is decompressed, without evidence of cholelithiasis or cholecystitis. Liver is unremarkable. Pancreas: Unremarkable. No pancreatic ductal dilatation or surrounding inflammatory changes. Spleen: Normal in size without focal abnormality. Adrenals/Urinary Tract: Kidneys are unremarkable. No obstructive uropathy. Gas in the bladder lumen is likely due to recent catheterization. No filling defects. The adrenals are unremarkable. Stomach/Bowel: Enteric catheter extends to the gastric antrum. Multiple dilated loops of small bowel are identified, measuring up to 3.2 cm in diameter. Transition from dilated to nondilated bowel is seen within the distal jejunum in the right lower quadrant, with decompressed terminal ileum. There is a normal appendix. Vascular/Lymphatic: Minimal atherosclerosis of the abdominal aorta unchanged. Enlarged mesenteric lymph node right lower quadrant measures up to 14 mm in short axis, not  significantly changed since prior study. No other pathologic adenopathy. Reproductive: Status post hysterectomy. No adnexal masses. Other: Free gas within the abdomen consistent with recent exploratory laparotomy. Midline skin staples are noted within the anterior abdominal wall. Small amount of gas extends into a fat containing right inguinal hernia. Trace pelvic free fluid.  No fluid collection or abscess. Musculoskeletal: No acute or destructive bony lesions. Reconstructed images demonstrate no additional findings. IMPRESSION: 1. Distended loops of small bowel, with transition in the distal jejunum right lower quadrant. Findings are consistent with small-bowel obstruction. 2. Interval development of left basilar airspace disease which could reflect pneumonia or aspiration. 3. Postsurgical changes from midline laparotomy. 4. Enteric catheter within the gastric lumen. Electronically Signed   By: Randa Ngo M.D.   On: 07/06/2019 02:48   DG INTRO LONG GI TUBE  Result Date: 07/05/2019 CLINICAL DATA:  84 year old female with history of abdominal distension. EXAM: FL FEEDING TUBE PLACEMENT CONTRAST:  None FLUOROSCOPY TIME:  Fluoroscopy Time:  36 seconds Radiation Exposure Index (if provided by the fluoroscopic device): 1.2 mGy Number of Acquired Spot Images: 0 COMPARISON:  None. FINDINGS: Intermittent fluoroscopy was utilized to pass a nasogastric tube into the stomach. Tip of the tube was ultimately placed in the distal aspect of the stomach. IMPRESSION: 1. Successful fluoroscopic guided placement of nasogastric tube with tip in the distal stomach. Electronically Signed   By: Vinnie Langton M.D.   On: 07/05/2019 16:11        Scheduled Meds: . acetaminophen  650 mg Oral Q6H  . chlorhexidine  15 mL Mouth Rinse BID  . Chlorhexidine Gluconate Cloth  6 each Topical Daily  . docusate sodium  100 mg Oral BID  . feeding supplement  1 Container Oral TID BM  . feeding supplement (PRO-STAT SUGAR FREE 64)   30 mL Oral BID  . insulin aspart  0-15 Units Subcutaneous Q4H  . mouth rinse  15 mL Mouth Rinse q12n4p  . methocarbamol  500 mg Oral Q8H  . metoCLOPramide (REGLAN) injection  5 mg Intravenous Q8H  . metoprolol tartrate  5 mg Intravenous Q6H  . pantoprazole (PROTONIX) IV  40 mg Intravenous Q24H  . polyvinyl alcohol  1 drop Both Eyes TID  . scopolamine  1 patch Transdermal Q72H  . sodium chloride flush  10-40 mL Intracatheter Q12H  . sodium chloride flush  3 mL Intravenous Once  . sodium chloride flush  3 mL Intravenous Q12H   Continuous Infusions: . acetaminophen    . ampicillin-sulbactam (UNASYN) IV Stopped (07/06/19 1300)  . TPN ADULT (ION) 40 mL/hr at 07/06/19 0600  . TPN ADULT (ION)       LOS: 10 days     Georgette Shell, MD  07/06/2019, 2:07 PM

## 2019-07-07 LAB — CBC
HCT: 30.1 % — ABNORMAL LOW (ref 36.0–46.0)
Hemoglobin: 9.7 g/dL — ABNORMAL LOW (ref 12.0–15.0)
MCH: 26.2 pg (ref 26.0–34.0)
MCHC: 32.2 g/dL (ref 30.0–36.0)
MCV: 81.4 fL (ref 80.0–100.0)
Platelets: 195 10*3/uL (ref 150–400)
RBC: 3.7 MIL/uL — ABNORMAL LOW (ref 3.87–5.11)
RDW: 17.3 % — ABNORMAL HIGH (ref 11.5–15.5)
WBC: 8.4 10*3/uL (ref 4.0–10.5)
nRBC: 0 % (ref 0.0–0.2)

## 2019-07-07 LAB — PHOSPHORUS: Phosphorus: 2.1 mg/dL — ABNORMAL LOW (ref 2.5–4.6)

## 2019-07-07 LAB — GLUCOSE, CAPILLARY
Glucose-Capillary: 115 mg/dL — ABNORMAL HIGH (ref 70–99)
Glucose-Capillary: 117 mg/dL — ABNORMAL HIGH (ref 70–99)
Glucose-Capillary: 139 mg/dL — ABNORMAL HIGH (ref 70–99)
Glucose-Capillary: 139 mg/dL — ABNORMAL HIGH (ref 70–99)
Glucose-Capillary: 86 mg/dL (ref 70–99)
Glucose-Capillary: 90 mg/dL (ref 70–99)
Glucose-Capillary: 97 mg/dL (ref 70–99)

## 2019-07-07 LAB — PROCALCITONIN: Procalcitonin: 0.99 ng/mL

## 2019-07-07 LAB — BASIC METABOLIC PANEL
Anion gap: 9 (ref 5–15)
BUN: 17 mg/dL (ref 8–23)
CO2: 19 mmol/L — ABNORMAL LOW (ref 22–32)
Calcium: 8.4 mg/dL — ABNORMAL LOW (ref 8.9–10.3)
Chloride: 109 mmol/L (ref 98–111)
Creatinine, Ser: 0.6 mg/dL (ref 0.44–1.00)
GFR calc Af Amer: >60 mL/min (ref >60)
GFR calc non Af Amer: >60 mL/min (ref >60)
Glucose, Bld: 122 mg/dL — ABNORMAL HIGH (ref 70–99)
Potassium: 3.6 mmol/L (ref 3.5–5.1)
Sodium: 137 mmol/L (ref 135–145)

## 2019-07-07 LAB — MAGNESIUM: Magnesium: 1.8 mg/dL (ref 1.7–2.4)

## 2019-07-07 LAB — TSH: TSH: 0.064 u[IU]/mL — ABNORMAL LOW (ref 0.350–4.500)

## 2019-07-07 MED ORDER — METOPROLOL TARTRATE 25 MG PO TABS
25.0000 mg | ORAL_TABLET | Freq: Two times a day (BID) | ORAL | Status: DC
  Administered 2019-07-07 – 2019-07-13 (×9): 25 mg via ORAL
  Filled 2019-07-07 (×9): qty 1

## 2019-07-07 MED ORDER — METOPROLOL TARTRATE 5 MG/5ML IV SOLN
5.0000 mg | Freq: Four times a day (QID) | INTRAVENOUS | Status: DC | PRN
  Administered 2019-07-07 – 2019-07-09 (×4): 5 mg via INTRAVENOUS
  Filled 2019-07-07 (×4): qty 5

## 2019-07-07 MED ORDER — ENALAPRILAT 1.25 MG/ML IV SOLN
0.6250 mg | Freq: Once | INTRAVENOUS | Status: AC
  Administered 2019-07-07: 0.625 mg via INTRAVENOUS
  Filled 2019-07-07: qty 1

## 2019-07-07 MED ORDER — ACETAMINOPHEN 650 MG RE SUPP
650.0000 mg | Freq: Four times a day (QID) | RECTAL | Status: DC | PRN
  Administered 2019-07-07: 650 mg via RECTAL
  Filled 2019-07-07: qty 1

## 2019-07-07 MED ORDER — POTASSIUM PHOSPHATES 15 MMOLE/5ML IV SOLN
20.0000 mmol | Freq: Once | INTRAVENOUS | Status: AC
  Administered 2019-07-07: 11:00:00 20 mmol via INTRAVENOUS
  Filled 2019-07-07: qty 6.67

## 2019-07-07 MED ORDER — LABETALOL HCL 5 MG/ML IV SOLN
5.0000 mg | Freq: Once | INTRAVENOUS | Status: AC
  Administered 2019-07-07: 05:00:00 5 mg via INTRAVENOUS
  Filled 2019-07-07: qty 4

## 2019-07-07 MED ORDER — TRAVASOL 10 % IV SOLN
INTRAVENOUS | Status: AC
  Filled 2019-07-07: qty 900

## 2019-07-07 MED ORDER — METHIMAZOLE 5 MG PO TABS
5.0000 mg | ORAL_TABLET | Freq: Every day | ORAL | Status: DC
  Administered 2019-07-07 – 2019-07-14 (×6): 5 mg via ORAL
  Filled 2019-07-07 (×9): qty 1

## 2019-07-07 NOTE — Progress Notes (Signed)
Assessment & Plan: POD#6 SBO- EXPLORATORY LAPAROTOMY w/ LYSIS OF ADHESIONS2/22 Dr. Marlou Starks             BM's overnight, active BS this AM             NG in place - minimal output, will remove this AM  Begin trial of clear liquids  Possible aspiration event - per medical service  DM Protein calorie Malnutrition - prealbumin 12.1 (2/23) H/o Carcinoid tumor s/p ex lapwith bilateral salpingo-oophorectomy, omentectomy and radical debulkingfor suspected ovarian cancer 08/31/2017 Dr. Denman George  ID -zosyn 2/17>>2/20, cefotetan periop x1 FEN -IVF,NPO/NGT, TPN VTE -SCDs, ok for chemical DVT prophylaxis from surgical standpoint Follow up -Dr. Sid Falcon, MD       [facility]       Office: [phone]   Chief Complaint: SBO  Subjective: Patient in bed, more alert and responsive.  Denies abdominal pain.  Having small BM's.  Objective: Vital signs in last 24 hours: Temp:  [98.2 F (36.8 C)-99.2 F (37.3 C)] 98.2 F (36.8 C) (02/28 0351) Pulse Rate:  [95-110] 104 (02/28 0700) Resp:  [22-36] 22 (02/28 0700) BP: (126-194)/(52-91) 155/65 (02/28 0700) SpO2:  [96 %-100 %] 99 % (02/28 0700) Last BM Date: 07/07/19  Intake/Output from previous day: 02/27 0701 - 02/28 0700 In: 2006.9 [I.V.:1246.9; IV Piggyback:760] Out: 900 [Urine:650; Emesis/NG output:250] Intake/Output this shift: No intake/output data recorded.  Physical Exam: HEENT - sclerae clear, mucous membranes moist Neck - soft Abdomen - softer, less distended; BS present; midline wound dry and intact  Lab Results:  Recent Labs    07/06/19 0153 07/07/19 0327  WBC 11.8* 8.4  HGB 10.4* 9.7*  HCT 31.8* 30.1*  PLT 158 195   BMET Recent Labs    07/06/19 0153 07/07/19 0327  NA 134* 137  K 4.0 3.6  CL 105 109  CO2 20* 19*  GLUCOSE 125* 122*  BUN 19 17  CREATININE 0.64 0.60  CALCIUM 8.3* 8.4*   PT/INR No results for input(s): LABPROT, INR in the last 72  hours. Comprehensive Metabolic Panel:    Component Value Date/Time   NA 137 07/07/2019 0327   NA 134 (L) 07/06/2019 0153   NA 142 02/26/2019 1618   NA 141 02/04/2019 1719   K 3.6 07/07/2019 0327   K 4.0 07/06/2019 0153   CL 109 07/07/2019 0327   CL 105 07/06/2019 0153   CO2 19 (L) 07/07/2019 0327   CO2 20 (L) 07/06/2019 0153   BUN 17 07/07/2019 0327   BUN 19 07/06/2019 0153   BUN 19 02/26/2019 1618   BUN 15 02/04/2019 1719   CREATININE 0.60 07/07/2019 0327   CREATININE 0.64 07/06/2019 0153   GLUCOSE 122 (H) 07/07/2019 0327   GLUCOSE 125 (H) 07/06/2019 0153   CALCIUM 8.4 (L) 07/07/2019 0327   CALCIUM 8.3 (L) 07/06/2019 0153   AST 38 07/05/2019 1734   AST 55 (H) 07/05/2019 0348   ALT 48 (H) 07/05/2019 1734   ALT 56 (H) 07/05/2019 0348   ALKPHOS 84 07/05/2019 1734   ALKPHOS 91 07/05/2019 0348   BILITOT 1.2 07/05/2019 1734   BILITOT 1.1 07/05/2019 0348   BILITOT 0.4 02/04/2019 1719   PROT 6.4 (L) 07/05/2019 1734   PROT 6.6 07/05/2019 0348   PROT 7.7 02/04/2019 1719   ALBUMIN 2.4 (L) 07/05/2019 1734   ALBUMIN 2.5 (L) 07/05/2019 0348   ALBUMIN 4.1 02/04/2019 1719  Studies/Results: DG Chest 1 View  Result Date: 07/05/2019 CLINICAL DATA:  Tachypnea. EXAM: CHEST  1 VIEW COMPARISON:  June 26, 2019. FINDINGS: The heart size and mediastinal contours are within normal limits. Both lungs are clear. Right-sided PICC line is noted with distal tip in expected position of the SVC. No pneumothorax or pleural effusion is noted. The visualized skeletal structures are unremarkable. IMPRESSION: No active disease. Electronically Signed   By: Marijo Conception M.D.   On: 07/05/2019 09:11   DG Abd 1 View  Result Date: 07/05/2019 CLINICAL DATA:  Nasogastric tube placement. EXAM: ABDOMEN - 1 VIEW COMPARISON:  None. FINDINGS: The visualized bowel gas pattern is normal. Nasogastric tube is not included in field-of-view. No radio-opaque calculi or other significant radiographic abnormality are  seen. IMPRESSION: Nasogastric tube is not included in field-of-view. Electronically Signed   By: Marijo Conception M.D.   On: 07/05/2019 09:12   CT ABDOMEN PELVIS W CONTRAST  Result Date: 07/06/2019 CLINICAL DATA:  Small-bowel obstruction status post lysis of adhesions and exploratory laparotomy 07/01/2019, nausea and abdominal distension after removal of enteric catheter EXAM: CT ABDOMEN AND PELVIS WITH CONTRAST TECHNIQUE: Multidetector CT imaging of the abdomen and pelvis was performed using the standard protocol following bolus administration of intravenous contrast. CONTRAST:  159mL OMNIPAQUE IOHEXOL 300 MG/ML  SOLN COMPARISON:  07/05/2019, 06/26/2019 FINDINGS: Lower chest: There is been interval development of patchy airspace disease within the lingula and left lower lobe. Differential includes infection or aspiration. Trace bilateral pleural effusions. Hepatobiliary: Gallbladder is decompressed, without evidence of cholelithiasis or cholecystitis. Liver is unremarkable. Pancreas: Unremarkable. No pancreatic ductal dilatation or surrounding inflammatory changes. Spleen: Normal in size without focal abnormality. Adrenals/Urinary Tract: Kidneys are unremarkable. No obstructive uropathy. Gas in the bladder lumen is likely due to recent catheterization. No filling defects. The adrenals are unremarkable. Stomach/Bowel: Enteric catheter extends to the gastric antrum. Multiple dilated loops of small bowel are identified, measuring up to 3.2 cm in diameter. Transition from dilated to nondilated bowel is seen within the distal jejunum in the right lower quadrant, with decompressed terminal ileum. There is a normal appendix. Vascular/Lymphatic: Minimal atherosclerosis of the abdominal aorta unchanged. Enlarged mesenteric lymph node right lower quadrant measures up to 14 mm in short axis, not significantly changed since prior study. No other pathologic adenopathy. Reproductive: Status post hysterectomy. No adnexal  masses. Other: Free gas within the abdomen consistent with recent exploratory laparotomy. Midline skin staples are noted within the anterior abdominal wall. Small amount of gas extends into a fat containing right inguinal hernia. Trace pelvic free fluid.  No fluid collection or abscess. Musculoskeletal: No acute or destructive bony lesions. Reconstructed images demonstrate no additional findings. IMPRESSION: 1. Distended loops of small bowel, with transition in the distal jejunum right lower quadrant. Findings are consistent with small-bowel obstruction. 2. Interval development of left basilar airspace disease which could reflect pneumonia or aspiration. 3. Postsurgical changes from midline laparotomy. 4. Enteric catheter within the gastric lumen. Electronically Signed   By: Randa Ngo M.D.   On: 07/06/2019 02:48   DG INTRO LONG GI TUBE  Result Date: 07/05/2019 CLINICAL DATA:  84 year old female with history of abdominal distension. EXAM: FL FEEDING TUBE PLACEMENT CONTRAST:  None FLUOROSCOPY TIME:  Fluoroscopy Time:  36 seconds Radiation Exposure Index (if provided by the fluoroscopic device): 1.2 mGy Number of Acquired Spot Images: 0 COMPARISON:  None. FINDINGS: Intermittent fluoroscopy was utilized to pass a nasogastric tube into the stomach. Tip of the tube was  ultimately placed in the distal aspect of the stomach. IMPRESSION: 1. Successful fluoroscopic guided placement of nasogastric tube with tip in the distal stomach. Electronically Signed   By: Vinnie Langton M.D.   On: 07/05/2019 16:11      [name] 07/07/2019  Patient ID: Gloria Murphy, female   DOB: 1935-12-01, 84 y.o.   MRN: [id number]

## 2019-07-07 NOTE — Progress Notes (Signed)
PHARMACY - TOTAL PARENTERAL NUTRITION CONSULT NOTE   Indication: bowel obstruction  Patient Measurements: Height: 5\' 9"  (175.3 cm) Weight: 154 lb 8.7 oz (70.1 kg) IBW/kg (Calculated) : 66.2 TPN AdjBW (KG): 66.1 Body mass index is 22.82 kg/m.  Assessment: 84 yo woman with hx of pelvic carcinoid tumor, admitted with N/V, decreased po intake was found to have SBO and gastric distention.   Glucose / Insulin: hx of type II diabetes, controlled with diet. CBGs - range 124-139, 6 units insulin given in the past 24hrs Electrolytes:  Na WNL , Mag 1.8 (goal>2), phos low at 2.1, K 3.6 (goal>4) Renal: WNL LFTs / TGs: AST/ALT 38/48, TBili 1.2, TGs 108 Prealbumin / albumin: 16.9 (2/19), 12.1 (2/23) Intake / Output; MIVF: no IV fluids GI Imaging: 2/27 Abdominal CT: findings c/w SBO Surgeries / Procedures: 2/22 exploratory laparotomy (n/a) lysis of adhesions  Central access: PICC 2/22, pt pulled PICC out on 2/23 ~ 2100,  re-insert PICC 2/26 and restart TPN TPN start date: 2/22 -2/23, 2/26 >>   Nutritional Goals RD recs KCal:1700-1900 , Protein:85-100gm , Fluid: </= 2 L/day  Goal TPN rate is 75 mL/hr (provides 90 g of protein and 1818 kcals per day) Protein: 50g/L Lipids 30g/L Dextrose 15%  Current Nutrition:  boost ordered tid, pro-stat bid>>not taking either 2/28 Trial Clear Liquid Diet  Plan:  KPhos 55mMol IV x1 now  Increase TPN at 1800 to goal rate 75 ml/hr Electrolytes in TPN: 60mEq/L of Na, 85mEq/L of K, 70mEq/L of Ca, 38mEq/L of Mg, and 23 mmol/L of Phos. Cl:Ac ratio 1:1 Add trace elements to TPN daily and standard MVI on MWF only due to national backorder Add 15 units insulin to TPN  Continue SSI to q4h,  adjust as needed  IVF per MD, none at present Monitor TPN labs on Mon/Thurs F/u if tolerated diet/need for TPN  Netta Cedars, PharmD, BCPS 07/07/2019, 10:07 AM

## 2019-07-07 NOTE — Progress Notes (Signed)
PROGRESS NOTE    Gloria Murphy  [id number] DOB: 08-13-1935 DOA: 06/26/2019 PCP: Carlyle Basques, MD    Brief Narrative: 84 year old woman admitted from home on 2/17 due to nausea and vomiting with decreased p.o. intake. In the ED she was found to have a large small bowel obstruction and gastric distention. She was seen by surgery in the ED and NG tube was placed. She has a history of a pelvic carcinoid tumor for which she underwent exploratory laparotomy in April 2019 with bilateral salpingo-oophorectomy and omentectomy as well as partial hysterectomy. S/p OR on 2/22  Assessment & Plan:   Principal Problem:   SBO (small bowel obstruction) (HCC) Active Problems:   Malignant carcinoid tumor of unknown primary site [facility])   Type 2 diabetes mellitus without complication, without long-term current use of insulin (HCC)   AKI (acute kidney injury) (HCC)   Nausea and vomiting   Abdominal pain   Lactic acidosis   Elevated troponin   Sinus tachycardia  #1 small bowel obstruction status post exploratory laparotomy with lysis of adhesions 07/01/2019 with recurrence of SBO patient was made n.p.o. again NG tube placed again.  Fortunately she has done well with decreased abdominal distention and pain having bowel movements.  Reviewed surgery notes from today.  NG tube to be DC'd and patient to be started on clear liquid diet.  Continue TPN. Seen by PT recommends home health PT and rolling walker with 5 inch wheels. Husband wants to take her home with home health. CT abdomen-distended loops of small bowel with transition in the distal jejunum right lower quadrant findings consistent with small bowel obstruction with interval development of left basilar airspace disease which could reflect pneumonia or aspiration.  #2 history of pelvic carcinoid tumor followed by Dr. Denman George.  #3 AKI resolved likely secondary to intravascular volume depletion improved with IV fluids.  #4 type 2 diabetes-diet  controlled.  Not on any medications at home.  #5 hypokalemiapotassium  3.6 and magnesium 1.8 being repleted in the TPN.    #6 possible aspiration pneumonia patient has improved since starting Unasyn.  Continue Unasyn. Lactic acid 2.7 Procalcitonin 0.73 up to 1.28 Leukocytosis   resolved 8.4.   #7 hyperthyroidism review of her chart indicates she had T3 of 4.6 and T4 of 1.48 both elevated with a low TSH of 0.027 on 06/27/2019.  She was on methimazole as an outpatient by reviewing her chart.  I will restart methimazole 5 mg daily.    Nutrition Problem: Increased nutrient needs Etiology: acute illness, post-op healing     Signs/Symptoms: estimated needs    Interventions: Boost Breeze, Prostat, MVI  Estimated body mass index is 22.82 kg/m as calculated from the following:   Height as of this encounter: 5\' 9"  (1.753 m).   Weight as of this encounter: 70.1 kg. DVT prophylaxis:Lovenox Code Status:Full code Family Communication:None Disposition Plan:Patient came from home plan is to discharge her home once her bowel functions return. She is admitted with small bowel obstruction status post lysis of adhesions.. She has recurrent ileus or recurrent small bowel obstruction by KUB. PICC line placed for TPN Surgery following not ready for discharge yet   Consultants:  General surgery  Procedures:NG tube placed 06/26/2019 exploratory laparotomy with lysis of adhesions on 07/01/2019 Antimicrobials:None   Subjective: Patient is resting in bed she is awake and alert and answers my questions appropriately and followed commands.  She reports her abdominal pain is decreasing. Objective: Vitals:   07/07/19 0500 07/07/19 0600 07/07/19  0700 07/07/19 0834  BP: (!) 183/91 (!) 143/63 (!) 155/65   Pulse:   (!) 104   Resp: (!) 34 (!) 32 (!) 22   Temp:    97.9 F (36.6 C)  TempSrc:    Oral  SpO2:   99%   Weight:      Height:        Intake/Output Summary (Last 24 hours) at  07/07/2019 0933 Last data filed at 07/07/2019 0700 Gross per 24 hour  Intake 2006.91 ml  Output 900 ml  Net 1106.91 ml   Filed Weights   07/02/19 0534 07/04/19 0300 07/06/19 0500  Weight: 77 kg 75.6 kg 70.1 kg    Examination:  General exam: Appears calm and comfortable  Respiratory system: Clear to auscultation. Respiratory effort normal. Cardiovascular system: S1 & S2 heard, RRR. No JVD, murmurs, rubs, gallops or clicks. No pedal edema. Gastrointestinal system: Abdomen is distended, soft and decreased tender. No organomegaly or masses felt. Normal bowel sounds heard. Central nervous system: Alert and oriented. No focal neurological deficits. Extremities: 1+ pitting edema Skin: No rashes, lesions or ulcers Psychiatry: Judgement and insight appear normal. Mood & affect appropriate.     Data Reviewed: I have personally reviewed following labs and imaging studies  CBC: Recent Labs  Lab 07/02/19 0410 07/03/19 0424 07/05/19 1734 07/06/19 0153 07/07/19 0327  WBC 6.9 6.9 11.5* 11.8* 8.4  NEUTROABS 5.4  --   --   --   --   HGB 10.6* 11.2* 11.8* 10.4* 9.7*  HCT 33.8* 34.0* 36.5 31.8* 30.1*  MCV 82.2 80.0 81.5 80.3 81.4  PLT 158 135* 158 158 0000000   Basic Metabolic Panel: Recent Labs  Lab 07/03/19 0424 07/03/19 0424 07/04/19 0452 07/05/19 0348 07/05/19 1734 07/06/19 0153 07/07/19 0327  NA 136   < > 134* 133* 128* 134* 137  K 3.2*   < > 3.6 3.7 5.5* 4.0 3.6  CL 108   < > 105 101 99 105 109  CO2 20*   < > 20* 21* 18* 20* 19*  GLUCOSE 129*   < > 192* 305* 323* 125* 122*  BUN 10   < > 8 13 18 19 17   CREATININE 0.54   < > 0.63 0.68 0.82 0.64 0.60  CALCIUM 8.5*   < > 8.3* 8.5* 8.4* 8.3* 8.4*  MG 1.7  --  1.6* 1.7  --  2.1 1.8  PHOS 2.0*  --  1.6* 2.1*  --  2.6 2.1*   < > = values in this interval not displayed.   GFR: Estimated Creatinine Clearance: 57.6 mL/min (by C-G formula based on SCr of 0.6 mg/dL). Liver Function Tests: Recent Labs  Lab 07/02/19 0410  07/04/19 0452 07/05/19 0348 07/05/19 1734  AST 38 31 55* 38  ALT 31 35 56* 48*  ALKPHOS 64 86 91 84  BILITOT 1.3* 1.0 1.1 1.2  PROT 6.2* 6.3* 6.6 6.4*  ALBUMIN 2.5* 2.5* 2.5* 2.4*   No results for input(s): LIPASE, AMYLASE in the last 168 hours. No results for input(s): AMMONIA in the last 168 hours. Coagulation Profile: No results for input(s): INR, PROTIME in the last 168 hours. Cardiac Enzymes: No results for input(s): CKTOTAL, CKMB, CKMBINDEX, TROPONINI in the last 168 hours. BNP (last 3 results) No results for input(s): PROBNP in the last 8760 hours. HbA1C: No results for input(s): HGBA1C in the last 72 hours. CBG: Recent Labs  Lab 07/06/19 1540 07/06/19 2042 07/07/19 0007 07/07/19 0417 07/07/19 0754  GLUCAP 124*  128* 86 117* 139*   Lipid Profile: No results for input(s): CHOL, HDL, LDLCALC, TRIG, CHOLHDL, LDLDIRECT in the last 72 hours. Thyroid Function Tests: No results for input(s): TSH, T4TOTAL, FREET4, T3FREE, THYROIDAB in the last 72 hours. Anemia Panel: No results for input(s): VITAMINB12, FOLATE, FERRITIN, TIBC, IRON, RETICCTPCT in the last 72 hours. Sepsis Labs: Recent Labs  Lab 07/05/19 1734 07/05/19 2037 07/06/19 0153 07/07/19 0327  PROCALCITON 0.73  --  1.28 0.99  LATICACIDVEN 2.7* 2.7*  --   --     Recent Results (from the past 240 hour(s))  MRSA PCR Screening     Status: None   Collection Time: 07/05/19  3:59 PM   Specimen: Nasal Mucosa; Nasopharyngeal  Result Value Ref Range Status   MRSA by PCR NEGATIVE NEGATIVE Final    Comment:        The GeneXpert MRSA Assay (FDA approved for NASAL specimens only), is one component of a comprehensive MRSA colonization surveillance program. It is not intended to diagnose MRSA infection nor to guide or monitor treatment for MRSA infections. Performed at [facility], [place] [address] [place], [place] [address]   Culture, blood (routine x 2)     Status: None (Preliminary result)    Collection Time: 07/05/19  5:34 PM   Specimen: BLOOD LEFT FOREARM  Result Value Ref Range Status   Specimen Description   Final    BLOOD LEFT FOREARM Performed at [place] [address] [place], [place] [address]    Special Requests   Final    BOTTLES DRAWN AEROBIC ONLY Blood Culture adequate volume Performed at [place] [address], [place], [place] [address]    Culture   Final    NO GROWTH 2 DAYS Performed at [place] [address], [place], [place] [address]    Report Status PENDING  Incomplete  Culture, blood (routine x 2)     Status: None (Preliminary result)   Collection Time: 07/05/19  5:34 PM   Specimen: BLOOD  Result Value Ref Range Status   Specimen Description   Final    BLOOD LEFT ANTECUBITAL Performed at [place] [address] [place], [place] [address]    Special Requests   Final    BOTTLES DRAWN AEROBIC ONLY Blood Culture adequate volume Performed at [place] [address] [place], [place] [address]    Culture   Final    NO GROWTH 2 DAYS Performed at [place] [address] [place], [place] [address]    Report Status PENDING  Incomplete         Radiology Studies: CT ABDOMEN PELVIS W CONTRAST  Result Date: 07/06/2019 CLINICAL DATA:  Small-bowel obstruction status post lysis of adhesions and exploratory laparotomy 07/01/2019, nausea and abdominal distension after removal of enteric catheter EXAM: CT ABDOMEN AND PELVIS WITH CONTRAST TECHNIQUE: Multidetector CT imaging of the abdomen and pelvis was performed using the standard protocol following bolus administration of intravenous contrast. CONTRAST:  164mL OMNIPAQUE IOHEXOL 300 MG/ML  SOLN COMPARISON:  07/05/2019, 06/26/2019 FINDINGS: Lower chest: There is been interval development of patchy airspace disease within the lingula and left lower lobe. Differential includes infection or  aspiration. Trace bilateral pleural effusions. Hepatobiliary: Gallbladder is decompressed, without evidence of cholelithiasis or cholecystitis. Liver is unremarkable. Pancreas: Unremarkable. No pancreatic ductal dilatation or surrounding inflammatory changes. Spleen: Normal in size without focal abnormality. Adrenals/Urinary Tract: Kidneys are unremarkable. No  obstructive uropathy. Gas in the bladder lumen is likely due to recent catheterization. No filling defects. The adrenals are unremarkable. Stomach/Bowel: Enteric catheter extends to the gastric antrum. Multiple dilated loops of small bowel are identified, measuring up to 3.2 cm in diameter. Transition from dilated to nondilated bowel is seen within the distal jejunum in the right lower quadrant, with decompressed terminal ileum. There is a normal appendix. Vascular/Lymphatic: Minimal atherosclerosis of the abdominal aorta unchanged. Enlarged mesenteric lymph node right lower quadrant measures up to 14 mm in short axis, not significantly changed since prior study. No other pathologic adenopathy. Reproductive: Status post hysterectomy. No adnexal masses. Other: Free gas within the abdomen consistent with recent exploratory laparotomy. Midline skin staples are noted within the anterior abdominal wall. Small amount of gas extends into a fat containing right inguinal hernia. Trace pelvic free fluid.  No fluid collection or abscess. Musculoskeletal: No acute or destructive bony lesions. Reconstructed images demonstrate no additional findings. IMPRESSION: 1. Distended loops of small bowel, with transition in the distal jejunum right lower quadrant. Findings are consistent with small-bowel obstruction. 2. Interval development of left basilar airspace disease which could reflect pneumonia or aspiration. 3. Postsurgical changes from midline laparotomy. 4. Enteric catheter within the gastric lumen. Electronically Signed   By: Randa Ngo M.D.   On: 07/06/2019 02:48    DG INTRO LONG GI TUBE  Result Date: 07/05/2019 CLINICAL DATA:  84 year old female with history of abdominal distension. EXAM: FL FEEDING TUBE PLACEMENT CONTRAST:  None FLUOROSCOPY TIME:  Fluoroscopy Time:  36 seconds Radiation Exposure Index (if provided by the fluoroscopic device): 1.2 mGy Number of Acquired Spot Images: 0 COMPARISON:  None. FINDINGS: Intermittent fluoroscopy was utilized to pass a nasogastric tube into the stomach. Tip of the tube was ultimately placed in the distal aspect of the stomach. IMPRESSION: 1. Successful fluoroscopic guided placement of nasogastric tube with tip in the distal stomach. Electronically Signed   By: Vinnie Langton M.D.   On: 07/05/2019 16:11        Scheduled Meds: . acetaminophen  650 mg Oral Q6H  . chlorhexidine  15 mL Mouth Rinse BID  . Chlorhexidine Gluconate Cloth  6 each Topical Daily  . docusate sodium  100 mg Oral BID  . feeding supplement  1 Container Oral TID BM  . feeding supplement (PRO-STAT SUGAR FREE 64)  30 mL Oral BID  . insulin aspart  0-15 Units Subcutaneous Q4H  . mouth rinse  15 mL Mouth Rinse q12n4p  . methocarbamol  500 mg Oral Q8H  . metoCLOPramide (REGLAN) injection  5 mg Intravenous Q8H  . metoprolol tartrate  5 mg Intravenous Q6H  . pantoprazole (PROTONIX) IV  40 mg Intravenous Q24H  . polyvinyl alcohol  1 drop Both Eyes TID  . scopolamine  1 patch Transdermal Q72H  . sodium chloride flush  10-40 mL Intracatheter Q12H  . sodium chloride flush  3 mL Intravenous Once  . sodium chloride flush  3 mL Intravenous Q12H   Continuous Infusions: . ampicillin-sulbactam (UNASYN) IV Stopped (07/07/19 0711)  . TPN ADULT (ION) 60 mL/hr at 07/07/19 0700     LOS: 11 days     Georgette Shell, MD  07/07/2019, 9:33 AM

## 2019-07-08 ENCOUNTER — Inpatient Hospital Stay (HOSPITAL_COMMUNITY): Payer: Medicare Other

## 2019-07-08 LAB — DIFFERENTIAL
Abs Immature Granulocytes: 0.15 10*3/uL — ABNORMAL HIGH (ref 0.00–0.07)
Basophils Absolute: 0 10*3/uL (ref 0.0–0.1)
Basophils Relative: 1 %
Eosinophils Absolute: 0.1 10*3/uL (ref 0.0–0.5)
Eosinophils Relative: 1 %
Immature Granulocytes: 2 %
Lymphocytes Relative: 9 %
Lymphs Abs: 0.6 10*3/uL — ABNORMAL LOW (ref 0.7–4.0)
Monocytes Absolute: 0.5 10*3/uL (ref 0.1–1.0)
Monocytes Relative: 7 %
Neutro Abs: 5.3 10*3/uL (ref 1.7–7.7)
Neutrophils Relative %: 80 %

## 2019-07-08 LAB — COMPREHENSIVE METABOLIC PANEL
ALT: 25 U/L (ref 0–44)
AST: 25 U/L (ref 15–41)
Albumin: 2 g/dL — ABNORMAL LOW (ref 3.5–5.0)
Alkaline Phosphatase: 129 U/L — ABNORMAL HIGH (ref 38–126)
Anion gap: 7 (ref 5–15)
BUN: 18 mg/dL (ref 8–23)
CO2: 19 mmol/L — ABNORMAL LOW (ref 22–32)
Calcium: 7.9 mg/dL — ABNORMAL LOW (ref 8.9–10.3)
Chloride: 109 mmol/L (ref 98–111)
Creatinine, Ser: 0.55 mg/dL (ref 0.44–1.00)
GFR calc Af Amer: >60 mL/min (ref >60)
GFR calc non Af Amer: >60 mL/min (ref >60)
Glucose, Bld: 112 mg/dL — ABNORMAL HIGH (ref 70–99)
Potassium: 3.6 mmol/L (ref 3.5–5.1)
Sodium: 135 mmol/L (ref 135–145)
Total Bilirubin: 0.6 mg/dL (ref 0.3–1.2)
Total Protein: 5.6 g/dL — ABNORMAL LOW (ref 6.5–8.1)

## 2019-07-08 LAB — PREALBUMIN: Prealbumin: 7.6 mg/dL — ABNORMAL LOW (ref 18–38)

## 2019-07-08 LAB — CBC
HCT: 26.4 % — ABNORMAL LOW (ref 36.0–46.0)
Hemoglobin: 8.5 g/dL — ABNORMAL LOW (ref 12.0–15.0)
MCH: 25.8 pg — ABNORMAL LOW (ref 26.0–34.0)
MCHC: 32.2 g/dL (ref 30.0–36.0)
MCV: 80 fL (ref 80.0–100.0)
Platelets: 242 10*3/uL (ref 150–400)
RBC: 3.3 MIL/uL — ABNORMAL LOW (ref 3.87–5.11)
RDW: 17.4 % — ABNORMAL HIGH (ref 11.5–15.5)
WBC: 6.6 10*3/uL (ref 4.0–10.5)
nRBC: 0 % (ref 0.0–0.2)

## 2019-07-08 LAB — GLUCOSE, CAPILLARY
Glucose-Capillary: 114 mg/dL — ABNORMAL HIGH (ref 70–99)
Glucose-Capillary: 96 mg/dL (ref 70–99)
Glucose-Capillary: 92 mg/dL (ref 70–99)
Glucose-Capillary: 116 mg/dL — ABNORMAL HIGH (ref 70–99)
Glucose-Capillary: 127 mg/dL — ABNORMAL HIGH (ref 70–99)

## 2019-07-08 LAB — PHOSPHORUS: Phosphorus: 3.2 mg/dL (ref 2.5–4.6)

## 2019-07-08 LAB — MAGNESIUM: Magnesium: 1.6 mg/dL — ABNORMAL LOW (ref 1.7–2.4)

## 2019-07-08 LAB — TRIGLYCERIDES: Triglycerides: 74 mg/dL (ref <150)

## 2019-07-08 MED ORDER — TRAVASOL 10 % IV SOLN
INTRAVENOUS | Status: AC
  Filled 2019-07-08: qty 900

## 2019-07-08 MED ORDER — MAGNESIUM SULFATE 2 GM/50ML IV SOLN
2.0000 g | Freq: Once | INTRAVENOUS | Status: AC
  Administered 2019-07-08: 2 g via INTRAVENOUS
  Filled 2019-07-08: qty 50

## 2019-07-08 MED ORDER — LORAZEPAM 2 MG/ML IJ SOLN
0.2500 mg | Freq: Once | INTRAMUSCULAR | Status: AC
  Administered 2019-07-08: 0.25 mg via INTRAVENOUS
  Filled 2019-07-08: qty 1

## 2019-07-08 MED ORDER — POTASSIUM CHLORIDE 10 MEQ/50ML IV SOLN
10.0000 meq | INTRAVENOUS | Status: AC
  Administered 2019-07-08 (×4): 10 meq via INTRAVENOUS
  Filled 2019-07-08 (×4): qty 50

## 2019-07-08 MED ORDER — LABETALOL HCL 5 MG/ML IV SOLN
5.0000 mg | Freq: Four times a day (QID) | INTRAVENOUS | Status: DC | PRN
  Administered 2019-07-08 – 2019-07-10 (×3): 5 mg via INTRAVENOUS
  Filled 2019-07-08 (×3): qty 4

## 2019-07-08 MED ORDER — ENOXAPARIN SODIUM 40 MG/0.4ML ~~LOC~~ SOLN
40.0000 mg | SUBCUTANEOUS | Status: DC
  Administered 2019-07-08 – 2019-07-23 (×16): 40 mg via SUBCUTANEOUS
  Filled 2019-07-08 (×16): qty 0.4

## 2019-07-08 MED ORDER — FUROSEMIDE 10 MG/ML IJ SOLN
40.0000 mg | Freq: Once | INTRAMUSCULAR | Status: AC
  Administered 2019-07-08: 11:00:00 40 mg via INTRAVENOUS
  Filled 2019-07-08: qty 4

## 2019-07-08 NOTE — Progress Notes (Signed)
Physical Therapy Treatment Patient Details Name: Gloria Murphy MRN: [id number] DOB: 1935/12/17 Today's Date: 07/08/2019    History of Present Illness 84 year old woman admitted from home on 2/17 due to nausea and vomiting with decreased p.o. intake. She has a history of a pelvic carcinoid tumor for which she underwent exploratory laparotomy in April 2019 with bilateral salpingo-oophorectomy and omentectomy as well as partial hysterectomy  In the ED she was found to have a large small bowel obstruction and gastric distention.  NG tube was placed, exploratory laparotomy 2/22.     PT Comments    The patient is confused, requires 2 assist for mobilizing to [facility] then to recliner.  Continue PT for increased mobility as tolerated.   Follow Up Recommendations  Supervision/Assistance - 24 hour;SNF     Equipment Recommendations  None recommended by PT    Recommendations for Other Services       Precautions / Restrictions Precautions Precautions: Fall Restrictions Weight Bearing Restrictions: No    Mobility  Bed Mobility Overal bed mobility: Needs Assistance Bed Mobility: Supine to Sit   Sidelying to sit: Max assist;+2 for physical assistance       General bed mobility comments: cues for reaching for bed rail to self assist; allowed pt to initiate and request assist as needed; assist mostly for trunk due to pain  Transfers Overall transfer level: Needs assistance Equipment used: Rolling walker (2 wheeled) Transfers: Sit to/from [facility] Sit to Stand: Mod assist;+2 physical assistance Stand pivot transfers: Mod assist;+2 physical assistance       General transfer comment: Needed A to steer RW as well.  Ambulation/Gait                 Stairs             Wheelchair Mobility    Modified Rankin (Stroke Patients Only)       Balance Overall balance assessment: Needs assistance Sitting-balance support: Bilateral upper extremity  supported;Feet supported Sitting balance-Leahy Scale: Poor Sitting balance - Comments: brief moments of min guard but overall min A Postural control: Posterior lean Standing balance support: Bilateral upper extremity supported Standing balance-Leahy Scale: Poor Standing balance comment: "I feel like my feet are sliding out on me"                            Cognition Arousal/Alertness: Awake/alert Behavior During Therapy: Flat affect Overall Cognitive Status: No family/caregiver present to determine baseline cognitive functioning Area of Impairment: Orientation;Following commands;Problem solving                 Orientation Level: Disoriented to;Place;Time     Following Commands: Follows one step commands inconsistently     Problem Solving: Slow processing;Decreased initiation;Difficulty sequencing;Requires verbal cues;Requires tactile cues General Comments: Asked her to sit up a couple of times but she did not initate coming to side of bed until we started to A her with moving her legs      Exercises      General Comments        Pertinent Vitals/Pain Pain Assessment: Faces Faces Pain Scale: Hurts even more Pain Location: right shoulder Pain Descriptors / Indicators: Guarding;Grimacing Pain Intervention(s): Monitored during session    Home Living                      Prior Function            PT Goals (current  goals can now be found in the care plan section) Acute Rehab PT Goals Patient Stated Goal: to use the bathroom Progress towards PT goals: Progressing toward goals    Frequency    Min 2X/week      PT Plan Current plan remains appropriate    Co-evaluation PT/OT/SLP Co-Evaluation/Treatment: Yes Reason for Co-Treatment: For patient/therapist safety PT goals addressed during session: Mobility/safety with mobility OT goals addressed during session: Strengthening/ROM      AM-PAC PT "6 Clicks" Mobility   Outcome Measure   Help needed turning from your back to your side while in a flat bed without using bedrails?: A Lot Help needed moving from lying on your back to sitting on the side of a flat bed without using bedrails?: A Lot Help needed moving to and from a bed to a chair (including a wheelchair)?: A Lot Help needed standing up from a chair using your arms (e.g., wheelchair or bedside chair)?: A Lot Help needed to walk in hospital room?: Total Help needed climbing 3-5 steps with a railing? : Total 6 Click Score: 10    End of Session Equipment Utilized During Treatment: Gait belt Activity Tolerance: Patient limited by fatigue Patient left: in chair;with call bell/phone within reach;with chair alarm set Nurse Communication: Mobility status PT Visit Diagnosis: Other abnormalities of gait and mobility (R26.89)     Time: [id number] PT Time Calculation (min) (ACUTE ONLY): 32 min  Charges:  $Therapeutic Activity: 8-22 mins                     [place] Pager [phone] Office [phone]    Claretha Cooper 07/08/2019, 2:59 PM

## 2019-07-08 NOTE — Progress Notes (Signed)
     Assessment & Plan: POD#7 SBO-EXPLORATORY LAPAROTOMYw/LYSIS OF ADHESIONS2/22 Dr. Marlou Starks No BM's overnight per nursing  NPO due to possible dysphagia - Speech Path to evaluate  Continue TNA  Possible aspiration event - per medical service  DM Protein calorie Malnutrition - prealbumin 12.1 (2/23) H/o Carcinoid tumor s/p ex lapwith bilateral salpingo-oophorectomy, omentectomy and radical debulkingfor suspected ovarian cancer 08/31/2017 Dr. Denman George  VTE -SCDs, ok for chemical DVT prophylaxis from surgical standpoint Follow up -Dr. Sid Falcon, [facility]       Office: [phone]   Chief Complaint: SBO  Subjective: Patient in bed, responsive to voice, mild confusion.  Denies pain.  Objective: Vital signs in last 24 hours: Temp:  [98.5 F (36.9 C)-101.9 F (38.8 C)] 101.9 F (38.8 C) (03/01 0800) Pulse Rate:  [79-103] 88 (03/01 0800) Resp:  [27-39] 31 (03/01 0800) BP: (140-191)/(60-89) 176/71 (03/01 0800) SpO2:  [99 %-100 %] 100 % (03/01 0800) Weight:  [70.1 kg] 70.1 kg (03/01 0500) Last BM Date: 07/07/19  Intake/Output from previous day: 02/28 0701 - 03/01 0700 In: 2301.2 [I.V.:1530.5; IV Piggyback:770.8] Out: 920 [Urine:920] Intake/Output this shift: No intake/output data recorded.  Physical Exam: HEENT - sclerae clear, mucous membranes moist Neck - soft Abdomen - mild distension; BS present; midline wound dry and intact  Lab Results:  Recent Labs    07/07/19 0327 07/08/19 0500  WBC 8.4 6.6  HGB 9.7* 8.5*  HCT 30.1* 26.4*  PLT 195 242   BMET Recent Labs    07/07/19 0327 07/08/19 0500  NA 137 135  K 3.6 3.6  CL 109 109  CO2 19* 19*  GLUCOSE 122* 112*  BUN 17 18  CREATININE 0.60 0.55  CALCIUM 8.4* 7.9*   PT/INR No results for input(s): LABPROT, INR in the last 72 hours. Comprehensive Metabolic Panel:    Component Value Date/Time   NA 135 07/08/2019 0500   NA 137  07/07/2019 0327   NA 142 02/26/2019 1618   NA 141 02/04/2019 1719   K 3.6 07/08/2019 0500   K 3.6 07/07/2019 0327   CL 109 07/08/2019 0500   CL 109 07/07/2019 0327   CO2 19 (L) 07/08/2019 0500   CO2 19 (L) 07/07/2019 0327   BUN 18 07/08/2019 0500   BUN 17 07/07/2019 0327   BUN 19 02/26/2019 1618   BUN 15 02/04/2019 1719   CREATININE 0.55 07/08/2019 0500   CREATININE 0.60 07/07/2019 0327   GLUCOSE 112 (H) 07/08/2019 0500   GLUCOSE 122 (H) 07/07/2019 0327   CALCIUM 7.9 (L) 07/08/2019 0500   CALCIUM 8.4 (L) 07/07/2019 0327   AST 25 07/08/2019 0500   AST 38 07/05/2019 1734   ALT 25 07/08/2019 0500   ALT 48 (H) 07/05/2019 1734   ALKPHOS 129 (H) 07/08/2019 0500   ALKPHOS 84 07/05/2019 1734   BILITOT 0.6 07/08/2019 0500   BILITOT 1.2 07/05/2019 1734   BILITOT 0.4 02/04/2019 1719   PROT 5.6 (L) 07/08/2019 0500   PROT 6.4 (L) 07/05/2019 1734   PROT 7.7 02/04/2019 1719   ALBUMIN 2.0 (L) 07/08/2019 0500   ALBUMIN 2.4 (L) 07/05/2019 1734   ALBUMIN 4.1 02/04/2019 1719    Studies/Results: No results found.    Gloria Murphy 07/08/2019  Patient ID: Gloria Murphy, female   DOB: [birth year], 84 y.o.   MRN: [id number]

## 2019-07-08 NOTE — Progress Notes (Signed)
Occupational Therapy Treatment Patient Details Name: Gloria Murphy MRN: [id number] DOB: 10-07-1935 Today's Date: 07/08/2019    History of present illness 84 year old woman admitted from home on 2/17 due to nausea and vomiting with decreased p.o. intake. She has a history of a pelvic carcinoid tumor for which she underwent exploratory laparotomy in April 2019 with bilateral salpingo-oophorectomy and omentectomy as well as partial hysterectomy  In the ED she was found to have a large small bowel obstruction and gastric distention.  NG tube was placed, exploratory laparotomy 2/22.    OT comments  This 84 yo female admitted with above seen in conjunction with PT today to see if we could progress mobility with patient; however we were unable to due to urination frequency and increase RR into 40's. Pt is very weak is not tolerating much activity. She was max A+2 bed mobility, Mod A +2 stand turn to recliner/BSC. She will continue to benefit from acute OT with follow up now recommended for SNF.  Follow Up Recommendations  SNF;Supervision/Assistance - 24 hour    Equipment Recommendations  3 in 1 bedside commode       Precautions / Restrictions Precautions Precautions: Fall Restrictions Weight Bearing Restrictions: No       Mobility Bed Mobility Overal bed mobility: Needs Assistance Bed Mobility: Supine to Sit   Sidelying to sit: Max assist;+2 for physical assistance(A for legs and trunk and to scoot around to EOB)          Transfers Overall transfer level: Needs assistance Equipment used: Rolling walker (2 wheeled) Transfers: Sit to/from [facility] Sit to Stand: Mod assist;+2 physical assistance Stand pivot transfers: Mod assist;+2 physical assistance       General transfer comment: Needed A to steer RW as well.    Balance Overall balance assessment: Needs assistance Sitting-balance support: Bilateral upper extremity supported;Feet supported Sitting  balance-Leahy Scale: Poor Sitting balance - Comments: brief moments of min guard but overall min A Postural control: Posterior lean Standing balance support: Bilateral upper extremity supported Standing balance-Leahy Scale: Poor                             ADL either performed or assessed with clinical judgement   ADL Overall ADL's : Needs assistance/impaired     Grooming: Wash/dry face;Set up;Supervision/safety;Standing Grooming Details (indicate cue type and reason): in recliner                 Toilet Transfer: Moderate assistance;+2 for physical assistance;RW;Stand-pivot;BSC   Toileting- Clothing Manipulation and Hygiene: Total assistance Toileting - Clothing Manipulation Details (indicate cue type and reason): Mod A sit<>stand             Vision Baseline Vision/History: Wears glasses Wears Glasses: At all times Patient Visual Report: No change from baseline            Cognition Arousal/Alertness: Awake/alert Behavior During Therapy: Flat affect Overall Cognitive Status: No family/caregiver present to determine baseline cognitive functioning Area of Impairment: Orientation;Following commands;Problem solving                 Orientation Level: Disoriented to;Place;Time(did no know where she was, 1979, April)     Following Commands: Follows one step commands inconsistently     Problem Solving: Slow processing;Decreased initiation;Difficulty sequencing;Requires verbal cues;Requires tactile cues General Comments: Asked her to sit up a couple of times but she did not initate coming to side of bed until we started  to A her with moving her legs                   Pertinent Vitals/ Pain       Pain Assessment: No/denies pain         Frequency  Min 2X/week        Progress Toward Goals  OT Goals(current goals can now be found in the care plan section)  Progress towards OT goals: Not progressing toward goals - comment(weaker than  when last seen)  Acute Rehab OT Goals Patient Stated Goal: to use the bathroom OT Goal Formulation: With patient Time For Goal Achievement: 07/16/19 Potential to Achieve Goals: [place] Discharge plan needs to be updated    Co-evaluation    PT/OT/SLP Co-Evaluation/Treatment: Yes Reason for Co-Treatment: For patient/therapist safety;To address functional/ADL transfers PT goals addressed during session: Mobility/safety with mobility;Balance;Strengthening/ROM OT goals addressed during session: Strengthening/ROM;ADL's and self-care      AM-PAC OT "6 Clicks" Daily Activity     Outcome Measure   Help from another person eating meals?: Total Help from another person taking care of personal grooming?: A Lot Help from another person toileting, which includes using toliet, bedpan, or urinal?: Total Help from another person bathing (including washing, rinsing, drying)?: A Lot Help from another person to put on and taking off regular upper body clothing?: A Lot Help from another person to put on and taking off regular lower body clothing?: Total 6 Click Score: 6    End of Session Equipment Utilized During Treatment: Gait belt;Rolling walker  OT Visit Diagnosis: Unsteadiness on feet (R26.81);Other abnormalities of gait and mobility (R26.89);Muscle weakness (generalized) (M62.81);Other symptoms and signs involving cognitive function   Activity Tolerance Patient limited by fatigue   Patient Left in chair;with chair alarm set;with call bell/phone within reach   Nurse Communication Mobility status        Time: 0828-0903 OT Time Calculation (min): 35 min  Charges: OT General Charges $OT Visit: 1 Visit OT Treatments $Self Care/Home Management : 8-22 mins  CathyOTR/L Acute Rehab Services Pager [phone] Office [phone]      07/08/2019, 12:48 PM

## 2019-07-08 NOTE — Progress Notes (Signed)
PHARMACY - TOTAL PARENTERAL NUTRITION CONSULT NOTE   Indication: bowel obstruction  Patient Measurements: Height: '5\' 9"'$  (175.3 cm) Weight: 154 lb 8.7 oz (70.1 kg) IBW/kg (Calculated) : 66.2 TPN AdjBW (KG): 66.1 Body mass index is 22.82 kg/m.  Assessment: 84 yo woman with hx of pelvic carcinoid tumor, admitted with N/V, decreased po intake was found to have SBO and gastric distention.  Glucose / Insulin: hx of type II diabetes, controlled with diet. CBGs - range 114-139, 15 units insulin added to TPN yesterday- CBGs 97-114 after TPN w/ insulin hung,  4 units insulin given in the past 24hrs,  Electrolytes: Mag 1.6 (goal>2), phos up to 3.2 after 20 mMol K phos given,  K 3.6  (goal>4) Renal: WNL LFTs / TGs: AST/ALT WNL, Alk phos 129, TBili WNL, TGs 74 Prealbumin / albumin: 16.9> 12.1> 7.6 (3/1)  Intake / Output; MIVF: no IV fluids GI Imaging: 2/27 Abdominal CT: findings c/w SBO Surgeries / Procedures: 2/22 exploratory laparotomy (n/a) lysis of adhesions  Central access: PICC 2/22, pt pulled PICC out on 2/23 ~ 2100,  re-insert PICC 2/26 and restart TPN TPN start date: 2/22 -2/23, 2/26 >>   Nutritional Goals RD recs KCal:1700-1900 , Protein:85-100gm , Fluid: </= 2 L/day  Goal TPN rate is 75 mL/hr (provides 90 g of protein and 1818 kcals per day) Protein: 50g/L Lipids 30g/L Dextrose 15%  Current Nutrition:  boost ordered tid, pro-stat bid>>not taking either 2/28 Trial Clear Liquid Diet>>back to NPO 2/28 2nd unable to swallow, TPN at goal rate  Plan:  Now: Mag 2 gm per MD 4 runs KCL At 1800: Continue TPN at goal rate 75 ml/hr Electrolytes in TPN: 579mq/L of Na, 644m/L of K, 79m379mL of Ca, 6mE39m of Mg, and 23 mmol/L of Phos. Cl:Ac ratio 1:1 Add trace elements to TPN daily and standard MVI on MWF only due to national backorder Continue 15 units insulin in TPN  Continue SSI to q4h,  adjust as needed  IVF per MD, none at present Monitor TPN labs on Mon/Thurs, BMET, Mg & Phos in  AM F/u ability to take POs/enteral feeding  [email or web address] [email or web address] 07/08/2019 7:34 AM

## 2019-07-08 NOTE — Progress Notes (Signed)
PROGRESS NOTE    Gloria Murphy  [id number] DOB: 12-Mar-1936 DOA: 06/26/2019 PCP: Carlyle Basques, MD    Brief Narrative: 84 year old woman admitted from home on 2/17 due to nausea and vomiting with decreased p.o. intake. In the ED she was found to have a large small bowel obstruction and gastric distention. She was seen by surgery in the ED and NG tube was placed. She has a history of a pelvic carcinoid tumor for which she underwent exploratory laparotomy in April 2019 with bilateral salpingo-oophorectomy and omentectomy as well as partial hysterectomy.  She was taken to the OR on 07/01/2019 and had lysis of adhesions with recurrence of small bowel obstruction.  NG tube was placed back in again patient improved NG tube was taken out on 07/07/2019. Patient was moved to the stepdown unit as she became very tachypneic tachycardic hypotensive and febrile with recurrence of small bowel obstruction.  She was started on TPN.  07/08/2019-spiking temp again 101.9 blood pressure 168/ 105 on Unasyn repeat blood cultures done staff concerned about aspiration so kept her n.p.o. even though she was ordered to have clear liquid diet Having ventricular bigeminy with low mag and K of C3.6 NG tube out 07/07/2019 No nausea vomiting overnight no BM overnight  Assessment & Plan:   Principal Problem:   SBO (small bowel obstruction) (HCC) Active Problems:   Malignant carcinoid tumor of unknown primary site [facility])   Type 2 diabetes mellitus without complication, without long-term current use of insulin (HCC)   AKI (acute kidney injury) (HCC)   Nausea and vomiting   Abdominal pain   Lactic acidosis   Elevated troponin   Sinus tachycardia  #1  Recurrent small bowel obstruction status post exploratory laparotomy with lysis of adhesions 07/01/2019   Patient was due to be started on clear liquids yesterday but staff was concerned that she is aspirating so she did not take anything through her mouth.  Speech  consulted.  Meanwhile continue TPN.  Seen by PT recommends home health PT and rolling walker with 5 inch wheels. Husband wants to take her home with home health.  Plan was for her to go home with her husband when she was ready to be discharged however staff tells me that patient's husband almost had an unresponsive episode yesterday while he was visiting with her.  CT abdomen 2/27-distended loops of small bowel with transition in the distal jejunum right lower quadrant findings consistent with small bowel obstruction with interval development of left basilar airspace disease which could reflect pneumonia or aspiration.  #2 history of pelvic carcinoid tumor followed by Dr. Denman George.  #3 AKI resolved likely secondary to intravascular volume depletion improved with IV fluids.  #4 type 2 diabetes-diet controlled.  Not on any medications at home.  #5 hypokalemiapotassium 3.6 and magnesium 1.6 being repleted in the TPN.  I have ordered for 1 dose of mag sulfate bolus since she is having ventricular bigeminy. Replete potassium  #6 possible aspiration pneumonia-on Unasyn started to spike temp again last night.  Blood cultures repeated.   Chest x-ray 07/08/2019.Vague opacities throughout the left lung and at the right base could reflect pneumonia.  Mild bronchitic changes Lactic acid 2.7 Procalcitonin 0.73 up to 1.28 Leukocytosis  resolved 6.6  #7 hyperthyroidism review of her chart indicates she had T3 of 4.6 and T4 of 1.48 both elevated with a low TSH of 0.027 on 06/27/2019.  She was on methimazole as an outpatient by reviewing her chart.  I will restart methimazole  5 mg daily    Nutrition Problem: Increased nutrient needs Etiology: acute illness, post-op healing     Signs/Symptoms: estimated needs    Interventions: Boost Breeze, Prostat, MVI  Estimated body mass index is 22.82 kg/m as calculated from the following:   Height as of this encounter: 5\' 9"  (1.753 m).   Weight  as of this encounter: 70.1 kg.  DVT prophylaxis:Lovenox Code Status:Full code Family Communication:None Disposition Plan:Patient came from home plan was to discharge her home once her bowel functions return.  However she has become increasingly weaker with recurrent bowel obstruction.  Might need SNF by the time she is ready to be discharged. She is admitted with small bowel obstruction status post lysis of adhesions.   Consultants:  General surgery  Procedures:NG tube placed 06/26/2019 exploratory laparotomy with lysis of adhesions on 07/01/2019 Antimicrobials:None  Subjective: Resting in bed says she feels better  Objective: Vitals:   07/08/19 0600 07/08/19 0700 07/08/19 0800 07/08/19 0900  BP: (!) 172/67 (!) 169/74 (!) 176/71 (!) 166/135  Pulse: 86 92 88   Resp: (!) 35 (!) 31 (!) 31 20  Temp:   (!) 101.9 F (38.8 C)   TempSrc:   Oral   SpO2: 100% 99% 100%   Weight:      Height:        Intake/Output Summary (Last 24 hours) at 07/08/2019 0924 Last data filed at 07/08/2019 0641 Gross per 24 hour  Intake 2301.23 ml  Output 920 ml  Net 1381.23 ml   Filed Weights   07/04/19 0300 07/06/19 0500 07/08/19 0500  Weight: 75.6 kg 70.1 kg 70.1 kg    Examination:  General exam: Appears calm and comfortable  Respiratory system: Scattered rhonchi to auscultation. Respiratory effort normal. Cardiovascular system: S1 & S2 heard, RRR. No JVD, murmurs, rubs, gallops or clicks. No pedal edema. Gastrointestinal system: Abdomen is nondistended, soft and very tender to gentle touch  No organomegaly or masses felt. Normal bowel sounds heard. Central nervous system: Alert and oriented. No focal neurological deficits. Extremities: 2+ edema Skin: No rashes, lesions or ulcers Psychiatry: Judgement and insight appear normal. Mood & affect appropriate.     Data Reviewed: I have personally reviewed following labs and imaging studies  CBC: Recent Labs  Lab 07/02/19 0410  07/02/19 0410 07/03/19 0424 07/05/19 1734 07/06/19 0153 07/07/19 0327 07/08/19 0500  WBC 6.9   < > 6.9 11.5* 11.8* 8.4 6.6  NEUTROABS 5.4  --   --   --   --   --  5.3  HGB 10.6*   < > 11.2* 11.8* 10.4* 9.7* 8.5*  HCT 33.8*   < > 34.0* 36.5 31.8* 30.1* 26.4*  MCV 82.2   < > 80.0 81.5 80.3 81.4 80.0  PLT 158   < > 135* 158 158 195 242   < > = values in this interval not displayed.   Basic Metabolic Panel: Recent Labs  Lab 07/04/19 0452 07/04/19 0452 07/05/19 0348 07/05/19 1734 07/06/19 0153 07/07/19 0327 07/08/19 0500  NA 134*   < > 133* 128* 134* 137 135  K 3.6   < > 3.7 5.5* 4.0 3.6 3.6  CL 105   < > 101 99 105 109 109  CO2 20*   < > 21* 18* 20* 19* 19*  GLUCOSE 192*   < > 305* 323* 125* 122* 112*  BUN 8   < > 13 18 19 17 18   CREATININE 0.63   < > 0.68 0.82 0.64 0.60  0.55  CALCIUM 8.3*   < > 8.5* 8.4* 8.3* 8.4* 7.9*  MG 1.6*  --  1.7  --  2.1 1.8 1.6*  PHOS 1.6*  --  2.1*  --  2.6 2.1* 3.2   < > = values in this interval not displayed.   GFR: Estimated Creatinine Clearance: 57.6 mL/min (by C-G formula based on SCr of 0.55 mg/dL). Liver Function Tests: Recent Labs  Lab 07/02/19 0410 07/04/19 0452 07/05/19 0348 07/05/19 1734 07/08/19 0500  AST 38 31 55* 38 25  ALT 31 35 56* 48* 25  ALKPHOS 64 86 91 84 129*  BILITOT 1.3* 1.0 1.1 1.2 0.6  PROT 6.2* 6.3* 6.6 6.4* 5.6*  ALBUMIN 2.5* 2.5* 2.5* 2.4* 2.0*   No results for input(s): LIPASE, AMYLASE in the last 168 hours. No results for input(s): AMMONIA in the last 168 hours. Coagulation Profile: No results for input(s): INR, PROTIME in the last 168 hours. Cardiac Enzymes: No results for input(s): CKTOTAL, CKMB, CKMBINDEX, TROPONINI in the last 168 hours. BNP (last 3 results) No results for input(s): PROBNP in the last 8760 hours. HbA1C: No results for input(s): HGBA1C in the last 72 hours. CBG: Recent Labs  Lab 07/07/19 1525 07/07/19 1933 07/07/19 2319 07/08/19 0322 07/08/19 0748  GLUCAP 139* 97 115*  114* 96   Lipid Profile: Recent Labs    07/08/19 0500  TRIG 74   Thyroid Function Tests: Recent Labs    07/07/19 1000  TSH 0.064*   Anemia Panel: No results for input(s): VITAMINB12, FOLATE, FERRITIN, TIBC, IRON, RETICCTPCT in the last 72 hours. Sepsis Labs: Recent Labs  Lab 07/05/19 1734 07/05/19 2037 07/06/19 0153 07/07/19 0327  PROCALCITON 0.73  --  1.28 0.99  LATICACIDVEN 2.7* 2.7*  --   --     Recent Results (from the past 240 hour(s))  MRSA PCR Screening     Status: None   Collection Time: 07/05/19  3:59 PM   Specimen: Nasal Mucosa; Nasopharyngeal  Result Value Ref Range Status   MRSA by PCR NEGATIVE NEGATIVE Final    Comment:        The GeneXpert MRSA Assay (FDA approved for NASAL specimens only), is one component of a comprehensive MRSA colonization surveillance program. It is not intended to diagnose MRSA infection nor to guide or monitor treatment for MRSA infections. Performed at [facility], [place] [address], [place], [place] [address]   Culture, blood (routine x 2)     Status: None (Preliminary result)   Collection Time: 07/05/19  5:34 PM   Specimen: BLOOD LEFT FOREARM  Result Value Ref Range Status   Specimen Description   Final    BLOOD LEFT FOREARM Performed at [facility] [address], [place], [place] [address]    Special Requests   Final    BOTTLES DRAWN AEROBIC ONLY Blood Culture adequate volume Performed at [facility] [address] [place], [place] [address]    Culture   Final    NO GROWTH 3 DAYS Performed at [facility], [place] [address] [place], [place] [address]    Report Status PENDING  Incomplete  Culture, blood (routine x 2)     Status: None (Preliminary result)   Collection Time: 07/05/19  5:34 PM   Specimen: BLOOD  Result Value Ref Range Status   Specimen Description   Final    BLOOD LEFT ANTECUBITAL Performed at [facility] [address] [place], [place]  [address]    Special Requests   Final    BOTTLES DRAWN AEROBIC ONLY Blood Culture adequate volume Performed at [facility] [address], [place], [place] [address]    Culture   Final    NO GROWTH 3 DAYS Performed at [facility], [place] [address] [place], [place] [address]    Report Status PENDING  Incomplete  Culture, blood (routine x 2)     Status: None (Preliminary result)   Collection Time: 07/07/19  6:41 PM   Specimen: BLOOD  Result Value Ref Range Status   Specimen Description   Final    BLOOD LEFT ARM Performed at [facility] [address], [place], [place] [address]    Special Requests   Final    BOTTLES DRAWN AEROBIC ONLY Blood Culture adequate volume Performed at [facility] [address], [place], [place] [address]    Culture   Final    NO GROWTH < 12 HOURS Performed at [facility] [address], [place], [place] [address]    Report Status PENDING  Incomplete  Culture, blood (routine x 2)     Status: None (Preliminary result)   Collection Time: 07/07/19  6:41 PM   Specimen: BLOOD  Result Value Ref Range Status   Specimen Description   Final    BLOOD BLOOD LEFT FOREARM Performed at [facility] [address] [place], [place] [address]    Special Requests   Final    BOTTLES DRAWN AEROBIC ONLY Blood Culture adequate volume Performed at [facility] [address] [place], [place] [address]    Culture   Final    NO GROWTH < 12 HOURS Performed at [facility] [address], [place], [place] [address]    Report Status PENDING  Incomplete         Radiology Studies: No results found.      Scheduled Meds: . chlorhexidine  15 mL Mouth Rinse BID  . Chlorhexidine Gluconate Cloth  6 each Topical Daily  . docusate sodium  100 mg Oral BID  . insulin aspart  0-15 Units Subcutaneous Q4H  . mouth rinse   15 mL Mouth Rinse q12n4p  . methimazole  5 mg Oral Daily  . methocarbamol  500 mg Oral Q8H  . metoCLOPramide (REGLAN) injection  5 mg Intravenous Q8H  . metoprolol tartrate  25 mg Oral BID  . pantoprazole (PROTONIX) IV  40 mg Intravenous Q24H  . polyvinyl alcohol  1 drop Both Eyes TID  . scopolamine  1 patch Transdermal Q72H  . sodium chloride flush  10-40 mL Intracatheter Q12H  . sodium chloride flush  3 mL Intravenous Q12H   Continuous Infusions: . ampicillin-sulbactam (UNASYN) IV Stopped (07/08/19 0629)  . potassium chloride 10 mEq (07/08/19 0822)  . TPN ADULT (ION) 75 mL/hr at 07/08/19 0641  . TPN ADULT (ION)       LOS: 12 days     Georgette Shell, MD  07/08/2019, 9:24 AM

## 2019-07-09 LAB — CBC
HCT: 26.8 % — ABNORMAL LOW (ref 36.0–46.0)
Hemoglobin: 8.9 g/dL — ABNORMAL LOW (ref 12.0–15.0)
MCH: 26.4 pg (ref 26.0–34.0)
MCHC: 33.2 g/dL (ref 30.0–36.0)
MCV: 79.5 fL — ABNORMAL LOW (ref 80.0–100.0)
Platelets: 264 10*3/uL (ref 150–400)
RBC: 3.37 MIL/uL — ABNORMAL LOW (ref 3.87–5.11)
RDW: 17.2 % — ABNORMAL HIGH (ref 11.5–15.5)
WBC: 6.2 10*3/uL (ref 4.0–10.5)
nRBC: 0 % (ref 0.0–0.2)

## 2019-07-09 LAB — BASIC METABOLIC PANEL
Anion gap: 10 (ref 5–15)
BUN: 20 mg/dL (ref 8–23)
CO2: 20 mmol/L — ABNORMAL LOW (ref 22–32)
Calcium: 8.5 mg/dL — ABNORMAL LOW (ref 8.9–10.3)
Chloride: 102 mmol/L (ref 98–111)
Creatinine, Ser: 0.55 mg/dL (ref 0.44–1.00)
GFR calc Af Amer: >60 mL/min (ref >60)
GFR calc non Af Amer: >60 mL/min (ref >60)
Glucose, Bld: 131 mg/dL — ABNORMAL HIGH (ref 70–99)
Potassium: 3.9 mmol/L (ref 3.5–5.1)
Sodium: 132 mmol/L — ABNORMAL LOW (ref 135–145)

## 2019-07-09 LAB — GLUCOSE, CAPILLARY
Glucose-Capillary: 100 mg/dL — ABNORMAL HIGH (ref 70–99)
Glucose-Capillary: 110 mg/dL — ABNORMAL HIGH (ref 70–99)
Glucose-Capillary: 112 mg/dL — ABNORMAL HIGH (ref 70–99)
Glucose-Capillary: 113 mg/dL — ABNORMAL HIGH (ref 70–99)
Glucose-Capillary: 115 mg/dL — ABNORMAL HIGH (ref 70–99)
Glucose-Capillary: 125 mg/dL — ABNORMAL HIGH (ref 70–99)

## 2019-07-09 LAB — MAGNESIUM: Magnesium: 1.9 mg/dL (ref 1.7–2.4)

## 2019-07-09 LAB — PHOSPHORUS: Phosphorus: 3.4 mg/dL (ref 2.5–4.6)

## 2019-07-09 MED ORDER — FUROSEMIDE 10 MG/ML IJ SOLN
40.0000 mg | Freq: Once | INTRAMUSCULAR | Status: AC
  Administered 2019-07-09: 14:00:00 40 mg via INTRAVENOUS
  Filled 2019-07-09: qty 4

## 2019-07-09 MED ORDER — POTASSIUM CHLORIDE CRYS ER 20 MEQ PO TBCR
40.0000 meq | EXTENDED_RELEASE_TABLET | Freq: Once | ORAL | Status: AC
  Administered 2019-07-09: 14:00:00 40 meq via ORAL
  Filled 2019-07-09: qty 2

## 2019-07-09 MED ORDER — BOOST / RESOURCE BREEZE PO LIQD CUSTOM
1.0000 | Freq: Three times a day (TID) | ORAL | Status: DC
  Administered 2019-07-09 – 2019-07-23 (×27): 1 via ORAL

## 2019-07-09 MED ORDER — TRAVASOL 10 % IV SOLN
INTRAVENOUS | Status: AC
  Filled 2019-07-09: qty 900

## 2019-07-09 NOTE — Progress Notes (Signed)
Assessment & Plan: POD#8 SBO-EXPLORATORY LAPAROTOMYw/LYSIS OF ADHESIONS2/22 Dr. Marlou Starks One BM overnight per patient and charted             NPO due to possible dysphagia - Speech Path to evaluate             Continue TNA        [name], MD       [facility]       Office: [phone]   Chief Complaint: Small bowel obstruction  Subjective: Patient in bed, resting comfortably.  Awakens to voice.  States one BM overnight.  Denies abd pain.  Objective: Vital signs in last 24 hours: Temp:  [97.8 F (36.6 C)-101.9 F (38.8 C)] 97.8 F (36.6 C) (03/02 0400) Pulse Rate:  [79-128] 83 (03/02 0500) Resp:  [20-37] 29 (03/02 0744) BP: (148-215)/(56-166) 164/64 (03/02 0744) SpO2:  [92 %-100 %] 100 % (03/02 0500) Weight:  [69 kg] 69 kg (03/02 0500) Last BM Date: 07/08/19  Intake/Output from previous day: 03/01 0701 - 03/02 0700 In: 1169.6 [I.V.:823.4; IV Piggyback:346.2] Out: 2630 [Urine:2630] Intake/Output this shift: No intake/output data recorded.  Physical Exam: HEENT - sclerae clear, mucous membranes moist Neck - soft Abdomen - soft, mild distension; BS present; midline wound dry and intact  Lab Results:  Recent Labs    07/08/19 0500 07/09/19 0500  WBC 6.6 6.2  HGB 8.5* 8.9*  HCT 26.4* 26.8*  PLT 242 264   BMET Recent Labs    07/08/19 0500 07/09/19 0500  NA 135 132*  K 3.6 3.9  CL 109 102  CO2 19* 20*  GLUCOSE 112* 131*  BUN 18 20  CREATININE 0.55 0.55  CALCIUM 7.9* 8.5*   PT/INR No results for input(s): LABPROT, INR in the last 72 hours. Comprehensive Metabolic Panel:    Component Value Date/Time   NA 132 (L) 07/09/2019 0500   NA 135 07/08/2019 0500   NA 142 02/26/2019 1618   NA 141 02/04/2019 1719   K 3.9 07/09/2019 0500   K 3.6 07/08/2019 0500   CL 102 07/09/2019 0500   CL 109 07/08/2019 0500   CO2 20 (L) 07/09/2019 0500   CO2 19 (L) 07/08/2019 0500   BUN 20 07/09/2019 0500   BUN 18 07/08/2019  0500   BUN 19 02/26/2019 1618   BUN 15 02/04/2019 1719   CREATININE 0.55 07/09/2019 0500   CREATININE 0.55 07/08/2019 0500   GLUCOSE 131 (H) 07/09/2019 0500   GLUCOSE 112 (H) 07/08/2019 0500   CALCIUM 8.5 (L) 07/09/2019 0500   CALCIUM 7.9 (L) 07/08/2019 0500   AST 25 07/08/2019 0500   AST 38 07/05/2019 1734   ALT 25 07/08/2019 0500   ALT 48 (H) 07/05/2019 1734   ALKPHOS 129 (H) 07/08/2019 0500   ALKPHOS 84 07/05/2019 1734   BILITOT 0.6 07/08/2019 0500   BILITOT 1.2 07/05/2019 1734   BILITOT 0.4 02/04/2019 1719   PROT 5.6 (L) 07/08/2019 0500   PROT 6.4 (L) 07/05/2019 1734   PROT 7.7 02/04/2019 1719   ALBUMIN 2.0 (L) 07/08/2019 0500   ALBUMIN 2.4 (L) 07/05/2019 1734   ALBUMIN 4.1 02/04/2019 1719    Studies/Results: DG Chest 1 View  Result Date: 07/08/2019 CLINICAL DATA:  Acute respiratory failure EXAM: CHEST  1 VIEW COMPARISON:  07/05/2019 FINDINGS: Heart is normal size. Mild peribronchial thickening and interstitial prominence. Vague opacities throughout the left lung and right base. No effusions or acute bony abnormality. Right PICC line remains in  place, unchanged. IMPRESSION: Vague opacities throughout the left lung and at the right base could reflect pneumonia. Mild bronchitic changes. Electronically Signed   By: Rolm Baptise M.D.   On: 07/08/2019 10:02   DG Abd 1 View  Result Date: 07/08/2019 CLINICAL DATA:  84 year old female with nausea vomiting. EXAM: ABDOMEN - 1 VIEW COMPARISON:  Abdominal radiograph dated 07/05/2019. CT abdomen pelvis dated 07/06/2019. FINDINGS: Several dilated loops of small bowel measure up to 4.8 cm in the lower abdomen. Overall improved dilatation of the small bowel compared to the CT of 07/06/2019. Air is noted in the proximal colon. No free air or radiopaque calculi identified. Surgical clips in the pelvis and midline vertical anterior abdominal wall cutaneous staples. No acute osseous pathology. IMPRESSION: Dilated small bowel loops may represent ileus  versus obstruction. Interval improvement in the degree of small bowel dilatation compared to the CT of 07/06/2019. Electronically Signed   By: Anner Crete M.D.   On: 07/08/2019 17:25      [name] 07/09/2019  Patient ID: [name], female   DOB: 21-Jul-1935, 84 y.o.   MRN: [id number]

## 2019-07-09 NOTE — Progress Notes (Signed)
Nutrition Follow-up  DOCUMENTATION CODES:   Not applicable  INTERVENTION:  - will order Boost Breeze TID, each supplement provides 250 kcal and 9 grams of protein. - continue TPN per Pharmacist. - diet advancement as medically feasible.    NUTRITION DIAGNOSIS:   Increased nutrient needs related to acute illness, post-op healing as evidenced by estimated needs. -ongoing  GOAL:   Patient will meet greater than or equal to 90% of their needs -met with TPN   MONITOR:   PO intake, Supplement acceptance, Diet advancement, Labs, Weight trends, Skin  ASSESSMENT:   84 year old female admitted from home on 2/17 due to N/V and decreased p.o. intake. In the ED she was found to have a SBO and gastric distention. She was seen by surgery in the ED and NG tube was placed. She has a history of a pelvic carcinoid tumor for which she underwent ex lap in 08/2017 with bilateral salpingo-oophorectomy and omentectomy as well as partial hysterectomy.  Significant Events: 2/17- admission; NGT placed 2/18- initial RD assessment 2/19- NGT replaced and removed 2/20- NGT replaced x3 and removed x2 2/22- ex lap with LOAs; TPN initiation 2/23- diet advanced from NPO to CLD; NGT removed; patient confused and pulled PICC 2/24- diet advanced from CLD to [place] 2/25- double lumen PICC placed in R brachial; downgraded from FLD to CLD 2/26- downgraded from CLD to NPO; NGT placed  2/28- NGT removed; advanced from NPO to CLD and then back to NPO 3/2- advanced from NPO to CLD   Patient is a/o to self. No family/visitors present. Able to talk with RN at bedside who reports patient has had some ice chips and continues to cough which is thought to be 2/2 throat soreness/discomfort. Lunch tray is at bedside but has not yet been offered. Weight has been stable over the past 5 days.   She is receiving custom TPN @ goal rate of 75 ml/hr which is providing 1818 kcal, 90 grams protein. SLP worked with patient at bedside this  AM and recommended thin, clear liquids that are room temperature.   Per notes: - recurrent SBO s/p ex lap with LOAs - AKI--resolved - possible aspiration PNA - mild bronchitic changes  - possible need for SNF    Labs reviewed; CBGs: 125, 110, 112, and 100 mg/dl, Na: 132 mmol/l, Ca: 8.5 mg/dl. Medications reviewed; 100 mg colace BID, sliding scale novolog, 5 mg reglan TID, 40 mg IV protonix/day, 10 mEq IV KCl x4 runs 3/1.   Diet Order:   Diet Order            Diet clear liquid Room service appropriate? No; Fluid consistency: Thin  Diet effective now              EDUCATION NEEDS:   No education needs have been identified at this time  Skin:  Skin Assessment: Skin Integrity Issues: Skin Integrity Issues:: Incisions Incisions: abdomen 2/22  Last BM:  3/1  Height:   Ht Readings from Last 1 Encounters:  07/08/19 _0  (1.753 m)    Weight:   Wt Readings from Last 1 Encounters:  07/09/19 69 kg    Ideal Body Weight:  65.9 kg  BMI:  Body mass index is 22.46 kg/m.  Estimated Nutritional Needs:   Kcal:  2000-2200 kcal  Protein:  100-115 grams  Fluid:  >/= 2 L/day     Jarome Matin, MS, RD, LDN, CNSC Inpatient Clinical Dietitian RD pager # available in AMION  After hours/weekend pager # available in  AMION

## 2019-07-09 NOTE — Evaluation (Signed)
Clinical/Bedside Swallow Evaluation Patient Details  Name: Gloria Murphy MRN: [id number] Date of Birth: 01/30/1936  Today's Date: 07/09/2019 Time: SLP Start Time (ACUTE ONLY): 0813 SLP Stop Time (ACUTE ONLY): 0837 SLP Time Calculation (min) (ACUTE ONLY): 24 min  Past Medical History:  Past Medical History:  Diagnosis Date  . Diabetes mellitus without complication ([place])   . High cholesterol   . Hypertension   . Thyroid disease    Past Surgical History:  Past Surgical History:  Procedure Laterality Date  . ABDOMINAL HYSTERECTOMY    . EYE SURGERY Bilateral 06/2016   Cataract  . KNEE SURGERY Right   . LAPAROTOMY N/A 08/31/2017   Procedure: EXPLORATORY LAPAROTOMY; BILATERAL SALPINGOOPHERECTOMY; OMENTECTOMY, TUMOR DEBULKING;  Surgeon: Everitt Amber, MD;  Location: [facility];  Service: Gynecology;  Laterality: N/A;  . LAPAROTOMY N/A 07/01/2019   Procedure: EXPLORATORY LAPAROTOMY;  Surgeon: Jovita Kussmaul, MD;  Location: [facility];  Service: General;  Laterality: N/A;  . MASS EXCISION N/A 08/31/2017   Procedure: RESECTION OF PELVIC MASS;  Surgeon: Everitt Amber, MD;  Location: [facility];  Service: Gynecology;  Laterality: N/A;  . RIGHT HEART CATH N/A 02/11/2019   Procedure: RIGHT HEART CATH;  Surgeon: Nigel Mormon, MD;  Location: [facility];  Service: Cardiovascular;  Laterality: N/A;   HPI:  pt is an 84 yo female adm to [facility] with SBO s/p OR 2/22.  Pt has had several NG tubes placed but not one currently.  Pt also with h/o pelvic carcinoid tumor that caused the SBO.  Swallow evaluation ordered due to concerns for dysphagia, DM.  She has been treated with ABX for potential aspiration pna.   Assessment / Plan / Recommendation Clinical Impression  Pt with mildly decreased phonatory strength per her statement.  No focal CN deficit noted but she does complaint of glossus pain - Trials of po included ice chips, water, nectar thick apple juice and jello.  Initially pt with subtle throat clearing  and cough but this abated after furthter intake causing SLP to question if she had retained secretions in pharynx/larynx prior to po.  Pt with prolonged oral manipulation with jello but no residuals.  Odynophagia noted with very cold and acidic items - likley from series of NGs.  Recommend clears and SLP will follow up briefly to assure tolerating po well given subtle s/s of possible acute reversible pharyngeal dysphagia- ? edema from NGs.  Pt agreeable to plan and RN, NT informed. SLP Visit Diagnosis: Dysphagia, unspecified (R13.10)    Aspiration Risk  Mild aspiration risk    Diet Recommendation Thin liquid(clear liquids, no acidic or hot/cold liquids, prefer room temperature)   Medication Administration: Via alternative means(or with applesauce) Compensations: Slow rate;Small sips/bites Postural Changes: Seated upright at 90 degrees;Remain upright for at least 30 minutes after po intake    Other  Recommendations Oral Care Recommendations: Oral care BID   Follow up Recommendations None      Frequency and Duration min 2x/week  1 week       Prognosis Prognosis for Safe Diet Advancement: Good Barriers to Reach Goals: Cognitive deficits      Swallow Study   General Date of Onset: 07/09/19 HPI: pt is an 84 yo female adm to [facility] with SBO s/p OR 2/22.  Pt has had several NG tubes placed but not one currently.  Pt also with h/o pelvic carcinoid tumor that caused the SBO.  Swallow evaluation ordered due to concerns for dysphagia, DM.  She has  been treated with ABX for potential aspiration pna. Type of Study: Bedside Swallow Evaluation Diet Prior to this Study: NPO Temperature Spikes Noted: No Respiratory Status: Room air History of Recent Intubation: No Behavior/Cognition: Alert;Cooperative;Pleasant mood Oral Cavity Assessment: Dry Oral Care Completed by SLP: No Oral Cavity - Dentition: Dentures, top;Dentures, bottom Vision: Functional for self-feeding Self-Feeding Abilities: Total  assist Patient Positioning: Upright in bed Baseline Vocal Quality: Low vocal intensity Volitional Cough: Weak Volitional Swallow: Unable to elicit    Oral/Motor/Sensory Function Overall Oral Motor/Sensory Function: Within functional limits   Ice Chips Ice chips: Within functional limits Presentation: Spoon   Thin Liquid Presentation: Cup;Spoon Other Comments: Subtle cough x3 intially that abated with further intake. Question if pt had secretions retained in pharynx that she cleared with intake    Nectar Thick Nectar Thick Liquid: Within functional limits Presentation: Spoon;Cup   Honey Thick Honey Thick Liquid: Not tested   Puree Puree: Within functional limits Presentation: Spoon Other Comments: jello   Solid     Solid: Not tested      Macario Golds 07/09/2019,9:28 AM  Kathleen Lime, MS [place] Office [phone]

## 2019-07-09 NOTE — Progress Notes (Signed)
PHARMACY - TOTAL PARENTERAL NUTRITION CONSULT NOTE   Indication: bowel obstruction  Patient Measurements: Height: '5\' 9"'$  (175.3 cm) Weight: 152 lb 1.9 oz (69 kg) IBW/kg (Calculated) : 66.2 TPN AdjBW (KG): 69 Body mass index is 22.46 kg/m.  Assessment: 84 yo woman with hx of pelvic carcinoid tumor, admitted with N/V, decreased po intake was found to have SBO and gastric distention.  Glucose / Insulin: hx of type II diabetes, controlled with diet. CBGs - range 92-127, 15 units insulin added to TPN,  No SSI given in past 24hrs,  Electrolytes: Mag 1.9 after 2 gm bolus yest (goal>2), phos 3.4,  K 3.9 after 4 runs yest (goal>4); Na 132 Renal: WNL. Lasix 40 IV x 1 on 3/1 LFTs / TGs: AST/ALT WNL, Alk phos 129, TBili WNL, TGs 74 Prealbumin / albumin: 16.9> 12.1> 7.6 (3/1)  Intake / Output; MIVF: no IV fluids GI Imaging: 2/27 Abdominal CT: findings c/w SBO 3/1 KUB: ileus vs obstruction, interval improvement in small bowel dilatation Surgeries / Procedures: 2/22 exploratory laparotomy (n/a) lysis of adhesions  Central access: PICC 2/22, pt pulled PICC out on 2/23 ~ 2100,  re-insert PICC 2/26 and restart TPN TPN start date: 2/22 -2/23, 2/26 >>   Nutritional Goals RD recs KCal:1700-1900 , Protein:85-100gm , Fluid: </= 2 L/day  Goal TPN rate is 75 mL/hr (provides 90 g of protein and 1818 kcals per day) Protein: 50g/L Lipids 30g/L Dextrose 15%  Current Nutrition:  boost ordered tid, pro-stat bid>>not taking either 2/28 Trial Clear Liquid Diet>>back to NPO 2/28 2nd unable to swallow, TPN at goal rate  Plan:  Waiting on Speech for swallow eval.   If fails:  rec TFs per RD Continue TPN at goal rate 75 ml/hr Electrolytes in TPN: 72mq/L of Na, 628m/L of K, 62m762mL of Ca, 6mE39m of Mg, and 23 mmol/L of Phos. Cl:Ac ratio 1:1 Add trace elements to TPN daily and standard MVI on MWF only due to national backorder Continue 15 units insulin in TPN  Continue SSI to q4h,  adjust as needed  IVF per  MD, none at present Monitor TPN labs on Mon/Thurs, BMET, Mg & Phos in AM F/u ability to take POs/enteral feeding  MichEudelia Buncharm.D 07/09/2019 9:20 AM

## 2019-07-09 NOTE — Progress Notes (Signed)
PROGRESS NOTE    Gloria Murphy  [id number] DOB: 06-18-1935 DOA: 06/26/2019 PCP: Carlyle Basques, MD    Brief Narrative:84 year old woman admitted from home on 2/17 due to nausea and vomiting with decreased p.o. intake.  She was found to have bowel obstruction due to adhesions.  Patient was taken to the OR on 07/01/2019 for lysis of adhesions.  Again she ended up having recurrence of small bowel obstruction.  She is just being started on clear liquids on 07/09/2019.  Having ventricular bigeminy with low mag and K of 3.6 NG tube out 07/07/2019 No nausea vomiting overnight no BM overnight 07/09/2019 no nausea vomiting continues to have abdominal pain Assessment & Plan:   Principal Problem:   SBO (small bowel obstruction) (HCC) Active Problems:   Malignant carcinoid tumor of unknown primary site [facility])   Type 2 diabetes mellitus without complication, without long-term current use of insulin (HCC)   AKI (acute kidney injury) (HCC)   Nausea and vomiting   Abdominal pain   Lactic acidosis   Elevated troponin   Sinus tachycardia  #1  Recurrent small bowel obstruction status post exploratory laparotomy with lysis of adhesions 07/01/2019 -NG tube out 07/07/2019. Clear liquids started 07/09/2019 Seen by speech cleared  for  clear liquids. Continue TPN She is positive by 6.3 L give Lasix as needed. Seen by PT recommends skilled nursing facility.  Plan was for her to go home with her husband when she was ready to be discharged however staff tells me that patient's husband almost had an unresponsive episode yesterday while he was visiting with her.  CT abdomen 2/27-distended loops of small bowel with transition in the distal jejunum right lower quadrant findings consistent with small bowel obstruction with interval development of left basilar airspace disease which could reflect pneumonia or aspiration.  #2 history of pelvic carcinoid tumor followed by Dr. Denman George.  #3 AKI resolved likely  secondary to intravascular volume depletion improved with IV fluids.  #4 type 2 diabetes-diet controlled. Not on any medications at home.  #5 hypokalemiapotassium3.9 and magnesium 1.9 being repleted in the TPN.   #6 possible aspiration pneumonia-on Unasyn started 07/05/2019    Chest x-ray 07/08/2019.Vague opacities throughout the left lung and at the right base could reflect pneumonia.  Mild bronchiectatic changes.  #7 hyperthyroidism review of her chart indicates she had T3 of 4.6 and T4 of 1.48 both elevated with a low TSH of 0.027 on 06/27/2019. She was on methimazole as an outpatient by reviewing her chart.  Methimazole restarted.   Nutrition Problem: Increased nutrient needs Etiology: acute illness, post-op healing     Signs/Symptoms: estimated needs    Interventions: Boost Breeze, Prostat, MVI  Estimated body mass index is 22.46 kg/m as calculated from the following:   Height as of this encounter: 5\' 9"  (1.753 m).   Weight as of this encounter: 69 kg.   DVT prophylaxis:Lovenox Code Status:Full code Family Communication:None Disposition Plan:Patient came from home plan was to discharge her home once her bowel functions return.  However she has become increasingly weaker with recurrent bowel obstruction.  PT now recommending SNF. Barriers to discharge patient still on TPN and clear liquids.  Needs to tolerate regular diet and taper of the TPN prior to discharge.  Being followed by general surgery.   Consultants:  General surgery  Procedures:NG tube placed 06/26/2019 exploratory laparotomy with lysis of adhesions on 07/01/2019 Antimicrobials: Unasyn Subjective: She is awake and she is alert She knows she is in the hospital 1  BM recorded though she thinks she has not had a BM No nausea or vomiting  Objective: Vitals:   07/09/19 0738 07/09/19 0744 07/09/19 0746 07/09/19 0900  BP:  (!) 164/64  (!) 168/68  Pulse:      Resp: (!) 27 (!) 29  (!) 37    Temp:   98.2 F (36.8 C)   TempSrc:   Oral   SpO2:      Weight:      Height:        Intake/Output Summary (Last 24 hours) at 07/09/2019 1314 Last data filed at 07/09/2019 0630 Gross per 24 hour  Intake 470.92 ml  Output 1380 ml  Net -909.08 ml   Filed Weights   07/08/19 0500 07/08/19 2000 07/09/19 0500  Weight: 70.1 kg 69 kg 69 kg    Examination:  General exam: Appears calm and comfortable  Respiratory system: Decreased breath sounds at the bases with scattered rhonchi bilaterally to auscultation. Respiratory effort normal. Cardiovascular system: S1 & S2 heard, RRR. No JVD, murmurs, rubs, gallops or clicks. No pedal edema. Gastrointestinal system: Abdomen is nondistended, soft and tender. No organomegaly or masses felt. Normal bowel sounds heard.  Staples dry clean and intact Central nervous system: Alert and oriented. No focal neurological deficits. Extremities: Trace edema Skin: No rashes, lesions or ulcers Psychiatry: Judgement and insight appear normal. Mood & affect appropriate.     Data Reviewed: I have personally reviewed following labs and imaging studies  CBC: Recent Labs  Lab 07/05/19 1734 07/06/19 0153 07/07/19 0327 07/08/19 0500 07/09/19 0500  WBC 11.5* 11.8* 8.4 6.6 6.2  NEUTROABS  --   --   --  5.3  --   HGB 11.8* 10.4* 9.7* 8.5* 8.9*  HCT 36.5 31.8* 30.1* 26.4* 26.8*  MCV 81.5 80.3 81.4 80.0 79.5*  PLT 158 158 195 242 [id number]   Basic Metabolic Panel: Recent Labs  Lab 07/05/19 0348 07/05/19 0348 07/05/19 1734 07/06/19 0153 07/07/19 0327 07/08/19 0500 07/09/19 0500  NA 133*   < > 128* 134* 137 135 132*  K 3.7   < > 5.5* 4.0 3.6 3.6 3.9  CL 101   < > 99 105 109 109 102  CO2 21*   < > 18* 20* 19* 19* 20*  GLUCOSE 305*   < > 323* 125* 122* 112* 131*  BUN 13   < > 18 19 17 18 20   CREATININE 0.68   < > 0.82 0.64 0.60 0.55 0.55  CALCIUM 8.5*   < > 8.4* 8.3* 8.4* 7.9* 8.5*  MG 1.7  --   --  2.1 1.8 1.6* 1.9  PHOS 2.1*  --   --  2.6 2.1* 3.2 3.4    < > = values in this interval not displayed.   GFR: Estimated Creatinine Clearance: 57.6 mL/min (by C-G formula based on SCr of 0.55 mg/dL). Liver Function Tests: Recent Labs  Lab 07/04/19 0452 07/05/19 0348 07/05/19 1734 07/08/19 0500  AST 31 55* 38 25  ALT 35 56* 48* 25  ALKPHOS 86 91 84 129*  BILITOT 1.0 1.1 1.2 0.6  PROT 6.3* 6.6 6.4* 5.6*  ALBUMIN 2.5* 2.5* 2.4* 2.0*   No results for input(s): LIPASE, AMYLASE in the last 168 hours. No results for input(s): AMMONIA in the last 168 hours. Coagulation Profile: No results for input(s): INR, PROTIME in the last 168 hours. Cardiac Enzymes: No results for input(s): CKTOTAL, CKMB, CKMBINDEX, TROPONINI in the last 168 hours. BNP (last 3 results) No results  for input(s): PROBNP in the last 8760 hours. HbA1C: No results for input(s): HGBA1C in the last 72 hours. CBG: Recent Labs  Lab 07/08/19 1936 07/09/19 0003 07/09/19 0408 07/09/19 0757 07/09/19 1157  GLUCAP 127* 125* 110* 112* 100*   Lipid Profile: Recent Labs    07/08/19 0500  TRIG 74   Thyroid Function Tests: Recent Labs    07/07/19 1000  TSH 0.064*   Anemia Panel: No results for input(s): VITAMINB12, FOLATE, FERRITIN, TIBC, IRON, RETICCTPCT in the last 72 hours. Sepsis Labs: Recent Labs  Lab 07/05/19 1734 07/05/19 2037 07/06/19 0153 07/07/19 0327  PROCALCITON 0.73  --  1.28 0.99  LATICACIDVEN 2.7* 2.7*  --   --     Recent Results (from the past 240 hour(s))  MRSA PCR Screening     Status: None   Collection Time: 07/05/19  3:59 PM   Specimen: Nasal Mucosa; Nasopharyngeal  Result Value Ref Range Status   MRSA by PCR NEGATIVE NEGATIVE Final    Comment:        The GeneXpert MRSA Assay (FDA approved for NASAL specimens only), is one component of a comprehensive MRSA colonization surveillance program. It is not intended to diagnose MRSA infection nor to guide or monitor treatment for MRSA infections. Performed at [facility],  [place] [address] [place], [place] [address]   Culture, blood (routine x 2)     Status: None (Preliminary result)   Collection Time: 07/05/19  5:34 PM   Specimen: BLOOD LEFT FOREARM  Result Value Ref Range Status   Specimen Description   Final    BLOOD LEFT FOREARM Performed at [place] [address] [place], [place] [address]    Special Requests   Final    BOTTLES DRAWN AEROBIC ONLY Blood Culture adequate volume Performed at [facility] [address], [place], [place] [address]    Culture   Final    NO GROWTH 4 DAYS Performed at [facility], [place] [address], [place], [place] [address]    Report Status PENDING  Incomplete  Culture, blood (routine x 2)     Status: None (Preliminary result)   Collection Time: 07/05/19  5:34 PM   Specimen: BLOOD  Result Value Ref Range Status   Specimen Description   Final    BLOOD LEFT ANTECUBITAL Performed at [place] [address] [place], [place] [address]    Special Requests   Final    BOTTLES DRAWN AEROBIC ONLY Blood Culture adequate volume Performed at [place] [address] [place], [place] [address]    Culture   Final    NO GROWTH 4 DAYS Performed at [facility], [place] [address] [place], [place] [address]    Report Status PENDING  Incomplete  Culture, blood (routine x 2)     Status: None (Preliminary result)   Collection Time: 07/07/19  6:41 PM   Specimen: BLOOD  Result Value Ref Range Status   Specimen Description   Final    BLOOD LEFT ARM Performed at [place] [address] [place], [place] [address]    Special Requests   Final    BOTTLES DRAWN AEROBIC ONLY Blood Culture adequate volume Performed at [place] [address], [place], [place] [address]    Culture   Final    NO GROWTH 2 DAYS Performed at [place] [address], [place], [place]  [address]      Report Status PENDING  Incomplete  Culture, blood (routine x 2)     Status: None (Preliminary result)   Collection Time: 07/07/19  6:41 PM   Specimen: BLOOD  Result Value Ref Range Status   Specimen Description   Final    BLOOD BLOOD LEFT FOREARM Performed at [place] [address], [place], [place] [address]    Special Requests   Final    BOTTLES DRAWN AEROBIC ONLY Blood Culture adequate volume Performed at [place] [address], [place], [place] [address]    Culture   Final    NO GROWTH 2 DAYS Performed at [place] [address], [place], [place] [address]    Report Status PENDING  Incomplete         Radiology Studies: DG Chest 1 View  Result Date: 07/08/2019 CLINICAL DATA:  Acute respiratory failure EXAM: CHEST  1 VIEW COMPARISON:  07/05/2019 FINDINGS: Heart is normal size. Mild peribronchial thickening and interstitial prominence. Vague opacities throughout the left lung and right base. No effusions or acute bony abnormality. Right PICC line remains in place, unchanged. IMPRESSION: Vague opacities throughout the left lung and at the right base could reflect pneumonia. Mild bronchitic changes. Electronically Signed   By: Rolm Baptise M.D.   On: 07/08/2019 10:02   DG Abd 1 View  Result Date: 07/08/2019 CLINICAL DATA:  84 year old female with nausea vomiting. EXAM: ABDOMEN - 1 VIEW COMPARISON:  Abdominal radiograph dated 07/05/2019. CT abdomen pelvis dated 07/06/2019. FINDINGS: Several dilated loops of small bowel measure up to 4.8 cm in the lower abdomen. Overall improved dilatation of the small bowel compared to the CT of 07/06/2019. Air is noted in the proximal colon. No free air or radiopaque calculi identified. Surgical clips in the pelvis and midline vertical anterior abdominal wall cutaneous staples. No acute osseous pathology. IMPRESSION: Dilated small bowel loops may represent ileus versus obstruction. Interval  improvement in the degree of small bowel dilatation compared to the CT of 07/06/2019. Electronically Signed   By: Anner Crete M.D.   On: 07/08/2019 17:25        Scheduled Meds: . chlorhexidine  15 mL Mouth Rinse BID  . Chlorhexidine Gluconate Cloth  6 each Topical Daily  . docusate sodium  100 mg Oral BID  . enoxaparin (LOVENOX) injection  40 mg Subcutaneous Q24H  . feeding supplement  1 Container Oral TID BM  . insulin aspart  0-15 Units Subcutaneous Q4H  . mouth rinse  15 mL Mouth Rinse q12n4p  . methimazole  5 mg Oral Daily  . methocarbamol  500 mg Oral Q8H  . metoCLOPramide (REGLAN) injection  5 mg Intravenous Q8H  . metoprolol tartrate  25 mg Oral BID  . pantoprazole (PROTONIX) IV  40 mg Intravenous Q24H  . polyvinyl alcohol  1 drop Both Eyes TID  . scopolamine  1 patch Transdermal Q72H  . sodium chloride flush  10-40 mL Intracatheter Q12H  . sodium chloride flush  3 mL Intravenous Q12H   Continuous Infusions: . ampicillin-sulbactam (UNASYN) IV Stopped (07/09/19 1231)  . TPN ADULT (ION) 75 mL/hr at 07/08/19 1758  . TPN ADULT (ION)       LOS: 13 days     Georgette Shell, MD  07/09/2019, 1:14 PM

## 2019-07-09 NOTE — Plan of Care (Signed)
  Problem: Safety: Goal: Ability to remain free from injury will improve Outcome: Progressing   Problem: Skin Integrity: Goal: Risk for impaired skin integrity will decrease Outcome: Progressing   

## 2019-07-10 DIAGNOSIS — R300 Dysuria: Secondary | ICD-10-CM

## 2019-07-10 LAB — CULTURE, BLOOD (ROUTINE X 2)
Culture: NO GROWTH
Culture: NO GROWTH
Special Requests: ADEQUATE
Special Requests: ADEQUATE

## 2019-07-10 LAB — GLUCOSE, CAPILLARY
Glucose-Capillary: 114 mg/dL — ABNORMAL HIGH (ref 70–99)
Glucose-Capillary: 119 mg/dL — ABNORMAL HIGH (ref 70–99)
Glucose-Capillary: 118 mg/dL — ABNORMAL HIGH (ref 70–99)
Glucose-Capillary: 120 mg/dL — ABNORMAL HIGH (ref 70–99)
Glucose-Capillary: 127 mg/dL — ABNORMAL HIGH (ref 70–99)
Glucose-Capillary: 115 mg/dL — ABNORMAL HIGH (ref 70–99)

## 2019-07-10 LAB — BASIC METABOLIC PANEL
Anion gap: 7 (ref 5–15)
BUN: 26 mg/dL — ABNORMAL HIGH (ref 8–23)
CO2: 20 mmol/L — ABNORMAL LOW (ref 22–32)
Calcium: 8.5 mg/dL — ABNORMAL LOW (ref 8.9–10.3)
Chloride: 107 mmol/L (ref 98–111)
Creatinine, Ser: 0.75 mg/dL (ref 0.44–1.00)
GFR calc Af Amer: >60 mL/min (ref >60)
GFR calc non Af Amer: >60 mL/min (ref >60)
Glucose, Bld: 119 mg/dL — ABNORMAL HIGH (ref 70–99)
Potassium: 4.1 mmol/L (ref 3.5–5.1)
Sodium: 134 mmol/L — ABNORMAL LOW (ref 135–145)

## 2019-07-10 LAB — MAGNESIUM: Magnesium: 2 mg/dL (ref 1.7–2.4)

## 2019-07-10 LAB — PHOSPHORUS: Phosphorus: 3.6 mg/dL (ref 2.5–4.6)

## 2019-07-10 MED ORDER — TRAVASOL 10 % IV SOLN
INTRAVENOUS | Status: AC
  Filled 2019-07-10: qty 900

## 2019-07-10 NOTE — Progress Notes (Signed)
Assessment & Plan: POD#9 SBO-EXPLORATORY LAPAROTOMYw/LYSIS OF ADHESIONS2/22 Dr. Marlou Starks Speech path eval yesterday - allow clear liquids Small po intake - no BM's overnight Continue TNA, advance diet as tolerated        Gloria Gemma, MD       [facility]       Office: [phone]   Chief Complaint: SBO  Subjective: Patient in bed, pleasant, mild confusion.  Denies abd pain.  Objective: Vital signs in last 24 hours: Temp:  [98.3 F (36.8 C)-98.9 F (37.2 C)] 98.5 F (36.9 C) (03/03 0349) Pulse Rate:  [94-106] 94 (03/03 0600) Resp:  [27-37] 34 (03/03 0600) BP: (127-168)/(47-68) 142/57 (03/03 0600) SpO2:  [89 %-100 %] 100 % (03/03 0600) Weight:  [66.6 kg] 66.6 kg (03/03 0500) Last BM Date: 07/09/19  Intake/Output from previous day: 03/02 0701 - 03/03 0700 In: 2308.7 [P.O.:80; I.V.:1845.6; IV Piggyback:383.1] Out: 2450 [Urine:2450] Intake/Output this shift: No intake/output data recorded.  Physical Exam: HEENT - sclerae clear, mucous membranes moist Neck - soft Abdomen - soft, mild distension; wound dry and intact   Lab Results:  Recent Labs    07/08/19 0500 07/09/19 0500  WBC 6.6 6.2  HGB 8.5* 8.9*  HCT 26.4* 26.8*  PLT 242 264   BMET Recent Labs    07/09/19 0500 07/10/19 0303  NA 132* 134*  K 3.9 4.1  CL 102 107  CO2 20* 20*  GLUCOSE 131* 119*  BUN 20 26*  CREATININE 0.55 0.75  CALCIUM 8.5* 8.5*   PT/INR No results for input(s): LABPROT, INR in the last 72 hours. Comprehensive Metabolic Panel:    Component Value Date/Time   NA 134 (L) 07/10/2019 0303   NA 132 (L) 07/09/2019 0500   NA 142 02/26/2019 1618   NA 141 02/04/2019 1719   K 4.1 07/10/2019 0303   K 3.9 07/09/2019 0500   CL 107 07/10/2019 0303   CL 102 07/09/2019 0500   CO2 20 (L) 07/10/2019 0303   CO2 20 (L) 07/09/2019 0500   BUN 26 (H) 07/10/2019 0303   BUN 20 07/09/2019 0500   BUN 19 02/26/2019 1618   BUN 15  02/04/2019 1719   CREATININE 0.75 07/10/2019 0303   CREATININE 0.55 07/09/2019 0500   GLUCOSE 119 (H) 07/10/2019 0303   GLUCOSE 131 (H) 07/09/2019 0500   CALCIUM 8.5 (L) 07/10/2019 0303   CALCIUM 8.5 (L) 07/09/2019 0500   AST 25 07/08/2019 0500   AST 38 07/05/2019 1734   ALT 25 07/08/2019 0500   ALT 48 (H) 07/05/2019 1734   ALKPHOS 129 (H) 07/08/2019 0500   ALKPHOS 84 07/05/2019 1734   BILITOT 0.6 07/08/2019 0500   BILITOT 1.2 07/05/2019 1734   BILITOT 0.4 02/04/2019 1719   PROT 5.6 (L) 07/08/2019 0500   PROT 6.4 (L) 07/05/2019 1734   PROT 7.7 02/04/2019 1719   ALBUMIN 2.0 (L) 07/08/2019 0500   ALBUMIN 2.4 (L) 07/05/2019 1734   ALBUMIN 4.1 02/04/2019 1719    Studies/Results: DG Chest 1 View  Result Date: 07/08/2019 CLINICAL DATA:  Acute respiratory failure EXAM: CHEST  1 VIEW COMPARISON:  07/05/2019 FINDINGS: Heart is normal size. Mild peribronchial thickening and interstitial prominence. Vague opacities throughout the left lung and right base. No effusions or acute bony abnormality. Right PICC line remains in place, unchanged. IMPRESSION: Vague opacities throughout the left lung and at the right base could reflect pneumonia. Mild bronchitic changes. Electronically Signed   By: Rolm Baptise M.D.  On: 07/08/2019 10:02   DG Abd 1 View  Result Date: 07/08/2019 CLINICAL DATA:  84 year old female with nausea vomiting. EXAM: ABDOMEN - 1 VIEW COMPARISON:  Abdominal radiograph dated 07/05/2019. CT abdomen pelvis dated 07/06/2019. FINDINGS: Several dilated loops of small bowel measure up to 4.8 cm in the lower abdomen. Overall improved dilatation of the small bowel compared to the CT of 07/06/2019. Air is noted in the proximal colon. No free air or radiopaque calculi identified. Surgical clips in the pelvis and midline vertical anterior abdominal wall cutaneous staples. No acute osseous pathology. IMPRESSION: Dilated small bowel loops may represent ileus versus obstruction. Interval improvement  in the degree of small bowel dilatation compared to the CT of 07/06/2019. Electronically Signed   By: Anner Crete M.D.   On: 07/08/2019 17:25      Gloria Murphy 07/10/2019  Patient ID: [name], female   DOB: 1936-02-20, 84 y.o.   MRN: [id number]

## 2019-07-10 NOTE — Progress Notes (Addendum)
  Speech Language Pathology Treatment: Dysphagia  Patient Details Name: Gloria Murphy MRN: [id number] DOB: 12-May-1935 Today's Date: 07/10/2019 Time: [id number] SLP Time Calculation (min) (ACUTE ONLY): 25 min  Assessment / Plan / Recommendation Clinical Impression  Pt required moderate verbal/visual cues to decrease bolus size to tsp to abate cough with swallowing; Pt reports she had difficulties swallowing prior to admission. RN reports pt coughing with intake - although subtle, it is present.    Pt reports dysphagia was more with solids than liquids but also admits to liquids coming back up after swallowing.  She states her current swallow ability is improved compared to prior to admission. She does admit to sensing liquids sticking in pharynx and going the "wrong way".    SLP unable to conduct MBS due to pt's recurrent bowel obstruction.  Suspect multifactorial dysphagia - pharyngeal likely due to edema from multiple NG placements and possible esophageal/GI due to multiple GI issues. At this time, recommend to continue thin liquids with administration via tsp only.    Hopefully clinical indications and pt symptoms of aspiration will abate with time post=NG tubes.  Pt educated to recommendations and plan with teach back and written instruction.  Advised if she is coughing, to cough strongly to clear and rest.    RN, pt advised and contacted MD who was agreeable to plan.   HPI HPI: pt is an 84 yo female adm to [facility] with SBO s/p OR 2/22.  Pt has had several NG tubes placed but not one currently.  Pt also with h/o pelvic carcinoid tumor that caused the SBO.  Swallow evaluation ordered due to concerns for dysphagia, DM.  She has been treated with ABX for potential aspiration pna.  Pt was started on clear liquids yesterday, RN reports she is coughing with all intake - subtle cough and throat clearing.  Pt attributes this to secretion retention.      SLP Plan  Continue with current plan of care        Recommendations  Diet recommendations: Thin liquid Liquids provided via: Teaspoon Medication Administration: Whole meds with puree Supervision: Patient able to self feed Compensations: Slow rate;Small sips/bites Postural Changes and/or Swallow Maneuvers: Seated upright 90 degrees;Upright 30-60 min after meal                Oral Care Recommendations: Oral care BID SLP Visit Diagnosis: Dysphagia, unspecified (R13.10) Plan: Continue with current plan of care       GO                Macario Golds 07/10/2019, 11:33 AM  Kathleen Lime, MS [place] Office [phone]

## 2019-07-10 NOTE — Progress Notes (Signed)
PHARMACY - TOTAL PARENTERAL NUTRITION CONSULT NOTE   Indication: bowel obstruction  Patient Measurements: Height: _0  (175.3 cm) Weight: 146 lb 13.2 oz (66.6 kg) IBW/kg (Calculated) : 66.2 TPN AdjBW (KG): 69 Body mass index is 21.68 kg/m.  Assessment: 84 yo woman with hx of pelvic carcinoid tumor, admitted with N/V, decreased po intake was found to have SBO and gastric distention.  Glucose / Insulin: hx of type II diabetes, controlled with diet. CBGs all < 120, 15 units insulin added to TPN,  No SSI given in past 24hrs,  Electrolytes: K 4.1 after 40 po Kdur yest (goal>4); Na 134 Renal: WNL. Lasix 40 IV x 1 on 3/1 and 3/2 LFTs / TGs: AST/ALT WNL, Alk phos 129, TBili WNL, TGs 74 Prealbumin / albumin: 16.9> 12.1> 7.6 (3/1)  Intake / Output; MIVF: no IV fluids GI Imaging: 2/27 Abdominal CT: findings c/w SBO 3/1 KUB: ileus vs obstruction, interval improvement in small bowel dilatation Surgeries / Procedures: 2/22 exploratory laparotomy (n/a) lysis of adhesions Central access: PICC 2/22, pt pulled PICC out on 2/23 ~ 2100,  re-insert PICC 2/26 and restart TPN TPN start date: 2/22 -2/23, 2/26 >>   Nutritional Goals RD recs KCal:1700-1900 , Protein:85-100gm , Fluid: </= 2 L/day  Goal TPN rate is 75 mL/hr (provides 90 g of protein and 1818 kcals per day) Protein: 50g/L Lipids 30g/L Dextrose 15%  Current Nutrition:  3/3 tolerated CLD> advance diet as tolerated per CCS note ( not ordered yet)  boost ordered tid, TPN at goal rate  Plan:  Continue TPN at goal rate 75 ml/hr Electrolytes in TPN: 33mq/L of Na, 637m/L of K, 41m741mL of Ca, 6mE84m of Mg, and 23 mmol/L of Phos. Cl:Ac ratio 1:1 Add trace elements to TPN daily and standard MVI on MWF only due to national backorder Continue 15 units insulin in TPN  Continue SSI to q4h,  adjust as needed  IVF per MD, none at present Monitor TPN labs on Mon/Thurs F/u diet tolerance- per CCS note: advance diet as tolerated, still has CLD  ordered F/u ability to wean TPN off  MichEudelia Buncharm.D 07/10/2019 8:11 AM

## 2019-07-10 NOTE — Progress Notes (Signed)
Physical Therapy Treatment Patient Details Name: Gloria Murphy MRN: [id number] DOB: Jun 08, 1935 Today's Date: 07/10/2019    History of Present Illness 84 year old woman admitted from home on 2/17 due to nausea and vomiting with decreased p.o. intake. She has a history of a pelvic carcinoid tumor for which she underwent exploratory laparotomy in April 2019 with bilateral salpingo-oophorectomy and omentectomy as well as partial hysterectomy  In the ED she was found to have a large small bowel obstruction and gastric distention.  NG tube was placed, exploratory laparotomy 2/22.     PT Comments    Pt AxO x 2. General Comments: improved cognition but still required redirection and repeat VC's to complete task. Assisted OOB.   General bed mobility comments: increased time and repeat functional VC's to complete.  More self ability.  Required increased assist back to bed due to weakness.  General transfer comment: assisted from elevated bed to [facility] (void + BM) then assisted  from [facility] to walker with 75% VC's on proper hand placm,ent and turn completion.  Posterior LOB x 2.  VERY weak.  VERY unsteady. General Gait Details: assisted with a few side/leteral steps to [facility].  VERY weak.  VERY unsteady.  Posterior LOB.  "I'm weak" stated pt. Pt will need + 2 assist to progress ambulation.    Follow Up Recommendations  SNF     Equipment Recommendations       Recommendations for Other Services       Precautions / Restrictions Precautions Precautions: Fall Restrictions Weight Bearing Restrictions: No    Mobility  Bed Mobility Overal bed mobility: Needs Assistance Bed Mobility: Supine to Sit;Sit to Supine     Supine to sit: Mod assist Sit to supine: Max assist;Total assist   General bed mobility comments: increased time and repeat functional VC's to complete.  More self ability.  Required increased assist back to bed due to weakness  Transfers   Equipment used: Rolling walker (2  wheeled);None Transfers: Sit to/from [facility] Sit to Stand: Mod assist Stand pivot transfers: Mod assist;Max assist       General transfer comment: assisted from elevated bed to [facility] then from [facility] to walker with 75% VC's on proper hand placm,ent and turn completion.  Posterior LOB x 2.  VERY weak.  VERY unsteady.  Ambulation/Gait Ambulation/Gait assistance: Max assist Gait Distance (Feet): 2 Feet Assistive device: Rolling walker (2 wheeled) Gait Pattern/deviations: Step-to pattern     General Gait Details: assisted with a few side/leteral steps to [facility].  VERY weak.  VERY unsteady.  Posterior LOB.  "I'm weak" stated pt.   Stairs             Wheelchair Mobility    Modified Rankin (Stroke Patients Only)       Balance                                            Cognition Arousal/Alertness: Awake/alert Behavior During Therapy: Flat affect Overall Cognitive Status: No family/caregiver present to determine baseline cognitive functioning Area of Impairment: Orientation;Following commands;Problem solving                       Following Commands: Follows one step commands consistently       General Comments: improved cognition but still required redirection and repeat VC's to complete task  Exercises      General Comments        Pertinent Vitals/Pain Pain Assessment: No/denies pain    Home Living                      Prior Function            PT Goals (current goals can now be found in the care plan section) Progress towards PT goals: Progressing toward goals    Frequency    Min 2X/week      PT Plan Current plan remains appropriate    Co-evaluation              AM-PAC PT "6 Clicks" Mobility   Outcome Measure  Help needed turning from your back to your side while in a flat bed without using bedrails?: A Lot Help needed moving from lying on your back to sitting on the side of a  flat bed without using bedrails?: A Lot Help needed moving to and from a bed to a chair (including a wheelchair)?: A Lot Help needed standing up from a chair using your arms (e.g., wheelchair or bedside chair)?: A Lot Help needed to walk in hospital room?: A Lot Help needed climbing 3-5 steps with a railing? : Total 6 Click Score: 11    End of Session Equipment Utilized During Treatment: Gait belt Activity Tolerance: Patient limited by fatigue Patient left: in bed;with call bell/phone within reach;with bed alarm set Nurse Communication: Mobility status PT Visit Diagnosis: Other abnormalities of gait and mobility (R26.89)     Time: [id number] PT Time Calculation (min) (ACUTE ONLY): 26 min  Charges:  $Gait Training: 8-22 mins $Therapeutic Activity: 8-22 mins                     Rica Koyanagi  PTA Acute  Rehabilitation Services Pager      [phone] Office      [phone]

## 2019-07-10 NOTE — Progress Notes (Signed)
PROGRESS NOTE    Gloria Murphy    Code Status: DNR  [id number] DOB: 10/14/1935 DOA: 06/26/2019 LOS: 14 days  PCP: Carlyle Basques, MD CC:  Chief Complaint  Patient presents with  . Abdominal Pain  . Nausea  . Emesis       Hospital Summary   84 year old woman with history of carcinoid tumor admitted from home on 2/17 due to nausea and vomiting with decreased p.o. intake.  She was found to have bowel obstruction due to adhesions.  Patient was taken to the OR on 07/01/2019 for lysis of adhesions.  Again she ended up having recurrence of small bowel obstruction.  She has been started on clear liquids on 07/09/2019. Transfer to med telemetry unit 3/3.  A & P   Principal Problem:   SBO (small bowel obstruction) (HCC) Active Problems:   Malignant carcinoid tumor of unknown primary site [facility])   Type 2 diabetes mellitus without complication, without long-term current use of insulin (HCC)   AKI (acute kidney injury) (HCC)   Nausea and vomiting   Abdominal pain   Lactic acidosis   Elevated troponin   Sinus tachycardia   1. Recurrent SBO s/p exploratory laparotomy with lysis of adhesions (07/01/2019) a. NG tube out 2/28 b. Had BM today c. SLP eval today: Unable to conduct MBS due to patient's recurrent SBO, suspected multifactorial dysphagia: Pharyngeal due to edema from multiple NG tube placements and possible esophageal/GI due to multiple GI issues.  Recommended to continue with thin liquids with administration via tsp only d. Per surgery: Continue TNA, advance diet as tolerated 2. Dysuria a. UA 3. AKI secondary to intravascular volume depletion a. Resolved with IV fluids 4. Diet-controlled type 2 diabetes 5. Hypokalemia, resolved 6. Concern for aspiration pneumonia a. Unasyn started 2/26 b. Discontinue antibiotics today 7. Hypothyroidism a. Methimazole restarted this admission 8. History of pelvic carcinoid tumor followed by Dr. Denman George  DVT prophylaxis: Lovenox Family  Communication: Patient's husband at bedside has been updated  Disposition Plan:   Patient came from:   Home                                                                                          Anticipated d/c place: SNF  Barriers to d/c: Needs to tolerate p.o. intake and off TPN, hopeful discharge in 72 hours  Pressure injury documentation    None  Consultants  General surgery  Procedures  NG tube placed 06/26/2019  exploratory laparotomy with lysis of adhesions on 07/01/2019  Antibiotics   Anti-infectives (From admission, onward)   Start     Dose/Rate Route Frequency Ordered Stop   07/05/19 1700  Ampicillin-Sulbactam (UNASYN) 3 g in sodium chloride 0.9 % 100 mL IVPB  Status:  Discontinued     3 g 200 mL/hr over 30 Minutes Intravenous Every 6 hours 07/05/19 1619 07/10/19 0715   07/01/19 1015  cefoTEtan (CEFOTAN) 2 g in sodium chloride 0.9 % 100 mL IVPB     2 g 200 mL/hr over 30 Minutes Intravenous On call to O.R. 07/01/19 0847 07/01/19 1234   06/27/19 1000  piperacillin-tazobactam (ZOSYN) IVPB 3.375 g  Status:  Discontinued     3.375 g 12.5 mL/hr over 240 Minutes Intravenous Every 8 hours 06/27/19 0807 06/29/19 1119   06/27/19 0200  piperacillin-tazobactam (ZOSYN) IVPB 2.25 g  Status:  Discontinued     2.25 g 100 mL/hr over 30 Minutes Intravenous Every 8 hours 06/26/19 2012 06/27/19 0807   06/26/19 1700  piperacillin-tazobactam (ZOSYN) IVPB 3.375 g     3.375 g 100 mL/hr over 30 Minutes Intravenous  Once 06/26/19 1651 06/26/19 1823        Subjective   Patient seen and examined at bedside in no acute distress and resting comfortably. No acute events overnight.  Admits to dysuria which started this a.m. without any fevers.  Urine at bedside is dark.  Nurse at bedside states patient had BM this morning.  She had just started p.o. intake  Objective   Vitals:   07/10/19 1000 07/10/19 1100 07/10/19 1200 07/10/19 1413  BP: (!) 147/90  (!) 163/68 (!) 185/69  Pulse: 95 96     Resp: (!) 38 (!) 29 (!) 28 (!) 29  Temp:   98.6 F (37 C)   TempSrc:   Axillary   SpO2: (!) 83% 100%    Weight:      Height:        Intake/Output Summary (Last 24 hours) at 07/10/2019 1424 Last data filed at 07/10/2019 1238 Gross per 24 hour  Intake 2822.99 ml  Output 2450 ml  Net 372.99 ml   Filed Weights   07/08/19 2000 07/09/19 0500 07/10/19 0500  Weight: 69 kg 69 kg 66.6 kg    Examination:  Physical Exam Vitals and nursing note reviewed.  Constitutional:      Appearance: Normal appearance.  HENT:     Head: Normocephalic and atraumatic.  Eyes:     Conjunctiva/sclera: Conjunctivae normal.  Cardiovascular:     Rate and Rhythm: Normal rate and regular rhythm.  Pulmonary:     Effort: Pulmonary effort is normal.     Breath sounds: Normal breath sounds.  Abdominal:     General: Abdomen is flat.     Palpations: Abdomen is soft.  Genitourinary:    Comments: Urine at bedside is dark Musculoskeletal:        General: No swelling or tenderness.  Skin:    Coloration: Skin is not jaundiced or pale.  Neurological:     Mental Status: She is alert. Mental status is at baseline.  Psychiatric:        Mood and Affect: Mood normal.        Behavior: Behavior normal.     Data Reviewed: I have personally reviewed following labs and imaging studies  CBC: Recent Labs  Lab 07/05/19 1734 07/06/19 0153 07/07/19 0327 07/08/19 0500 07/09/19 0500  WBC 11.5* 11.8* 8.4 6.6 6.2  NEUTROABS  --   --   --  5.3  --   HGB 11.8* 10.4* 9.7* 8.5* 8.9*  HCT 36.5 31.8* 30.1* 26.4* 26.8*  MCV 81.5 80.3 81.4 80.0 79.5*  PLT 158 158 195 242 [id number]   Basic Metabolic Panel: Recent Labs  Lab 07/06/19 0153 07/07/19 0327 07/08/19 0500 07/09/19 0500 07/10/19 0303  NA 134* 137 135 132* 134*  K 4.0 3.6 3.6 3.9 4.1  CL 105 109 109 102 107  CO2 20* 19* 19* 20* 20*  GLUCOSE 125* 122* 112* 131* 119*  BUN 19 17 18 20  26*  CREATININE 0.64 0.60 0.55 0.55 0.75  CALCIUM 8.3* 8.4* 7.9* 8.5* 8.5*   MG  2.1 1.8 1.6* 1.9 2.0  PHOS 2.6 2.1* 3.2 3.4 3.6   GFR: Estimated Creatinine Clearance: 57.6 mL/min (by C-G formula based on SCr of 0.75 mg/dL). Liver Function Tests: Recent Labs  Lab 07/04/19 0452 07/05/19 0348 07/05/19 1734 07/08/19 0500  AST 31 55* 38 25  ALT 35 56* 48* 25  ALKPHOS 86 91 84 129*  BILITOT 1.0 1.1 1.2 0.6  PROT 6.3* 6.6 6.4* 5.6*  ALBUMIN 2.5* 2.5* 2.4* 2.0*   No results for input(s): LIPASE, AMYLASE in the last 168 hours. No results for input(s): AMMONIA in the last 168 hours. Coagulation Profile: No results for input(s): INR, PROTIME in the last 168 hours. Cardiac Enzymes: No results for input(s): CKTOTAL, CKMB, CKMBINDEX, TROPONINI in the last 168 hours. BNP (last 3 results) No results for input(s): PROBNP in the last 8760 hours. HbA1C: No results for input(s): HGBA1C in the last 72 hours. CBG: Recent Labs  Lab 07/09/19 1157 07/09/19 1551 07/09/19 1930 07/09/19 2300 07/10/19 0259  GLUCAP 100* 115* 114* 113* 119*   Lipid Profile: Recent Labs    07/08/19 0500  TRIG 74   Thyroid Function Tests: No results for input(s): TSH, T4TOTAL, FREET4, T3FREE, THYROIDAB in the last 72 hours. Anemia Panel: No results for input(s): VITAMINB12, FOLATE, FERRITIN, TIBC, IRON, RETICCTPCT in the last 72 hours. Sepsis Labs: Recent Labs  Lab 07/05/19 1734 07/05/19 2037 07/06/19 0153 07/07/19 0327  PROCALCITON 0.73  --  1.28 0.99  LATICACIDVEN 2.7* 2.7*  --   --     Recent Results (from the past 240 hour(s))  MRSA PCR Screening     Status: None   Collection Time: 07/05/19  3:59 PM   Specimen: Nasal Mucosa; Nasopharyngeal  Result Value Ref Range Status   MRSA by PCR NEGATIVE NEGATIVE Final    Comment:        The GeneXpert MRSA Assay (FDA approved for NASAL specimens only), is one component of a comprehensive MRSA colonization surveillance program. It is not intended to diagnose MRSA infection nor to guide or monitor treatment for MRSA  infections. Performed at [facility], [place] [address], [place], [place] [address]   Culture, blood (routine x 2)     Status: None   Collection Time: 07/05/19  5:34 PM   Specimen: BLOOD LEFT FOREARM  Result Value Ref Range Status   Specimen Description   Final    BLOOD LEFT FOREARM Performed at [place] [address], [place], [place] [address]    Special Requests   Final    BOTTLES DRAWN AEROBIC ONLY Blood Culture adequate volume Performed at [place] [address], [place], [place] [address]    Culture   Final    NO GROWTH 5 DAYS Performed at [facility], [place] [address] [place], [place] [address]    Report Status 07/10/2019 FINAL  Final  Culture, blood (routine x 2)     Status: None   Collection Time: 07/05/19  5:34 PM   Specimen: BLOOD  Result Value Ref Range Status   Specimen Description   Final    BLOOD LEFT ANTECUBITAL Performed at [place] [address] [place], [place] [address]    Special Requests   Final    BOTTLES DRAWN AEROBIC ONLY Blood Culture adequate volume Performed at [place] [address] [place],  [address]    Culture   Final    NO GROWTH 5 DAYS Performed  at [facility], [place] [address] [place], [place] [address]    Report Status 07/10/2019 FINAL  Final  Culture, blood (routine x 2)     Status: None (Preliminary result)   Collection Time: 07/07/19  6:41 PM   Specimen: BLOOD  Result Value Ref Range Status   Specimen Description   Final    BLOOD LEFT ARM Performed at [place] [address] [place], [place] [address]    Special Requests   Final    BOTTLES DRAWN AEROBIC ONLY Blood Culture adequate volume Performed at [place] [address], [place], [place] [address]    Culture   Final    NO GROWTH 3 DAYS Performed at [facility], [place] [address],  [place], [place] [address]    Report Status PENDING  Incomplete  Culture, blood (routine x 2)     Status: None (Preliminary result)   Collection Time: 07/07/19  6:41 PM   Specimen: BLOOD  Result Value Ref Range Status   Specimen Description   Final    BLOOD BLOOD LEFT FOREARM Performed at [place] [address], [place], [place] [address]    Special Requests   Final    BOTTLES DRAWN AEROBIC ONLY Blood Culture adequate volume Performed at [place] [address] [place], [place] [address]    Culture   Final    NO GROWTH 3 DAYS Performed at [facility], [place] [address] [place],  [address]    Report Status PENDING  Incomplete         Radiology Studies: DG Abd 1 View  Result Date: 07/08/2019 CLINICAL DATA:  84 year old female with nausea vomiting. EXAM: ABDOMEN - 1 VIEW COMPARISON:  Abdominal radiograph dated 07/05/2019. CT abdomen pelvis dated 07/06/2019. FINDINGS: Several dilated loops of small bowel measure up to 4.8 cm in the lower abdomen. Overall improved dilatation of the small bowel compared to the CT of 07/06/2019. Air is noted in the proximal colon. No free air or radiopaque calculi identified. Surgical clips in the pelvis and midline vertical anterior abdominal wall cutaneous staples. No acute osseous pathology. IMPRESSION: Dilated small bowel loops may represent ileus versus obstruction. Interval improvement in the degree of small bowel dilatation compared to the CT of 07/06/2019. Electronically Signed   By: Anner Crete M.D.   On: 07/08/2019 17:25        Scheduled Meds: . chlorhexidine  15 mL Mouth Rinse BID  . Chlorhexidine Gluconate Cloth  6 each Topical Daily  . docusate sodium  100 mg Oral BID  . enoxaparin (LOVENOX) injection  40 mg Subcutaneous Q24H  . feeding supplement  1 Container Oral TID BM  . insulin aspart  0-15 Units Subcutaneous Q4H  . mouth rinse  15 mL Mouth Rinse q12n4p  . methimazole   5 mg Oral Daily  . methocarbamol  500 mg Oral Q8H  . metoCLOPramide (REGLAN) injection  5 mg Intravenous Q8H  . metoprolol tartrate  25 mg Oral BID  . pantoprazole (PROTONIX) IV  40 mg Intravenous Q24H  . polyvinyl alcohol  1 drop Both Eyes TID  . scopolamine  1 patch Transdermal Q72H  . sodium chloride flush  10-40 mL Intracatheter Q12H  . sodium chloride flush  3 mL Intravenous Q12H   Continuous Infusions: . TPN ADULT (ION) 75 mL/hr at 07/10/19 1238  . TPN ADULT (ION)       Time spent: 30  minutes with over 50% of the time coordinating the patient's care    Harold Hedge, DO [facility] Pager [phone]  Call night coverage person covering after 7pm

## 2019-07-11 ENCOUNTER — Ambulatory Visit: Payer: Medicare Other

## 2019-07-11 LAB — CBC
HCT: 25.9 % — ABNORMAL LOW (ref 36.0–46.0)
Hemoglobin: 8.4 g/dL — ABNORMAL LOW (ref 12.0–15.0)
MCH: 25.9 pg — ABNORMAL LOW (ref 26.0–34.0)
MCHC: 32.4 g/dL (ref 30.0–36.0)
MCV: 79.9 fL — ABNORMAL LOW (ref 80.0–100.0)
Platelets: 340 10*3/uL (ref 150–400)
RBC: 3.24 MIL/uL — ABNORMAL LOW (ref 3.87–5.11)
RDW: 17.1 % — ABNORMAL HIGH (ref 11.5–15.5)
WBC: 4.8 10*3/uL (ref 4.0–10.5)
nRBC: 0 % (ref 0.0–0.2)

## 2019-07-11 LAB — URINALYSIS, ROUTINE W REFLEX MICROSCOPIC
Bilirubin Urine: NEGATIVE
Glucose, UA: NEGATIVE mg/dL
Hgb urine dipstick: NEGATIVE
Ketones, ur: NEGATIVE mg/dL
Leukocytes,Ua: NEGATIVE
Nitrite: NEGATIVE
Protein, ur: NEGATIVE mg/dL
Specific Gravity, Urine: 1.021 (ref 1.005–1.030)
pH: 6 (ref 5.0–8.0)

## 2019-07-11 LAB — COMPREHENSIVE METABOLIC PANEL
ALT: 96 U/L — ABNORMAL HIGH (ref 0–44)
AST: 108 U/L — ABNORMAL HIGH (ref 15–41)
Albumin: 2.2 g/dL — ABNORMAL LOW (ref 3.5–5.0)
Alkaline Phosphatase: 294 U/L — ABNORMAL HIGH (ref 38–126)
Anion gap: 11 (ref 5–15)
BUN: 24 mg/dL — ABNORMAL HIGH (ref 8–23)
CO2: 18 mmol/L — ABNORMAL LOW (ref 22–32)
Calcium: 8.6 mg/dL — ABNORMAL LOW (ref 8.9–10.3)
Chloride: 104 mmol/L (ref 98–111)
Creatinine, Ser: 0.67 mg/dL (ref 0.44–1.00)
GFR calc Af Amer: >60 mL/min (ref >60)
GFR calc non Af Amer: >60 mL/min (ref >60)
Glucose, Bld: 110 mg/dL — ABNORMAL HIGH (ref 70–99)
Potassium: 4.2 mmol/L (ref 3.5–5.1)
Sodium: 133 mmol/L — ABNORMAL LOW (ref 135–145)
Total Bilirubin: 0.6 mg/dL (ref 0.3–1.2)
Total Protein: 6.4 g/dL — ABNORMAL LOW (ref 6.5–8.1)

## 2019-07-11 LAB — PHOSPHORUS: Phosphorus: 3.3 mg/dL (ref 2.5–4.6)

## 2019-07-11 LAB — GLUCOSE, CAPILLARY
Glucose-Capillary: 117 mg/dL — ABNORMAL HIGH (ref 70–99)
Glucose-Capillary: 116 mg/dL — ABNORMAL HIGH (ref 70–99)
Glucose-Capillary: 100 mg/dL — ABNORMAL HIGH (ref 70–99)
Glucose-Capillary: 106 mg/dL — ABNORMAL HIGH (ref 70–99)
Glucose-Capillary: 102 mg/dL — ABNORMAL HIGH (ref 70–99)

## 2019-07-11 LAB — MAGNESIUM: Magnesium: 1.9 mg/dL (ref 1.7–2.4)

## 2019-07-11 MED ORDER — DOCUSATE SODIUM 50 MG/5ML PO LIQD
100.0000 mg | Freq: Two times a day (BID) | ORAL | Status: DC
  Administered 2019-07-11 – 2019-07-12 (×4): 100 mg via ORAL
  Filled 2019-07-11 (×5): qty 10

## 2019-07-11 MED ORDER — AMLODIPINE BESYLATE 5 MG PO TABS
5.0000 mg | ORAL_TABLET | Freq: Every day | ORAL | Status: DC
  Administered 2019-07-11 – 2019-07-12 (×2): 5 mg via ORAL
  Filled 2019-07-11 (×2): qty 1

## 2019-07-11 MED ORDER — TRAVASOL 10 % IV SOLN
INTRAVENOUS | Status: AC
  Filled 2019-07-11: qty 828

## 2019-07-11 NOTE — Progress Notes (Addendum)
Patient has had a decreased appetite today. Each meal has been attempted but patient has only taken two to four spoonfuls of clear liquids. Will continue to encourage supplements.

## 2019-07-11 NOTE — Progress Notes (Signed)
Call placed to patients spouse, Diona Fanti regarding continued plan of care. Questions, concerns were denied a this time.

## 2019-07-11 NOTE — NC FL2 (Signed)
[place] LEVEL OF CARE SCREENING TOOL     IDENTIFICATION  Patient Name: Gloria Murphy Birthdate: [birth year] Sex: female Admission Date (Current Location): 06/26/2019  [facility] and [place] Number:  Herbalist and Address:  [facility],  [place], [place]      Provider Number: [id number]  Attending Physician Name and Address:  Harold Hedge, MD  Relative Name and Phone Number:  Terril Lally [phone]    Current Level of Care: Hospital Recommended Level of Care: [place] Prior Approval Number:    Date Approved/Denied:   PASRR Number: [id number]  Discharge Plan: SNF    Current Diagnoses: Patient Active Problem List   Diagnosis Date Noted  . Sinus tachycardia 06/27/2019  . Type 2 diabetes mellitus without complication, without long-term current use of insulin ([place]) 06/26/2019  . SBO (small bowel obstruction) ([place]) 06/26/2019  . AKI (acute kidney injury) ([place]) 06/26/2019  . Nausea and vomiting 06/26/2019  . Abdominal pain 06/26/2019  . Lactic acidosis 06/26/2019  . Elevated troponin 06/26/2019  . Essential hypertension 02/22/2019  . Exertional dyspnea 02/04/2019  . Vision disturbance 12/19/2018  . TIA (transient ischemic attack) 12/19/2018  . Malignant carcinoid tumor of unknown primary site ([place]) 10/06/2017  . Pelvic mass 08/31/2017  . Elevated CA-125 08/31/2017  . Pelvic mass in female 08/31/2017    Orientation RESPIRATION BLADDER Height & Weight     Self, Situation, Place  Normal External catheter Weight: 66.6 kg Height:  5\' 9"  (175.3 cm)  BEHAVIORAL SYMPTOMS/MOOD NEUROLOGICAL BOWEL NUTRITION STATUS      Continent Diet(Regular)  AMBULATORY STATUS COMMUNICATION OF NEEDS Skin   Extensive Assist Verbally Surgical wounds(Abdomen surgical wound clean and dry, sacrum)                       Personal Care Assistance Level of Assistance  Bathing, Feeding, Dressing Bathing Assistance:  Limited assistance Feeding assistance: Independent Dressing Assistance: Limited assistance     Functional Limitations Info  Sight, Hearing, Speech Sight Info: Impaired Hearing Info: Adequate Speech Info: Adequate    SPECIAL CARE FACTORS FREQUENCY  PT (By licensed PT), OT (By licensed OT)     PT Frequency: x5 OT Frequency: x5            Contractures Contractures Info: Not present    Additional Factors Info  Code Status, Allergies Code Status Info: DNR Allergies Info: No Known Allergies           Current Medications (07/11/2019):  This is the current hospital active medication list Current Facility-Administered Medications  Medication Dose Route Frequency Provider Last Rate Last Admin  . acetaminophen (TYLENOL) suppository 650 mg  650 mg Rectal Q6H PRN Georgette Shell, MD   650 mg at 07/07/19 1830  . chlorhexidine (PERIDEX) 0.12 % solution 15 mL  15 mL Mouth Rinse BID Georgette Shell, MD   15 mL at 07/11/19 0916  . Chlorhexidine Gluconate Cloth 2 % PADS 6 each  6 each Topical Daily Georgette Shell, MD   6 each at 07/11/19 [phone]  . docusate (COLACE) 50 MG/5ML liquid 100 mg  100 mg Oral BID Lenis Noon, RPH   100 mg at 07/11/19 [id number]  . enoxaparin (LOVENOX) injection 40 mg  40 mg Subcutaneous Q24H Georgette Shell, MD   40 mg at 07/11/19 1300  . feeding supplement (BOOST / RESOURCE BREEZE) liquid 1 Container  1 Container Oral TID BM Rodena Piety,  Noland Fordyce, MD   1 Container at 07/11/19 [phone]  . insulin aspart (novoLOG) injection 0-15 Units  0-15 Units Subcutaneous Q4H Angela Adam, [facility]   2 Units at 07/10/19 1558  . labetalol (NORMODYNE) injection 5 mg  5 mg Intravenous Q6H PRN Georgette Shell, MD   5 mg at 07/10/19 1410  . MEDLINE mouth rinse  15 mL Mouth Rinse q12n4p Georgette Shell, MD   15 mL at 07/11/19 1300  . methimazole (TAPAZOLE) tablet 5 mg  5 mg Oral Daily Georgette Shell, MD   5 mg at 07/11/19 [id number]  . methocarbamol (ROBAXIN)  tablet 500 mg  500 mg Oral Q8H Meuth, Brooke A, PA-C   500 mg at 07/11/19 1346  . metoCLOPramide (REGLAN) injection 5 mg  5 mg Intravenous Q8H Meuth, Brooke A, PA-C   5 mg at 07/11/19 1346  . metoprolol tartrate (LOPRESSOR) injection 5 mg  5 mg Intravenous Q6H PRN Georgette Shell, MD   5 mg at 07/09/19 [id number]  . metoprolol tartrate (LOPRESSOR) tablet 25 mg  25 mg Oral BID Georgette Shell, MD   25 mg at 07/11/19 0915  . morphine 2 MG/ML injection 1-2 mg  1-2 mg Intravenous Q4H PRN Meuth, Brooke A, PA-C   2 mg at 07/11/19 1346  . ondansetron (ZOFRAN) injection 4 mg  4 mg Intravenous Q6H PRN Saverio Danker, PA-C   4 mg at 07/11/19 0947  . pantoprazole (PROTONIX) injection 40 mg  40 mg Intravenous Q24H Meuth, Brooke A, PA-C   40 mg at 07/11/19 0915  . phenol (CHLORASEPTIC) mouth spray 1 spray  1 spray Mouth/Throat PRN Saverio Danker, PA-C   1 spray at 06/28/19 1300  . polyvinyl alcohol (LIQUIFILM TEARS) 1.4 % ophthalmic solution 1 drop  1 drop Both Eyes TID Saverio Danker, PA-C   1 drop at 07/11/19 0948  . promethazine (PHENERGAN) injection 6.25 mg  6.25 mg Intravenous Q6H PRN Saverio Danker, PA-C   6.25 mg at 07/04/19 2322  . scopolamine (TRANSDERM-SCOP) 1 MG/3DAYS 1.5 mg  1 patch Transdermal Q72H Georgette Shell, MD   1.5 mg at 07/08/19 1604  . sodium chloride flush (NS) 0.9 % injection 10-40 mL  10-40 mL Intracatheter PRN Saverio Danker, PA-C      . sodium chloride flush (NS) 0.9 % injection 10-40 mL  10-40 mL Intracatheter Q12H Georgette Shell, MD   10 mL at 07/10/19 0921  . sodium chloride flush (NS) 0.9 % injection 10-40 mL  10-40 mL Intracatheter PRN Georgette Shell, MD   10 mL at 07/09/19 1713  . sodium chloride flush (NS) 0.9 % injection 3 mL  3 mL Intravenous Q12H Saverio Danker, PA-C   3 mL at 07/10/19 [id number]  . TPN ADULT (ION)   Intravenous Continuous TPN Eudelia Bunch, RPH 75 mL/hr at 07/10/19 1857 New Bag at 07/10/19 1857  . TPN ADULT (ION)   Intravenous Continuous  TPN Lenis Noon, RPH      . traMADol Veatrice Bourbon) tablet 50 mg  50 mg Oral Q6H PRN Wellington Hampshire, PA-C         Discharge Medications: Please see discharge summary for a list of discharge medications.  Relevant Imaging Results:  Relevant Lab Results:   Additional Information [id number]  Purcell Mouton, RN

## 2019-07-11 NOTE — Progress Notes (Addendum)
PROGRESS NOTE    Gloria Murphy    Code Status: DNR  [id number] DOB: [birth year] DOA: 06/26/2019 LOS: 15 days  PCP: Carlyle Basques, MD CC:  Chief Complaint  Patient presents with  . Abdominal Pain  . Nausea  . Emesis       Hospital Summary   84 year old woman with history of carcinoid tumor admitted from home on 2/17 due to nausea and vomiting with decreased p.o. intake.  She was found to have bowel obstruction due to adhesions.  Patient was taken to the OR on 07/01/2019 for lysis of adhesions.  Again she ended up having recurrence of small bowel obstruction.  She has been started on clear liquids on 07/09/2019. Transfer to med telemetry unit 3/3.  A & P   Principal Problem:   SBO (small bowel obstruction) (HCC) Active Problems:   Malignant carcinoid tumor of unknown primary site [facility])   Type 2 diabetes mellitus without complication, without long-term current use of insulin (HCC)   AKI (acute kidney injury) (HCC)   Nausea and vomiting   Abdominal pain   Lactic acidosis   Elevated troponin   Sinus tachycardia   1. Recurrent SBO s/p exploratory laparotomy with lysis of adhesions (07/01/2019), history of multiple abdominal surgeries a. NG tube out 2/28 b. SLP eval: Unable to conduct MBS due to patient's recurrent SBO, suspected multifactorial dysphagia: Pharyngeal due to edema from multiple NG tube placements and possible esophageal/GI due to multiple GI issues.  Recommended to continue with thin liquids with administration via tsp only c. Per surgery: Continue TNA, pulmonary toilet, CLD.  Plan to get staples out in the next day or so 2. Dysuria a. UA ordered but not obtained b. Received 5 days unasyn c. Continue to monitor for now pending UA 3. AKI secondary to intravascular volume depletion a. Resolved with IV fluids 4. Hypertension a. Start amlodipine b. On Lopressor, will consider switching to Coreg for better BP control 5. Diet-controlled type 2  diabetes 6. Hypokalemia, resolved 7. Concern for aspiration pneumonia a. Unasyn started 2/26, discontinued 3/3 b. Dry cough starting this morning c. Follow-up CXR 8. Hypothyroidism a. Methimazole restarted this admission 9. History of pelvic carcinoid tumor followed by Dr. Denman George  DVT prophylaxis: Lovenox Family Communication: Patient's husband at bedside has been updated yesterday Disposition Plan:   Patient came from:   Home                                                                                          Anticipated d/c place: SNF  Barriers to d/c: Needs to tolerate p.o. intake and off TPN  Pressure injury documentation    None  Consultants  General surgery  Procedures  NG tube placed 06/26/2019  exploratory laparotomy with lysis of adhesions on 07/01/2019  Antibiotics   Anti-infectives (From admission, onward)   Start     Dose/Rate Route Frequency Ordered Stop   07/05/19 1700  Ampicillin-Sulbactam (UNASYN) 3 g in sodium chloride 0.9 % 100 mL IVPB  Status:  Discontinued     3 g 200 mL/hr over 30 Minutes Intravenous Every 6 hours 07/05/19 1619 07/10/19 0715  07/01/19 1015  cefoTEtan (CEFOTAN) 2 g in sodium chloride 0.9 % 100 mL IVPB     2 g 200 mL/hr over 30 Minutes Intravenous On call to O.R. 07/01/19 0847 07/01/19 1234   06/27/19 1000  piperacillin-tazobactam (ZOSYN) IVPB 3.375 g  Status:  Discontinued     3.375 g 12.5 mL/hr over 240 Minutes Intravenous Every 8 hours 06/27/19 0807 06/29/19 1119   06/27/19 0200  piperacillin-tazobactam (ZOSYN) IVPB 2.25 g  Status:  Discontinued     2.25 g 100 mL/hr over 30 Minutes Intravenous Every 8 hours 06/26/19 2012 06/27/19 0807   06/26/19 1700  piperacillin-tazobactam (ZOSYN) IVPB 3.375 g     3.375 g 100 mL/hr over 30 Minutes Intravenous  Once 06/26/19 1651 06/26/19 1823        Subjective   Seen and examined at bedside no acute distress resting comfortably.  Admits to dry cough which started this morning.   Otherwise no complaints of dysuria or any other issues.  She is a poor historian Objective   Vitals:   07/11/19 0613 07/11/19 0916 07/11/19 1141 07/11/19 1331  BP:  (!) 147/71 (!) 172/82 (!) 157/80  Pulse:  92 94 100  Resp: (!) 22 20 18 18   Temp:  99.1 F (37.3 C) 98 F (36.7 C)   TempSrc:  Oral Oral   SpO2:  100% 98% 100%  Weight:      Height:        Intake/Output Summary (Last 24 hours) at 07/11/2019 1341 Last data filed at 07/11/2019 0915 Gross per 24 hour  Intake 1387.73 ml  Output 400 ml  Net 987.73 ml   Filed Weights   07/08/19 2000 07/09/19 0500 07/10/19 0500  Weight: 69 kg 69 kg 66.6 kg    Examination:  Physical Exam Vitals and nursing note reviewed.  Constitutional:      Appearance: She is not ill-appearing.  HENT:     Head: Normocephalic.  Cardiovascular:     Rate and Rhythm: Normal rate and regular rhythm.  Pulmonary:     Effort: Pulmonary effort is normal.     Breath sounds: Normal breath sounds.     Comments: Dry cough Abdominal:     General: Bowel sounds are normal.     Tenderness: There is no abdominal tenderness.  Neurological:     Mental Status: She is alert and oriented to person, place, and time.     Comments: Seems mildly disoriented but answers orientation questions appropriately  Psychiatric:        Mood and Affect: Mood normal.        Behavior: Behavior normal.     Data Reviewed: I have personally reviewed following labs and imaging studies  CBC: Recent Labs  Lab 07/06/19 0153 07/07/19 0327 07/08/19 0500 07/09/19 0500 07/11/19 0400  WBC 11.8* 8.4 6.6 6.2 4.8  NEUTROABS  --   --  5.3  --   --   HGB 10.4* 9.7* 8.5* 8.9* 8.4*  HCT 31.8* 30.1* 26.4* 26.8* 25.9*  MCV 80.3 81.4 80.0 79.5* 79.9*  PLT 158 195 242 264 [id number]   Basic Metabolic Panel: Recent Labs  Lab 07/07/19 0327 07/08/19 0500 07/09/19 0500 07/10/19 0303 07/11/19 0400  NA 137 135 132* 134* 133*  K 3.6 3.6 3.9 4.1 4.2  CL 109 109 102 107 104  CO2 19* 19* 20* 20*  18*  GLUCOSE 122* 112* 131* 119* 110*  BUN 17 18 20  26* 24*  CREATININE 0.60 0.55 0.55 0.75 0.67  CALCIUM  8.4* 7.9* 8.5* 8.5* 8.6*  MG 1.8 1.6* 1.9 2.0 1.9  PHOS 2.1* 3.2 3.4 3.6 3.3   GFR: Estimated Creatinine Clearance: 57.6 mL/min (by C-G formula based on SCr of 0.67 mg/dL). Liver Function Tests: Recent Labs  Lab 07/05/19 0348 07/05/19 1734 07/08/19 0500 07/11/19 0400  AST 55* 38 25 108*  ALT 56* 48* 25 96*  ALKPHOS 91 84 129* 294*  BILITOT 1.1 1.2 0.6 0.6  PROT 6.6 6.4* 5.6* 6.4*  ALBUMIN 2.5* 2.4* 2.0* 2.2*   No results for input(s): LIPASE, AMYLASE in the last 168 hours. No results for input(s): AMMONIA in the last 168 hours. Coagulation Profile: No results for input(s): INR, PROTIME in the last 168 hours. Cardiac Enzymes: No results for input(s): CKTOTAL, CKMB, CKMBINDEX, TROPONINI in the last 168 hours. BNP (last 3 results) No results for input(s): PROBNP in the last 8760 hours. HbA1C: No results for input(s): HGBA1C in the last 72 hours. CBG: Recent Labs  Lab 07/10/19 1528 07/10/19 2011 07/11/19 0451 07/11/19 0754 07/11/19 1138  GLUCAP 127* 115* 117* 116* 100*   Lipid Profile: No results for input(s): CHOL, HDL, LDLCALC, TRIG, CHOLHDL, LDLDIRECT in the last 72 hours. Thyroid Function Tests: No results for input(s): TSH, T4TOTAL, FREET4, T3FREE, THYROIDAB in the last 72 hours. Anemia Panel: No results for input(s): VITAMINB12, FOLATE, FERRITIN, TIBC, IRON, RETICCTPCT in the last 72 hours. Sepsis Labs: Recent Labs  Lab 07/05/19 1734 07/05/19 2037 07/06/19 0153 07/07/19 0327  PROCALCITON 0.73  --  1.28 0.99  LATICACIDVEN 2.7* 2.7*  --   --     Recent Results (from the past 240 hour(s))  MRSA PCR Screening     Status: None   Collection Time: 07/05/19  3:59 PM   Specimen: Nasal Mucosa; Nasopharyngeal  Result Value Ref Range Status   MRSA by PCR NEGATIVE NEGATIVE Final    Comment:        The GeneXpert MRSA Assay (FDA approved for NASAL  specimens only), is one component of a comprehensive MRSA colonization surveillance program. It is not intended to diagnose MRSA infection nor to guide or monitor treatment for MRSA infections. Performed at [facility], [place] [address], [place], [place] [address]   Culture, blood (routine x 2)     Status: None   Collection Time: 07/05/19  5:34 PM   Specimen: BLOOD LEFT FOREARM  Result Value Ref Range Status   Specimen Description   Final    BLOOD LEFT FOREARM Performed at [facility] [address] [place], [place] [address]    Special Requests   Final    BOTTLES DRAWN AEROBIC ONLY Blood Culture adequate volume Performed at [facility] [address] [place], [place] [address]    Culture   Final    NO GROWTH 5 DAYS Performed at [facility], [place] [address], [place], [place] [address]    Report Status 07/10/2019 FINAL  Final  Culture, blood (routine x 2)     Status: None   Collection Time: 07/05/19  5:34 PM   Specimen: BLOOD  Result Value Ref Range Status   Specimen Description   Final    BLOOD LEFT ANTECUBITAL Performed at [facility] [address], [place], [place] [address]    Special Requests   Final    BOTTLES DRAWN AEROBIC ONLY Blood Culture adequate volume Performed at [facility] [address], [place],  [address]    Culture  Final    NO GROWTH 5 DAYS Performed at [facility], [place] [address], [place], [place] [address]    Report Status 07/10/2019 FINAL  Final  Culture, blood (routine x 2)     Status: None (Preliminary result)   Collection Time: 07/07/19  6:41 PM   Specimen: BLOOD  Result Value Ref Range Status   Specimen Description   Final    BLOOD LEFT ARM Performed at [facility] [address], [place], [place] [address]    Special Requests   Final    BOTTLES DRAWN AEROBIC ONLY Blood Culture adequate  volume Performed at [facility] [address], [place], [place] [address]    Culture   Final    NO GROWTH 4 DAYS Performed at [facility], [place] [address] [place], [place] [address]    Report Status PENDING  Incomplete  Culture, blood (routine x 2)     Status: None (Preliminary result)   Collection Time: 07/07/19  6:41 PM   Specimen: BLOOD  Result Value Ref Range Status   Specimen Description   Final    BLOOD BLOOD LEFT FOREARM Performed at [facility] [address], [place], [place] [address]    Special Requests   Final    BOTTLES DRAWN AEROBIC ONLY Blood Culture adequate volume Performed at [facility] [address], [place], [place] [address]    Culture   Final    NO GROWTH 4 DAYS Performed at [facility], [place] [address] [place], [place] [address]    Report Status PENDING  Incomplete         Radiology Studies: No results found.      Scheduled Meds: . chlorhexidine  15 mL Mouth Rinse BID  . Chlorhexidine Gluconate Cloth  6 each Topical Daily  . docusate  100 mg Oral BID  . enoxaparin (LOVENOX) injection  40 mg Subcutaneous Q24H  . feeding supplement  1 Container Oral TID BM  . insulin aspart  0-15 Units Subcutaneous Q4H  . mouth rinse  15 mL Mouth Rinse q12n4p  . methimazole  5 mg Oral Daily  . methocarbamol  500 mg Oral Q8H  . metoCLOPramide (REGLAN) injection  5 mg Intravenous Q8H  . metoprolol tartrate  25 mg Oral BID  . pantoprazole (PROTONIX) IV  40 mg Intravenous Q24H  . polyvinyl alcohol  1 drop Both Eyes TID  . scopolamine  1 patch Transdermal Q72H  . sodium chloride flush  10-40 mL Intracatheter Q12H  . sodium chloride flush  3 mL Intravenous Q12H   Continuous Infusions: . TPN ADULT (ION) 75 mL/hr at 07/10/19 1857  . TPN ADULT (ION)       Time spent: 26 minutes with over 50% of the time coordinating the patient's care    Harold Hedge, DO [facility]  Hospitalist Pager [phone]  Call night coverage person covering after 7pm

## 2019-07-11 NOTE — TOC Progression Note (Signed)
Transition of Care [facility]) - Progression Note    Patient Details  Name: Gloria Murphy MRN: [id number] Date of Birth: [birth year]  Transition of [facility] CM/SW Contact  Purcell Mouton, RN Phone Number: 07/11/2019, 11:19 AM  Clinical Narrative:    Spoke with pt's husband who agreed with pt going to SNF.    Expected Discharge Plan: Skilled Nursing Facility Barriers to Discharge: Continued Medical Work up  Expected Discharge Plan and Services Expected Discharge Plan: [place]   Discharge Planning Services: CM Consult   Living arrangements for the past 2 months: Single Family Home                 DME Arranged: 3-N-1, [place] rolling DME Agency: [employer] Date DME Agency Contacted: 07/03/19 Time DME Agency Contacted: [phone] Representative spoke with at DME Agency: Thedore Mins HH Arranged: PT, OT           Social Determinants of Health ([place]) Interventions    Readmission Risk Interventions No flowsheet data found.

## 2019-07-11 NOTE — Progress Notes (Signed)
Patient ID: Gloria Murphy, female   DOB: [birth year], 84 y.o.   MRN: [id number]    10 Days Post-Op  Subjective: Patient just took a small sip of something to drink and now can not even have a conversation with me due to excessive coughing.  Can't tell me when she had a BM.  Shakes her head no to nausea.  ROS: unable to due to excessive coughing  Objective: Vital signs in last 24 hours: Temp:  [98.6 F (37 C)-99.5 F (37.5 C)] 99.1 F (37.3 C) (03/04 0916) Pulse Rate:  [91-99] 92 (03/04 0916) Resp:  [20-38] 20 (03/04 0916) BP: (144-185)/(67-90) 147/71 (03/04 0916) SpO2:  [83 %-100 %] 100 % (03/04 0916) Last BM Date: 07/10/19  Intake/Output from previous day: 03/03 0701 - 03/04 0700 In: 1882 [P.O.:180; I.V.:1685.2; IV Piggyback:16.8] Out: 600 [Urine:600] Intake/Output this shift: Total I/O In: 20 [P.O.:20] Out: -   PE: Abd: soft, minimally tender, midline incisions is c/d/i and healing well with staples in place, +BS, ND  Lab Results:  Recent Labs    07/09/19 0500 07/11/19 0400  WBC 6.2 4.8  HGB 8.9* 8.4*  HCT 26.8* 25.9*  PLT 264 340   BMET Recent Labs    07/10/19 0303 07/11/19 0400  NA 134* 133*  K 4.1 4.2  CL 107 104  CO2 20* 18*  GLUCOSE 119* 110*  BUN 26* 24*  CREATININE 0.75 0.67  CALCIUM 8.5* 8.6*   PT/INR No results for input(s): LABPROT, INR in the last 72 hours. CMP     Component Value Date/Time   NA 133 (L) 07/11/2019 0400   NA 142 02/26/2019 1618   K 4.2 07/11/2019 0400   CL 104 07/11/2019 0400   CO2 18 (L) 07/11/2019 0400   GLUCOSE 110 (H) 07/11/2019 0400   BUN 24 (H) 07/11/2019 0400   BUN 19 02/26/2019 1618   CREATININE 0.67 07/11/2019 0400   CALCIUM 8.6 (L) 07/11/2019 0400   PROT 6.4 (L) 07/11/2019 0400   PROT 7.7 02/04/2019 1719   ALBUMIN 2.2 (L) 07/11/2019 0400   ALBUMIN 4.1 02/04/2019 1719   AST 108 (H) 07/11/2019 0400   ALT 96 (H) 07/11/2019 0400   ALKPHOS 294 (H) 07/11/2019 0400   BILITOT 0.6 07/11/2019 0400   BILITOT  0.4 02/04/2019 1719   GFRNONAA >60 07/11/2019 0400   GFRAA >60 07/11/2019 0400   Lipase     Component Value Date/Time   LIPASE 38 06/26/2019 1352       Studies/Results: No results found.  Anti-infectives: Anti-infectives (From admission, onward)   Start     Dose/Rate Route Frequency Ordered Stop   07/05/19 1700  Ampicillin-Sulbactam (UNASYN) 3 g in sodium chloride 0.9 % 100 mL IVPB  Status:  Discontinued     3 g 200 mL/hr over 30 Minutes Intravenous Every 6 hours 07/05/19 1619 07/10/19 0715   07/01/19 1015  cefoTEtan (CEFOTAN) 2 g in sodium chloride 0.9 % 100 mL IVPB     2 g 200 mL/hr over 30 Minutes Intravenous On call to O.R. 07/01/19 0847 07/01/19 1234   06/27/19 1000  piperacillin-tazobactam (ZOSYN) IVPB 3.375 g  Status:  Discontinued     3.375 g 12.5 mL/hr over 240 Minutes Intravenous Every 8 hours 06/27/19 0807 06/29/19 1119   06/27/19 0200  piperacillin-tazobactam (ZOSYN) IVPB 2.25 g  Status:  Discontinued     2.25 g 100 mL/hr over 30 Minutes Intravenous Every 8 hours 06/26/19 2012 06/27/19 0807   06/26/19 1700  piperacillin-tazobactam (  ZOSYN) IVPB 3.375 g     3.375 g 100 mL/hr over 30 Minutes Intravenous  Once 06/26/19 1651 06/26/19 1823       Assessment/Plan DM Lactic acidosis - resolved AKI - resolved Hypokalemia Protein calorie Malnutrition - prealbumin 12.1 (2/23) H/o Carcinoid tumor s/p ex lapwith bilateral salpingo-oophorectomy, omentectomy and radical debulkingfor suspected ovarian cancer 08/31/2017 Dr. Denman George Dysphagia - patient currently coughing significantly after taking a small sip of something to drink. SLP to continue their assessment, but certainly concerned about her ability to take in orals  POD 10, S/pEXPLORATORY LAPAROTOMY (N/A)LYSIS OF ADHESIONS2/22 Dr. Marlou Starks for SBO - Mobilize, continue PT/OT - cont to work with SLP regarding diet, see above -pulm toilet with IS -will plan to get staples out in the next day or so  ID -zosyn  2/17>>2/20, cefotetan periop x1 FEN -IVF, CLD, Boost, TPN VTE -SCDs, lovenox Foley -d/c 2/23 Follow up -Dr. Marlou Starks   LOS: 15 days    Henreitta Cea , [facility] 07/11/2019, 9:59 AM Please see Amion for pager number during day hours 7:00am-4:30pm or 7:00am -11:30am on weekends

## 2019-07-11 NOTE — Progress Notes (Signed)
PHARMACY - TOTAL PARENTERAL NUTRITION CONSULT NOTE   Indication: bowel obstruction  Patient Measurements: Height: '5\' 9"'$  (175.3 cm) Weight: 146 lb 13.2 oz (66.6 kg) IBW/kg (Calculated) : 66.2 TPN AdjBW (KG): 69 Body mass index is 21.68 kg/m.  Assessment: 84 yo woman with hx of pelvic carcinoid tumor, admitted with N/V, decreased PO intake was found to have SBO and gastric distention.  Glucose / Insulin: hx of type II diabetes, controlled with diet. CBGs all within goal of 100-150, 15 units insulin in TPN,  2 units SSI given in past 24hrs,  Electrolytes: Na 133 slightly low; all other lytes WNL including Corrected Ca (10) Renal: WNL & stable. Lasix 40 IV x 1 on 3/1 and 3/2 LFTs / TGs: AST/ALT 108/96 - slightly elevated & increased , Alk phos 294, TBili WNL, TGs 74 (3/1) Prealbumin / albumin: 16.9> 12.1> 7.6 (3/1)  Intake / Output; MIVF: no IV fluids GI Imaging: 2/27 Abdominal CT: findings c/w SBO 3/1 KUB: ileus vs obstruction, interval improvement in small bowel dilatation Surgeries / Procedures: 2/22 exploratory laparotomy (n/a) lysis of adhesions Central access: PICC 2/22, pt pulled PICC out on 2/23 ~ 2100,  re-insert PICC 2/26 and restart TPN TPN start date: 2/22 -2/23, 2/26 >>   Nutritional Goals RD recs (3/2): KCal: 2000-2200 , Protein: 100-115 gm , Fluid: >/= 2 L/day  Goal TPN rate at 75 mL/hr (provides 83 g of protein and 1775 kcals per day) Protein: 46g/L Lipids 29g/L Dextrose 15%  Current Nutrition:  CLD ordered > advance diet as tolerated per CCS note;  Boost ordered TID (providing 27 g protein). TPN at goal rate  Plan:   Continue TPN at goal rate 75 ml/hr. Decrease protein since patient tolerating Boost TID.  Electrolytes in TPN: No changes.   80mq/L of Na, 6748m/L of K, 48m1mL of Ca, 6mE90m of Mg, and 23 mmol/L of Phos. Cl:Ac ratio 1:1  Add trace elements to TPN daily and standard MVI on MWF only due to national backorder  Continue 15 units insulin in TPN    Continue SSI to q4h,  adjust as needed   IVF per MD, none at present  Monitor TPN labs on Mon/Thurs  F/u diet tolerance- per CCS note: advance diet as tolerated, still has CLD ordered  F/u ability to wean TPN off  MaryLenis NoonarmD 07/11/19 7:50 AM

## 2019-07-12 DIAGNOSIS — E871 Hypo-osmolality and hyponatremia: Secondary | ICD-10-CM

## 2019-07-12 LAB — CULTURE, BLOOD (ROUTINE X 2)
Culture: NO GROWTH
Culture: NO GROWTH
Special Requests: ADEQUATE
Special Requests: ADEQUATE

## 2019-07-12 LAB — BASIC METABOLIC PANEL
Anion gap: 9 (ref 5–15)
BUN: 20 mg/dL (ref 8–23)
CO2: 19 mmol/L — ABNORMAL LOW (ref 22–32)
Calcium: 8.7 mg/dL — ABNORMAL LOW (ref 8.9–10.3)
Chloride: 101 mmol/L (ref 98–111)
Creatinine, Ser: 0.71 mg/dL (ref 0.44–1.00)
GFR calc Af Amer: >60 mL/min (ref >60)
GFR calc non Af Amer: >60 mL/min (ref >60)
Glucose, Bld: 134 mg/dL — ABNORMAL HIGH (ref 70–99)
Potassium: 4.2 mmol/L (ref 3.5–5.1)
Sodium: 129 mmol/L — ABNORMAL LOW (ref 135–145)

## 2019-07-12 LAB — CBC
HCT: 28.3 % — ABNORMAL LOW (ref 36.0–46.0)
Hemoglobin: 9 g/dL — ABNORMAL LOW (ref 12.0–15.0)
MCH: 25.4 pg — ABNORMAL LOW (ref 26.0–34.0)
MCHC: 31.8 g/dL (ref 30.0–36.0)
MCV: 79.9 fL — ABNORMAL LOW (ref 80.0–100.0)
Platelets: 377 10*3/uL (ref 150–400)
RBC: 3.54 MIL/uL — ABNORMAL LOW (ref 3.87–5.11)
RDW: 16.5 % — ABNORMAL HIGH (ref 11.5–15.5)
WBC: 6.6 10*3/uL (ref 4.0–10.5)
nRBC: 0 % (ref 0.0–0.2)

## 2019-07-12 LAB — HEPATIC FUNCTION PANEL
ALT: 85 U/L — ABNORMAL HIGH (ref 0–44)
AST: 70 U/L — ABNORMAL HIGH (ref 15–41)
Albumin: 2.3 g/dL — ABNORMAL LOW (ref 3.5–5.0)
Alkaline Phosphatase: 304 U/L — ABNORMAL HIGH (ref 38–126)
Bilirubin, Direct: 0.3 mg/dL — ABNORMAL HIGH (ref 0.0–0.2)
Indirect Bilirubin: 0.3 mg/dL (ref 0.3–0.9)
Total Bilirubin: 0.6 mg/dL (ref 0.3–1.2)
Total Protein: 6.8 g/dL (ref 6.5–8.1)

## 2019-07-12 LAB — GLUCOSE, CAPILLARY
Glucose-Capillary: 106 mg/dL — ABNORMAL HIGH (ref 70–99)
Glucose-Capillary: 116 mg/dL — ABNORMAL HIGH (ref 70–99)
Glucose-Capillary: 118 mg/dL — ABNORMAL HIGH (ref 70–99)
Glucose-Capillary: 125 mg/dL — ABNORMAL HIGH (ref 70–99)
Glucose-Capillary: 155 mg/dL — ABNORMAL HIGH (ref 70–99)
Glucose-Capillary: 96 mg/dL (ref 70–99)

## 2019-07-12 LAB — PHOSPHORUS: Phosphorus: 3 mg/dL (ref 2.5–4.6)

## 2019-07-12 LAB — MAGNESIUM: Magnesium: 1.8 mg/dL (ref 1.7–2.4)

## 2019-07-12 MED ORDER — SODIUM CHLORIDE 0.9 % IV SOLN: INTRAVENOUS | Status: AC

## 2019-07-12 MED ORDER — SODIUM CHLORIDE 0.9 % IV SOLN: INTRAVENOUS | Status: DC

## 2019-07-12 MED ORDER — AMLODIPINE BESYLATE 10 MG PO TABS
10.0000 mg | ORAL_TABLET | Freq: Every day | ORAL | Status: DC
  Administered 2019-07-13 – 2019-07-23 (×10): 10 mg via ORAL
  Filled 2019-07-12 (×10): qty 1

## 2019-07-12 MED ORDER — LACTATED RINGERS IV SOLN: INTRAVENOUS | Status: AC

## 2019-07-12 MED ORDER — TRAVASOL 10 % IV SOLN
INTRAVENOUS | Status: AC
  Filled 2019-07-12: qty 1020

## 2019-07-12 NOTE — Progress Notes (Signed)
  Speech Language Pathology Treatment: Dysphagia  Patient Details Name: Gloria Murphy MRN: [id number] DOB: February 22, 1936 Today's Date: 07/12/2019 Time: [id number] SLP Time Calculation (min) (ACUTE ONLY): 34 min  Assessment / Plan / Recommendation Clinical Impression  Pt seen today to assess po tolerance, intake very poor- Pt states she is not hungry or thirsty and continues to complain of "being full".  Spouse present and denies pt having coughing with or without intake prior to admission stating "not that he is aware of".    SLP observed her with intake of ice cream x3 boluses, applesauce x1 bolus, 3 sips of nectar thick OJ, 2 straw boluses of thin Breeze and one bolus of thin cranberry juice.  Pt pointed to chest and winced with swallow of cranberry juice - but denied pain.  Spouse stated "we used to eat ice cream at home every night" - but pt only wanted a few bites today. She stated she felt full and was not hungry or thirsty. Spouse also reports mentation of pt is impaired comapred to baseline with direct question cue. Today she was inconsistently answering SLPs questions, with some aphasia noted.     Delayed coughing after intake x2 of all boluses with expectoration of viscous secretions, per notes - pt getting "choked" easily on thin liquids - clinically tolerating nectar better however.  Would thus recommend nectar thick liquids - ? Advancing to full ? And allow thin water between meals. SLP is concerned for pt's poor intake - and thankful for palliative referral.  Spouse in room and both pt and spouse educated to plan and recommendations.    Left secure chat for Saverio Danker re: care plan desires and concerns  For GI issues paramount evidenced by frequent belching/complaint of "being full".  Requested to determine care plan ----? If desire advance to full liquid/nectar vs clear liquids/nectar - thin water between meals after mouth care and with tolerance.    Dependent on goals of care - will  follow up this weekend, otherwise will see pt Monday 07/15/2019.  Nurse educated to plan/communication left for surgery PA. Thanks.      HPI HPI: pt is an 84 yo female adm to [facility] with SBO s/p OR 2/22.  Pt has had several NG tubes placed but not one currently.  Pt also with h/o pelvic carcinoid tumor that caused the SBO.  Swallow evaluation ordered due to concerns for dysphagia, DM.  She has been treated with ABX for potential aspiration pna.  Pt was started on clear liquids yesterday, RN reports she is coughing with all intake - subtle cough and throat clearing.  Pt attributes this to secretion retention.      SLP Plan  Continue with current plan of care       Recommendations  Diet recommendations: Nectar-thick liquid(full liquids, nectar, free water between meals) Liquids provided via: Teaspoon Medication Administration: Whole meds with puree Supervision: Patient able to self feed Compensations: Slow rate;Small sips/bites Postural Changes and/or Swallow Maneuvers: Seated upright 90 degrees;Upright 30-60 min after meal                Oral Care Recommendations: Oral care BID SLP Visit Diagnosis: Dysphagia, unspecified (R13.10) Plan: Continue with current plan of care       GO                Gloria Murphy 07/12/2019, 4:33 PM Kathleen Lime, MS [place] Office [phone]

## 2019-07-12 NOTE — Progress Notes (Signed)
PO intake continues to be poor. She refused her 2000 Boost/Resource drink.She took her medications crushed in apple sauce after much encouragement.

## 2019-07-12 NOTE — Progress Notes (Signed)
Physical Therapy Treatment Patient Details Name: Gloria Murphy MRN: [id number] DOB: 22-Nov-1935 Today's Date: 07/12/2019    History of Present Illness 84 year old woman admitted from home on 2/17 due to nausea and vomiting with decreased p.o. intake. She has a history of a pelvic carcinoid tumor for which she underwent exploratory laparotomy in April 2019 with bilateral salpingo-oophorectomy and omentectomy as well as partial hysterectomy  In the ED she was found to have a large small bowel obstruction and gastric distention.  NG tube was placed, exploratory laparotomy 2/22.     PT Comments    Pt in bed AxO x 1 initially.  Asked me something about id I had any money.  Oriented to hospital when redirected but did recall she had surgery.  Assisted OOB to [facility].  General bed mobility comments: MAX assist plus used bed pad to transfer pt to EOB.  VERY weak.  Increased c/o nausea seated EOB.  Poor collapsed trunk posture and feeling "full".  Pt kept coughing/clearing her throat before any beverage was taken.General transfer comment: assisted from elevated bed to [facility] due to evidence on bed.  50% VC's on proper hand transfer and turn completion 1/4 pivot.  Also assisted with peri care as pt briefly stood.  VERY WEAK.  Unable to stand very long just enough to switch BSC with recliner from behind.  Unable to take any steps.  Poor flexed posture.  HIGH FALL RISK.  MAX c/o fatigue.General Gait Details: unable due to poor transfer ability. Very poor progress and VERY WEAK.   Follow Up Recommendations  SNF     Equipment Recommendations  None recommended by PT    Recommendations for Other Services       Precautions / Restrictions Precautions Precautions: Fall Precaution Comments: per speech therapist NO straws/NO cups .[name].. pt is teaspoon ONLY due to high aspiration risk Restrictions Weight Bearing Restrictions: No    Mobility  Bed Mobility Overal bed mobility: Needs Assistance Bed Mobility: Supine  to Sit     Supine to sit: Max assist     General bed mobility comments: MAX assist plus used bed pad to transfer pt to EOB.  VERY weak.  Increased c/o nausea seated EOB.  Poor collapsed trunk posture and feeling "full".  Pt kept coughing/clearing her throat before any beverage was taken.  Transfers Overall transfer level: Needs assistance Equipment used: None Transfers: Stand Pivot Transfers   Stand pivot transfers: Max assist       General transfer comment: assisted from elevated bed to [facility] due to evidence on bed.  50% VC's on proper hand transfer and turn completion 1/4 pivot.  Also assisted with peri care as pt briefly stood.  VERY WEAK.  Unable to stand very long just enough to switch BSC with recliner from behind.  Unable to take any steps.  Poor flexed posture.  HIGH FALL RISK.  MAX c/o fatigue.  Ambulation/Gait             General Gait Details: unable due to poor transfer ability.   Stairs             Wheelchair Mobility    Modified Rankin (Stroke Patients Only)       Balance                                            Cognition Arousal/Alertness: Awake/alert Behavior During  Therapy: Flat affect Overall Cognitive Status: Impaired/Different from baseline                                 General Comments: AxO x 1 only but following all directions and c/o nausea upon EOB      Exercises      General Comments        Pertinent Vitals/Pain Pain Assessment: No/denies pain    Home Living                      Prior Function            PT Goals (current goals can now be found in the care plan section) Progress towards PT goals: Progressing toward goals    Frequency    Min 2X/week      PT Plan Current plan remains appropriate    Co-evaluation              AM-PAC PT "6 Clicks" Mobility   Outcome Measure  Help needed turning from your back to your side while in a flat bed without using  bedrails?: A Lot Help needed moving from lying on your back to sitting on the side of a flat bed without using bedrails?: A Lot Help needed moving to and from a bed to a chair (including a wheelchair)?: A Lot Help needed standing up from a chair using your arms (e.g., wheelchair or bedside chair)?: A Lot Help needed to walk in hospital room?: Total Help needed climbing 3-5 steps with a railing? : Total 6 Click Score: 10    End of Session Equipment Utilized During Treatment: Gait belt   Patient left: in chair;with call bell/phone within reach;with chair alarm set Nurse Communication: Mobility status(pt voided and BM in [facility]) PT Visit Diagnosis: Other abnormalities of gait and mobility (R26.89)     Time: [id number] PT Time Calculation (min) (ACUTE ONLY): 33 min  Charges:  $Therapeutic Activity: 23-37 mins                     Rica Koyanagi  PTA Acute  Rehabilitation Services Pager      [phone] Office      [phone]

## 2019-07-12 NOTE — Progress Notes (Signed)
PROGRESS NOTE    Gloria Murphy    Code Status: DNR  [id number] DOB: Jun 15, 1935 DOA: 06/26/2019 LOS: 16 days  PCP: Carlyle Basques, MD CC:  Chief Complaint  Patient presents with   Abdominal Pain   Nausea   Emesis       Hospital Summary   84 year old woman with history of carcinoid tumor admitted from home on 2/17 due to nausea and vomiting with decreased p.o. intake.  She was found to have bowel obstruction due to adhesions.  Patient was taken to the OR on 07/01/2019 for lysis of adhesions.  Again she ended up having recurrence of small bowel obstruction.  She has been started on clear liquids on 07/09/2019. Transfer to med telemetry unit 3/3.  A & P   Principal Problem:   SBO (small bowel obstruction) (HCC) Active Problems:   Malignant carcinoid tumor of unknown primary site [facility])   Type 2 diabetes mellitus without complication, without long-term current use of insulin (HCC)   AKI (acute kidney injury) (HCC)   Nausea and vomiting   Abdominal pain   Lactic acidosis   Elevated troponin   Sinus tachycardia   1. Recurrent SBO s/p exploratory laparotomy with lysis of adhesions (07/01/2019), history of multiple abdominal surgeries a. NG tube out 2/28 b. Poor p.o. intake c. Had loose BM today d. Per surgery: Staples wound today e. continue following with SLP f. PT/OT g. Discussed with surgery today h. May need to consider G-tube if she does not tolerate p.o. intake 2. Dysuria, resolved without focused treatment  a. UA negative b. Received 5 days unasyn for aspiration pneumonia below 3. AKI secondary to intravascular volume depletion a. Resolved with IV fluids 4. Acute metabolic encephalopathy, likely from hyponatremia in setting of dementia 5. Hypertension a. Started amlodipine, increase b. On Lopressor, will consider switching to Coreg for better BP control 6. Diet-controlled type 2 diabetes 7. Hyponatremia likely volume depleted a. IV fluids 8. Hypokalemia,  resolved 9. Concern for aspiration pneumonia a. Unasyn started 2/26, discontinued 3/3 b. Dry cough resolved, CXR unremarkable 10. Hypothyroidism a. Methimazole restarted this admission 11. History of pelvic carcinoid tumor followed by Dr. Denman George  DVT prophylaxis: Lovenox Family Communication: Patient's husband at bedside has been updated yesterday Disposition Plan:   Patient came from:   Home                                                                                          Anticipated d/c place: SNF  Barriers to d/c: Needs to tolerate p.o. intake and off TPN, DC TBD  Pressure injury documentation    None  Consultants  General surgery  Procedures  NG tube placed 06/26/2019  exploratory laparotomy with lysis of adhesions on 07/01/2019 Staples removed 3/5  Antibiotics   Anti-infectives (From admission, onward)   Start     Dose/Rate Route Frequency Ordered Stop   07/05/19 1700  Ampicillin-Sulbactam (UNASYN) 3 g in sodium chloride 0.9 % 100 mL IVPB  Status:  Discontinued     3 g 200 mL/hr over 30 Minutes Intravenous Every 6 hours 07/05/19 1619 07/10/19 0715   07/01/19 1015  cefoTEtan (CEFOTAN) 2 g in sodium chloride 0.9 % 100 mL IVPB     2 g 200 mL/hr over 30 Minutes Intravenous On call to O.R. 07/01/19 0847 07/01/19 1234   06/27/19 1000  piperacillin-tazobactam (ZOSYN) IVPB 3.375 g  Status:  Discontinued     3.375 g 12.5 mL/hr over 240 Minutes Intravenous Every 8 hours 06/27/19 0807 06/29/19 1119   06/27/19 0200  piperacillin-tazobactam (ZOSYN) IVPB 2.25 g  Status:  Discontinued     2.25 g 100 mL/hr over 30 Minutes Intravenous Every 8 hours 06/26/19 2012 06/27/19 0807   06/26/19 1700  piperacillin-tazobactam (ZOSYN) IVPB 3.375 g     3.375 g 100 mL/hr over 30 Minutes Intravenous  Once 06/26/19 1651 06/26/19 1823        Subjective   Seen and examined at bedside with bedside nurse. Patient denies any complaints at this time and states her dysuria has resolved.  Nurse reports poor p.o. intake.  Objective   Vitals:   07/11/19 2008 07/12/19 0423 07/12/19 0441 07/12/19 1310  BP: (!) 163/68 (!) 161/81  (!) 146/63  Pulse: 87 (!) 109  96  Resp:  20  17  Temp: 98.9 F (37.2 C) 99.9 F (37.7 C)  97.7 F (36.5 C)  TempSrc: Oral Oral  Oral  SpO2: 100% 95%  90%  Weight:   66.5 kg   Height:        Intake/Output Summary (Last 24 hours) at 07/12/2019 1531 Last data filed at 07/12/2019 1311 Gross per 24 hour  Intake 1179.86 ml  Output 1100 ml  Net 79.86 ml   Filed Weights   07/09/19 0500 07/10/19 0500 07/12/19 0441  Weight: 69 kg 66.6 kg 66.5 kg    Examination:  Physical Exam Vitals and nursing note reviewed. Exam conducted with a chaperone present.  Constitutional:      General: She is not in acute distress. HENT:     Head: Normocephalic.     Mouth/Throat:     Comments: Dry Cardiovascular:     Rate and Rhythm: Normal rate and regular rhythm.  Pulmonary:     Effort: Pulmonary effort is normal.     Breath sounds: Normal breath sounds.  Abdominal:     General: Bowel sounds are decreased.     Tenderness: There is no abdominal tenderness.  Musculoskeletal:     Comments: RUE central line without erythema  Neurological:     Mental Status: She is alert.     Comments: AAO x2     Data Reviewed: I have personally reviewed following labs and imaging studies  CBC: Recent Labs  Lab 07/07/19 0327 07/08/19 0500 07/09/19 0500 07/11/19 0400 07/12/19 0500  WBC 8.4 6.6 6.2 4.8 6.6  NEUTROABS  --  5.3  --   --   --   HGB 9.7* 8.5* 8.9* 8.4* 9.0*  HCT 30.1* 26.4* 26.8* 25.9* 28.3*  MCV 81.4 80.0 79.5* 79.9* 79.9*  PLT 195 242 264 340 [id number]   Basic Metabolic Panel: Recent Labs  Lab 07/08/19 0500 07/09/19 0500 07/10/19 0303 07/11/19 0400 07/12/19 0500  NA 135 132* 134* 133* 129*  K 3.6 3.9 4.1 4.2 4.2  CL 109 102 107 104 101  CO2 19* 20* 20* 18* 19*  GLUCOSE 112* 131* 119* 110* 134*  BUN 18 20 26* 24* 20  CREATININE 0.55 0.55 0.75  0.67 0.71  CALCIUM 7.9* 8.5* 8.5* 8.6* 8.7*  MG 1.6* 1.9 2.0 1.9 1.8  PHOS 3.2 3.4 3.6 3.3 3.0  GFR: Estimated Creatinine Clearance: 57.6 mL/min (by C-G formula based on SCr of 0.71 mg/dL). Liver Function Tests: Recent Labs  Lab 07/05/19 1734 07/08/19 0500 07/11/19 0400 07/12/19 0758  AST 38 25 108* 70*  ALT 48* 25 96* 85*  ALKPHOS 84 129* 294* 304*  BILITOT 1.2 0.6 0.6 0.6  PROT 6.4* 5.6* 6.4* 6.8  ALBUMIN 2.4* 2.0* 2.2* 2.3*   No results for input(s): LIPASE, AMYLASE in the last 168 hours. No results for input(s): AMMONIA in the last 168 hours. Coagulation Profile: No results for input(s): INR, PROTIME in the last 168 hours. Cardiac Enzymes: No results for input(s): CKTOTAL, CKMB, CKMBINDEX, TROPONINI in the last 168 hours. BNP (last 3 results) No results for input(s): PROBNP in the last 8760 hours. HbA1C: No results for input(s): HGBA1C in the last 72 hours. CBG: Recent Labs  Lab 07/11/19 2005 07/12/19 0010 07/12/19 0411 07/12/19 0743 07/12/19 1201  GLUCAP 102* 116* 106* 118* 125*   Lipid Profile: No results for input(s): CHOL, HDL, LDLCALC, TRIG, CHOLHDL, LDLDIRECT in the last 72 hours. Thyroid Function Tests: No results for input(s): TSH, T4TOTAL, FREET4, T3FREE, THYROIDAB in the last 72 hours. Anemia Panel: No results for input(s): VITAMINB12, FOLATE, FERRITIN, TIBC, IRON, RETICCTPCT in the last 72 hours. Sepsis Labs: Recent Labs  Lab 07/05/19 1734 07/05/19 2037 07/06/19 0153 07/07/19 0327  PROCALCITON 0.73  --  1.28 0.99  LATICACIDVEN 2.7* 2.7*  --   --     Recent Results (from the past 240 hour(s))  MRSA PCR Screening     Status: None   Collection Time: 07/05/19  3:59 PM   Specimen: Nasal Mucosa; Nasopharyngeal  Result Value Ref Range Status   MRSA by PCR NEGATIVE NEGATIVE Final    Comment:        The GeneXpert MRSA Assay (FDA approved for NASAL specimens only), is one component of a comprehensive MRSA colonization surveillance program. It  is not intended to diagnose MRSA infection nor to guide or monitor treatment for MRSA infections. Performed at [facility], [place] [address], [place], [place] [address]   Culture, blood (routine x 2)     Status: None   Collection Time: 07/05/19  5:34 PM   Specimen: BLOOD LEFT FOREARM  Result Value Ref Range Status   Specimen Description   Final    BLOOD LEFT FOREARM Performed at [place] [address], [place], [place] [address]    Special Requests   Final    BOTTLES DRAWN AEROBIC ONLY Blood Culture adequate volume Performed at [place] [address], [place], [place] [address]    Culture   Final    NO GROWTH 5 DAYS Performed at [facility], [place] [address] [place], [place] [address]    Report Status 07/10/2019 FINAL  Final  Culture, blood (routine x 2)     Status: None   Collection Time: 07/05/19  5:34 PM   Specimen: BLOOD  Result Value Ref Range Status   Specimen Description   Final    BLOOD LEFT ANTECUBITAL Performed at [place] [address] [place], [place] [address]    Special Requests   Final    BOTTLES DRAWN AEROBIC ONLY Blood Culture adequate volume Performed at [place] [address] [place], [place] [address]    Culture   Final    NO GROWTH 5 DAYS Performed at [facility], [place] [address] [place], [place]  [id number]    Report Status 07/10/2019 FINAL  Final  Culture, blood (routine x 2)     Status: None   Collection Time: 07/07/19  6:41 PM   Specimen: BLOOD  Result Value Ref Range Status   Specimen Description   Final    BLOOD LEFT ARM Performed at [place] [address], [place], [place] [address]    Special Requests   Final    BOTTLES DRAWN AEROBIC ONLY Blood Culture adequate volume Performed at [place] [facility] [address] [place], [place] [address]    Culture   Final    NO  GROWTH 5 DAYS Performed at [facility], [place] [address] [place], [place] [address]    Report Status 07/12/2019 FINAL  Final  Culture, blood (routine x 2)     Status: None   Collection Time: 07/07/19  6:41 PM   Specimen: BLOOD  Result Value Ref Range Status   Specimen Description   Final    BLOOD BLOOD LEFT FOREARM Performed at [place] [address] [place], [place] [address]    Special Requests   Final    BOTTLES DRAWN AEROBIC ONLY Blood Culture adequate volume Performed at [place] [address], [place], [place] [address]    Culture   Final    NO GROWTH 5 DAYS Performed at [facility], [place] [address] [place], [place] [address]    Report Status 07/12/2019 FINAL  Final         Radiology Studies: No results found.      Scheduled Meds:  amLODipine  5 mg Oral Daily   chlorhexidine  15 mL Mouth Rinse BID   Chlorhexidine Gluconate Cloth  6 each Topical Daily   docusate  100 mg Oral BID   enoxaparin (LOVENOX) injection  40 mg Subcutaneous Q24H   feeding supplement  1 Container Oral TID BM   insulin aspart  0-15 Units Subcutaneous Q4H   mouth rinse  15 mL Mouth Rinse q12n4p   methimazole  5 mg Oral Daily   methocarbamol  500 mg Oral Q8H   metoCLOPramide (REGLAN) injection  5 mg Intravenous Q8H   metoprolol tartrate  25 mg Oral BID   pantoprazole (PROTONIX) IV  40 mg Intravenous Q24H   polyvinyl alcohol  1 drop Both Eyes TID   scopolamine  1 patch Transdermal Q72H   sodium chloride flush  10-40 mL Intracatheter Q12H   sodium chloride flush  3 mL Intravenous Q12H   Continuous Infusions:  TPN ADULT (ION) 75 mL/hr at 07/11/19 1800   TPN ADULT (ION)       Time spent: 27 minutes with over 50% of the time coordinating the patient's care    Harold Hedge, DO [facility] Pager [phone]  Call night coverage person covering after 7pm

## 2019-07-12 NOTE — Progress Notes (Signed)
Patient ID: Gloria Murphy, female   DOB: 1935-08-05, 84 y.o.   MRN: [id number]    11 Days Post-Op  Subjective: Patient states she has no pain today.  Trying to eat some of her liquids, but having difficulty.  She is trying to drink her jello.  States she is hungry.  Unsure if she has had a BM, none documented, but will ask RN to clarify that.  ROS: See above, otherwise other systems negative  Objective: Vital signs in last 24 hours: Temp:  [98 F (36.7 C)-99.9 F (37.7 C)] 99.9 F (37.7 C) (03/05 0423) Pulse Rate:  [87-109] 109 (03/05 0423) Resp:  [18-20] 20 (03/05 0423) BP: (147-172)/(68-82) 161/81 (03/05 0423) SpO2:  [95 %-100 %] 95 % (03/05 0423) Weight:  [66.5 kg] 66.5 kg (03/05 0441) Last BM Date: 07/10/19  Intake/Output from previous day: 03/04 0701 - 03/05 0700 In: 1799.8 [P.O.:40; I.V.:1759.8] Out: 650 [Urine:650] Intake/Output this shift: No intake/output data recorded.  PE: Abd: soft, minimally tender, incision is c/d/i with staples present, will write to remove these today, +BS, ND  Lab Results:  Recent Labs    07/11/19 0400 07/12/19 0500  WBC 4.8 6.6  HGB 8.4* 9.0*  HCT 25.9* 28.3*  PLT 340 377   BMET Recent Labs    07/11/19 0400 07/12/19 0500  NA 133* 129*  K 4.2 4.2  CL 104 101  CO2 18* 19*  GLUCOSE 110* 134*  BUN 24* 20  CREATININE 0.67 0.71  CALCIUM 8.6* 8.7*   PT/INR No results for input(s): LABPROT, INR in the last 72 hours. CMP     Component Value Date/Time   NA 129 (L) 07/12/2019 0500   NA 142 02/26/2019 1618   K 4.2 07/12/2019 0500   CL 101 07/12/2019 0500   CO2 19 (L) 07/12/2019 0500   GLUCOSE 134 (H) 07/12/2019 0500   BUN 20 07/12/2019 0500   BUN 19 02/26/2019 1618   CREATININE 0.71 07/12/2019 0500   CALCIUM 8.7 (L) 07/12/2019 0500   PROT 6.4 (L) 07/11/2019 0400   PROT 7.7 02/04/2019 1719   ALBUMIN 2.2 (L) 07/11/2019 0400   ALBUMIN 4.1 02/04/2019 1719   AST 108 (H) 07/11/2019 0400   ALT 96 (H) 07/11/2019 0400   ALKPHOS 294 (H) 07/11/2019 0400   BILITOT 0.6 07/11/2019 0400   BILITOT 0.4 02/04/2019 1719   GFRNONAA >60 07/12/2019 0500   GFRAA >60 07/12/2019 0500   Lipase     Component Value Date/Time   LIPASE 38 06/26/2019 1352       Studies/Results: No results found.  Anti-infectives: Anti-infectives (From admission, onward)   Start     Dose/Rate Route Frequency Ordered Stop   07/05/19 1700  Ampicillin-Sulbactam (UNASYN) 3 g in sodium chloride 0.9 % 100 mL IVPB  Status:  Discontinued     3 g 200 mL/hr over 30 Minutes Intravenous Every 6 hours 07/05/19 1619 07/10/19 0715   07/01/19 1015  cefoTEtan (CEFOTAN) 2 g in sodium chloride 0.9 % 100 mL IVPB     2 g 200 mL/hr over 30 Minutes Intravenous On call to O.R. 07/01/19 0847 07/01/19 1234   06/27/19 1000  piperacillin-tazobactam (ZOSYN) IVPB 3.375 g  Status:  Discontinued     3.375 g 12.5 mL/hr over 240 Minutes Intravenous Every 8 hours 06/27/19 0807 06/29/19 1119   06/27/19 0200  piperacillin-tazobactam (ZOSYN) IVPB 2.25 g  Status:  Discontinued     2.25 g 100 mL/hr over 30 Minutes Intravenous Every 8 hours 06/26/19  2012 06/27/19 0807   06/26/19 1700  piperacillin-tazobactam (ZOSYN) IVPB 3.375 g     3.375 g 100 mL/hr over 30 Minutes Intravenous  Once 06/26/19 1651 06/26/19 1823       Assessment/Plan DM Lactic acidosis - resolved AKI - resolved Hypokalemia Protein calorie Malnutrition - prealbumin 12.1 (2/23) H/o Carcinoid tumor s/p ex lapwith bilateral salpingo-oophorectomy, omentectomy and radical debulkingfor suspected ovarian cancer 08/31/2017 Dr. Denman George Dysphagia - needs to be reassessed by speech to see if her diet can be advanced past clears to see if she will eat more.  If she remains with minimal oral intake, then she would need alternative measures for feeding as she can't stay on TNA forever, if her family so chose to pursue that option.  POD 11, S/pEXPLORATORY LAPAROTOMY (N/A)LYSIS OF ADHESIONS2/22 Dr. Marlou Starks for  SBO - Mobilize, continue PT/OT -cont to work with SLP regarding diet, see above -pulm toilet with IS -DC staples today  ID -zosyn 2/17>>2/20, cefotetan periop x1 FEN -IVF,CLD, Boost, TPN VTE -SCDs, lovenox Foley -d/c 2/23 Follow up -Dr. Marlou Starks   LOS: 16 days    Henreitta Cea , [facility] 07/12/2019, 8:37 AM Please see Amion for pager number during day hours 7:00am-4:30pm or 7:00am -11:30am on weekends

## 2019-07-12 NOTE — Progress Notes (Signed)
PHARMACY - TOTAL PARENTERAL NUTRITION CONSULT NOTE   Indication: bowel obstruction  Patient Measurements: Height: '5\' 9"'$  (175.3 cm) Weight: 146 lb 8 oz (66.5 kg) IBW/kg (Calculated) : 66.2 TPN AdjBW (KG): 69 Body mass index is 21.63 kg/m.  Assessment: 84 yo woman with hx of pelvic carcinoid tumor, admitted with N/V, decreased PO intake was found to have SBO and gastric distention.  Glucose / Insulin: hx of type II diabetes, controlled with diet. CBGs all within goal of 100-150, 15 units insulin in TPN,  0 units SSI given in past 24hrs,  Electrolytes: Na 129 slightly low; all other lytes WNL including Corrected Ca (10) Renal: WNL & stable. Lasix 40 IV x 1 on 3/1 and 3/2 LFTs / TGs: AST/ALT 70/85 - trending down , Alk phos 304, TBili WNL, TGs 74 (3/1) Prealbumin / albumin: 16.9> 12.1> 7.6 (3/1)  Intake / Output; MIVF: 3/5 LR at 100 ml/hr added by MD  GI Imaging: 2/27 Abdominal CT: findings c/w SBO 3/1 KUB: ileus vs obstruction, interval improvement in small bowel dilatation Surgeries / Procedures: 2/22 exploratory laparotomy (n/a) lysis of adhesions Central access: PICC 2/22, pt pulled PICC out on 2/23 ~ 2100,  re-insert PICC 2/26 and restart TPN TPN start date: 2/22 -2/23, 2/26 >>   Nutritional Goals RD recs (3/2): KCal: 2000-2200 , Protein: 100-115 gm , Fluid: >/= 2 L/day  Goal TPN rate at 85 mL/hr (provides 102 g of protein and 2039 kcals per day) Protein: 50 g/L Lipids 29g/L Dextrose 15%  Current Nutrition:  CLD ordered > advance diet as tolerated per CCS note;  Boost ordered TID (providing 27 g protein), but pt with minimal PO tolerance. TPN at goal rate  Plan:   Increase TPN to goal rate 85 ml/hr. Increased protein since patient not really tolerating oral feedings, including Boost    Electrolytes in TPN: Increase Na   70 mEq/L of Na, 93mq/L of K, 5104m/L of Ca, 26m35mL of Mg, and 23 mmol/L of Phos. Cl:Ac ratio 1:1  Add trace elements to TPN daily and standard MVI on  MWF only due to national backorder  Continue 15 units insulin in TPN   Continue SSI to q4h,  adjust as needed   IVF per MD, added LR at 100 ml/hr today   BMP, Magnesium, phosphorus with AM labs   Monitor TPN labs on Mon/Thurs  F/u diet tolerance- per CCS note: advance diet as tolerated, still has CLD ordered  F/u ability to wean TPN off   NikRoyetta AsalharmD, BCPS 07/12/2019 9:26 AM

## 2019-07-12 NOTE — Progress Notes (Signed)
Pt had medium brown mucousy stool, PT walked pt to bathroom. Pt currrently in chair and c/o epigastric discomfort and fullness. Intermittent cough, similar to reflux feeling. MD updated. SRP, RN

## 2019-07-12 NOTE — Progress Notes (Signed)
Pt staples removed as ordered, benzoin and steri strips applied, pt tol procedure. Spouse at bedside with pt. SRP, RN

## 2019-07-12 NOTE — TOC Progression Note (Signed)
Transition of Care [facility]) - Progression Note    Patient Details  Name: DEVINEE TING MRN: [id number] Date of Birth: [birth year]  [facility] of [facility]) CM/SW Contact  Purcell Mouton, RN Phone Number: 07/12/2019, 11:05 AM  Clinical Narrative:    Spoke with pt's husband concerning SNF offers. Mr. Wilmott would like to discuss this with daughters and will call me back.    Expected Discharge Plan: Skilled Nursing Facility Barriers to Discharge: Continued Medical Work up  Expected Discharge Plan and Services Expected Discharge Plan: [place]   Discharge Planning Services: CM Consult   Living arrangements for the past 2 months: Single Family Home                 DME Arranged: 3-N-1, Walker rolling DME Agency: [employer] Date DME Agency Contacted: 07/03/19 Time DME Agency Contacted: [phone] Representative spoke with at DME Agency: Thedore Mins HH Arranged: PT, OT           Social Determinants of Health ([place]) Interventions    Readmission Risk Interventions No flowsheet data found.

## 2019-07-13 DIAGNOSIS — R1312 Dysphagia, oropharyngeal phase: Secondary | ICD-10-CM

## 2019-07-13 DIAGNOSIS — R627 Adult failure to thrive: Secondary | ICD-10-CM

## 2019-07-13 DIAGNOSIS — G9341 Metabolic encephalopathy: Secondary | ICD-10-CM

## 2019-07-13 DIAGNOSIS — Z515 Encounter for palliative care: Secondary | ICD-10-CM

## 2019-07-13 DIAGNOSIS — E43 Unspecified severe protein-calorie malnutrition: Secondary | ICD-10-CM

## 2019-07-13 LAB — BASIC METABOLIC PANEL WITH GFR
Anion gap: 6 (ref 5–15)
BUN: 19 mg/dL (ref 8–23)
CO2: 20 mmol/L — ABNORMAL LOW (ref 22–32)
Calcium: 8.6 mg/dL — ABNORMAL LOW (ref 8.9–10.3)
Chloride: 105 mmol/L (ref 98–111)
Creatinine, Ser: 0.62 mg/dL (ref 0.44–1.00)
GFR calc Af Amer: >60 mL/min
GFR calc non Af Amer: >60 mL/min
Glucose, Bld: 118 mg/dL — ABNORMAL HIGH (ref 70–99)
Potassium: 4 mmol/L (ref 3.5–5.1)
Sodium: 131 mmol/L — ABNORMAL LOW (ref 135–145)

## 2019-07-13 LAB — PHOSPHORUS: Phosphorus: 3.3 mg/dL (ref 2.5–4.6)

## 2019-07-13 LAB — AMMONIA: Ammonia: 16 umol/L (ref 9–35)

## 2019-07-13 LAB — GLUCOSE, CAPILLARY
Glucose-Capillary: 100 mg/dL — ABNORMAL HIGH (ref 70–99)
Glucose-Capillary: 102 mg/dL — ABNORMAL HIGH (ref 70–99)
Glucose-Capillary: 105 mg/dL — ABNORMAL HIGH (ref 70–99)
Glucose-Capillary: 106 mg/dL — ABNORMAL HIGH (ref 70–99)
Glucose-Capillary: 108 mg/dL — ABNORMAL HIGH (ref 70–99)
Glucose-Capillary: 94 mg/dL (ref 70–99)

## 2019-07-13 LAB — CBC
HCT: 26.2 % — ABNORMAL LOW (ref 36.0–46.0)
Hemoglobin: 8.5 g/dL — ABNORMAL LOW (ref 12.0–15.0)
MCH: 25.8 pg — ABNORMAL LOW (ref 26.0–34.0)
MCHC: 32.4 g/dL (ref 30.0–36.0)
MCV: 79.6 fL — ABNORMAL LOW (ref 80.0–100.0)
Platelets: 345 10*3/uL (ref 150–400)
RBC: 3.29 MIL/uL — ABNORMAL LOW (ref 3.87–5.11)
RDW: 16.4 % — ABNORMAL HIGH (ref 11.5–15.5)
WBC: 5.7 10*3/uL (ref 4.0–10.5)
nRBC: 0 % (ref 0.0–0.2)

## 2019-07-13 LAB — MAGNESIUM: Magnesium: 1.7 mg/dL (ref 1.7–2.4)

## 2019-07-13 MED ORDER — RESOURCE THICKENUP CLEAR PO POWD
ORAL | Status: DC | PRN
  Filled 2019-07-13: qty 125

## 2019-07-13 MED ORDER — LIP MEDEX EX OINT
1.0000 "application " | TOPICAL_OINTMENT | Freq: Two times a day (BID) | CUTANEOUS | Status: DC
  Administered 2019-07-13 – 2019-07-23 (×21): 1 via TOPICAL
  Filled 2019-07-13 (×5): qty 7

## 2019-07-13 MED ORDER — MEGESTROL ACETATE 400 MG/10ML PO SUSP
400.0000 mg | Freq: Two times a day (BID) | ORAL | Status: DC
  Administered 2019-07-13 – 2019-07-14 (×4): 400 mg via ORAL
  Filled 2019-07-13 (×4): qty 10

## 2019-07-13 MED ORDER — STARCH (THICKENING) PO POWD
ORAL | Status: DC | PRN
  Filled 2019-07-13: qty 227

## 2019-07-13 MED ORDER — MAGIC MOUTHWASH
15.0000 mL | Freq: Four times a day (QID) | ORAL | Status: DC | PRN
  Filled 2019-07-13: qty 15

## 2019-07-13 MED ORDER — SODIUM CHLORIDE 0.9 % IV SOLN: INTRAVENOUS | Status: AC

## 2019-07-13 MED ORDER — TRAVASOL 10 % IV SOLN
INTRAVENOUS | Status: DC
  Filled 2019-07-13: qty 1020

## 2019-07-13 MED ORDER — POLYETHYLENE GLYCOL 3350 17 G PO PACK
17.0000 g | PACK | Freq: Every day | ORAL | Status: DC
  Administered 2019-07-13 – 2019-07-23 (×9): 17 g via ORAL
  Filled 2019-07-13 (×11): qty 1

## 2019-07-13 MED ORDER — BISACODYL 10 MG RE SUPP
10.0000 mg | Freq: Every day | RECTAL | Status: DC
  Administered 2019-07-13 – 2019-07-23 (×10): 10 mg via RECTAL
  Filled 2019-07-13 (×11): qty 1

## 2019-07-13 MED ORDER — ASPIRIN EC 81 MG PO TBEC
81.0000 mg | DELAYED_RELEASE_TABLET | Freq: Every day | ORAL | Status: DC
  Administered 2019-07-13 – 2019-07-23 (×10): 81 mg via ORAL
  Filled 2019-07-13 (×11): qty 1

## 2019-07-13 MED ORDER — ALUM & MAG HYDROXIDE-SIMETH 200-200-20 MG/5ML PO SUSP: 30.0000 mL | Freq: Four times a day (QID) | ORAL | Status: DC | PRN

## 2019-07-13 MED ORDER — CARVEDILOL 6.25 MG PO TABS
6.2500 mg | ORAL_TABLET | Freq: Two times a day (BID) | ORAL | Status: DC
  Administered 2019-07-13 – 2019-07-23 (×18): 6.25 mg via ORAL
  Filled 2019-07-13 (×18): qty 1

## 2019-07-13 MED ORDER — ACETAMINOPHEN 650 MG RE SUPP
650.0000 mg | Freq: Three times a day (TID) | RECTAL | Status: DC
  Administered 2019-07-13 – 2019-07-14 (×4): 650 mg via RECTAL
  Filled 2019-07-13 (×4): qty 1

## 2019-07-13 MED ORDER — MEGESTROL ACETATE 625 MG/5ML PO SUSP: 625.0000 mg | Freq: Every day | ORAL | Status: DC

## 2019-07-13 MED ORDER — PANTOPRAZOLE SODIUM 40 MG PO TBEC
40.0000 mg | DELAYED_RELEASE_TABLET | Freq: Every day | ORAL | Status: DC
  Administered 2019-07-13 – 2019-07-23 (×9): 40 mg via ORAL
  Filled 2019-07-13 (×11): qty 1

## 2019-07-13 MED ORDER — TRAVASOL 10 % IV SOLN
INTRAVENOUS | Status: AC
  Filled 2019-07-13: qty 1020

## 2019-07-13 NOTE — Progress Notes (Signed)
Brief Nutrition Note  Noted that calorie count was ordered on 07/13/19. RD will order calorie count for nursing and will follow up for results on Monday.   Calorie Count Instructions:  Please hang calorie count envelope on the patient's door. Document percent consumed for each item on the patient's meal tray ticket and keep in envelope. Also document percent of any supplement or snack pt consumes and keep documentation in envelope for RD to review.   Larkin Ina, MS, RD, LDN RD pager number and weekend/on-call pager number located in [place].

## 2019-07-13 NOTE — Consult Note (Signed)
Consultation Note Date: 07/13/2019   Patient Name: Gloria Murphy  DOB: 1936-03-03  MRN: [id number]  Age / Sex: 84 y.o., female  PCP: Carlyle Basques, MD Referring Physician: Harold Hedge, MD  Reason for Consultation: Establishing goals of care and Psychosocial/spiritual support  HPI/Patient Profile: 84 y.o. female  with past medical history of carcinoid tumor s/p exploratory laproscopy with radical debulking for suspected ovarian cancer, DM, HTN, and thyroid disease who was admitted on 06/26/2019 with a small bowel obstruction.  She underwent exploratory laproscopy 2/22 with lysis of adhesions.  Unfortunately she suffered SBO recurrence as well as aspiration pneumonia during this hospitalization.  She has severe protein calorie malnutrition.  She had a cor trak in place but removed it herself.  She is taking very little PO due to nausea and the sensation of being full.   PMT was consulted for [place] with the family considering FTT and prior to PEG placement.  Currently she is max assist to pivot to bed side commode.  Clinical Assessment and Goals of Care:  I have reviewed medical records including EPIC notes, labs and imaging, received report from the care team, examined the patient and spoke on the phone with her husband Mishka Stegemann  to discuss diagnosis prognosis, [place], EOL wishes, disposition and options.  I introduced Palliative Medicine as specialized medical care for people living with serious illness. It focuses on providing relief from the symptoms and stress of a serious illness.   Initially I met [name] at bedside. She was very pleasant and responded to a few questions.  She quickly fatigued and stopped being able to answer my questions.  Her words became garbled.  I then called her husband ASA on the phone.  We discussed a brief life review of the patient. He and [name] have been married for almost 3  years and he has known her for 5 years.  This is the second marriage for both of them.  Sharyn has 3 daughters by a previous marriage.  [name] lives locally.  Silva Bandy Cutter [phone]) and Diona Browner [phone]) live in [place].  Diona Fanti describes [name] as fun loving.  She enjoyed concerts Freight forwarder.  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  Diona Fanti is puzzled by her inability to eat as that is so counter to her nature.  We talked about her hospitalization recurrent SBO and aspiration pneumonia.  We discussed a possible feeding tube.  Asa describes [name] as a very particular woman who did not like the tube in her nose.  I explained that we are concerned that without proper nutrition she will not heal.  However, even with the feeding tube we are not sure she will heal and their is still a risk of aspiration.  Asa asked if this was a common thing to do.  I responded that it is not common in an 84 yo.  We talked some more about the pros and cons.   I explained that  the choices are (1) continue as we are doing.  Allowing her to eat what ever she wants.  And (2) place a feeding tube that would provide supplemental artificial nutrition.  I told Diona Fanti there is no right or wrong answer.  A feeding tube may help prolong her life but I'm uncertain that placing a feeding tube will really benefit [name] quality of life.    Finally Diona Fanti asked me to talk with the patient's 3 daughters.  He wanted to leave the decision up to them.   "I want to keep [name] here forever, but I guess that wouldn't be right".  I attempted to call [name] and Alver Sorrow - leaving voice mail messages at both numbers requesting a call back.  Questions and concerns were addressed.  The family was encouraged to call with questions or concerns.     Primary Decision Maker:  NEXT OF KIN Husband Asa as patient does not appear to have capacity.    SUMMARY OF RECOMMENDATIONS     Committed to speak to patient's daughters regarding overall GOC, FTT, and the question of feeding tube placement.  Code Status/Advance Care Planning:  DNR  Symptom Management:   Per primary.  Additional Recommendations (Limitations, Scope, Preferences):  Full Scope Treatment  Palliative Prophylaxis:   Aspiration  Psycho-social/Spiritual:   Desire for further Chaplaincy support:  Welcomed.  Prognosis:  If she continues on her current trajectory her prognosis is very poor likely weeks.  With artificial feeding and hydration I'm uncertain of her prognosis but it would still likely be very poor.   Discharge Planning: To Be Determined      Primary Diagnoses: Present on Admission: . SBO (small bowel obstruction) s/p adhesiolysis 07/01/2019 . Malignant carcinoid tumor to left adnexa . AKI (acute kidney injury) ([place]) . Nausea and vomiting . Abdominal pain . Lactic acidosis . Elevated troponin . Essential hypertension   I have reviewed the medical record, interviewed the patient and family, and examined the patient. The following aspects are pertinent.  Past Medical History:  Diagnosis Date  . Diabetes mellitus without complication ([place])   . High cholesterol   . Hypertension   . Thyroid disease    Social History   Socioeconomic History  . Marital status: Married    Spouse name: Not on file  . Number of children: 3  . Years of education: 25  . Highest education level: High school graduate  Occupational History  . Occupation: Retired  Tobacco Use  . Smoking status: Never Smoker  . Smokeless tobacco: Never Used  Substance and Sexual Activity  . Alcohol use: No  . Drug use: No  . Sexual activity: Not on file  Other Topics Concern  . Not on file  Social History Narrative   Lives at home with her husband.   Right-handed.   Occasional caffeine.   Social Determinants of Health   Financial Resource Strain:   . Difficulty of Paying Living Expenses: Not on  file  Food Insecurity:   . Worried About Charity fundraiser in the Last Year: Not on file  . Ran Out of Food in the Last Year: Not on file  Transportation Needs:   . Lack of Transportation (Medical): Not on file  . Lack of Transportation (Non-Medical): Not on file  Physical Activity:   . Days of Exercise per Week: Not on file  . Minutes of Exercise per Session: Not on file  Stress:   . Feeling of Stress : Not  on file  Social Connections:   . Frequency of Communication with Friends and Family: Not on file  . Frequency of Social Gatherings with Friends and Family: Not on file  . Attends Religious Services: Not on file  . Active Member of Clubs or Organizations: Not on file  . Attends Archivist Meetings: Not on file  . Marital Status: Not on file   Family History  Problem Relation Age of Onset  . Heart disease Mother   . Diabetes Mother   . Kidney failure Father   . Breast cancer Neg Hx    Scheduled Meds: . acetaminophen  650 mg Rectal TID  . amLODipine  10 mg Oral Daily  . aspirin EC  81 mg Oral Daily  . bisacodyl  10 mg Rectal Daily  . chlorhexidine  15 mL Mouth Rinse BID  . Chlorhexidine Gluconate Cloth  6 each Topical Daily  . enoxaparin (LOVENOX) injection  40 mg Subcutaneous Q24H  . feeding supplement  1 Container Oral TID BM  . insulin aspart  0-15 Units Subcutaneous Q4H  . lip balm  1 application Topical BID  . mouth rinse  15 mL Mouth Rinse q12n4p  . megestrol  400 mg Oral BID  . methimazole  5 mg Oral Daily  . methocarbamol  500 mg Oral Q8H  . metoCLOPramide (REGLAN) injection  5 mg Intravenous Q8H  . metoprolol tartrate  25 mg Oral BID  . pantoprazole  40 mg Oral Q1200  . polyethylene glycol  17 g Oral Daily  . polyvinyl alcohol  1 drop Both Eyes TID  . scopolamine  1 patch Transdermal Q72H  . sodium chloride flush  10-40 mL Intracatheter Q12H  . sodium chloride flush  3 mL Intravenous Q12H   Continuous Infusions: . sodium chloride 75 mL/hr at  07/13/19 1143  . TPN ADULT (ION) 85 mL/hr at 07/12/19 1715  . TPN ADULT (ION)     PRN Meds:.alum & mag hydroxide-simeth, labetalol, magic mouthwash, metoprolol tartrate, morphine injection, ondansetron (ZOFRAN) IV, phenol, promethazine, sodium chloride flush, sodium chloride flush No Known Allergies  Review of Systems patient unable to provide.  Denies pain.  Physical Exam  Elderly female, pleasant, lying in bed, NAD CV rrr with murmur Resp no distress Abdomen incision steri stripped and appears clean and dry without erythema.  Abdomen non tender to light palpation, slight distention, firm, decreased bowel sounds.  Vital Signs: BP 135/75 (BP Location: Left Arm)   Pulse (!) 103   Temp 98.2 F (36.8 C) (Oral)   Resp 18   Ht '5\' 9"'$  (1.753 m)   Wt 66.5 kg   SpO2 100%   BMI 21.63 kg/m  Pain Scale: 0-10 POSS *See Group Information*: S-Acceptable,Sleep, easy to arouse Pain Score: Asleep   SpO2: SpO2: 100 % O2 Device:SpO2: 100 % O2 Flow Rate: .O2 Flow Rate (L/min): 2 L/min  IO: Intake/output summary:   Intake/Output Summary (Last 24 hours) at 07/13/2019 1427 Last data filed at 07/13/2019 1100 Gross per 24 hour  Intake 1072.74 ml  Output 100 ml  Net 972.74 ml    LBM: Last BM Date: 07/12/19 Baseline Weight: Weight: 66.1 kg Most recent weight: Weight: 66.5 kg     Palliative Assessment/Data: 20%     Time In: 1:30 Time Out: 2:40  Time Total: 66  Min. Visit consisted of counseling and education dealing with the complex and emotionally intense issues surrounding the need for palliative care and symptom management in the setting of serious  and potentially life-threatening illness. Greater than 50%  of this time was spent counseling and coordinating care related to the above assessment and plan.  Signed by: Florentina Jenny, PA-C Palliative Medicine  Please contact Palliative Medicine Team phone at [phone] for questions and concerns.  For individual provider: See  Shea Evans

## 2019-07-13 NOTE — Progress Notes (Signed)
PHARMACY - TOTAL PARENTERAL NUTRITION CONSULT NOTE   Indication: bowel obstruction  Patient Measurements: Height: '5\' 9"'$  (175.3 cm) Weight: 146 lb 8 oz (66.5 kg) IBW/kg (Calculated) : 66.2 TPN AdjBW (KG): 69 Body mass index is 21.63 kg/m.  Assessment: 84 yo woman with hx of pelvic carcinoid tumor, admitted with N/V, decreased PO intake was found to have SBO and gastric distention.  Glucose / Insulin: hx of type II diabetes, controlled with diet. CBGs all within goal of 100-150, 15 units insulin in TPN,  3 units SSI given in past 24hrs,  Electrolytes: Na 131 slightly low, improved; all other lytes WNL including Corrected Ca (10) Renal: WNL & stable. Lasix 40 IV x 1 on 3/1 and 3/2 LFTs / TGs: AST/ALT 70/85 - trending down , Alk phos 304, TBili WNL, TGs 74 (3/1) Prealbumin / albumin: 16.9> 12.1> 7.6 (3/1)  Intake / Output; MIVF: 3/6 NS at 100 ml/hr added by MD x 12 hours  GI Imaging: 2/27 Abdominal CT: findings c/w SBO 3/1 KUB: ileus vs obstruction, interval improvement in small bowel dilatation Surgeries / Procedures: 2/22 exploratory laparotomy (n/a) lysis of adhesions Central access: PICC 2/22, pt pulled PICC out on 2/23 ~ 2100,  re-insert PICC 2/26 and restart TPN TPN start date: 2/22 -2/23, 2/26 >>   Nutritional Goals RD recs (3/2): KCal: 2000-2200 , Protein: 100-115 gm , Fluid: >/= 2 L/day  Goal TPN rate at 85 mL/hr (provides 102 g of protein and 2039 kcals per day) Protein: 50 g/L Lipids 29g/L Dextrose 15%  Current Nutrition:  CLD ordered > advance diet as tolerated per CCS note;  Boost ordered TID (providing 27 g protein), but pt with minimal PO tolerance. TPN at goal rate  Plan:   Continue TPN to goal rate 85 ml/hr, target rate   Electrolytes in TPN: Increase Na   70 mEq/L of Na, 51mq/L of K, 539m/L of Ca, 47m67mL of Mg, and 23 mmol/L of Phos. Cl:Ac ratio 1:1  Add trace elements to TPN daily and standard MVI on MWF only due to national backorder  Continue 15 units  insulin in TPN   Continue SSI to q4h,  adjust as needed   IVF per MD, NS at 100 ml/hr added by MD x 12 hours   BMP, Magnesium, phosphorus with AM labs   Monitor TPN labs on Mon/Thurs  F/u diet tolerance- per CCS note: advance diet as tolerated  F/u ability to wean TPN off   NikRoyetta AsalharmD, BCPS 07/13/2019 10:36 AM

## 2019-07-13 NOTE — Progress Notes (Addendum)
PROGRESS NOTE    Gloria Murphy    Code Status: DNR  AB:[id number] DOB: [birth year] DOA: 06/26/2019 LOS: 17 days  PCP: Carlyle Basques, MD CC:  Chief Complaint  Patient presents with  . Abdominal Pain  . Nausea  . Emesis       Hospital Summary   84 year old woman with history of carcinoid tumor admitted from home on 2/17 due to nausea and vomiting with decreased p.o. intake.  She was found to have bowel obstruction due to adhesions.  Patient was taken to the OR on 07/01/2019 for lysis of adhesions.  Again she ended up having recurrence of small bowel obstruction.  She has been started on clear liquids on 07/09/2019. Transfer to med telemetry unit 3/3.  She has continued to have poor p.o. intake and had worsening encephalopathy throughout hospitalization.  G-tube is being considered.  Palliative care has been consulted to discuss goals of care and stated patient has poor prognosis, likely weeks at this time.  A & P   Principal Problem:   SBO (small bowel obstruction) s/p adhesiolysis 07/01/2019 Active Problems:   Malignant carcinoid tumor to left adnexa   Essential hypertension   Type 2 diabetes mellitus without complication, without long-term current use of insulin (HCC)   AKI (acute kidney injury) ([place])   Nausea and vomiting   Abdominal pain   Lactic acidosis   Elevated troponin   Sinus tachycardia   Dysphagia, oropharyngeal   FTT (failure to thrive) in adult   Severe protein-calorie malnutrition ([place])   Palliative care encounter   1. Recurrent SBO s/p exploratory laparotomy with lysis of adhesions (07/01/2019), history of multiple abdominal surgeries a. NG tube out 2/28 b. Getting TPN, Still poor p.o. intake c. Staples removed 3/5 d. continue following with SLP e. PT/OT f. May need to consider G-tube if she does not tolerate p.o. intake g. Palliative care consult today for [place] discussion: If she continues on her current trajectory prognosis is very poor, likely weeks.   With artificial feeding and hydration, likely still poor prognosis h. I agree with general surgery restarting Megace 2. Dysuria, resolved without focused treatment  3. AKI secondary to intravascular volume depletion, Resolved with IV fluids 4. Acute metabolic encephalopathy, multifactorial etiology: hyponatremia, hospital-acquired delirium, Medication induced? in setting of dementia a. Palliative care consulted as above b. Delirium precautions c. Hold tramadol which she got last night and this morning d. will check an ammonia level 5. Hypertension a. Started amlodipine, increase b. Switch p.o. Lopressor to Coreg 6. Diet-controlled type 2 diabetes 7. Hyponatremia likely volume depleted a. IV fluids 8. Hypokalemia, resolved 9. Aspiration pneumonia, resolved but still at risk a. Unasyn 2/26-> 3/3 b. Aspiration precautions c. SLP on board 10. Hypothyroidism Methimazole restarted this admission 11. History of pelvic carcinoid tumor followed by Dr. Denman George  DVT prophylaxis: Lovenox Family Communication: Discussed with husband over the phone Disposition Plan:   Patient came from:   Home                                                                                          Anticipated d/c place: TBD  Barriers  to d/c: Further [place] discussions, needs to tolerate p.o. intake and off TPN,  Pressure injury documentation    None  Consultants  General surgery Palliative care  Procedures  NG tube placed 06/26/2019  exploratory laparotomy with lysis of adhesions on 07/01/2019 Staples removed 3/5  Antibiotics   Anti-infectives (From admission, onward)   Start     Dose/Rate Route Frequency Ordered Stop   07/05/19 1700  Ampicillin-Sulbactam (UNASYN) 3 g in sodium chloride 0.9 % 100 mL IVPB  Status:  Discontinued     3 g 200 mL/hr over 30 Minutes Intravenous Every 6 hours 07/05/19 1619 07/10/19 0715   07/01/19 1015  cefoTEtan (CEFOTAN) 2 g in sodium chloride 0.9 % 100 mL IVPB     2  g 200 mL/hr over 30 Minutes Intravenous On call to O.R. 07/01/19 0847 07/01/19 1234   06/27/19 1000  piperacillin-tazobactam (ZOSYN) IVPB 3.375 g  Status:  Discontinued     3.375 g 12.5 mL/hr over 240 Minutes Intravenous Every 8 hours 06/27/19 0807 06/29/19 1119   06/27/19 0200  piperacillin-tazobactam (ZOSYN) IVPB 2.25 g  Status:  Discontinued     2.25 g 100 mL/hr over 30 Minutes Intravenous Every 8 hours 06/26/19 2012 06/27/19 0807   06/26/19 1700  piperacillin-tazobactam (ZOSYN) IVPB 3.375 g     3.375 g 100 mL/hr over 30 Minutes Intravenous  Once 06/26/19 1651 06/26/19 1823        Subjective   Patient much more encephalopathic today and unable to provide much history.  Objective   Vitals:   07/13/19 0402 07/13/19 0553 07/13/19 1122 07/13/19 1157  BP: (!) 144/78  140/74 135/75  Pulse: (!) 117  100 (!) 103  Resp: 19 18    Temp: 98.4 F (36.9 C)   98.2 F (36.8 C)  TempSrc: Oral   Oral  SpO2: 100% 97%  100%  Weight:      Height:        Intake/Output Summary (Last 24 hours) at 07/13/2019 1524 Last data filed at 07/13/2019 1100 Gross per 24 hour  Intake 1072.74 ml  Output 100 ml  Net 972.74 ml   Filed Weights   07/09/19 0500 07/10/19 0500 07/12/19 0441  Weight: 69 kg 66.6 kg 66.5 kg    Examination:  Physical Exam Vitals and nursing note reviewed.  Constitutional:      General: She is not in acute distress.    Appearance: She is not diaphoretic.  HENT:     Head: Normocephalic.  Cardiovascular:     Rate and Rhythm: Regular rhythm. Tachycardia present.     Heart sounds: No murmur.  Pulmonary:     Breath sounds: No wheezing.     Comments: Tachypnea Abdominal:     Tenderness: There is no abdominal tenderness.  Skin:    General: Skin is warm.     Coloration: Skin is not jaundiced.  Neurological:     Mental Status: She is alert. She is disoriented.     Comments: Awake, oriented x1 to place     Data Reviewed: I have personally reviewed following labs and  imaging studies  CBC: Recent Labs  Lab 07/08/19 0500 07/09/19 0500 07/11/19 0400 07/12/19 0500 07/13/19 0359  WBC 6.6 6.2 4.8 6.6 5.7  NEUTROABS 5.3  --   --   --   --   HGB 8.5* 8.9* 8.4* 9.0* 8.5*  HCT 26.4* 26.8* 25.9* 28.3* 26.2*  MCV 80.0 79.5* 79.9* 79.9* 79.6*  PLT 242 264 340 377 345  Basic Metabolic Panel: Recent Labs  Lab 07/09/19 0500 07/10/19 0303 07/11/19 0400 07/12/19 0500 07/13/19 0359  NA 132* 134* 133* 129* 131*  K 3.9 4.1 4.2 4.2 4.0  CL 102 107 104 101 105  CO2 20* 20* 18* 19* 20*  GLUCOSE 131* 119* 110* 134* 118*  BUN 20 26* 24* 20 19  CREATININE 0.55 0.75 0.67 0.71 0.62  CALCIUM 8.5* 8.5* 8.6* 8.7* 8.6*  MG 1.9 2.0 1.9 1.8 1.7  PHOS 3.4 3.6 3.3 3.0 3.3   GFR: Estimated Creatinine Clearance: 57.6 mL/min (by C-G formula based on SCr of 0.62 mg/dL). Liver Function Tests: Recent Labs  Lab 07/08/19 0500 07/11/19 0400 07/12/19 0758  AST 25 108* 70*  ALT 25 96* 85*  ALKPHOS 129* 294* 304*  BILITOT 0.6 0.6 0.6  PROT 5.6* 6.4* 6.8  ALBUMIN 2.0* 2.2* 2.3*   No results for input(s): LIPASE, AMYLASE in the last 168 hours. No results for input(s): AMMONIA in the last 168 hours. Coagulation Profile: No results for input(s): INR, PROTIME in the last 168 hours. Cardiac Enzymes: No results for input(s): CKTOTAL, CKMB, CKMBINDEX, TROPONINI in the last 168 hours. BNP (last 3 results) No results for input(s): PROBNP in the last 8760 hours. HbA1C: No results for input(s): HGBA1C in the last 72 hours. CBG: Recent Labs  Lab 07/12/19 2032 07/13/19 0002 07/13/19 0404 07/13/19 0746 07/13/19 1152  GLUCAP 96 100* 94 102* 105*   Lipid Profile: No results for input(s): CHOL, HDL, LDLCALC, TRIG, CHOLHDL, LDLDIRECT in the last 72 hours. Thyroid Function Tests: No results for input(s): TSH, T4TOTAL, FREET4, T3FREE, THYROIDAB in the last 72 hours. Anemia Panel: No results for input(s): VITAMINB12, FOLATE, FERRITIN, TIBC, IRON, RETICCTPCT in the last 72  hours. Sepsis Labs: Recent Labs  Lab 07/07/19 0327  PROCALCITON 0.99    Recent Results (from the past 240 hour(s))  MRSA PCR Screening     Status: None   Collection Time: 07/05/19  3:59 PM   Specimen: Nasal Mucosa; Nasopharyngeal  Result Value Ref Range Status   MRSA by PCR NEGATIVE NEGATIVE Final    Comment:        The GeneXpert MRSA Assay (FDA approved for NASAL specimens only), is one component of a comprehensive MRSA colonization surveillance program. It is not intended to diagnose MRSA infection nor to guide or monitor treatment for MRSA infections. Performed at [facility], [place] [address] [place], [place] [address]   Culture, blood (routine x 2)     Status: None   Collection Time: 07/05/19  5:34 PM   Specimen: BLOOD LEFT FOREARM  Result Value Ref Range Status   Specimen Description   Final    BLOOD LEFT FOREARM Performed at [facility] [address], [place], [place] [address]    Special Requests   Final    BOTTLES DRAWN AEROBIC ONLY Blood Culture adequate volume Performed at [facility] [address] [place], [place] [address]    Culture   Final    NO GROWTH 5 DAYS Performed at [facility], [place] [address], [place], [place] [address]    Report Status 07/10/2019 FINAL  Final  Culture, blood (routine x 2)     Status: None   Collection Time: 07/05/19  5:34 PM   Specimen: BLOOD  Result Value Ref Range Status   Specimen Description   Final    BLOOD LEFT ANTECUBITAL Performed at [facility] [address] [place],  [place] [address]    Special Requests   Final    BOTTLES DRAWN AEROBIC ONLY Blood Culture adequate volume Performed at [facility] [address] [place], [place] [address]    Culture   Final    NO GROWTH 5 DAYS Performed at [facility], [place] [address] [place], [place] [address]    Report Status 07/10/2019 FINAL  Final    Culture, blood (routine x 2)     Status: None   Collection Time: 07/07/19  6:41 PM   Specimen: BLOOD  Result Value Ref Range Status   Specimen Description   Final    BLOOD LEFT ARM Performed at [facility] [address], [place], [place] [address]    Special Requests   Final    BOTTLES DRAWN AEROBIC ONLY Blood Culture adequate volume Performed at [facility] [address] [place], [place] [address]    Culture   Final    NO GROWTH 5 DAYS Performed at [facility], [place] [address] [place], [place] [address]    Report Status 07/12/2019 FINAL  Final  Culture, blood (routine x 2)     Status: None   Collection Time: 07/07/19  6:41 PM   Specimen: BLOOD  Result Value Ref Range Status   Specimen Description   Final    BLOOD BLOOD LEFT FOREARM Performed at [facility] [address] [place], [place] [address]    Special Requests   Final    BOTTLES DRAWN AEROBIC ONLY Blood Culture adequate volume Performed at [facility] [address], [place], [place] [address]    Culture   Final    NO GROWTH 5 DAYS Performed at [facility], [place] [address] [place], [place] [address]    Report Status 07/12/2019 FINAL  Final         Radiology Studies: No results found.      Scheduled Meds: . acetaminophen  650 mg Rectal TID  . amLODipine  10 mg Oral Daily  . aspirin EC  81 mg Oral Daily  . bisacodyl  10 mg Rectal Daily  . chlorhexidine  15 mL Mouth Rinse BID  . Chlorhexidine Gluconate Cloth  6 each Topical Daily  . enoxaparin (LOVENOX) injection  40 mg Subcutaneous Q24H  . feeding supplement  1 Container Oral TID BM  . insulin aspart  0-15 Units Subcutaneous Q4H  . lip balm  1 application Topical BID  . mouth rinse  15 mL Mouth Rinse q12n4p  . megestrol  400 mg Oral BID  . methimazole  5 mg Oral Daily  . methocarbamol  500 mg Oral Q8H  . metoCLOPramide (REGLAN) injection  5 mg  Intravenous Q8H  . metoprolol tartrate  25 mg Oral BID  . pantoprazole  40 mg Oral Q1200  . polyethylene glycol  17 g Oral Daily  . polyvinyl alcohol  1 drop Both Eyes TID  . scopolamine  1 patch Transdermal Q72H  . sodium chloride flush  10-40 mL Intracatheter Q12H  . sodium chloride flush  3 mL Intravenous Q12H   Continuous Infusions: . sodium chloride 75 mL/hr at 07/13/19 1143  . TPN ADULT (ION) 85 mL/hr at 07/12/19 1715  . TPN ADULT (ION)       Time spent: 30 minutes with over 50% of the time coordinating the patient's care    Harold Hedge, DO [facility] Pager [phone]  Call night  coverage person covering after 7pm

## 2019-07-13 NOTE — Progress Notes (Signed)
Gloria Murphy [id number] 1935-11-25  CARE TEAM:  PCP: Carlyle Basques, MD  Outpatient Care Team: Patient Care Team: Carlyle Basques, MD as PCP - General (Infectious Diseases)  Inpatient Treatment Team: Treatment Team: Attending Provider: Harold Hedge, MD; Consulting Physician: Nolon Nations, MD; Rounding Team: Garner Gavel, MD; Technician: Dewaine Oats, NT; Technician: Heber [place], NT; Technician: Maeola Harman, NT; Registered Nurse: Oleta Mouse, RN; Registered Nurse: Hermina Staggers, RN; Registered Nurse: Talbot Grumbling, RN; Technician: Alma Friendly, NT; Registered Nurse: Zadie Rhine, RN; Speech Language Pathologist: Sonia Baller, CCC-SLP; Technician: Georgette Shell, NT; Occupational Therapist: Apickup-Ot, A, OT; Registered Nurse: Eulas Post, RN   Problem List:   Principal Problem:   SBO (small bowel obstruction) s/p adhesiolysis 07/01/2019 Active Problems:   Malignant carcinoid tumor to left adnexa   Essential hypertension   Type 2 diabetes mellitus without complication, without long-term current use of insulin (HCC)   AKI (acute kidney injury) ([place])   Nausea and vomiting   Abdominal pain   Lactic acidosis   Elevated troponin   Sinus tachycardia   Dysphagia, oropharyngeal   12 Days Post-Op  07/01/2019  PRE-OPERATIVE DIAGNOSIS:  small bowel obstruction  POST-OPERATIVE DIAGNOSIS:  small bowel obstruction  PROCEDURE: EXPLORATORY LAPAROTOMY LYSIS OF ADHESIONS  SURGEON: Jovita Kussmaul, MD   Assessment  Guarded long-term prognosis with failure to thrive  [facility] Stay = 17 days)  Plan:  DM Lactic acidosis - resolved AKI -resolved Hypokalemia Protein calorie Malnutrition - prealbumin 12.1 (2/23) H/o Carcinoid tumor s/p ex lapwith bilateral salpingo-oophorectomy, omentectomy and radical debulkingfor suspected ovarian cancer 08/31/2017 Dr. Denman George    POD 11,S/pEXPLORATORY LAPAROTOMY (N/A)LYSIS OF ADHESIONS2/22 Dr.  Shona Needles SBO - Mobilize, continue PT/OT.  Challenge.  SNF recommended -cont to work with SLP regarding diet.  They were okay with full liquids with nectar thick liquids.  I wrote to advance to  dysphagia 1 diet  -Restart Megace given her chronic anorexia. -IV parenteral nutrition for malnutrition and poor oral intake -It may be that she will need to have enteral nutrition through a feeding gastrostomy tube to wean her off TNA.  Will defer to medicine.  I discussed with Dr. Maryln Gottron with the hospitalist service.  He is asking palliative care to be involved for goals of care which I think is very reasonable.  Should she require a gastrostomy tube, most likely be best served to see if it could be done percutaneously through interventional radiology and avoid a repeat operation a third time.  She has a chronically enlarged stomach with a good window in the left upper quadrant looking at her CT scan this admission -pulm toilet with IS   Dysphagia - being followed by speech therapy.  Okay for full liquid diet nectar thick.  Advance to dysphagia 1 diet for more savory options then the [place] full liquid diet.  Calorie counts.   ID -zosyn 2/17>>2/20, cefotetan periop x1 FEN -IVF,CLD, Boost, TPN VTE -SCDs,lovenox Foley -d/c 2/23 Follow up -Dr. Marlou Starks  30 minutes spent in review, evaluation, examination, counseling, and coordination of care.  More than 50% of that time was spent in counseling.  I updated the patient's status to the Patient and her attending provider, Dr. Maryln Gottron.  Recommendations were made.  Questions were answered.  They expressed understanding & appreciation.   07/13/2019    Subjective: (Chief complaint)  Lying in bed.  Denies much abdominal discomfort.  Again with not much of an appetite.  Objective:  Vital signs:  Vitals:   07/12/19 2302 07/13/19 0047 07/13/19 0402 07/13/19 0553  BP:  (!) 144/70 (!) 144/78   Pulse: 86 98 (!) 117   Resp: 20 20 19 18   Temp:  98 F  (36.7 C) 98.4 F (36.9 C)   TempSrc:  Oral Oral   SpO2: 100% 100% 100% 97%  Weight:      Height:        Last BM Date: 07/12/19  Intake/Output   Yesterday:  03/05 0701 - 03/06 0700 In: 972.7 [P.O.:40; I.V.:932.7] Out: 550 [Urine:550] This shift:  No intake/output data recorded.  Bowel function:  Flatus: YES  BM:  YES  Drain: (No drain)   Physical Exam:  General: Pt awake/alert in no acute distress Eyes: PERRL, normal EOM.  Sclera clear.  No icterus Neuro: CN II-XII intact w/o focal sensory/motor deficits. Lymph: No head/neck/groin lymphadenopathy Psych:  No delerium/psychosis/paranoia.  Oriented x 2 HENT: Normocephalic, Mucus membranes moist.  No thrush Neck: Supple, No tracheal deviation.  No obvious thyromegaly Chest: No pain to chest wall compression.  Good respiratory excursion.  No audible wheezing CV:  Pulses intact.  Regular rhythm.  No major extremity edema MS: Normal AROM mjr joints.  No obvious deformity  Abdomen: Soft.  Mildy distended.  Mild discomfort to deep palpation but certainly no guarding or peritonitis.  Steri-Strips and tract on closed incision with clean dry and intact healing edge.  No evidence of peritonitis.  No incarcerated hernias.  Ext:   No deformity.  No mjr edema.  No cyanosis Skin: No petechiae / purpurea.  No major sores.  Warm and dry    Results:   Cultures: Recent Results (from the past 720 hour(s))  Novel Coronavirus, NAA (Hosp order, Send-out to Ref Lab; TAT 18-24 hrs     Status: None   Collection Time: 06/24/19  9:16 AM   Specimen: Nasopharyngeal Swab; Respiratory  Result Value Ref Range Status   SARS-CoV-2, NAA NOT DETECTED NOT DETECTED Final    Comment: (NOTE) This nucleic acid amplification test was developed and its performance characteristics determined by [employer]. Nucleic acid amplification tests include RT-PCR and TMA. This test has not been FDA cleared or approved. This test has been authorized by  FDA under an Emergency Use Authorization (EUA). This test is only authorized for the duration of time the declaration that circumstances exist justifying the authorization of the emergency use of in vitro diagnostic tests for detection of SARS-CoV-2 virus and/or diagnosis of COVID-19 infection under section 564(b)(1) of the Act, 21 U.S.C. [id number]) (1), unless the authorization is terminated or revoked sooner. When diagnostic testing is negative, the possibility of a false negative result should be considered in the context of a patient's recent exposures and the presence of clinical signs and symptoms consistent with COVID-19. An individual without symptoms of COVID- 19 and who is not shedding SARS-CoV-2  virus would expect to have a negative (not detected) result in this assay. Performed At: [facility] [address] [place], [place] [id number] Rush Farmer MD [id number]    [place]  Final    Comment: Performed at [facility], [place] [address] [place], [place] [address]  Blood culture (routine x 2)     Status: None   Collection Time: 06/26/19  5:33 PM   Specimen: BLOOD  Result Value Ref Range Status   Specimen Description   Final    BLOOD LEFT ANTECUBITAL Performed at [place]  [address], [place], [place] [address]    Special Requests   Final    BOTTLES DRAWN AEROBIC AND ANAEROBIC Blood Culture adequate volume Performed at [place] [address], [place], [place] [address]    Culture   Final    NO GROWTH 5 DAYS Performed at [facility], [place] [address] [place], [place] [address]    Report Status 07/01/2019 FINAL  Final  Respiratory Panel by RT PCR (Flu A&B, Covid) - Nasopharyngeal Swab     Status: None   Collection Time: 06/26/19  6:07 PM   Specimen: Nasopharyngeal Swab  Result Value Ref Range Status   SARS Coronavirus 2 by RT PCR NEGATIVE NEGATIVE Final    Comment:  (NOTE) SARS-CoV-2 target nucleic acids are NOT DETECTED. The SARS-CoV-2 RNA is generally detectable in upper respiratoy specimens during the acute phase of infection. The lowest concentration of SARS-CoV-2 viral copies this assay can detect is 131 copies/mL. A negative result does not preclude SARS-Cov-2 infection and should not be used as the sole basis for treatment or other patient management decisions. A negative result may occur with  improper specimen collection/handling, submission of specimen other than nasopharyngeal swab, presence of viral mutation(s) within the areas targeted by this assay, and inadequate number of viral copies (<131 copies/mL). A negative result must be combined with clinical observations, patient history, and epidemiological information. The expected result is Negative. Fact Sheet for Patients:  PinkCheek.be Fact Sheet for Healthcare Providers:  GravelBags.it This test is not yet ap proved or cleared by the [place] FDA and  has been authorized for detection and/or diagnosis of SARS-CoV-2 by FDA under an Emergency Use Authorization (EUA). This EUA will remain  in effect (meaning this test can be used) for the duration of the COVID-19 declaration under Section 564(b)(1) of the Act, 21 U.S.C. section 360bbb-3(b)(1), unless the authorization is terminated or revoked sooner.    Influenza A by PCR NEGATIVE NEGATIVE Final   Influenza B by PCR NEGATIVE NEGATIVE Final    Comment: (NOTE) The Xpert Xpress SARS-CoV-2/FLU/RSV assay is intended as an aid in  the diagnosis of influenza from Nasopharyngeal swab specimens and  should not be used as a sole basis for treatment. Nasal washings and  aspirates are unacceptable for Xpert Xpress SARS-CoV-2/FLU/RSV  testing. Fact Sheet for Patients: PinkCheek.be Fact Sheet for Healthcare  Providers: GravelBags.it This test is not yet approved or cleared by the [place] FDA and  has been authorized for detection and/or diagnosis of SARS-CoV-2 by  FDA under an Emergency Use Authorization (EUA). This EUA will remain  in effect (meaning this test can be used) for the duration of the  Covid-19 declaration under Section 564(b)(1) of the Act, 21  U.S.C. section 360bbb-3(b)(1), unless the authorization is  terminated or revoked. Performed at [facility], [place] [address], [place], [place] [address]   Blood culture (routine x 2)     Status: None   Collection Time: 06/26/19  6:12 PM   Specimen: Site Not Specified; Blood  Result Value Ref Range Status   Specimen Description   Final    SITE NOT SPECIFIED Performed at [place] [facility] [address], [place], [place] [address]    Special Requests   Final    BOTTLES DRAWN AEROBIC AND ANAEROBIC Blood Culture adequate volume Performed at [place] [address], [place],  [address]    Culture   Final    NO GROWTH  5 DAYS Performed at [facility], [place] [address] [place], [place] [address]    Report Status 07/01/2019 FINAL  Final  MRSA PCR Screening     Status: None   Collection Time: 07/05/19  3:59 PM   Specimen: Nasal Mucosa; Nasopharyngeal  Result Value Ref Range Status   MRSA by PCR NEGATIVE NEGATIVE Final    Comment:        The GeneXpert MRSA Assay (FDA approved for NASAL specimens only), is one component of a comprehensive MRSA colonization surveillance program. It is not intended to diagnose MRSA infection nor to guide or monitor treatment for MRSA infections. Performed at [facility], [place] [address], [place], [place] [address]   Culture, blood (routine x 2)     Status: None   Collection Time: 07/05/19  5:34 PM   Specimen: BLOOD LEFT FOREARM  Result Value Ref Range Status   Specimen  Description   Final    BLOOD LEFT FOREARM Performed at [place] [address] [place], [place] [address]    Special Requests   Final    BOTTLES DRAWN AEROBIC ONLY Blood Culture adequate volume Performed at [place] [address] [place], [place] [address]    Culture   Final    NO GROWTH 5 DAYS Performed at [facility], [place] [address] [place], [place] [address]    Report Status 07/10/2019 FINAL  Final  Culture, blood (routine x 2)     Status: None   Collection Time: 07/05/19  5:34 PM   Specimen: BLOOD  Result Value Ref Range Status   Specimen Description   Final    BLOOD LEFT ANTECUBITAL Performed at [place] [address] [place], [place] [address]    Special Requests   Final    BOTTLES DRAWN AEROBIC ONLY Blood Culture adequate volume Performed at [place] [address] [place], [place] [address]    Culture   Final    NO GROWTH 5 DAYS Performed at [facility], [place] [address] [place], [place] [address]    Report Status 07/10/2019 FINAL  Final  Culture, blood (routine x 2)     Status: None   Collection Time: 07/07/19  6:41 PM   Specimen: BLOOD  Result Value Ref Range Status   Specimen Description   Final    BLOOD LEFT ARM Performed at [place] [address] [place], [place] [address]    Special Requests   Final    BOTTLES DRAWN AEROBIC ONLY Blood Culture adequate volume Performed at [facility] [address] [place], [place] [address]    Culture   Final    NO GROWTH 5 DAYS Performed at [facility], [place] [address] [place], [place] [address]    Report Status 07/12/2019 FINAL  Final  Culture, blood (routine x 2)     Status: None   Collection Time: 07/07/19  6:41 PM   Specimen: BLOOD  Result Value Ref Range Status   Specimen Description   Final    BLOOD BLOOD LEFT FOREARM Performed at [facility] [address] [place], [place] [address]    Special Requests   Final    BOTTLES DRAWN AEROBIC ONLY Blood Culture adequate volume Performed at [place] [address] [place], [place] [address]    Culture   Final    NO  GROWTH 5 DAYS Performed at [facility], [place] [address], [place], [place] [address]    Report Status 07/12/2019 FINAL  Final    Labs: Results for orders placed or performed during the hospital encounter of 06/26/19 (from the past 48 hour(s))  Glucose, capillary     Status: Abnormal   Collection Time: 07/11/19 11:38 AM  Result Value Ref Range   Glucose-Capillary 100 (H) 70 - 99 mg/dL    Comment: Glucose reference range applies only to samples taken after fasting for at least 8 hours.  Urinalysis, Routine w reflex microscopic     Status: None   Collection Time: 07/11/19  1:47 PM  Result Value Ref Range   Color, Urine YELLOW YELLOW   APPearance CLEAR CLEAR   Specific Gravity, Urine 1.021 1.005 - 1.030   pH 6.0 5.0 - 8.0   Glucose, UA NEGATIVE NEGATIVE mg/dL   Hgb urine dipstick NEGATIVE NEGATIVE   Bilirubin Urine NEGATIVE NEGATIVE   Ketones, ur NEGATIVE NEGATIVE mg/dL   Protein, ur NEGATIVE NEGATIVE mg/dL   Nitrite NEGATIVE NEGATIVE   Leukocytes,Ua NEGATIVE NEGATIVE    Comment: Performed at [facility] [address] [place], [place] [address]  Glucose, capillary     Status: Abnormal   Collection Time: 07/11/19  5:30 PM  Result Value Ref Range   Glucose-Capillary 106 (H) 70 - 99 mg/dL    Comment: Glucose reference range applies only to samples taken after fasting for at least 8 hours.  Glucose, capillary     Status: Abnormal   Collection Time: 07/11/19  8:05 PM  Result Value Ref Range   Glucose-Capillary 102 (H) 70 - 99 mg/dL    Comment: Glucose reference range applies only to samples taken after fasting for at least 8 hours.  Glucose, capillary     Status: Abnormal   Collection Time:  07/12/19 12:10 AM  Result Value Ref Range   Glucose-Capillary 116 (H) 70 - 99 mg/dL    Comment: Glucose reference range applies only to samples taken after fasting for at least 8 hours.  Glucose, capillary     Status: Abnormal   Collection Time: 07/12/19  4:11 AM  Result Value Ref Range   Glucose-Capillary 106 (H) 70 - 99 mg/dL    Comment: Glucose reference range applies only to samples taken after fasting for at least 8 hours.  Basic metabolic panel     Status: Abnormal   Collection Time: 07/12/19  5:00 AM  Result Value Ref Range   Sodium 129 (L) 135 - 145 mmol/L   Potassium 4.2 3.5 - 5.1 mmol/L   Chloride 101 98 - 111 mmol/L   CO2 19 (L) 22 - 32 mmol/L   Glucose, Bld 134 (H) 70 - 99 mg/dL    Comment: Glucose reference range applies only to samples taken after fasting for at least 8 hours.   BUN 20 8 - 23 mg/dL   Creatinine, Ser 0.71 0.44 - 1.00 mg/dL   Calcium 8.7 (L) 8.9 - 10.3 mg/dL   GFR calc non Af Amer >60 >60 mL/min   GFR calc Af Amer >60 >60 mL/min   Anion gap 9 5 - 15    Comment: Performed at [facility], [place] [address] [place], [place] [address]  Magnesium     Status: None   Collection Time: 07/12/19  5:00 AM  Result Value Ref Range   Magnesium 1.8 1.7 - 2.4 mg/dL    Comment: Performed at [facility]  [place] [address] [place], [place] [address]  Phosphorus     Status: None   Collection Time: 07/12/19  5:00 AM  Result Value Ref Range   Phosphorus 3.0 2.5 - 4.6 mg/dL    Comment: Performed at [facility], [place] [address] [place], [place] [address]  CBC     Status: Abnormal   Collection Time: 07/12/19  5:00 AM  Result Value Ref Range   WBC 6.6 4.0 - 10.5 K/uL   RBC 3.54 (L) 3.87 - 5.11 MIL/uL   Hemoglobin 9.0 (L) 12.0 - 15.0 g/dL   HCT 28.3 (L) 36.0 - 46.0 %   MCV 79.9 (L) 80.0 - 100.0 fL   MCH 25.4 (L) 26.0 - 34.0 pg   MCHC 31.8 30.0 - 36.0 g/dL   RDW 16.5 (H) 11.5 - 15.5 %   Platelets 377 150 - 400 K/uL    nRBC 0.0 0.0 - 0.2 %    Comment: Performed at [facility], [place] [address], [place], [place] [address]  Glucose, capillary     Status: Abnormal   Collection Time: 07/12/19  7:43 AM  Result Value Ref Range   Glucose-Capillary 118 (H) 70 - 99 mg/dL    Comment: Glucose reference range applies only to samples taken after fasting for at least 8 hours.  Hepatic function panel     Status: Abnormal   Collection Time: 07/12/19  7:58 AM  Result Value Ref Range   Total Protein 6.8 6.5 - 8.1 g/dL   Albumin 2.3 (L) 3.5 - 5.0 g/dL   AST 70 (H) 15 - 41 U/L   ALT 85 (H) 0 - 44 U/L   Alkaline Phosphatase 304 (H) 38 - 126 U/L   Total Bilirubin 0.6 0.3 - 1.2 mg/dL   Bilirubin, Direct 0.3 (H) 0.0 - 0.2 mg/dL   Indirect Bilirubin 0.3 0.3 - 0.9 mg/dL    Comment: Performed at [facility], [place] [address], [place], [place] [address]  Glucose, capillary     Status: Abnormal   Collection Time: 07/12/19 12:01 PM  Result Value Ref Range   Glucose-Capillary 125 (H) 70 - 99 mg/dL    Comment: Glucose reference range applies only to samples taken after fasting for at least 8 hours.  Glucose, capillary     Status: Abnormal   Collection Time: 07/12/19  4:46 PM  Result Value Ref Range   Glucose-Capillary 155 (H) 70 - 99 mg/dL    Comment: Glucose reference range applies only to samples taken after fasting for at least 8 hours.  Glucose, capillary     Status: None   Collection Time: 07/12/19  8:32 PM  Result Value Ref Range   Glucose-Capillary 96 70 - 99 mg/dL    Comment: Glucose reference range applies only to samples taken after fasting for at least 8 hours.  Glucose, capillary     Status: Abnormal   Collection Time: 07/13/19 12:02 AM  Result Value Ref Range   Glucose-Capillary 100 (H) 70 - 99 mg/dL    Comment: Glucose reference range applies only to samples taken after fasting for at least 8 hours.  Basic metabolic panel     Status: Abnormal   Collection Time: 07/13/19   3:59 AM  Result Value Ref Range   Sodium 131 (L) 135 - 145 mmol/L   Potassium 4.0 3.5 - 5.1 mmol/L   Chloride 105 98 - 111 mmol/L   CO2 20 (L) 22 - 32 mmol/L  Glucose, Bld 118 (H) 70 - 99 mg/dL    Comment: Glucose reference range applies only to samples taken after fasting for at least 8 hours.   BUN 19 8 - 23 mg/dL   Creatinine, Ser 0.62 0.44 - 1.00 mg/dL   Calcium 8.6 (L) 8.9 - 10.3 mg/dL   GFR calc non Af Amer >60 >60 mL/min   GFR calc Af Amer >60 >60 mL/min   Anion gap 6 5 - 15    Comment: Performed at [facility], [place] [address] [place], [place] [address]  Phosphorus     Status: None   Collection Time: 07/13/19  3:59 AM  Result Value Ref Range   Phosphorus 3.3 2.5 - 4.6 mg/dL    Comment: Performed at [facility], [place] [address], [place], [place] [address]  Magnesium     Status: None   Collection Time: 07/13/19  3:59 AM  Result Value Ref Range   Magnesium 1.7 1.7 - 2.4 mg/dL    Comment: Performed at [facility], [place] [address], [place], [place] [address]  CBC     Status: Abnormal   Collection Time: 07/13/19  3:59 AM  Result Value Ref Range   WBC 5.7 4.0 - 10.5 K/uL   RBC 3.29 (L) 3.87 - 5.11 MIL/uL   Hemoglobin 8.5 (L) 12.0 - 15.0 g/dL   HCT 26.2 (L) 36.0 - 46.0 %   MCV 79.6 (L) 80.0 - 100.0 fL   MCH 25.8 (L) 26.0 - 34.0 pg   MCHC 32.4 30.0 - 36.0 g/dL   RDW 16.4 (H) 11.5 - 15.5 %   Platelets 345 150 - 400 K/uL   nRBC 0.0 0.0 - 0.2 %    Comment: Performed at [facility], [place] [address], [place],  [address]  Glucose, capillary     Status: None   Collection Time: 07/13/19  4:04 AM  Result Value Ref Range   Glucose-Capillary 94 70 - 99 mg/dL    Comment: Glucose reference range applies only to samples taken after fasting for at least 8 hours.  Glucose, capillary     Status: Abnormal   Collection Time: 07/13/19  7:46 AM  Result Value Ref Range   Glucose-Capillary 102 (H) 70 - 99  mg/dL    Comment: Glucose reference range applies only to samples taken after fasting for at least 8 hours.    Imaging / Studies: No results found.  Medications / Allergies: per chart  Antibiotics: Anti-infectives (From admission, onward)   Start     Dose/Rate Route Frequency Ordered Stop   07/05/19 1700  Ampicillin-Sulbactam (UNASYN) 3 g in sodium chloride 0.9 % 100 mL IVPB  Status:  Discontinued     3 g 200 mL/hr over 30 Minutes Intravenous Every 6 hours 07/05/19 1619 07/10/19 0715   07/01/19 1015  cefoTEtan (CEFOTAN) 2 g in sodium chloride 0.9 % 100 mL IVPB     2 g 200 mL/hr over 30 Minutes Intravenous On call to O.R. 07/01/19 0847 07/01/19 1234   06/27/19 1000  piperacillin-tazobactam (ZOSYN) IVPB 3.375 g  Status:  Discontinued     3.375 g 12.5 mL/hr over 240 Minutes Intravenous Every 8 hours 06/27/19 0807 06/29/19 1119   06/27/19 0200  piperacillin-tazobactam (ZOSYN) IVPB 2.25 g  Status:  Discontinued     2.25 g 100 mL/hr over 30 Minutes Intravenous Every 8 hours 06/26/19 2012 06/27/19 0807   06/26/19 1700  piperacillin-tazobactam (ZOSYN) IVPB 3.375 g  3.375 g 100 mL/hr over 30 Minutes Intravenous  Once 06/26/19 1651 06/26/19 1823        Note: Portions of this report may have been transcribed using voice recognition software. Every effort was made to ensure accuracy; however, inadvertent computerized transcription errors may be present.   Any transcriptional errors that result from this process are unintentional.     Adin Hector, MD, FACS, MASCRS Gastrointestinal and Minimally Invasive Surgery    [address] [place], [place] [address] [phone] Main / Paging [phone] Fax Please see Amion for pager number, especial 5pm - 7am.

## 2019-07-14 DIAGNOSIS — R1312 Dysphagia, oropharyngeal phase: Secondary | ICD-10-CM

## 2019-07-14 DIAGNOSIS — Z66 Do not resuscitate: Secondary | ICD-10-CM

## 2019-07-14 DIAGNOSIS — Z7189 Other specified counseling: Secondary | ICD-10-CM

## 2019-07-14 DIAGNOSIS — C7A00 Malignant carcinoid tumor of unspecified site: Secondary | ICD-10-CM

## 2019-07-14 DIAGNOSIS — R140 Abdominal distension (gaseous): Secondary | ICD-10-CM

## 2019-07-14 DIAGNOSIS — R627 Adult failure to thrive: Secondary | ICD-10-CM

## 2019-07-14 LAB — BASIC METABOLIC PANEL
Anion gap: 5 (ref 5–15)
BUN: 22 mg/dL (ref 8–23)
CO2: 20 mmol/L — ABNORMAL LOW (ref 22–32)
Calcium: 8.6 mg/dL — ABNORMAL LOW (ref 8.9–10.3)
Chloride: 106 mmol/L (ref 98–111)
Creatinine, Ser: 0.45 mg/dL (ref 0.44–1.00)
GFR calc Af Amer: >60 mL/min (ref >60)
GFR calc non Af Amer: >60 mL/min (ref >60)
Glucose, Bld: 118 mg/dL — ABNORMAL HIGH (ref 70–99)
Potassium: 4.1 mmol/L (ref 3.5–5.1)
Sodium: 131 mmol/L — ABNORMAL LOW (ref 135–145)

## 2019-07-14 LAB — CBC
HCT: 25.9 % — ABNORMAL LOW (ref 36.0–46.0)
Hemoglobin: 8.1 g/dL — ABNORMAL LOW (ref 12.0–15.0)
MCH: 25.3 pg — ABNORMAL LOW (ref 26.0–34.0)
MCHC: 31.3 g/dL (ref 30.0–36.0)
MCV: 80.9 fL (ref 80.0–100.0)
Platelets: 315 10*3/uL (ref 150–400)
RBC: 3.2 MIL/uL — ABNORMAL LOW (ref 3.87–5.11)
RDW: 16.5 % — ABNORMAL HIGH (ref 11.5–15.5)
WBC: 5.2 10*3/uL (ref 4.0–10.5)
nRBC: 0 % (ref 0.0–0.2)

## 2019-07-14 LAB — GLUCOSE, CAPILLARY
Glucose-Capillary: 111 mg/dL — ABNORMAL HIGH (ref 70–99)
Glucose-Capillary: 113 mg/dL — ABNORMAL HIGH (ref 70–99)
Glucose-Capillary: 125 mg/dL — ABNORMAL HIGH (ref 70–99)
Glucose-Capillary: 145 mg/dL — ABNORMAL HIGH (ref 70–99)
Glucose-Capillary: 153 mg/dL — ABNORMAL HIGH (ref 70–99)
Glucose-Capillary: 163 mg/dL — ABNORMAL HIGH (ref 70–99)
Glucose-Capillary: 87 mg/dL (ref 70–99)

## 2019-07-14 LAB — PHOSPHORUS: Phosphorus: 3.2 mg/dL (ref 2.5–4.6)

## 2019-07-14 LAB — MAGNESIUM: Magnesium: 1.9 mg/dL (ref 1.7–2.4)

## 2019-07-14 MED ORDER — ACETAMINOPHEN 650 MG RE SUPP
650.0000 mg | Freq: Four times a day (QID) | RECTAL | Status: DC | PRN
  Administered 2019-07-15 – 2019-07-19 (×2): 650 mg via RECTAL
  Filled 2019-07-14 (×2): qty 1

## 2019-07-14 MED ORDER — TRAVASOL 10 % IV SOLN
INTRAVENOUS | Status: AC
  Filled 2019-07-14: qty 1020

## 2019-07-14 NOTE — Progress Notes (Signed)
Daily Progress Note   Patient Name: Gloria Murphy       Date: 07/14/2019 DOB: 03-04-1936  Age: 84 y.o. MRN#: [id number] Attending Physician: Harold Hedge, MD Primary Care Physician: Carlyle Basques, MD Admit Date: 06/26/2019  Reason for Consultation/Follow-up: Establishing goals of care  Subjective: Patient awake and alert in bed. Continuous mumbling of incomprehensible words and fidgeting with covers. Husband is at the bedside. He reports patient was not exhibiting this type of behavior yesterday. Both he and RN reports patient was able to answer some questions appropriately at times and seemed much calmer, less agitated.   Reviewed previous goals of care discussion with him as my colleague has been in discussion with him on previous days. He was able to summarize previous discussion expressing wishes to have her daughters involved in discussions and decision making regarding feeding tubes etc. He states both daughters have come up from [place] and will be in town for at least another week if not longer to make sure patient is taken care of and all decisions are made appropriately.   I attempted to call daughters Katharine Look, [place], and [place]) however, I was only able to connect with Katharine Look. She reports now that her her older sisters are in town they are taking over care and decisions, requesting all calls go through [place] from this point forward. Voicemail have been left for [place] with contact information.   1441: I was able to speak with Silva Bandy who is requesting updates and goals of care discussions with her father and Kenney Houseman. Mr. Heidebrink agrees and is requesting to meet face to face to be able to make the best decisions for [name].   At family's request [place] meeting set for tomorrow 07/15/19  @ 1130am with Beatris Ship, and Mr. Gazzola. They are aware I will meet them at the patient's room. I reviewed in detail the current visitation policy and family is aware they may only be allowed to visit while we are having a much needed meeting, however they will be required to leave afterwards and understand they would not be allowed to re-visit depending on final decisions. Family verbalized understanding and appreciation.   Length of Stay: 18  Current Medications: Scheduled Meds:  . acetaminophen  650 mg Rectal TID  . amLODipine  10 mg Oral Daily  .  aspirin EC  81 mg Oral Daily  . bisacodyl  10 mg Rectal Daily  . carvedilol  6.25 mg Oral BID WC  . chlorhexidine  15 mL Mouth Rinse BID  . Chlorhexidine Gluconate Cloth  6 each Topical Daily  . enoxaparin (LOVENOX) injection  40 mg Subcutaneous Q24H  . feeding supplement  1 Container Oral TID BM  . insulin aspart  0-15 Units Subcutaneous Q4H  . lip balm  1 application Topical BID  . mouth rinse  15 mL Mouth Rinse q12n4p  . megestrol  400 mg Oral BID  . methimazole  5 mg Oral Daily  . methocarbamol  500 mg Oral Q8H  . metoCLOPramide (REGLAN) injection  5 mg Intravenous Q8H  . pantoprazole  40 mg Oral Q1200  . polyethylene glycol  17 g Oral Daily  . polyvinyl alcohol  1 drop Both Eyes TID  . scopolamine  1 patch Transdermal Q72H  . sodium chloride flush  10-40 mL Intracatheter Q12H  . sodium chloride flush  3 mL Intravenous Q12H    Continuous Infusions: . TPN ADULT (ION) 85 mL/hr at 07/13/19 1734  . TPN ADULT (ION)      PRN Meds: alum & mag hydroxide-simeth, labetalol, magic mouthwash, metoprolol tartrate, morphine injection, ondansetron (ZOFRAN) IV, phenol, promethazine, Resource ThickenUp Clear, sodium chloride flush, sodium chloride flush  Physical Exam          Vital Signs: BP 101/84 (BP Location: Left Arm)   Pulse 100   Temp 98.5 F (36.9 C) (Oral)   Resp (!) 24   Ht 5\' 9"  (1.753 m)   Wt 71.5 kg   SpO2 100%   BMI  23.27 kg/m  SpO2: SpO2: 100 % O2 Device: O2 Device: Room Air O2 Flow Rate: O2 Flow Rate (L/min): 2 L/min  Intake/output summary:   Intake/Output Summary (Last 24 hours) at 07/14/2019 1328 Last data filed at 07/14/2019 1300 Gross per 24 hour  Intake 1386.06 ml  Output 400 ml  Net 986.06 ml   LBM: Last BM Date: 07/14/19 Baseline Weight: Weight: 66.1 kg Most recent weight: Weight: 71.5 kg       Palliative Assessment/Data: PPS 20%     Patient Active Problem List   Diagnosis Date Noted  . Dysphagia, oropharyngeal 07/13/2019  . FTT (failure to thrive) in adult   . Severe protein-calorie malnutrition ([place])   . Palliative care encounter   . Sinus tachycardia 06/27/2019  . Type 2 diabetes mellitus without complication, without long-term current use of insulin ([place]) 06/26/2019  . SBO (small bowel obstruction) s/p adhesiolysis 07/01/2019 06/26/2019  . AKI (acute kidney injury) ([place]) 06/26/2019  . Nausea and vomiting 06/26/2019  . Abdominal pain 06/26/2019  . Lactic acidosis 06/26/2019  . Elevated troponin 06/26/2019  . Essential hypertension 02/22/2019  . Exertional dyspnea 02/04/2019  . Vision disturbance 12/19/2018  . TIA (transient ischemic attack) 12/19/2018  . Malignant carcinoid tumor to left adnexa 10/06/2017  . Elevated CA-125 08/31/2017    Palliative Care Assessment & Plan   Recommendations/Plan:  Continue current plan of care per medical team.   [place] meeting tomorrow 3/8 @ 1130pm with family. Patient continues to have poor nutritional intake.   PMT will continue to support and follow.   Goals of Care and Additional Recommendations:  Limitations on Scope of Treatment: Continue to treat the treatable.   Code Status:    Code Status Orders  (From admission, onward)         Start  Ordered   07/05/19 0754  Do not attempt resuscitation (DNR)  Continuous    Question Answer Comment  In the event of cardiac or respiratory ARREST Do not call a "code blue"     In the event of cardiac or respiratory ARREST Do not perform Intubation, CPR, defibrillation or ACLS   In the event of cardiac or respiratory ARREST Use medication by any route, position, wound care, and other measures to relive pain and suffering. May use oxygen, suction and manual treatment of airway obstruction as needed for comfort.      07/05/19 0754        Code Status History    Date Active Date Inactive Code Status Order ID Comments User Context   06/26/2019 1957 07/05/2019 0754 Full Code [id number]  Rhetta Mura DO ED   08/31/2017 1650 09/05/2017 1751 Full Code [id number]  Lahoma Crocker, MD Inpatient   Advance Care Planning Activity       Prognosis:  Guarded to Poor- as previous noted by colleague Haynes Dage, PA-C): If she continues on her current trajectory her prognosis is very poor likely weeks.  With artificial feeding and hydration I'm uncertain of her prognosis but it would still likely be very poor.  Discharge Planning:  To Be Determined  Care plan was discussed with RN, patient's daughters, and husband.   Thank you for allowing the Palliative Medicine Team to assist in the care of this patient.   Time Total: 45 min.   Visit consisted of counseling and education dealing with the complex and emotionally intense issues of symptom management and palliative care in the setting of serious and potentially life-threatening illness.Greater than 50%  of this time was spent counseling and coordinating care related to the above assessment and plan.  Alda Lea, AGPCNP-BC  Palliative Medicine Team [phone]   Please contact Palliative Medicine Team phone at [phone] for questions and concerns.

## 2019-07-14 NOTE — Progress Notes (Signed)
Gloria Murphy [id number] 1936/02/12  CARE TEAM:  PCP: Carlyle Basques, MD  Outpatient Care Team: Patient Care Team: Carlyle Basques, MD as PCP - General (Infectious Diseases) Marcial Pacas, MD as Consulting Physician (Neurology) Nigel Mormon, MD as Consulting Physician (Cardiology) Everitt Amber, MD as Consulting Physician (Gynecologic Oncology) Juanita Craver, MD as Consulting Physician (Gastroenterology)  Inpatient Treatment Team: Treatment Team: Attending Provider: Harold Hedge, MD; Consulting Physician: Nolon Nations, MD; Rounding Team: Garner Gavel, MD; Technician: Dewaine Oats, NT; Technician: Heber Healy, NT; Technician: Maeola Harman, NT; Registered Nurse: Oleta Mouse, RN; Registered Nurse: Hermina Staggers, RN; Registered Nurse: Talbot Grumbling, RN; Technician: Alma Friendly, NT; Registered Nurse: Zadie Rhine, RN; Technician: Georgette Shell, NT; Occupational Therapist: Apickup-Ot, A, OT; Registered Nurse: Mickie Kay, RN; Registered Nurse: Eulas Post, RN   Problem List:   Principal Problem:   SBO (small bowel obstruction) s/p adhesiolysis 07/01/2019 Active Problems:   Malignant carcinoid tumor to left adnexa   Essential hypertension   Type 2 diabetes mellitus without complication, without long-term current use of insulin (HCC)   AKI (acute kidney injury) ([place])   Nausea and vomiting   Abdominal pain   Lactic acidosis   Elevated troponin   Sinus tachycardia   Dysphagia, oropharyngeal   FTT (failure to thrive) in adult   Severe protein-calorie malnutrition ([place])   Palliative care encounter   13 Days Post-Op  07/01/2019  PRE-OPERATIVE DIAGNOSIS:  small bowel obstruction  POST-OPERATIVE DIAGNOSIS:  small bowel obstruction  PROCEDURE: EXPLORATORY LAPAROTOMY LYSIS OF ADHESIONS  SURGEON: Jovita Kussmaul, MD   Assessment  Guarded long-term prognosis with failure to thrive  [facility] Stay = 18 days)  Plan:  DM Lactic  acidosis - resolved AKI -resolved Hypokalemia Protein calorie Malnutrition - prealbumin 12.1 (2/23) H/o Carcinoid tumor s/p ex lapwith bilateral salpingo-oophorectomy, omentectomy and radical debulkingfor suspected ovarian cancer 08/31/2017 Dr. Denman George    POD 11,S/pEXPLORATORY LAPAROTOMY (N/A)LYSIS OF ADHESIONS2/22 Dr. Shona Needles SBO - Mobilize, continue PT/OT.  Challenge.  SNF recommended -cont to work with SLP regarding diet.  They were okay with full liquids with nectar thick liquids.  I wrote to advance to  dysphagia 1 diet  -Cont Megace given her chronic anorexia. -IV parenteral nutrition for malnutrition and poor oral intake -It may be that she will need to have enteral nutrition through a feeding gastrostomy tube to wean her off TNA.  Will defer to medicine.  I discussed with Dr. Maryln Gottron with the hospitalist service. Palliative care following - discussions underway - Husband ASA of 3 years requesting involvement of her sons.  Should she require a gastrostomy tube, most likely be best served to see if it could be done percutaneously through interventional radiology and avoid a repeat operation a third time.  She has a chronically enlarged stomach with a good window in the left upper quadrant looking at her CT scan this admission -pulm toilet with IS   Dysphagia - being followed by speech therapy.  Okay for full liquid diet nectar thick.  Advance to dysphagia 1 diet for more savory options then the [place] full liquid diet.  Calorie counts.   ID -zosyn 2/17>>2/20, cefotetan periop x1 FEN -IVF,CLD, Boost, TPN VTE -SCDs,lovenox Foley -d/c 2/23 Follow up -Dr. Marlou Starks  30 minutes spent in review, evaluation, examination, counseling, and coordination of care.  More than 50% of that time was spent in counseling.  I updated the patient's status to the Patient and her  attending provider, Dr. Maryln Gottron.  Recommendations were made.  Questions were answered.  They expressed understanding &  appreciation.   07/14/2019    Subjective: (Chief complaint)  Lying in bed.  Denies much abdominal discomfort.  Again with not much of an appetite.  Objective:  Vital signs:  Vitals:   07/13/19 1157 07/13/19 2028 07/14/19 0358 07/14/19 0445  BP: 135/75 115/60 (!) 127/59   Pulse: (!) 103 98 89   Resp:   (!) 22   Temp: 98.2 F (36.8 C) 98.4 F (36.9 C) 98.1 F (36.7 C)   TempSrc: Oral Oral Oral   SpO2: 100% (!) 66% 100%   Weight:    71.5 kg  Height:        Last BM Date: 07/14/19  Intake/Output   Yesterday:  03/06 0701 - 03/07 0700 In: 1266.1 [P.O.:220; I.V.:1046.1] Out: 800 [Urine:800] This shift:  No intake/output data recorded.  Bowel function:  Flatus: YES  BM:  YES  Drain: (No drain)   Physical Exam:  General: Pt awake/alert in no acute distress Eyes: PERRL, normal EOM.  Sclera clear.  No icterus Neuro: CN II-XII intact w/o focal sensory/motor deficits. Lymph: No head/neck/groin lymphadenopathy Psych:  No delerium/psychosis/paranoia.  Oriented x 2 HENT: Normocephalic, Mucus membranes moist.  No thrush Neck: Supple, No tracheal deviation.  No obvious thyromegaly Chest: No pain to chest wall compression.  Good respiratory excursion.  No audible wheezing CV:  Pulses intact.  Regular rhythm.  No major extremity edema MS: Normal AROM mjr joints.  No obvious deformity  Abdomen: Soft.  Mildy distended.  Mild discomfort to deep palpation but certainly no guarding or peritonitis.  Steri-Strips and tract on closed incision with clean dry and intact healing edge.  No evidence of peritonitis.  No incarcerated hernias.  Ext:   No deformity.  No mjr edema.  No cyanosis Skin: No petechiae / purpurea.  No major sores.  Warm and dry    Results:   Cultures: Recent Results (from the past 720 hour(s))  Novel Coronavirus, NAA (Hosp order, Send-out to Ref Lab; TAT 18-24 hrs     Status: None   Collection Time: 06/24/19  9:16 AM   Specimen: Nasopharyngeal Swab;  Respiratory  Result Value Ref Range Status   SARS-CoV-2, NAA NOT DETECTED NOT DETECTED Final    Comment: (NOTE) This nucleic acid amplification test was developed and its performance characteristics determined by [employer]. Nucleic acid amplification tests include RT-PCR and TMA. This test has not been FDA cleared or approved. This test has been authorized by FDA under an Emergency Use Authorization (EUA). This test is only authorized for the duration of time the declaration that circumstances exist justifying the authorization of the emergency use of in vitro diagnostic tests for detection of SARS-CoV-2 virus and/or diagnosis of COVID-19 infection under section 564(b)(1) of the Act, 21 U.S.C. [id number]) (1), unless the authorization is terminated or revoked sooner. When diagnostic testing is negative, the possibility of a false negative result should be considered in the context of a patient's recent exposures and the presence of clinical signs and symptoms consistent with COVID-19. An individual without symptoms of COVID- 19 and who is not shedding SARS-CoV-2  virus would expect to have a negative (not detected) result in this assay. Performed At: [facility] [address] [place], [place] [id number] Rush Farmer MD [id number]    [place]  Final    Comment: Performed at [facility], [place] [address] [place], [place]  [address]  Blood culture (routine x 2)     Status: None   Collection Time: 06/26/19  5:33 PM   Specimen: BLOOD  Result Value Ref Range Status   Specimen Description   Final    BLOOD LEFT ANTECUBITAL Performed at [place] [address], [place], [place] [address]    Special Requests   Final    BOTTLES DRAWN AEROBIC AND ANAEROBIC Blood Culture adequate volume Performed at [place] [address] [place], [place] [address]    Culture   Final    NO GROWTH 5  DAYS Performed at [facility], [place] [address], [place], [place] [address]    Report Status 07/01/2019 FINAL  Final  Respiratory Panel by RT PCR (Flu A&B, Covid) - Nasopharyngeal Swab     Status: None   Collection Time: 06/26/19  6:07 PM   Specimen: Nasopharyngeal Swab  Result Value Ref Range Status   SARS Coronavirus 2 by RT PCR NEGATIVE NEGATIVE Final    Comment: (NOTE) SARS-CoV-2 target nucleic acids are NOT DETECTED. The SARS-CoV-2 RNA is generally detectable in upper respiratoy specimens during the acute phase of infection. The lowest concentration of SARS-CoV-2 viral copies this assay can detect is 131 copies/mL. A negative result does not preclude SARS-Cov-2 infection and should not be used as the sole basis for treatment or other patient management decisions. A negative result may occur with  improper specimen collection/handling, submission of specimen other than nasopharyngeal swab, presence of viral mutation(s) within the areas targeted by this assay, and inadequate number of viral copies (<131 copies/mL). A negative result must be combined with clinical observations, patient history, and epidemiological information. The expected result is Negative. Fact Sheet for Patients:  PinkCheek.be Fact Sheet for Healthcare Providers:  GravelBags.it This test is not yet ap proved or cleared by the [place] FDA and  has been authorized for detection and/or diagnosis of SARS-CoV-2 by FDA under an Emergency Use Authorization (EUA). This EUA will remain  in effect (meaning this test can be used) for the duration of the COVID-19 declaration under Section 564(b)(1) of the Act, 21 U.S.C. section 360bbb-3(b)(1), unless the authorization is terminated or revoked sooner.    Influenza A by PCR NEGATIVE NEGATIVE Final   Influenza B by PCR NEGATIVE NEGATIVE Final    Comment: (NOTE) The Xpert Xpress SARS-CoV-2/FLU/RSV assay  is intended as an aid in  the diagnosis of influenza from Nasopharyngeal swab specimens and  should not be used as a sole basis for treatment. Nasal washings and  aspirates are unacceptable for Xpert Xpress SARS-CoV-2/FLU/RSV  testing. Fact Sheet for Patients: PinkCheek.be Fact Sheet for Healthcare Providers: GravelBags.it This test is not yet approved or cleared by the [place] FDA and  has been authorized for detection and/or diagnosis of SARS-CoV-2 by  FDA under an Emergency Use Authorization (EUA). This EUA will remain  in effect (meaning this test can be used) for the duration of the  Covid-19 declaration under Section 564(b)(1) of the Act, 21  U.S.C. section 360bbb-3(b)(1), unless the authorization is  terminated or revoked. Performed at [facility], [place] [address], [place], [place] [address]   Blood culture (routine x 2)     Status: None   Collection Time: 06/26/19  6:12 PM   Specimen: Site Not Specified; Blood  Result Value Ref Range Status   Specimen Description   Final    SITE NOT SPECIFIED Performed at [facility],  [place] [address] [place], [place] [address]    Special Requests   Final    BOTTLES DRAWN AEROBIC AND ANAEROBIC Blood Culture adequate volume Performed at [place] [address] [place], [place] [address]    Culture   Final    NO GROWTH 5 DAYS Performed at [facility], [place] [address], [place], [place] [address]    Report Status 07/01/2019 FINAL  Final  MRSA PCR Screening     Status: None   Collection Time: 07/05/19  3:59 PM   Specimen: Nasal Mucosa; Nasopharyngeal  Result Value Ref Range Status   MRSA by PCR NEGATIVE NEGATIVE Final    Comment:        The GeneXpert MRSA Assay (FDA approved for NASAL specimens only), is one component of a comprehensive MRSA colonization surveillance program. It is not intended to  diagnose MRSA infection nor to guide or monitor treatment for MRSA infections. Performed at [facility], [place] [address] [place], [place] [address]   Culture, blood (routine x 2)     Status: None   Collection Time: 07/05/19  5:34 PM   Specimen: BLOOD LEFT FOREARM  Result Value Ref Range Status   Specimen Description   Final    BLOOD LEFT FOREARM Performed at [place] [address] [place], [place] [address]    Special Requests   Final    BOTTLES DRAWN AEROBIC ONLY Blood Culture adequate volume Performed at [place] [address] [place], [place] [address]    Culture   Final    NO GROWTH 5 DAYS Performed at [facility], [place] [address] [place], [place] [address]    Report Status 07/10/2019 FINAL  Final  Culture, blood (routine x 2)     Status: None   Collection Time: 07/05/19  5:34 PM   Specimen: BLOOD  Result Value Ref Range Status   Specimen Description   Final    BLOOD LEFT ANTECUBITAL Performed at [place] [address] [place], [place] [address]    Special Requests   Final    BOTTLES DRAWN AEROBIC ONLY Blood Culture adequate volume Performed at [place] [address], [place], [place] [address]    Culture   Final    NO GROWTH 5 DAYS Performed at [facility], [place] [address], [place], [place] [address]    Report Status 07/10/2019 FINAL  Final  Culture, blood (routine x 2)     Status: None   Collection Time: 07/07/19  6:41 PM   Specimen: BLOOD  Result Value Ref Range Status   Specimen Description   Final    BLOOD LEFT ARM Performed at [place] [address] [place], [place] [address]    Special Requests   Final    BOTTLES DRAWN AEROBIC ONLY Blood Culture adequate volume Performed at [place] [address], [place], [place] [address]    Culture   Final    NO GROWTH 5  DAYS Performed at [facility], [place] [address] [place],  [address]    Report Status 07/12/2019 FINAL  Final  Culture, blood (routine x 2)     Status: None   Collection Time: 07/07/19  6:41 PM   Specimen: BLOOD  Result Value Ref Range Status   Specimen Description   Final    BLOOD BLOOD LEFT FOREARM Performed at [facility], [place] [address] [place], [place] [address]    Special Requests   Final    BOTTLES DRAWN AEROBIC ONLY Blood Culture adequate volume Performed at [place] [address], [place], [place] [address]    Culture   Final    NO GROWTH 5 DAYS Performed at [facility], [place] [address] [place], [place] [address]    Report Status 07/12/2019 FINAL  Final    Labs: Results for orders placed or performed during the hospital encounter of 06/26/19 (from the past 48 hour(s))  Glucose, capillary     Status: Abnormal   Collection Time: 07/12/19 12:01 PM  Result Value Ref Range   Glucose-Capillary 125 (H) 70 - 99 mg/dL    Comment: Glucose reference range applies only to samples taken after fasting for at least 8 hours.  Glucose, capillary     Status: Abnormal   Collection Time: 07/12/19  4:46 PM  Result Value Ref Range   Glucose-Capillary 155 (H) 70 - 99 mg/dL    Comment: Glucose reference range applies only to samples taken after fasting for at least 8 hours.  Glucose, capillary     Status: None   Collection Time: 07/12/19  8:32 PM  Result Value Ref Range   Glucose-Capillary 96 70 - 99 mg/dL    Comment: Glucose reference range applies only to samples taken after fasting for at least 8 hours.  Glucose, capillary     Status: Abnormal   Collection Time: 07/13/19 12:02 AM  Result Value Ref Range   Glucose-Capillary 100 (H) 70 - 99 mg/dL    Comment: Glucose reference range applies only to samples taken after fasting for at least 8 hours.  Basic metabolic panel     Status: Abnormal   Collection Time: 07/13/19   3:59 AM  Result Value Ref Range   Sodium 131 (L) 135 - 145 mmol/L   Potassium 4.0 3.5 - 5.1 mmol/L   Chloride 105 98 - 111 mmol/L   CO2 20 (L) 22 - 32 mmol/L   Glucose, Bld 118 (H) 70 - 99 mg/dL    Comment: Glucose reference range applies only to samples taken after fasting for at least 8 hours.   BUN 19 8 - 23 mg/dL   Creatinine, Ser 0.62 0.44 - 1.00 mg/dL   Calcium 8.6 (L) 8.9 - 10.3 mg/dL   GFR calc non Af Amer >60 >60 mL/min   GFR calc Af Amer >60 >60 mL/min   Anion gap 6 5 - 15    Comment: Performed at [facility], [place] [address], [place], [place] [address]  Phosphorus     Status: None   Collection Time: 07/13/19  3:59 AM  Result Value Ref Range   Phosphorus 3.3 2.5 - 4.6 mg/dL    Comment: Performed at [facility], [place] [address], [place], [place] [address]  Magnesium     Status: None   Collection Time: 07/13/19  3:59 AM  Result Value Ref Range   Magnesium 1.7 1.7 - 2.4 mg/dL    Comment: Performed at [facility], [place] [address], [place], [place] [address]  CBC     Status: Abnormal   Collection Time: 07/13/19  3:59 AM  Result Value Ref Range   WBC 5.7 4.0 - 10.5 K/uL   RBC 3.29 (L) 3.87 - 5.11 MIL/uL   Hemoglobin 8.5 (L) 12.0 - 15.0 g/dL   HCT 26.2 (L) 36.0 - 46.0 %  MCV 79.6 (L) 80.0 - 100.0 fL   MCH 25.8 (L) 26.0 - 34.0 pg   MCHC 32.4 30.0 - 36.0 g/dL   RDW 16.4 (H) 11.5 - 15.5 %   Platelets 345 150 - 400 K/uL   nRBC 0.0 0.0 - 0.2 %    Comment: Performed at [facility], [place] [address] [place], [place] [address]  Glucose, capillary     Status: None   Collection Time: 07/13/19  4:04 AM  Result Value Ref Range   Glucose-Capillary 94 70 - 99 mg/dL    Comment: Glucose reference range applies only to samples taken after fasting for at least 8 hours.  Glucose, capillary     Status: Abnormal   Collection Time: 07/13/19  7:46 AM  Result Value Ref Range   Glucose-Capillary 102 (H) 70 - 99  mg/dL    Comment: Glucose reference range applies only to samples taken after fasting for at least 8 hours.  Glucose, capillary     Status: Abnormal   Collection Time: 07/13/19 11:52 AM  Result Value Ref Range   Glucose-Capillary 105 (H) 70 - 99 mg/dL    Comment: Glucose reference range applies only to samples taken after fasting for at least 8 hours.  Ammonia     Status: None   Collection Time: 07/13/19  4:00 PM  Result Value Ref Range   Ammonia 16 9 - 35 umol/L    Comment: Performed at [facility], [place] [address] [place], [place] [address]  Glucose, capillary     Status: Abnormal   Collection Time: 07/13/19  4:53 PM  Result Value Ref Range   Glucose-Capillary 106 (H) 70 - 99 mg/dL    Comment: Glucose reference range applies only to samples taken after fasting for at least 8 hours.  Glucose, capillary     Status: Abnormal   Collection Time: 07/13/19  8:29 PM  Result Value Ref Range   Glucose-Capillary 108 (H) 70 - 99 mg/dL    Comment: Glucose reference range applies only to samples taken after fasting for at least 8 hours.  Glucose, capillary     Status: Abnormal   Collection Time: 07/14/19 12:03 AM  Result Value Ref Range   Glucose-Capillary 125 (H) 70 - 99 mg/dL    Comment: Glucose reference range applies only to samples taken after fasting for at least 8 hours.  Glucose, capillary     Status: None   Collection Time: 07/14/19  3:56 AM  Result Value Ref Range   Glucose-Capillary 87 70 - 99 mg/dL    Comment: Glucose reference range applies only to samples taken after fasting for at least 8 hours.  Basic metabolic panel     Status: Abnormal   Collection Time: 07/14/19  4:30 AM  Result Value Ref Range   Sodium 131 (L) 135 - 145 mmol/L   Potassium 4.1 3.5 - 5.1 mmol/L   Chloride 106 98 - 111 mmol/L   CO2 20 (L) 22 - 32 mmol/L   Glucose, Bld 118 (H) 70 - 99 mg/dL    Comment: Glucose reference range applies only to samples taken after fasting for at least 8  hours.   BUN 22 8 - 23 mg/dL   Creatinine, Ser 0.45 0.44 - 1.00 mg/dL   Calcium 8.6 (L) 8.9 - 10.3 mg/dL   GFR calc non Af Amer >60 >60 mL/min   GFR calc Af Amer >60 >60 mL/min   Anion gap 5 5 - 15  Comment: Performed at [facility], [place] [address], [place], [place] [address]  Magnesium     Status: None   Collection Time: 07/14/19  4:30 AM  Result Value Ref Range   Magnesium 1.9 1.7 - 2.4 mg/dL    Comment: Performed at [facility] [place] [address] [place], [place] [address]  Phosphorus     Status: None   Collection Time: 07/14/19  4:30 AM  Result Value Ref Range   Phosphorus 3.2 2.5 - 4.6 mg/dL    Comment: Performed at [facility], [place] [address] [place], [place] [address]  CBC     Status: Abnormal   Collection Time: 07/14/19  4:30 AM  Result Value Ref Range   WBC 5.2 4.0 - 10.5 K/uL   RBC 3.20 (L) 3.87 - 5.11 MIL/uL   Hemoglobin 8.1 (L) 12.0 - 15.0 g/dL   HCT 25.9 (L) 36.0 - 46.0 %   MCV 80.9 80.0 - 100.0 fL   MCH 25.3 (L) 26.0 - 34.0 pg   MCHC 31.3 30.0 - 36.0 g/dL   RDW 16.5 (H) 11.5 - 15.5 %   Platelets 315 150 - 400 K/uL   nRBC 0.0 0.0 - 0.2 %    Comment: Performed at [facility], [place] [address], [place], [place] [address]  Glucose, capillary     Status: Abnormal   Collection Time: 07/14/19  7:47 AM  Result Value Ref Range   Glucose-Capillary 113 (H) 70 - 99 mg/dL    Comment: Glucose reference range applies only to samples taken after fasting for at least 8 hours.    Imaging / Studies: No results found.  Medications / Allergies: per chart  Antibiotics: Anti-infectives (From admission, onward)   Start     Dose/Rate Route Frequency Ordered Stop   07/05/19 1700  Ampicillin-Sulbactam (UNASYN) 3 g in sodium chloride 0.9 % 100 mL IVPB  Status:  Discontinued     3 g 200 mL/hr over 30 Minutes Intravenous Every 6 hours 07/05/19 1619 07/10/19 0715   07/01/19 1015  cefoTEtan (CEFOTAN) 2 g in  sodium chloride 0.9 % 100 mL IVPB     2 g 200 mL/hr over 30 Minutes Intravenous On call to O.R. 07/01/19 0847 07/01/19 1234   06/27/19 1000  piperacillin-tazobactam (ZOSYN) IVPB 3.375 g  Status:  Discontinued     3.375 g 12.5 mL/hr over 240 Minutes Intravenous Every 8 hours 06/27/19 0807 06/29/19 1119   06/27/19 0200  piperacillin-tazobactam (ZOSYN) IVPB 2.25 g  Status:  Discontinued     2.25 g 100 mL/hr over 30 Minutes Intravenous Every 8 hours 06/26/19 2012 06/27/19 0807   06/26/19 1700  piperacillin-tazobactam (ZOSYN) IVPB 3.375 g     3.375 g 100 mL/hr over 30 Minutes Intravenous  Once 06/26/19 1651 06/26/19 1823        Note: Portions of this report may have been transcribed using voice recognition software. Every effort was made to ensure accuracy; however, inadvertent computerized transcription errors may be present.   Any transcriptional errors that result from this process are unintentional.     Nadeen Landau, M.D. [facility] Use [email or web address] to contact on call provider    [address] [place],  [address] [phone] Main / Paging [phone] Fax Please see Amion for pager number, especial 5pm - 7am.

## 2019-07-14 NOTE — Progress Notes (Signed)
PHARMACY - TOTAL PARENTERAL NUTRITION CONSULT NOTE   Indication: bowel obstruction  Patient Measurements: Height: '5\' 9"'$  (175.3 cm) Weight: 157 lb 9.6 oz (71.5 kg) IBW/kg (Calculated) : 66.2 TPN AdjBW (KG): 69 Body mass index is 23.27 kg/m.  Assessment: 84 yo woman with hx of pelvic carcinoid tumor, admitted with N/V, decreased PO intake was found to have SBO and gastric distention.  Glucose / Insulin: hx of type II diabetes, controlled with diet. CBGs all within goal of 100-150, 15 units insulin in TPN,  2 units SSI given in past 24hrs,  Electrolytes: Na 131 slightly low, improved; all other lytes WNL including Corrected Ca (10) Renal: WNL & stable. Lasix 40 IV x 1 on 3/1 and 3/2 LFTs / TGs: AST/ALT 70/85 - trending down , Alk phos 304, TBili WNL, TGs 74 (3/1) Prealbumin / albumin: 16.9> 12.1> 7.6 (3/1)  Intake / Output; MIVF: None at the current time  GI Imaging: 2/27 Abdominal CT: findings c/w SBO 3/1 KUB: ileus vs obstruction, interval improvement in small bowel dilatation Surgeries / Procedures: 2/22 exploratory laparotomy (n/a) lysis of adhesions Central access: PICC 2/22, pt pulled PICC out on 2/23 ~ 2100,  re-insert PICC 2/26 and restart TPN TPN start date: 2/22 -2/23, 2/26 >>   Nutritional Goals RD recs (3/2): KCal: 2000-2200 , Protein: 100-115 gm , Fluid: >/= 2 L/day  Goal TPN rate at 85 mL/hr (provides 102 g of protein and 2039 kcals per day) Protein: 50 g/L Lipids 29g/L Dextrose 15%  Current Nutrition:  CLD ordered > advance diet as tolerated per CCS note;  Boost ordered TID (providing 27 g protein), but pt with minimal PO tolerance. TPN at goal rate  Plan:   Continue TPN to goal rate 85 ml/hr, target rate   Electrolytes in TPN:   70 mEq/L of Na, 71mq/L of K, 579m/L of Ca, 51m65mL of Mg, and 23 mmol/L of Phos. Cl:Ac ratio 1:1  Add trace elements to TPN daily and standard MVI on MWF only due to national backorder  Continue 15 units insulin in TPN   Continue  SSI to q4h,  adjust as needed   IVF per MD, none at the current time  Monitor TPN labs on Mon/Thurs  F/u diet tolerance- per CCS note: advance diet as tolerated  F/u ability to wean TPN off   NikRoyetta AsalharmD, BCPS 07/14/2019 10:48 AM

## 2019-07-14 NOTE — Progress Notes (Addendum)
PROGRESS NOTE    Gloria Murphy    Code Status: DNR  RY:[id number] DOB: 1935/08/26 DOA: 06/26/2019 LOS: 18 days  PCP: Carlyle Basques, MD CC:  Chief Complaint  Patient presents with  . Abdominal Pain  . Nausea  . Emesis       Hospital Summary   84 year old woman with history of carcinoid tumor admitted from home on 2/17 due to nausea and vomiting with decreased p.o. intake.  She was found to have bowel obstruction due to adhesions.  Patient was taken to the OR on 07/01/2019 for lysis of adhesions.  Again she ended up having recurrence of small bowel obstruction.  She has been started on clear liquids on 07/09/2019. Transfer to med telemetry unit 3/3.  She has continued to have poor p.o. intake and had worsening encephalopathy throughout hospitalization.  G-tube is being considered.  Palliative care has been consulted to discuss goals of care and stated patient has poor prognosis, likely weeks at this time.  A & P   Principal Problem:   SBO (small bowel obstruction) s/p adhesiolysis 07/01/2019 Active Problems:   Malignant carcinoid tumor to left adnexa   Essential hypertension   Type 2 diabetes mellitus without complication, without long-term current use of insulin (HCC)   AKI (acute kidney injury) ([place])   Nausea and vomiting   Abdominal pain   Lactic acidosis   Elevated troponin   Sinus tachycardia   Dysphagia, oropharyngeal   FTT (failure to thrive) in adult   Severe protein-calorie malnutrition ([place])   Palliative care encounter   1. Recurrent SBO s/p exploratory laparotomy with lysis of adhesions (07/01/2019), history of multiple abdominal surgeries 2. Failure to thrive a. NG tube out 2/28 b. Getting TPN, Still poor p.o. intake, megace restarted c. Staples removed 3/5 d. continue following with SLP/PT/OT e. May need to consider G-tube if she does not tolerate p.o. intake f. Palliative care consulted for [place], input appreciated 3. Dysuria, resolved without focused  treatment  4. AKI secondary to intravascular volume depletion, Resolved with IV fluids 5. Acute metabolic encephalopathy, multifactorial etiology: hyponatremia, hospital-acquired delirium, Medication induced? in setting of dementia a. Palliative care consulted as above b. Delirium precautions c. Hold tramadol which she got last night and this morning d. will check an ammonia level 6. Hypertension a. Started amlodipine, increase b. Lopressor switched to coreg and tolerating 7. Type 2 diabetes a. Continue sliding scale 8. Hyponatremia likely volume depleted a. IV fluids 9. Hypokalemia, resolved 10. Aspiration pneumonia, resolved but still at risk a. Unasyn 2/26-> 3/3 b. Aspiration precautions c. SLP on board 11. Hyperthyroidism Methimazole restarted this admission.  a. Repeat CMP and if LFTs elevated will discontinue methimazole 12. History of pelvic carcinoid tumor followed by Dr. Denman George  DVT prophylaxis: Lovenox Family Communication: Discussed with husband over the phone yesterday Disposition Plan:   Patient came from:   Home                                                                                          Anticipated d/c place: TBD  Barriers to d/c: Further GOC discussions, needs to tolerate p.o. intake and  off TPN. May need G tube pending palliative input  Pressure injury documentation    None  Consultants  General surgery Palliative care  Procedures  NG tube placed 06/26/2019  exploratory laparotomy with lysis of adhesions on 07/01/2019 Staples removed 3/5  Antibiotics   Anti-infectives (From admission, onward)   Start     Dose/Rate Route Frequency Ordered Stop   07/05/19 1700  Ampicillin-Sulbactam (UNASYN) 3 g in sodium chloride 0.9 % 100 mL IVPB  Status:  Discontinued     3 g 200 mL/hr over 30 Minutes Intravenous Every 6 hours 07/05/19 1619 07/10/19 0715   07/01/19 1015  cefoTEtan (CEFOTAN) 2 g in sodium chloride 0.9 % 100 mL IVPB     2 g 200 mL/hr over  30 Minutes Intravenous On call to O.R. 07/01/19 0847 07/01/19 1234   06/27/19 1000  piperacillin-tazobactam (ZOSYN) IVPB 3.375 g  Status:  Discontinued     3.375 g 12.5 mL/hr over 240 Minutes Intravenous Every 8 hours 06/27/19 0807 06/29/19 1119   06/27/19 0200  piperacillin-tazobactam (ZOSYN) IVPB 2.25 g  Status:  Discontinued     2.25 g 100 mL/hr over 30 Minutes Intravenous Every 8 hours 06/26/19 2012 06/27/19 0807   06/26/19 1700  piperacillin-tazobactam (ZOSYN) IVPB 3.375 g     3.375 g 100 mL/hr over 30 Minutes Intravenous  Once 06/26/19 1651 06/26/19 1823        Subjective   Seen and examined at bedside in no acute distress. Patient without complaints but also pretty altered and unable to provide much history  Objective   Vitals:   07/14/19 0358 07/14/19 0445 07/14/19 1107 07/14/19 1137  BP: (!) 127/59  (!) 153/58 101/84  Pulse: 89  99 100  Resp: (!) 22   (!) 24  Temp: 98.1 F (36.7 C)   98.5 F (36.9 C)  TempSrc: Oral   Oral  SpO2: 100%  (!) 84% 100%  Weight:  71.5 kg    Height:        Intake/Output Summary (Last 24 hours) at 07/14/2019 1543 Last data filed at 07/14/2019 1300 Gross per 24 hour  Intake 1386.06 ml  Output 400 ml  Net 986.06 ml   Filed Weights   07/10/19 0500 07/12/19 0441 07/14/19 0445  Weight: 66.6 kg 66.5 kg 71.5 kg    Examination:  Physical Exam Vitals and nursing note reviewed. Exam conducted with a chaperone present.  Constitutional:      Appearance: She is ill-appearing.  Eyes:     General: No scleral icterus. Cardiovascular:     Rate and Rhythm: Normal rate and regular rhythm.  Pulmonary:     Effort: No respiratory distress.  Abdominal:     General: Bowel sounds are decreased.     Tenderness: There is no abdominal tenderness.  Neurological:     Mental Status: She is alert. She is disoriented.     Data Reviewed: I have personally reviewed following labs and imaging studies  CBC: Recent Labs  Lab 07/08/19 0500  07/08/19 0500 07/09/19 0500 07/11/19 0400 07/12/19 0500 07/13/19 0359 07/14/19 0430  WBC 6.6   < > 6.2 4.8 6.6 5.7 5.2  NEUTROABS 5.3  --   --   --   --   --   --   HGB 8.5*   < > 8.9* 8.4* 9.0* 8.5* 8.1*  HCT 26.4*   < > 26.8* 25.9* 28.3* 26.2* 25.9*  MCV 80.0   < > 79.5* 79.9* 79.9* 79.6* 80.9  PLT 242   < >  264 340 377 345 315   < > = values in this interval not displayed.   Basic Metabolic Panel: Recent Labs  Lab 07/10/19 0303 07/11/19 0400 07/12/19 0500 07/13/19 0359 07/14/19 0430  NA 134* 133* 129* 131* 131*  K 4.1 4.2 4.2 4.0 4.1  CL 107 104 101 105 106  CO2 20* 18* 19* 20* 20*  GLUCOSE 119* 110* 134* 118* 118*  BUN 26* 24* 20 19 22   CREATININE 0.75 0.67 0.71 0.62 0.45  CALCIUM 8.5* 8.6* 8.7* 8.6* 8.6*  MG 2.0 1.9 1.8 1.7 1.9  PHOS 3.6 3.3 3.0 3.3 3.2   GFR: Estimated Creatinine Clearance: 57.6 mL/min (by C-G formula based on SCr of 0.45 mg/dL). Liver Function Tests: Recent Labs  Lab 07/08/19 0500 07/11/19 0400 07/12/19 0758  AST 25 108* 70*  ALT 25 96* 85*  ALKPHOS 129* 294* 304*  BILITOT 0.6 0.6 0.6  PROT 5.6* 6.4* 6.8  ALBUMIN 2.0* 2.2* 2.3*   No results for input(s): LIPASE, AMYLASE in the last 168 hours. Recent Labs  Lab 07/13/19 1600  AMMONIA 16   Coagulation Profile: No results for input(s): INR, PROTIME in the last 168 hours. Cardiac Enzymes: No results for input(s): CKTOTAL, CKMB, CKMBINDEX, TROPONINI in the last 168 hours. BNP (last 3 results) No results for input(s): PROBNP in the last 8760 hours. HbA1C: No results for input(s): HGBA1C in the last 72 hours. CBG: Recent Labs  Lab 07/13/19 2029 07/14/19 0003 07/14/19 0356 07/14/19 0747 07/14/19 1135  GLUCAP 108* 125* 87 113* 111*   Lipid Profile: No results for input(s): CHOL, HDL, LDLCALC, TRIG, CHOLHDL, LDLDIRECT in the last 72 hours. Thyroid Function Tests: No results for input(s): TSH, T4TOTAL, FREET4, T3FREE, THYROIDAB in the last 72 hours. Anemia Panel: No results for  input(s): VITAMINB12, FOLATE, FERRITIN, TIBC, IRON, RETICCTPCT in the last 72 hours. Sepsis Labs: No results for input(s): PROCALCITON, LATICACIDVEN in the last 168 hours.  Recent Results (from the past 240 hour(s))  MRSA PCR Screening     Status: None   Collection Time: 07/05/19  3:59 PM   Specimen: Nasal Mucosa; Nasopharyngeal  Result Value Ref Range Status   MRSA by PCR NEGATIVE NEGATIVE Final    Comment:        The GeneXpert MRSA Assay (FDA approved for NASAL specimens only), is one component of a comprehensive MRSA colonization surveillance program. It is not intended to diagnose MRSA infection nor to guide or monitor treatment for MRSA infections. Performed at [facility], [place] [address], [place], [place] [address]   Culture, blood (routine x 2)     Status: None   Collection Time: 07/05/19  5:34 PM   Specimen: BLOOD LEFT FOREARM  Result Value Ref Range Status   Specimen Description   Final    BLOOD LEFT FOREARM Performed at [facility] [address], [place], [place] [address]    Special Requests   Final    BOTTLES DRAWN AEROBIC ONLY Blood Culture adequate volume Performed at [facility] [address], [place], [place] [address]    Culture   Final    NO GROWTH 5 DAYS Performed at [facility], [place] [address] [place],  [address]    Report Status 07/10/2019 FINAL  Final  Culture, blood (routine x 2)     Status: None   Collection Time: 07/05/19  5:34 PM   Specimen: BLOOD  Result Value Ref Range Status   Specimen Description  Final    BLOOD LEFT ANTECUBITAL Performed at [facility] [address], [place], [place] [address]    Special Requests   Final    BOTTLES DRAWN AEROBIC ONLY Blood Culture adequate volume Performed at [facility] [address] [place], [place] [address]    Culture   Final    NO GROWTH 5 DAYS Performed at [facility], [place] [address], [place], [place] [address]    Report Status 07/10/2019 FINAL  Final  Culture, blood (routine x 2)     Status: None   Collection Time: 07/07/19  6:41 PM   Specimen: BLOOD  Result Value Ref Range Status   Specimen Description   Final    BLOOD LEFT ARM Performed at [facility] [address], [place], [place] [address]    Special Requests   Final    BOTTLES DRAWN AEROBIC ONLY Blood Culture adequate volume Performed at [facility] [address], [place], [place] [address]    Culture   Final    NO GROWTH 5 DAYS Performed at [facility], [place] [address] [place], [place] [address]    Report Status 07/12/2019 FINAL  Final  Culture, blood (routine x 2)     Status: None   Collection Time: 07/07/19  6:41 PM   Specimen: BLOOD  Result Value Ref Range Status   Specimen Description   Final    BLOOD BLOOD LEFT FOREARM Performed at [facility] [address], [place], [place] [address]    Special Requests   Final    BOTTLES DRAWN AEROBIC ONLY Blood Culture adequate volume Performed at [facility] [address], [place], [place] [address]    Culture   Final    NO GROWTH 5 DAYS Performed at [facility], [place] [address], [place],  [address]    Report Status 07/12/2019 FINAL  Final         Radiology Studies: No results found.      Scheduled Meds: . amLODipine  10 mg Oral Daily  . aspirin EC  81 mg Oral Daily  . bisacodyl  10 mg Rectal Daily  . carvedilol  6.25 mg Oral BID WC  . chlorhexidine  15 mL Mouth Rinse BID  . Chlorhexidine Gluconate Cloth  6 each Topical Daily  . enoxaparin (LOVENOX) injection  40 mg Subcutaneous Q24H  . feeding supplement  1 Container Oral TID BM  . insulin aspart  0-15 Units Subcutaneous Q4H  . lip balm  1 application Topical BID  . mouth rinse  15 mL Mouth Rinse q12n4p  . megestrol  400 mg Oral BID  . methimazole  5 mg  Oral Daily  . methocarbamol  500 mg Oral Q8H  . metoCLOPramide (REGLAN) injection  5 mg Intravenous Q8H  . pantoprazole  40 mg Oral Q1200  . polyethylene glycol  17 g Oral Daily  . polyvinyl alcohol  1 drop Both Eyes TID  . scopolamine  1 patch Transdermal Q72H  . sodium chloride flush  10-40 mL Intracatheter Q12H  . sodium chloride flush  3 mL Intravenous Q12H   Continuous Infusions: . TPN ADULT (ION) 85 mL/hr at 07/13/19 1734  . TPN ADULT (ION)       Time spent: 26 minutes with over 50% of the time coordinating the patient's care    Harold Hedge, DO [facility] Pager [phone]  Call night  coverage person covering after 7pm

## 2019-07-15 ENCOUNTER — Inpatient Hospital Stay (HOSPITAL_COMMUNITY): Payer: Medicare Other

## 2019-07-15 DIAGNOSIS — J9601 Acute respiratory failure with hypoxia: Secondary | ICD-10-CM

## 2019-07-15 DIAGNOSIS — R6510 Systemic inflammatory response syndrome (SIRS) of non-infectious origin without acute organ dysfunction: Secondary | ICD-10-CM

## 2019-07-15 LAB — CBC WITH DIFFERENTIAL/PLATELET
Abs Immature Granulocytes: 0.19 10*3/uL — ABNORMAL HIGH (ref 0.00–0.07)
Basophils Absolute: 0 10*3/uL (ref 0.0–0.1)
Basophils Relative: 1 %
Eosinophils Absolute: 0.1 10*3/uL (ref 0.0–0.5)
Eosinophils Relative: 2 %
HCT: 26.8 % — ABNORMAL LOW (ref 36.0–46.0)
Hemoglobin: 8.7 g/dL — ABNORMAL LOW (ref 12.0–15.0)
Immature Granulocytes: 4 %
Lymphocytes Relative: 10 %
Lymphs Abs: 0.6 10*3/uL — ABNORMAL LOW (ref 0.7–4.0)
MCH: 26 pg (ref 26.0–34.0)
MCHC: 32.5 g/dL (ref 30.0–36.0)
MCV: 80 fL (ref 80.0–100.0)
Monocytes Absolute: 0.5 10*3/uL (ref 0.1–1.0)
Monocytes Relative: 9 %
Neutro Abs: 4.1 10*3/uL (ref 1.7–7.7)
Neutrophils Relative %: 74 %
Platelets: 342 10*3/uL (ref 150–400)
RBC: 3.35 MIL/uL — ABNORMAL LOW (ref 3.87–5.11)
RDW: 16.7 % — ABNORMAL HIGH (ref 11.5–15.5)
WBC: 5.4 10*3/uL (ref 4.0–10.5)
nRBC: 0 % (ref 0.0–0.2)

## 2019-07-15 LAB — GLUCOSE, CAPILLARY
Glucose-Capillary: 120 mg/dL — ABNORMAL HIGH (ref 70–99)
Glucose-Capillary: 124 mg/dL — ABNORMAL HIGH (ref 70–99)
Glucose-Capillary: 126 mg/dL — ABNORMAL HIGH (ref 70–99)
Glucose-Capillary: 128 mg/dL — ABNORMAL HIGH (ref 70–99)
Glucose-Capillary: 137 mg/dL — ABNORMAL HIGH (ref 70–99)
Glucose-Capillary: 146 mg/dL — ABNORMAL HIGH (ref 70–99)
Glucose-Capillary: 160 mg/dL — ABNORMAL HIGH (ref 70–99)

## 2019-07-15 LAB — COMPREHENSIVE METABOLIC PANEL
ALT: 70 U/L — ABNORMAL HIGH (ref 0–44)
AST: 60 U/L — ABNORMAL HIGH (ref 15–41)
Albumin: 2.1 g/dL — ABNORMAL LOW (ref 3.5–5.0)
Alkaline Phosphatase: 305 U/L — ABNORMAL HIGH (ref 38–126)
Anion gap: 8 (ref 5–15)
BUN: 22 mg/dL (ref 8–23)
CO2: 18 mmol/L — ABNORMAL LOW (ref 22–32)
Calcium: 8.7 mg/dL — ABNORMAL LOW (ref 8.9–10.3)
Chloride: 106 mmol/L (ref 98–111)
Creatinine, Ser: 0.66 mg/dL (ref 0.44–1.00)
GFR calc Af Amer: >60 mL/min (ref >60)
GFR calc non Af Amer: >60 mL/min (ref >60)
Glucose, Bld: 145 mg/dL — ABNORMAL HIGH (ref 70–99)
Potassium: 4.2 mmol/L (ref 3.5–5.1)
Sodium: 132 mmol/L — ABNORMAL LOW (ref 135–145)
Total Bilirubin: 0.6 mg/dL (ref 0.3–1.2)
Total Protein: 6.6 g/dL (ref 6.5–8.1)

## 2019-07-15 LAB — TRIGLYCERIDES: Triglycerides: 77 mg/dL (ref <150)

## 2019-07-15 LAB — PHOSPHORUS: Phosphorus: 3 mg/dL (ref 2.5–4.6)

## 2019-07-15 LAB — PREALBUMIN: Prealbumin: 7.8 mg/dL — ABNORMAL LOW (ref 18–38)

## 2019-07-15 LAB — LACTIC ACID, PLASMA
Lactic Acid, Venous: 1.1 mmol/L (ref 0.5–1.9)
Lactic Acid, Venous: 1.1 mmol/L (ref 0.5–1.9)

## 2019-07-15 LAB — MAGNESIUM: Magnesium: 1.8 mg/dL (ref 1.7–2.4)

## 2019-07-15 LAB — PROCALCITONIN: Procalcitonin: 0.7 ng/mL

## 2019-07-15 MED ORDER — PIPERACILLIN-TAZOBACTAM 3.375 G IVPB
3.3750 g | Freq: Three times a day (TID) | INTRAVENOUS | Status: DC
  Administered 2019-07-15 – 2019-07-21 (×19): 3.375 g via INTRAVENOUS
  Filled 2019-07-15 (×19): qty 50

## 2019-07-15 MED ORDER — TRAVASOL 10 % IV SOLN
INTRAVENOUS | Status: AC
  Filled 2019-07-15: qty 1020

## 2019-07-15 NOTE — Progress Notes (Signed)
14 Days Post-Op    CC: SBO  Subjective: Pt looks terrible this AM. Dr. Neysa Bonito suspects another aspiration event.  Rapid response this AM due to confusion.  She is tachycardic, BP down respiratory rate is up.  This would be her second aspiration event.    Objective: Vital signs in last 24 hours: Temp:  [98.5 F (36.9 C)-100.3 F (37.9 C)] 99.4 F (37.4 C) (03/08 0753) Pulse Rate:  [89-116] 106 (03/08 0753) Resp:  [22-43] 28 (03/08 0800) BP: (93-153)/(43-87) 93/73 (03/08 0753) SpO2:  [84 %-100 %] 98 % (03/08 0800) Weight:  [67.4 kg] 67.4 kg (03/08 0427) Last BM Date: 07/14/19 420 PO 942 TPN/IV 550 urine recorded BM x 2 TM 100.3; Tachycardia rate around 100-110; BP down Prealbumin 7.8 Na 132, Glucose 145; Alk Phos 305; AST 60; ALT 70: WBC 5.4 H/H 8.7/26.8 Intake/Output from previous day: 03/07 0701 - 03/08 0700 In: 1362.5 [P.O.:420; I.V.:942.5] Out: 550 [Urine:550] Intake/Output this shift: No intake/output data recorded.  General appearance: slightly confused, working to breath, RR is up, on O2 Resp: clear upper lobes she cannot sit up on her own Cardio: regular, but tachycardic GI: soft, steri strips in place, ? minimal distension, BS hyperactive, reported BM Extremities: trace edema lower legs  Lab Results:  Recent Labs    07/14/19 0430 07/15/19 0430  WBC 5.2 5.4  HGB 8.1* 8.7*  HCT 25.9* 26.8*  PLT 315 342    BMET Recent Labs    07/14/19 0430 07/15/19 0430  NA 131* 132*  K 4.1 4.2  CL 106 106  CO2 20* 18*  GLUCOSE 118* 145*  BUN 22 22  CREATININE 0.45 0.66  CALCIUM 8.6* 8.7*   PT/INR No results for input(s): LABPROT, INR in the last 72 hours.  Recent Labs  Lab 07/11/19 0400 07/12/19 0758 07/15/19 0430  AST 108* 70* 60*  ALT 96* 85* 70*  ALKPHOS 294* 304* 305*  BILITOT 0.6 0.6 0.6  PROT 6.4* 6.8 6.6  ALBUMIN 2.2* 2.3* 2.1*     Lipase     Component Value Date/Time   LIPASE 38 06/26/2019 1352     Medications: . amLODipine  10 mg  Oral Daily  . aspirin EC  81 mg Oral Daily  . bisacodyl  10 mg Rectal Daily  . carvedilol  6.25 mg Oral BID WC  . chlorhexidine  15 mL Mouth Rinse BID  . Chlorhexidine Gluconate Cloth  6 each Topical Daily  . enoxaparin (LOVENOX) injection  40 mg Subcutaneous Q24H  . feeding supplement  1 Container Oral TID BM  . insulin aspart  0-15 Units Subcutaneous Q4H  . lip balm  1 application Topical BID  . mouth rinse  15 mL Mouth Rinse q12n4p  . metoCLOPramide (REGLAN) injection  5 mg Intravenous Q8H  . pantoprazole  40 mg Oral Q1200  . polyethylene glycol  17 g Oral Daily  . polyvinyl alcohol  1 drop Both Eyes TID  . sodium chloride flush  10-40 mL Intracatheter Q12H  . sodium chloride flush  3 mL Intravenous Q12H   . piperacillin-tazobactam (ZOSYN)  IV    . TPN ADULT (ION) 85 mL/hr at 07/14/19 1707    Assessment/Plan Recurrent aspiration last PM?  Malignant carcinoid tumor to left adnexa AKI Acute metabolic encephalopathy; multiple factors Hyperthyroid  Essential hypertension Type 2 diabetes mellitus without complication, without long-term current use of insulin (HCC) AKI (acute kidney injury) ([place]) Elevated troponin FTT (failure to thrive) in adult Severe protein-calorie malnutrition - prealbumin  7.8 Anemia     SBO,  Lysis of adhesion s 07/01/19 Dr. Autumn Messing III; POD# 14 -IV parenteral nutrition for malnutrition and poor oral intake -It may be that she will need to have enteral nutrition through a feeding gastrostomy tube to wean her off TNA.  Will defer to medicine.  I discussed with Dr. Neysa Bonito with the hospitalist service. Palliative care following - discussions underway - Husband ASA of 3 years requesting involvement of her sons.  Should she require a gastrostomy tube, most likely be best served to see if it could be done percutaneously through interventional radiology and avoid a repeat operation a third time.  She has a chronically enlarged stomach with a good window in the  left upper quadrant looking at her CT scan this admission -pulm toilet with IS  ID -zosyn 2/17>>2/20, cefotetan periop x1 FEN -IVF,Dysphagia 1, Boost, TPN VTE -SCDs,lovenox Foley -d/c 2/23 Follow up -Dr. Marlou Starks   Plan:  From our standpoint not a lot more to offer.  Dr. Neysa Bonito is getting both chest and abdominal films.  Restarting abx.  Family to discuss plans with Palliative care today, and options today.     LOS: 19 days    Gloria Murphy 07/15/2019 Please see Amion

## 2019-07-15 NOTE — Progress Notes (Signed)
Daily Progress Note   Patient Name: Gloria Murphy       Date: 07/15/2019 DOB: 26-Oct-1935  Age: 84 y.o. MRN#: [id number] Attending Physician: Harold Hedge, MD Primary Care Physician: Carlyle Basques, MD Admit Date: 06/26/2019  Reason for Consultation/Follow-up: Establishing goals of care  Subjective: Patient awake and alert in bed. Stares and will track with eyes. No verbal response, will not follow commands. Mumbles incomprehensible sounds.   Family is at the bedside (daughters-Tonya, Katharine Look, and [place]) and husband, Vaya Gherardi, for continued goals of care discussion. Updates have been provided by attending. Questions answered and support given.   Discussed at length with family patient's current condition, co-morbidities, and prognosis. Educated family on metabolic encephalopathy, decreased functional status, failure to thrive, and patient's overall nutritional decline.   Detailed discussion regarding failure to thrive and decreased nutrition. Patient currently receiving TPN. We discussed G-tube placement, risk, and benefits. I discussed G-tube placement in the setting of advanced age, co-morbidities, and dementia. Family verbalized understanding and appreciation of discussion.   Family tearful and sharing memories of patient. Daughter, Silva Bandy states they have a hard decision to make as they do not want to prolong their mother's life or cause any further suffering or complications to her health. Created space for family to share and express their feelings. Therapeutic listening and support given.   I encouraged family as they expressed wishes to continue to discuss amongst themselves over the next 24 hrs to consider what patient would want for herself and her quality of life. Family  verbalized understanding and appreciation.   Family inquiring about hospice care in the event they chose a more comfort approach. Discussed at length hospice care in the home vs. Residential hospice facility. Family asking about locations. Discussed facilities in the surrounding area. They verbalized understanding.   Hard Choices book given to all family members and printed material on palliative versus hospice.   Family again appreciative and expressing wishes to have time to discuss amongst themselves with follow-up tomorrow.   Length of Stay: 19  Current Medications: Scheduled Meds:  . amLODipine  10 mg Oral Daily  . aspirin EC  81 mg Oral Daily  . bisacodyl  10 mg Rectal Daily  . carvedilol  6.25 mg Oral BID WC  . chlorhexidine  15 mL Mouth Rinse BID  .  Chlorhexidine Gluconate Cloth  6 each Topical Daily  . enoxaparin (LOVENOX) injection  40 mg Subcutaneous Q24H  . feeding supplement  1 Container Oral TID BM  . insulin aspart  0-15 Units Subcutaneous Q4H  . lip balm  1 application Topical BID  . mouth rinse  15 mL Mouth Rinse q12n4p  . metoCLOPramide (REGLAN) injection  5 mg Intravenous Q8H  . pantoprazole  40 mg Oral Q1200  . polyethylene glycol  17 g Oral Daily  . polyvinyl alcohol  1 drop Both Eyes TID  . sodium chloride flush  10-40 mL Intracatheter Q12H  . sodium chloride flush  3 mL Intravenous Q12H    Continuous Infusions: . piperacillin-tazobactam (ZOSYN)  IV 3.375 g (07/15/19 1719)  . TPN ADULT (ION) 85 mL/hr at 07/15/19 1720    PRN Meds: acetaminophen, alum & mag hydroxide-simeth, labetalol, magic mouthwash, metoprolol tartrate, ondansetron (ZOFRAN) IV, phenol, Resource ThickenUp Clear, sodium chloride flush, sodium chloride flush  Physical Exam         -awake, NAD, chronically-ill appearing, thin -diminished bilaterally -tachycardic -not able to assess mentation, will not follow commands  Vital Signs: BP 139/72 (BP Location: Left Arm)   Pulse (!) 103    Temp 99.6 F (37.6 C) (Oral)   Resp (!) 24   Ht 5\' 9"  (1.753 m)   Wt 67.4 kg   SpO2 100%   BMI 21.94 kg/m  SpO2: SpO2: 100 % O2 Device: O2 Device: Room Air O2 Flow Rate: O2 Flow Rate (L/min): 2 L/min  Intake/output summary:   Intake/Output Summary (Last 24 hours) at 07/15/2019 2154 Last data filed at 07/15/2019 2146 Gross per 24 hour  Intake 2173.05 ml  Output 400 ml  Net 1773.05 ml   LBM: Last BM Date: 07/14/19 Baseline Weight: Weight: 66.1 kg Most recent weight: Weight: 67.4 kg       Palliative Assessment/Data: TPN      Patient Active Problem List   Diagnosis Date Noted  . Dysphagia, oropharyngeal 07/13/2019  . FTT (failure to thrive) in adult   . Severe protein-calorie malnutrition ([place])   . Palliative care encounter   . Sinus tachycardia 06/27/2019  . Type 2 diabetes mellitus without complication, without long-term current use of insulin ([place]) 06/26/2019  . SBO (small bowel obstruction) s/p adhesiolysis 07/01/2019 06/26/2019  . AKI (acute kidney injury) ([place]) 06/26/2019  . Nausea and vomiting 06/26/2019  . Abdominal pain 06/26/2019  . Lactic acidosis 06/26/2019  . Elevated troponin 06/26/2019  . Essential hypertension 02/22/2019  . Exertional dyspnea 02/04/2019  . Vision disturbance 12/19/2018  . TIA (transient ischemic attack) 12/19/2018  . Malignant carcinoid tumor to left adnexa 10/06/2017  . Elevated CA-125 08/31/2017    Palliative Care Assessment & Plan   Recommendations/Plan:  DNR/DNI  Continue current plan of care per medical team  Detailed discussion with family regarding prognosis and overall poor functional and nutritional state. PEG education. Family requesting time to discuss amongst themselves to make final decisions. Inquiring about hospice facility vs home with hospice. Request follow-up tomorrow from medical team.   G-tube placement in this patient would not provide any meaningful recovery in the setting of possible underlying dementia  and other co-morbidities. Placement could possibly pro-long life however will not increase quality.   Family requesting to speak with TOC/CSW team. Mrs. Lorenza Chick notified.    PMT will continue to support and follow.   Goals of Care and Additional Recommendations:  Limitations on Scope of Treatment: Full Scope Treatment  Code Status:  Code Status Orders  (From admission, onward)         Start     Ordered   07/05/19 0754  Do not attempt resuscitation (DNR)  Continuous    Question Answer Comment  In the event of cardiac or respiratory ARREST Do not call a "code blue"   In the event of cardiac or respiratory ARREST Do not perform Intubation, CPR, defibrillation or ACLS   In the event of cardiac or respiratory ARREST Use medication by any route, position, wound care, and other measures to relive pain and suffering. May use oxygen, suction and manual treatment of airway obstruction as needed for comfort.      07/05/19 0754        Code Status History    Date Active Date Inactive Code Status Order ID Comments User Context   06/26/2019 1957 07/05/2019 0754 Full Code [id number]  Rhetta Mura DO ED   08/31/2017 1650 09/05/2017 1751 Full Code [id number]  Lahoma Crocker, MD Inpatient   Advance Care Planning Activity       Prognosis:   Guarded to poor-In the setting of failure to thrive and protein calorie malnutrition family has requested time to consider options for G-tube placement or more of a comfort/EOL approach. G-tube placement in this patient would not provide any meaningful recovery in the setting of possible underlying dementia and other co-morbidities. Placement could possibly pro-long life however will not increase quality. If patient continued with limited to no nutrition prognosis is poor with life expectancy to weeks or days. She would be appropriate for hospice support (residential or in home).   Discharge Planning:  To Be Determined  Care plan was discussed  with patient's family and RN.   Thank you for allowing the Palliative Medicine Team to assist in the care of this patient.  Time Total: 65 min.   Visit consisted of counseling and education dealing with the complex and emotionally intense issues of symptom management and palliative care in the setting of serious and potentially life-threatening illness.Greater than 50%  of this time was spent counseling and coordinating care related to the above assessment and plan.  Alda Lea, AGPCNP-BC  Palliative Medicine Team [phone]   Please contact Palliative Medicine Team phone at [phone] for questions and concerns.

## 2019-07-15 NOTE — Evaluation (Signed)
SLP Cancellation Note  Patient Details Name: Gloria Murphy MRN: [id number] DOB: 03/26/1936   Cancelled treatment:       Reason Eval/Treat Not Completed: Fatigue/lethargy limiting ability to participate(pt lethargic and was agitated overnight, is now not alert enough for eval or po)  MBS deferred, thankful palliative following up.   Gloria Lime, MS [facility] SLP Acute Rehab Services Office [phone]    Gloria Murphy 07/15/2019, 7:53 AM

## 2019-07-15 NOTE — Progress Notes (Signed)
Nutrition Follow-up  DOCUMENTATION CODES:   Not applicable  INTERVENTION:  - diet advancement, if feasible. - will monitor for plans/GOC following family meeting with Palliative Care.    NUTRITION DIAGNOSIS:   Increased nutrient needs related to acute illness, post-op healing as evidenced by estimated needs. -ongoing  GOAL:   Patient will meet greater than or equal to 90% of their needs -unmet, unable to meet   MONITOR:   Diet advancement, Labs, Weight trends, Skin  REASON FOR ASSESSMENT:   Consult Calorie Count  ASSESSMENT:   84 year old female admitted from home on 2/17 due to N/V and decreased p.o. intake. In the ED she was found to have a SBO and gastric distention. She was seen by surgery in the ED and NG tube was placed. She has a history of a pelvic carcinoid tumor for which she underwent ex lap in 08/2017 with bilateral salpingo-oophorectomy and omentectomy as well as partial hysterectomy.  Significant Events: 2/17- admission; NGT placed 2/18- initial RD assessment 2/19- NGT replaced and removed 2/20- NGT replaced x3 and removed x2 2/22- ex lap with LOAs; TPN initiation 2/23- diet advanced from NPO to CLD; NGT removed; patient confused and pulled PICC 2/24- diet advanced from CLD to [place] 2/25- double lumen PICC placed in R brachial; downgraded from FLD to CLD 2/26- downgraded from CLD to NPO; NGT placed  2/28- NGT removed; advanced from NPO to CLD and then back to NPO 3/2- advanced from NPO to CLD - abdominal staples removed  3/6- advanced from CLD to Dysphagia 1, nectar-thick   Diet was downgraded to NPO today at 0837 due to recurrent aspiration PNA. She is noted to be a/o to self only. Per flow sheet documentation, she consumed 10% of lunch on 3/6 (97 kcal, 3 grams protein); 10% of breakfast, 20% of lunch, and 25% of dinner on 3/7 (total of 504 kcal, 20 grams protein). Weight has been fairly stable throughout hospitalization and weight today is consistent with  admission (2/17) weight.   She is receiving TPN at goal rate of 85 ml/hr. This regimen provide 2039 kcal, 102 grams protein and meets 100% estimated needs.   Per notes: - plan for family discussion with Palliative today (3/8) - concern for recurrent aspiration PNA - recurrent SBO s/p ex lap with LOAs on 2/22 - AKI and dysuria have resolved - acute metabolic encephalopathy     Labs reviewed; CBGs: 120, 126, and 124 mg/dl, Na: 132 mmol/l, Ca: 8.7 mg/dl, Alk Phos and LFTs elevated. Medications reviewed; sliding scale novolog, 5 mg IV reglan TID, 40 mg oral protonix/day, 1 packet miralax/day.    Diet Order:   Diet Order            Diet NPO time specified  Diet effective now              EDUCATION NEEDS:   No education needs have been identified at this time  Skin:  Skin Assessment: Skin Integrity Issues: Skin Integrity Issues:: Incisions Incisions: abdomen 2/22  Last BM:  3/6  Height:   Ht Readings from Last 1 Encounters:  07/08/19 '5\' 9"'$  (1.753 m)    Weight:   Wt Readings from Last 1 Encounters:  07/15/19 67.4 kg    Ideal Body Weight:  65.9 kg  BMI:  Body mass index is 21.94 kg/m.  Estimated Nutritional Needs:   Kcal:  2000-2200 kcal  Protein:  100-115 grams  Fluid:  >/= 2 L/day     Jarome Matin, MS, RD, LDN,  CNSC Inpatient Clinical Dietitian RD pager # available in AMION  After hours/weekend pager # available in AMION  

## 2019-07-15 NOTE — Progress Notes (Signed)
Palliative Medicine RN Note: Rec'd call from Alver Sorrow to confirm 17 meeting with Lexine Baton, PMT NP.  Called desk & nursing station to notify them of meeting and provide name of people we expect per note & call [name], [address] [place]icia J[place], [place]).  Marjie Skiff Stephano Arrants, RN, BSN, [facility] Palliative Medicine Team 07/15/2019 10:50 AM Office [phone]

## 2019-07-15 NOTE — Progress Notes (Signed)
Physical Therapy Treatment Patient Details Name: Gloria Murphy MRN: [id number] DOB: 03-08-1936 Today's Date: 07/15/2019    History of Present Illness 84 year old woman admitted from home on 2/17 due to nausea and vomiting with decreased p.o. intake. She has a history of a pelvic carcinoid tumor for which she underwent exploratory laparotomy in April 2019 with bilateral salpingo-oophorectomy and omentectomy as well as partial hysterectomy  In the ED she was found to have a large small bowel obstruction and gastric distention.  NG tube was placed, exploratory laparotomy 2/22.     PT Comments    Pt agreeable to sit EOB on arrival, states, "I want to get out of here."  Pt only able to tolerate sitting a few minutes EOB and then requested to return to supine.  Pt felt too weak and tired to transfer to recliner today.  Plan per chart was for palliative meeting with family today.  Pt will need physical assist for mobility upon d/c.    Follow Up Recommendations  SNF     Equipment Recommendations  None recommended by PT    Recommendations for Other Services       Precautions / Restrictions Precautions Precautions: Fall Precaution Comments: per speech therapist NO straws/NO cups .[name].. pt is teaspoon ONLY due to high aspiration risk    Mobility  Bed Mobility Overal bed mobility: Needs Assistance Bed Mobility: Rolling;Sidelying to Sit;Sit to Sidelying Rolling: Mod assist Sidelying to sit: Max assist     Sit to sidelying: Mod assist General bed mobility comments: pt required assist for positioning for log roll technique, pt attempting to assist as able, mild SOB with sitting EOB however SPO2 99% on 2L O2 [place]  Transfers                 General transfer comment: pt declined, feels too weak and tired  Ambulation/Gait                 Stairs             Wheelchair Mobility    Modified Rankin (Stroke Patients Only)       Balance Overall balance assessment: Needs assistance Sitting-balance support: Feet supported;No upper extremity supported Sitting balance-Leahy Scale: Fair Sitting balance - Comments: mostly min/guard, supervision static sitting, able to sit EOB for approx 3 minutes                                    Cognition Arousal/Alertness: Awake/alert Behavior During Therapy: Flat affect Overall Cognitive Status: Impaired/Different from baseline Area of Impairment: Following commands;Problem solving                       Following Commands: Follows one step commands consistently     Problem Solving: Slow processing;Decreased initiation;Difficulty sequencing;Requires verbal cues;Requires tactile cues General Comments: pt agreeable to sit EOB however did not want to transfer today      Exercises      General Comments        Pertinent Vitals/Pain Pain Assessment: Faces Faces Pain Scale: Hurts even more Pain Location: grimacing with movement however did not indicate location of pain Pain Descriptors / Indicators: Guarding;Grimacing Pain Intervention(s): Repositioned;Monitored during session    Home Living                      Prior Function  PT Goals (current goals can now be found in the care plan section) Acute Rehab PT Goals PT Goal Formulation: With patient Time For Goal Achievement: 07/29/19 Potential to Achieve Goals: Fair Progress towards PT goals: Progressing toward goals    Frequency    Min 2X/week      PT Plan Current plan remains appropriate    Co-evaluation              AM-PAC PT "6 Clicks" Mobility   Outcome Measure  Help needed turning from your back to your side while in a flat bed without using bedrails?: A Lot Help needed moving from lying on your back to sitting on the side of a  flat bed without using bedrails?: A Lot Help needed moving to and from a bed to a chair (including a wheelchair)?: A Lot Help needed standing up from a chair using your arms (e.g., wheelchair or bedside chair)?: A Lot Help needed to walk in hospital room?: Total Help needed climbing 3-5 steps with a railing? : Total 6 Click Score: 10    End of Session   Activity Tolerance: Patient limited by fatigue Patient left: in bed;with call bell/phone within reach;with bed alarm set   PT Visit Diagnosis: Other abnormalities of gait and mobility (R26.89)     Time: [id number] PT Time Calculation (min) (ACUTE ONLY): 14 min  Charges:  $Therapeutic Activity: 8-22 mins                    Arlyce Dice, DPT Acute Rehabilitation Services Office: [phone]  Trena Platt 07/15/2019, 3:55 PM

## 2019-07-15 NOTE — Progress Notes (Signed)
PROGRESS NOTE    Gloria Murphy    Code Status: DNR  AB:[id number] DOB: January 11, 1936 DOA: 06/26/2019 LOS: 19 days  PCP: Carlyle Basques, MD CC:  Chief Complaint  Patient presents with  . Abdominal Pain  . Nausea  . Emesis       Hospital Summary   84 year old woman with history of carcinoid tumor admitted from home on 2/17 due to nausea and vomiting with decreased p.o. intake.  She was found to have bowel obstruction due to adhesions.  Patient was taken to the OR on 07/01/2019 for lysis of adhesions.  Again she ended up having recurrence of small bowel obstruction.  She has been started on clear liquids on 07/09/2019. Transfer to med telemetry unit 3/3.  She has continued to have poor p.o. intake and had worsening encephalopathy throughout hospitalization.  G-tube is being considered.  Palliative care has been consulted to discuss goals of care and stated patient has poor prognosis, likely weeks at this time.  3/8: Overnight rapid response and SIRS positive (tachycardia, tachypnea, hypoxia, still delirious)  A & P   Principal Problem:   SBO (small bowel obstruction) s/p adhesiolysis 07/01/2019 Active Problems:   Malignant carcinoid tumor to left adnexa   Essential hypertension   Type 2 diabetes mellitus without complication, without long-term current use of insulin (HCC)   AKI (acute kidney injury) ([place])   Nausea and vomiting   Abdominal pain   Lactic acidosis   Elevated troponin   Sinus tachycardia   Dysphagia, oropharyngeal   FTT (failure to thrive) in adult   Severe protein-calorie malnutrition ([place])   Palliative care encounter   1. SIRS / Acute hypoxic respiratory failure, concern for recurrent aspiration pneumonia likely developing Sepsis a. Completed Unasyn 2/26->3/3 for aspiration pneumonia, CXR on 3/1 vague opacities on left and right lungs concerning for possible pneumonia, seemed to have resolved with additional antibiotics to 3/3 b. Diet advanced several days ago to  dys 1 c. 3/8: Overnight rapid response and SIRS positive (tachycardia, tachypnea, hypoxia, has low grade fever, still delirious) d. Will get lactic acid, blood cultures, Zosyn, procalcitonin, hold off fluids for now, NPO e. Family discussion with palliative today 2. Recurrent SBO s/p exploratory laparotomy with lysis of adhesions (07/01/2019), history of multiple abdominal surgeries / Failure to thrive a. NG tube out 2/28 b. Getting TPN, Still poor p.o. intake, megace restarted c. Staples removed 3/5 d. continue following with SLP/PT/OT e. May need to consider G-tube if she does not tolerate p.o. intake f. Palliative care consulted for [place], family discussion today 3. Dysuria, resolved without focused treatment  4. AKI secondary to intravascular volume depletion, Resolved with IV fluids 5. Acute metabolic encephalopathy, multifactorial etiology: hyponatremia, hospital-acquired delirium, Medication induced, SIRS, Hypoxia in setting of dementia a. Palliative care consulted as above b. Delirium precautions c. Discontinue: megace, methimazole, morphine, methocarbamol, tramadol 6. Hypertension a. Continue amlodipine started this admission b. Lopressor switched to coreg and tolerating 7. Type 2 diabetes a. Continue sliding scale 8. Hyponatremia improved  9. Hypokalemia, resolved 10. Hyperthyroidism Methimazole restarted this admission.  a. LFTs still elevated, will discontinue methimazole 11. Elevated LFTs, possibly from TPN 12. History of pelvic carcinoid tumor followed by Dr. Denman George  DVT prophylaxis: Lovenox Family Communication: Discussed with husband today Disposition Plan:   Patient came from:   Home  Anticipated d/c place: TBD  Barriers to d/c: Further [place] discussions, IV antibiotics, TPN/nutrition  Pressure injury documentation    None  Consultants  General surgery Palliative  care  Procedures  NG tube placed 06/26/2019  exploratory laparotomy with lysis of adhesions on 07/01/2019 Staples removed 3/5  Antibiotics   Anti-infectives (From admission, onward)   Start     Dose/Rate Route Frequency Ordered Stop   07/15/19 1000  piperacillin-tazobactam (ZOSYN) IVPB 3.375 g     3.375 g 12.5 mL/hr over 240 Minutes Intravenous Every 8 hours 07/15/19 0825     07/05/19 1700  Ampicillin-Sulbactam (UNASYN) 3 g in sodium chloride 0.9 % 100 mL IVPB  Status:  Discontinued     3 g 200 mL/hr over 30 Minutes Intravenous Every 6 hours 07/05/19 1619 07/10/19 0715   07/01/19 1015  cefoTEtan (CEFOTAN) 2 g in sodium chloride 0.9 % 100 mL IVPB     2 g 200 mL/hr over 30 Minutes Intravenous On call to O.R. 07/01/19 0847 07/01/19 1234   06/27/19 1000  piperacillin-tazobactam (ZOSYN) IVPB 3.375 g  Status:  Discontinued     3.375 g 12.5 mL/hr over 240 Minutes Intravenous Every 8 hours 06/27/19 0807 06/29/19 1119   06/27/19 0200  piperacillin-tazobactam (ZOSYN) IVPB 2.25 g  Status:  Discontinued     2.25 g 100 mL/hr over 30 Minutes Intravenous Every 8 hours 06/26/19 2012 06/27/19 0807   06/26/19 1700  piperacillin-tazobactam (ZOSYN) IVPB 3.375 g     3.375 g 100 mL/hr over 30 Minutes Intravenous  Once 06/26/19 1651 06/26/19 1823        Subjective   Rapid response overnight for agitation, found to have tachycardia, tachypnea. Placed on O2. Family discussion with palliative today.   At bedside, patient awake but lethargic. Denies any specific complaints when asked but not able to provide much history.   Objective   Vitals:   07/15/19 0427 07/15/19 0631 07/15/19 0753 07/15/19 0800  BP:  105/73 93/73   Pulse:  (!) 111 (!) 106   Resp:  (!) 22 (!) 43 (!) 28  Temp:  100.3 F (37.9 C) 99.4 F (37.4 C)   TempSrc:   Oral   SpO2:  100% 100% 98%  Weight: 67.4 kg     Height:        Intake/Output Summary (Last 24 hours) at 07/15/2019 0828 Last data filed at 07/14/2019 2137 Gross per  24 hour  Intake 1362.52 ml  Output 550 ml  Net 812.52 ml   Filed Weights   07/12/19 0441 07/14/19 0445 07/15/19 0427  Weight: 66.5 kg 71.5 kg 67.4 kg    Examination:  Physical Exam Vitals and nursing note reviewed.  Constitutional:      Appearance: She is ill-appearing and toxic-appearing.     Comments: chills  HENT:     Head: Normocephalic.  Cardiovascular:     Rate and Rhythm: Regular rhythm. Tachycardia present.  Pulmonary:     Breath sounds: No wheezing.     Comments: Poor effort  On 2L/min [place]  Abdominal:     General: There is no distension.     Tenderness: There is no abdominal tenderness.  Musculoskeletal:     Comments: RUE PICC intact  Neurological:     Mental Status: She is alert. She is disoriented.     Data Reviewed: I have personally reviewed following labs and imaging studies  CBC: Recent Labs  Lab 07/11/19 0400 07/12/19 0500 07/13/19 0359 07/14/19 0430 07/15/19 0430  WBC 4.8  6.6 5.7 5.2 5.4  NEUTROABS  --   --   --   --  4.1  HGB 8.4* 9.0* 8.5* 8.1* 8.7*  HCT 25.9* 28.3* 26.2* 25.9* 26.8*  MCV 79.9* 79.9* 79.6* 80.9 80.0  PLT 340 377 345 315 [id number]   Basic Metabolic Panel: Recent Labs  Lab 07/11/19 0400 07/12/19 0500 07/13/19 0359 07/14/19 0430 07/15/19 0430  NA 133* 129* 131* 131* 132*  K 4.2 4.2 4.0 4.1 4.2  CL 104 101 105 106 106  CO2 18* 19* 20* 20* 18*  GLUCOSE 110* 134* 118* 118* 145*  BUN 24* 20 19 22 22   CREATININE 0.67 0.71 0.62 0.45 0.66  CALCIUM 8.6* 8.7* 8.6* 8.6* 8.7*  MG 1.9 1.8 1.7 1.9 1.8  PHOS 3.3 3.0 3.3 3.2 3.0   GFR: Estimated Creatinine Clearance: 57.6 mL/min (by C-G formula based on SCr of 0.66 mg/dL). Liver Function Tests: Recent Labs  Lab 07/11/19 0400 07/12/19 0758 07/15/19 0430  AST 108* 70* 60*  ALT 96* 85* 70*  ALKPHOS 294* 304* 305*  BILITOT 0.6 0.6 0.6  PROT 6.4* 6.8 6.6  ALBUMIN 2.2* 2.3* 2.1*   No results for input(s): LIPASE, AMYLASE in the last 168 hours. Recent Labs  Lab  07/13/19 1600  AMMONIA 16   Coagulation Profile: No results for input(s): INR, PROTIME in the last 168 hours. Cardiac Enzymes: No results for input(s): CKTOTAL, CKMB, CKMBINDEX, TROPONINI in the last 168 hours. BNP (last 3 results) No results for input(s): PROBNP in the last 8760 hours. HbA1C: No results for input(s): HGBA1C in the last 72 hours. CBG: Recent Labs  Lab 07/14/19 1957 07/14/19 2041 07/15/19 0010 07/15/19 0412 07/15/19 0749  GLUCAP 163* 145* 120* 126* 124*   Lipid Profile: Recent Labs    07/15/19 0430  TRIG 77   Thyroid Function Tests: No results for input(s): TSH, T4TOTAL, FREET4, T3FREE, THYROIDAB in the last 72 hours. Anemia Panel: No results for input(s): VITAMINB12, FOLATE, FERRITIN, TIBC, IRON, RETICCTPCT in the last 72 hours. Sepsis Labs: No results for input(s): PROCALCITON, LATICACIDVEN in the last 168 hours.  Recent Results (from the past 240 hour(s))  MRSA PCR Screening     Status: None   Collection Time: 07/05/19  3:59 PM   Specimen: Nasal Mucosa; Nasopharyngeal  Result Value Ref Range Status   MRSA by PCR NEGATIVE NEGATIVE Final    Comment:        The GeneXpert MRSA Assay (FDA approved for NASAL specimens only), is one component of a comprehensive MRSA colonization surveillance program. It is not intended to diagnose MRSA infection nor to guide or monitor treatment for MRSA infections. Performed at [facility], [place] [address], [place], [place] [address]   Culture, blood (routine x 2)     Status: None   Collection Time: 07/05/19  5:34 PM   Specimen: BLOOD LEFT FOREARM  Result Value Ref Range Status   Specimen Description   Final    BLOOD LEFT FOREARM Performed at [facility] [address] [place], [place] [address]    Special Requests   Final    BOTTLES DRAWN AEROBIC ONLY Blood Culture adequate volume Performed at [facility] [address] [place], [place]  [address]    Culture   Final    NO GROWTH 5 DAYS Performed at [facility], [place] [address], [place], [place] [address]    Report Status 07/10/2019 FINAL  Final  Culture, blood (routine x 2)  Status: None   Collection Time: 07/05/19  5:34 PM   Specimen: BLOOD  Result Value Ref Range Status   Specimen Description   Final    BLOOD LEFT ANTECUBITAL Performed at [facility] [address], [place], [place] [address]    Special Requests   Final    BOTTLES DRAWN AEROBIC ONLY Blood Culture adequate volume Performed at [facility] [address] [place], [place] [address]    Culture   Final    NO GROWTH 5 DAYS Performed at [facility], [place] [address], [place], [place] [address]    Report Status 07/10/2019 FINAL  Final  Culture, blood (routine x 2)     Status: None   Collection Time: 07/07/19  6:41 PM   Specimen: BLOOD  Result Value Ref Range Status   Specimen Description   Final    BLOOD LEFT ARM Performed at [facility] [address], [place], [place] [address]    Special Requests   Final    BOTTLES DRAWN AEROBIC ONLY Blood Culture adequate volume Performed at [facility] [address], [place], [place] [address]    Culture   Final    NO GROWTH 5 DAYS Performed at [facility], [place] [address], [place], [place] [address]    Report Status 07/12/2019 FINAL  Final  Culture, blood (routine x 2)     Status: None   Collection Time: 07/07/19  6:41 PM   Specimen: BLOOD  Result Value Ref Range Status   Specimen Description   Final    BLOOD BLOOD LEFT FOREARM Performed at [facility] [address], [place], [place] [address]    Special Requests   Final    BOTTLES DRAWN AEROBIC ONLY Blood Culture adequate volume Performed at [facility] [address], [place], [place] [address]    Culture   Final    NO GROWTH 5 DAYS Performed at [facility], [place] [address], [place], [place] [address]    Report Status 07/12/2019 FINAL  Final         Radiology Studies: No results found.      Scheduled Meds: . amLODipine  10 mg Oral Daily  . aspirin EC  81 mg Oral Daily  . bisacodyl  10 mg Rectal Daily  . carvedilol  6.25 mg Oral BID WC  . chlorhexidine  15 mL Mouth Rinse BID  . Chlorhexidine Gluconate Cloth  6 each Topical Daily  . enoxaparin (LOVENOX) injection  40 mg Subcutaneous Q24H  . feeding supplement  1 Container Oral TID BM  . insulin aspart  0-15 Units Subcutaneous Q4H  . lip balm  1 application Topical BID  . mouth rinse  15 mL Mouth Rinse q12n4p  . metoCLOPramide (REGLAN) injection  5 mg Intravenous Q8H  . pantoprazole  40 mg Oral Q1200  . polyethylene glycol  17 g Oral Daily  . polyvinyl alcohol  1 drop Both Eyes TID  . sodium chloride flush  10-40 mL Intracatheter Q12H  . sodium chloride flush  3 mL Intravenous Q12H   Continuous Infusions: . piperacillin-tazobactam (ZOSYN)  IV    . TPN ADULT (ION) 85 mL/hr at 07/14/19 1707     Time spent: 35 minutes with over 50% of the time coordinating the patient's care    Harold Hedge, DO [facility] Pager [phone]  Call night coverage person covering after 7pm

## 2019-07-15 NOTE — Progress Notes (Signed)
PHARMACY - TOTAL PARENTERAL NUTRITION CONSULT NOTE   Indication: bowel obstruction  Patient Measurements: Height: 5' 9" (175.3 cm) Weight: 148 lb 9.4 oz (67.4 kg) IBW/kg (Calculated) : 66.2 TPN AdjBW (KG): 69 Body mass index is 21.94 kg/m.  Assessment: 84 yo woman with hx of pelvic carcinoid tumor, admitted with N/V, decreased PO intake was found to have SBO and gastric distention.  Glucose / Insulin: hx of type II diabetes, controlled with diet. CBGs all pretty good - range 120-163, 15 units insulin in TPN, 7 units SSI given in past 24hrs,  Electrolytes: Na 132 slightly low but stable, CO2 dropped from 20 to 18, other lytes WNL stable Renal: WNL & stable. LFTs / TGs: AST/ALT 60/70 - trending down , Alk phos 304 stable, TBili WNL, TGs 77 (3/8) Prealbumin / albumin: 16.9 > 12.1 > 7.6 > 7.8 (3/8)  Intake / Output; MIVF: +812 mL I/O net, no MIVF currently   GI Imaging: 2/27 Abdominal CT: findings c/w SBO 3/1 KUB: ileus vs obstruction, interval improvement in small bowel dilatation Surgeries / Procedures: 2/22 exploratory laparotomy (n/a) lysis of adhesions Central access: PICC 2/22, pt pulled PICC out on 2/23 ~ 2100,  re-insert PICC 2/26 and restart TPN TPN start date: 2/22 -2/23, 2/26 >>   Nutritional Goals RD recs (3/2): KCal: 2000-2200 , Protein: 100-115 gm , Fluid: >/= 2 L/day  Goal TPN rate at 85 mL/hr (provides 102 g of protein and 2039 kcals per day) Protein: 50 g/L Lipids 29g/L Dextrose 15%  Current Nutrition:  CLD ordered > advance diet as tolerated per CCS note;  Boost ordered TID (providing 27 g protein), but pt with minimal PO tolerance. TPN at goal rate  Plan:   Continue TPN to goal rate 85 ml/hr, target rate   Electrolytes in TPN:   75 mEq/L of Na, 60mEq/L of K, 5mEq/L of Ca, 6mEq/L of Mg, and 23 mmol/L of Phos. Cl:Ac - change to max Acetate  Add trace elements to TPN daily and standard MVI on MWF only due to national backorder  Continue 15 units insulin in  TPN   Continue SSI to q4h,  adjust as needed   IVF per MD, none at the current time  Monitor TPN labs on Mon/Thurs  F/u diet tolerance- per CCS note: advance diet as tolerated  F/u ability to wean TPN off    M , PharmD, BCPS 07/15/2019 10:31 AM   

## 2019-07-15 NOTE — Progress Notes (Signed)
Pharmacy Antibiotic Note  Gloria Murphy is a 84 y.o. female admitted on 06/26/2019 with asp PNA.  Pharmacy has been consulted for Zosyn dosing.  Plan: Zosyn 3.375g IV q8 (extended interval infusion) per current renal function   Height: 5\' 9"  (175.3 cm) Weight: 148 lb 9.4 oz (67.4 kg) IBW/kg (Calculated) : 66.2  Temp (24hrs), Avg:99.4 F (37.4 C), Min:98.5 F (36.9 C), Max:100.3 F (37.9 C)  Recent Labs  Lab 07/11/19 0400 07/12/19 0500 07/13/19 0359 07/14/19 0430 07/15/19 0430  WBC 4.8 6.6 5.7 5.2 5.4  CREATININE 0.67 0.71 0.62 0.45 0.66    Estimated Creatinine Clearance: 57.6 mL/min (by C-G formula based on SCr of 0.66 mg/dL).    No Known Allergies   Thank you for allowing pharmacy to be a part of this patient's care.  Kara Mead 07/15/2019 8:23 AM

## 2019-07-15 NOTE — Significant Event (Signed)
Rapid Response Event Note  Overview: Time Called: 0522 Arrival Time: 0535 Event Type: MEWS  Initial Focused Assessment: Rapid response nurse called to bedside due to Patient restless and agitated  And with a MEWS score of 3 due to pulse, RR, and with low grade temp. Patient is unable to utilize IS due to confusion. Patient also noted to have a non-productive cough.  NP [place] updated at approximately [phone]. Palliative care to have goals of care meeting with family today.  Interventions: Continue to monitor Palliative care meeting to establish goals of care.   Plan of Care (if not transferred): Continue to monitor patient for increased confusion. Pallative care meeting in AM.   Patient stable and remained in room.   [facility] Coy Rochford MSN, RN-BC  [place]

## 2019-07-15 NOTE — TOC Progression Note (Signed)
Transition of Care [facility]) - Progression Note    Patient Details  Name: RAMON RIDLING MRN: [id number] Date of Birth: 1936-02-15  [facility] of [facility]) CM/SW Contact  Purcell Mouton, RN Phone Number: 07/15/2019, 1:10 PM  Clinical Narrative:     Spoke with pt's daughters at bedside concerning SNF's and residential hospice. List was given to them for residential hospice. Daughter's state that they have SNF list at home.   Expected Discharge Plan: Skilled Nursing Facility Barriers to Discharge: Continued Medical Work up  Expected Discharge Plan and Services Expected Discharge Plan: [place]   Discharge Planning Services: CM Consult   Living arrangements for the past 2 months: Single Family Home                 DME Arranged: 3-N-1, [place] rolling DME Agency: [employer] Date DME Agency Contacted: 07/03/19 Time DME Agency Contacted: [phone] Representative spoke with at DME Agency: Thedore Mins HH Arranged: PT, OT           Social Determinants of Health ([place]) Interventions    Readmission Risk Interventions No flowsheet data found.

## 2019-07-15 NOTE — Progress Notes (Signed)
Temp 102.3. Administer per rectum 650 mg tylenol. Paged Sharlet [place]. Will continue to monitor.

## 2019-07-15 NOTE — Progress Notes (Signed)
Patient remains very restless and agitated on shift and now has a MEWS score of 3 due to pulse and RR. She also had a low grade temp of 99.5 at 0415. MEWS protocol implemented. Unable to utilize IS due to confusion. Patient also noted to have a non-productive cough.  NP [place] updated at approximately [phone]. Palliative care to have goals of care meeting with family today at 11:30 am. Will continue to monitor patient for further changes.

## 2019-07-16 DIAGNOSIS — L8990 Pressure ulcer of unspecified site, unspecified stage: Secondary | ICD-10-CM | POA: Insufficient documentation

## 2019-07-16 LAB — COMPREHENSIVE METABOLIC PANEL
ALT: 62 U/L — ABNORMAL HIGH (ref 0–44)
AST: 59 U/L — ABNORMAL HIGH (ref 15–41)
Albumin: 1.9 g/dL — ABNORMAL LOW (ref 3.5–5.0)
Alkaline Phosphatase: 301 U/L — ABNORMAL HIGH (ref 38–126)
Anion gap: 7 (ref 5–15)
BUN: 20 mg/dL (ref 8–23)
CO2: 22 mmol/L (ref 22–32)
Calcium: 8.6 mg/dL — ABNORMAL LOW (ref 8.9–10.3)
Chloride: 104 mmol/L (ref 98–111)
Creatinine, Ser: 0.6 mg/dL (ref 0.44–1.00)
GFR calc Af Amer: >60 mL/min (ref >60)
GFR calc non Af Amer: >60 mL/min (ref >60)
Glucose, Bld: 160 mg/dL — ABNORMAL HIGH (ref 70–99)
Potassium: 4 mmol/L (ref 3.5–5.1)
Sodium: 133 mmol/L — ABNORMAL LOW (ref 135–145)
Total Bilirubin: 0.6 mg/dL (ref 0.3–1.2)
Total Protein: 6.6 g/dL (ref 6.5–8.1)

## 2019-07-16 LAB — GLUCOSE, CAPILLARY
Glucose-Capillary: 151 mg/dL — ABNORMAL HIGH (ref 70–99)
Glucose-Capillary: 159 mg/dL — ABNORMAL HIGH (ref 70–99)
Glucose-Capillary: 125 mg/dL — ABNORMAL HIGH (ref 70–99)
Glucose-Capillary: 172 mg/dL — ABNORMAL HIGH (ref 70–99)
Glucose-Capillary: 220 mg/dL — ABNORMAL HIGH (ref 70–99)
Glucose-Capillary: 125 mg/dL — ABNORMAL HIGH (ref 70–99)

## 2019-07-16 LAB — CBC
HCT: 24.6 % — ABNORMAL LOW (ref 36.0–46.0)
Hemoglobin: 7.9 g/dL — ABNORMAL LOW (ref 12.0–15.0)
MCH: 25.2 pg — ABNORMAL LOW (ref 26.0–34.0)
MCHC: 32.1 g/dL (ref 30.0–36.0)
MCV: 78.3 fL — ABNORMAL LOW (ref 80.0–100.0)
Platelets: 324 10*3/uL (ref 150–400)
RBC: 3.14 MIL/uL — ABNORMAL LOW (ref 3.87–5.11)
RDW: 16.7 % — ABNORMAL HIGH (ref 11.5–15.5)
WBC: 4.5 10*3/uL (ref 4.0–10.5)
nRBC: 0 % (ref 0.0–0.2)

## 2019-07-16 MED ORDER — TRAVASOL 10 % IV SOLN
INTRAVENOUS | Status: AC
  Filled 2019-07-16: qty 1020

## 2019-07-16 NOTE — Progress Notes (Signed)
Pt had small BM after Doculax suppository given.Sitting in chair. SRP, RN

## 2019-07-16 NOTE — Progress Notes (Signed)
NT reported pt MEWS RED, rechecked NT, pt MEWS Yellow, no changes form previous. Pt resp is 28, pulse 104. Pt denies pain  BP stable. Will cont to monitor. SRP, RN

## 2019-07-16 NOTE — Progress Notes (Signed)
Pt has MEWS Yellow throughtout the shift. Not acute changes, elevated HR and Resp. Denies pain and discomfort.

## 2019-07-16 NOTE — Progress Notes (Signed)
Daily Progress Note   Patient Name: Gloria Murphy       Date: 07/16/2019 DOB: [birth year]  Age: 84 y.o. MRN#: [id number] Attending Physician: Harold Hedge, MD Primary Care Physician: Carlyle Basques, MD Admit Date: 06/26/2019  Reason for Consultation/Follow-up: Establishing goals of care  Subjective: Patient continues to have poor po intake. Less than 25% per meal. Receiving TPN. Continued concerns for recurrent aspiration. Tachycardic. Spiked 102.3 temp overnight.   Received call from daughters Gloria Murphy, [place], and [place]) requesting support. Gloria Murphy reports family having difficult time making decision regarding next steps (G-tube versus a more comfort approach). Family endorse recent updates from attending today and now feeling more hopeful that patient may be showing some signs of improvement.   Discussed in detail with family what their goals are for patient and what they are most hopeful for. Family shares they are hopeful for improvement and patient would be able to return back home with family at some point. Family is concerned about Gloria Murphy poor nutrition and generalized weakness. They referenced their face to face meeting and visit with patient yesterday and the upset that patient did not seem to engage or recognize who they were. Therapeutic listening and support provided.   Family requested to review options again as they plan to meet today at 4pm as a family and make some final decisions as requested by medical team. I discussed at length with family options of G-tube vs no G-tube, rehab vs a more comfort approach. I shared my concerns with patient's poor prognosis and inability to successfully participate in therapy. We also re-visited G-tube placement in patient with co-morbidities  and underlying dementia. Family verbalized understanding and again expressing the need to make such a difficult decision.   Support offered and I again encouraged family as they come together to make a decision to keep Gloria Murphy, her wishes, and quality of life at the center of their discussions. They verbalized understanding and appreciation.   Family shared they are now leaning somewhat towards G-tube given updates earlier and the feeling of some hope that patient could potentially show signs of improvement and/or stability. Husband reports he is open to all options however relying on daughters to make the best decision.   Length of Stay: 20  Current Medications: Scheduled Meds:  . amLODipine  10  mg Oral Daily  . aspirin EC  81 mg Oral Daily  . bisacodyl  10 mg Rectal Daily  . carvedilol  6.25 mg Oral BID WC  . chlorhexidine  15 mL Mouth Rinse BID  . Chlorhexidine Gluconate Cloth  6 each Topical Daily  . enoxaparin (LOVENOX) injection  40 mg Subcutaneous Q24H  . feeding supplement  1 Container Oral TID BM  . insulin aspart  0-15 Units Subcutaneous Q4H  . lip balm  1 application Topical BID  . mouth rinse  15 mL Mouth Rinse q12n4p  . metoCLOPramide (REGLAN) injection  5 mg Intravenous Q8H  . pantoprazole  40 mg Oral Q1200  . polyethylene glycol  17 g Oral Daily  . polyvinyl alcohol  1 drop Both Eyes TID  . sodium chloride flush  10-40 mL Intracatheter Q12H  . sodium chloride flush  3 mL Intravenous Q12H    Continuous Infusions: . piperacillin-tazobactam (ZOSYN)  IV 3.375 g (07/16/19 1039)  . TPN ADULT (ION) 85 mL/hr at 07/16/19 0411  . TPN ADULT (ION)      PRN Meds: acetaminophen, alum & mag hydroxide-simeth, labetalol, magic mouthwash, metoprolol tartrate, ondansetron (ZOFRAN) IV, phenol, Resource ThickenUp Clear, sodium chloride flush, sodium chloride flush  Physical Exam     -awake, opens eyes, no verbal response, chronically-ill appearing -tachycardic -diminished  bilaterally -will not follow commands   Vital Signs: BP 126/83 (BP Location: Left Arm)   Pulse (!) 104   Temp 98.9 F (37.2 C) (Oral)   Resp (!) 30   Ht 5\' 9"  (1.753 m)   Wt 69.2 kg   SpO2 98%   BMI 22.52 kg/m  SpO2: SpO2: 98 % O2 Device: O2 Device: Room Air O2 Flow Rate: O2 Flow Rate (L/min): 2 L/min  Intake/output summary:   Intake/Output Summary (Last 24 hours) at 07/16/2019 1255 Last data filed at 07/16/2019 1010 Gross per 24 hour  Intake 3117.29 ml  Output 1500 ml  Net 1617.29 ml   LBM: Last BM Date: 07/14/19 Baseline Weight: Weight: 66.1 kg Most recent weight: Weight: 69.2 kg       Palliative Assessment/Data: TPN       Patient Active Problem List   Diagnosis Date Noted  . Pressure injury of skin 07/16/2019  . Dysphagia, oropharyngeal 07/13/2019  . FTT (failure to thrive) in adult   . Severe protein-calorie malnutrition ([place])   . Palliative care encounter   . Sinus tachycardia 06/27/2019  . Type 2 diabetes mellitus without complication, without long-term current use of insulin ([place]) 06/26/2019  . SBO (small bowel obstruction) s/p adhesiolysis 07/01/2019 06/26/2019  . AKI (acute kidney injury) ([place]) 06/26/2019  . Nausea and vomiting 06/26/2019  . Abdominal pain 06/26/2019  . Lactic acidosis 06/26/2019  . Elevated troponin 06/26/2019  . Essential hypertension 02/22/2019  . Exertional dyspnea 02/04/2019  . Vision disturbance 12/19/2018  . TIA (transient ischemic attack) 12/19/2018  . Malignant carcinoid tumor to left adnexa 10/06/2017  . Elevated CA-125 08/31/2017    Palliative Care Assessment & Plan   Recommendations/Plan:  DNR/DNI  Continue with current plan of care per medical team  Family remains hopeful for improvement/stability. Daughter Gloria Murphy is main contact as designated by family. Family planning to meet and make final decisions today at 4pm (G-tube or more comfort approach). They are clear family can not care for patient in the home and she  will require some support despite decisions.   G-tube placement in this patient would not provide any meaningful recovery in  the setting of possible underlying dementia and other co-morbidities. Placement could possibly pro-long life however will not increase quality. Prognosis remains poor.   PMT will continue to support and follow.   Goals of Care and Additional Recommendations:  Limitations on Scope of Treatment: Full Scope Treatment  Code Status:    Code Status Orders  (From admission, onward)         Start     Ordered   07/05/19 0754  Do not attempt resuscitation (DNR)  Continuous    Question Answer Comment  In the event of cardiac or respiratory ARREST Do not call a "code blue"   In the event of cardiac or respiratory ARREST Do not perform Intubation, CPR, defibrillation or ACLS   In the event of cardiac or respiratory ARREST Use medication by any route, position, wound care, and other measures to relive pain and suffering. May use oxygen, suction and manual treatment of airway obstruction as needed for comfort.      07/05/19 0754        Code Status History    Date Active Date Inactive Code Status Order ID Comments User Context   06/26/2019 1957 07/05/2019 0754 Full Code [id number]  Rhetta Mura DO ED   08/31/2017 1650 09/05/2017 1751 Full Code [id number]  Lahoma Crocker, MD Inpatient   Advance Care Planning Activity       Prognosis:   Guarded to Poor   Discharge Planning:  To Be Determined  Care plan was discussed with patient's family (daughters and husband).   Thank you for allowing the Palliative Medicine Team to assist in the care of this patient.  Time Total: 50 min.   Visit consisted of counseling and education dealing with the complex and emotionally intense issues of symptom management and palliative care in the setting of serious and potentially life-threatening illness.Greater than 50%  of this time was spent counseling and coordinating care  related to the above assessment and plan.  Alda Lea, AGPCNP-BC  Palliative Medicine Team [phone]   Please contact Palliative Medicine Team phone at [phone] for questions and concerns.

## 2019-07-16 NOTE — Progress Notes (Signed)
PHARMACY - TOTAL PARENTERAL NUTRITION CONSULT NOTE   Indication: bowel obstruction  Patient Measurements: Height: 5' 9" (175.3 cm) Weight: 152 lb 8 oz (69.2 kg) IBW/kg (Calculated) : 66.2 TPN AdjBW (KG): 69 Body mass index is 22.52 kg/m.  Assessment: 84 yo woman with hx of pelvic carcinoid tumor, admitted with N/V, decreased PO intake was found to have SBO and gastric distention.  Glucose / Insulin: hx of type II diabetes, controlled with diet. CBGs all pretty good - range 128-160, 15 units insulin in TPN, 7 units SSI given in past 24hrs,  Electrolytes: Na 133 slightly low but stable, CO2 improved to 22 after maximizing acetate in TPN, other lytes WNL stable Renal: WNL & stable. LFTs / TGs: AST/ALT 59/62 - trending down , Alk phos 301 stable, TBili WNL, TGs 77 (3/8) Prealbumin / albumin: 16.9 > 12.1 > 7.6 > 7.8 (3/8)  Intake / Output; MIVF: +812 mL I/O net, no MIVF currently   GI Imaging: 2/27 Abdominal CT: findings c/w SBO 3/1 KUB: ileus vs obstruction, interval improvement in small bowel dilatation Surgeries / Procedures: 2/22 exploratory laparotomy (n/a) lysis of adhesions Central access: PICC 2/22, pt pulled PICC out on 2/23 ~ 2100,  re-insert PICC 2/26 and restart TPN TPN start date: 2/22 -2/23, 2/26 >>   Nutritional Goals RD recs (3/2): KCal: 2000-2200 , Protein: 100-115 gm , Fluid: >/= 2 L/day  Goal TPN rate at 85 mL/hr (provides 102 g of protein and 2039 kcals per day) Protein: 50 g/L Lipids 29g/L Dextrose 15%  Current Nutrition:  CLD ordered > advance diet as tolerated per CCS note;  Boost ordered TID (providing 27 g protein), but pt with minimal PO tolerance. TPN at goal rate  Plan:   Continue TPN to goal rate 85 ml/hr, target rate   Electrolytes in TPN:   75 mEq/L of Na, 25mq/L of K, 564m/L of Ca, 38m75mL of Mg, and 23 mmol/L of Phos, max Acetate  Add trace elements to TPN daily and standard MVI on MWF only due to national backorder  Continue 15 units  insulin in TPN   Continue SSI to q4h,  adjust as needed   IVF per MD, none at the current time  Monitor TPN labs on Mon/Thurs  F/u diet tolerance- per CCS note: advance diet as tolerated  F/u ability to wean TPN off   JusAdrian SaranharmD, BCPS 07/16/2019 9:15 AM

## 2019-07-16 NOTE — Progress Notes (Addendum)
15 Days Post-Op  Subjective: Patient awake.  States she hasn't eaten.  Doesn't know if she is moving her bowels but I&O shows 2 BMS on 3/7.  Denies pain.  Resting comfortable.  Fever overnight of 102.9  ROS: unable due to mental acuity  Objective: Vital signs in last 24 hours: Temp:  [98.4 F (36.9 C)-102.3 F (39.1 C)] 98.5 F (36.9 C) (03/09 0333) Pulse Rate:  [84-106] 97 (03/09 0333) Resp:  [24-39] 30 (03/09 0333) BP: (92-139)/(64-82) 125/64 (03/09 0333) SpO2:  [98 %-100 %] 100 % (03/09 0333) Weight:  [69.2 kg] 69.2 kg (03/09 0500) Last BM Date: 07/14/19  Intake/Output from previous day: 03/08 0701 - 03/09 0700 In: 3178.3 [P.O.:61; I.V.:2983.8; IV Piggyback:133.5] Out: 1100 [Urine:1100] Intake/Output this shift: No intake/output data recorded.  PE: Heart: regular Lungs: CTAB Abd: soft, appropriately tender, midline incision is healing well with steristrips in place.  +BS, ND  Lab Results:  Recent Labs    07/15/19 0430 07/16/19 0415  WBC 5.4 4.5  HGB 8.7* 7.9*  HCT 26.8* 24.6*  PLT 342 324   BMET Recent Labs    07/15/19 0430 07/16/19 0415  NA 132* 133*  K 4.2 4.0  CL 106 104  CO2 18* 22  GLUCOSE 145* 160*  BUN 22 20  CREATININE 0.66 0.60  CALCIUM 8.7* 8.6*   PT/INR No results for input(s): LABPROT, INR in the last 72 hours. CMP     Component Value Date/Time   NA 133 (L) 07/16/2019 0415   NA 142 02/26/2019 1618   K 4.0 07/16/2019 0415   CL 104 07/16/2019 0415   CO2 22 07/16/2019 0415   GLUCOSE 160 (H) 07/16/2019 0415   BUN 20 07/16/2019 0415   BUN 19 02/26/2019 1618   CREATININE 0.60 07/16/2019 0415   CALCIUM 8.6 (L) 07/16/2019 0415   PROT 6.6 07/16/2019 0415   PROT 7.7 02/04/2019 1719   ALBUMIN 1.9 (L) 07/16/2019 0415   ALBUMIN 4.1 02/04/2019 1719   AST 59 (H) 07/16/2019 0415   ALT 62 (H) 07/16/2019 0415   ALKPHOS 301 (H) 07/16/2019 0415   BILITOT 0.6 07/16/2019 0415   BILITOT 0.4 02/04/2019 1719   GFRNONAA >60 07/16/2019 0415   GFRAA >60 07/16/2019 0415   Lipase     Component Value Date/Time   LIPASE 38 06/26/2019 1352       Studies/Results: DG Abd 1 View  Result Date: 07/15/2019 CLINICAL DATA:  History of small-bowel obstruction, postop EXAM: ABDOMEN - 1 VIEW COMPARISON:  None. FINDINGS: Dilated bowel appears to be be mostly colonic. This may reflect ileus. No organomegaly or free air. No suspicious calcification. IMPRESSION: Bowel dilatation appears predominantly colonic, favor ileus. Electronically Signed   By: Rolm Baptise M.D.   On: 07/15/2019 09:50   DG CHEST PORT 1 VIEW  Result Date: 07/15/2019 CLINICAL DATA:  History of carcinoid tumor admitted due to nausea vomiting and decreased p.o. intake. EXAM: PORTABLE CHEST 1 VIEW COMPARISON:  07/08/2019 FINDINGS: Cardiac silhouette is normal size.  No mediastinal or hilar masses. Mild interstitial thickening the lungs is similar to prior studies. No evidence of pneumonia or pulmonary edema. No pleural effusion or pneumothorax. Right-sided PICC has its tip in the mid superior vena cava. IMPRESSION: 1. No acute cardiopulmonary disease. Electronically Signed   By: Lajean Manes M.D.   On: 07/15/2019 09:53    Anti-infectives: Anti-infectives (From admission, onward)   Start     Dose/Rate Route Frequency Ordered Stop   07/15/19 1000  piperacillin-tazobactam (ZOSYN) IVPB 3.375 g     3.375 g 12.5 mL/hr over 240 Minutes Intravenous Every 8 hours 07/15/19 0825     07/05/19 1700  Ampicillin-Sulbactam (UNASYN) 3 g in sodium chloride 0.9 % 100 mL IVPB  Status:  Discontinued     3 g 200 mL/hr over 30 Minutes Intravenous Every 6 hours 07/05/19 1619 07/10/19 0715   07/01/19 1015  cefoTEtan (CEFOTAN) 2 g in sodium chloride 0.9 % 100 mL IVPB     2 g 200 mL/hr over 30 Minutes Intravenous On call to O.R. 07/01/19 0847 07/01/19 1234   06/27/19 1000  piperacillin-tazobactam (ZOSYN) IVPB 3.375 g  Status:  Discontinued     3.375 g 12.5 mL/hr over 240 Minutes Intravenous Every 8  hours 06/27/19 0807 06/29/19 1119   06/27/19 0200  piperacillin-tazobactam (ZOSYN) IVPB 2.25 g  Status:  Discontinued     2.25 g 100 mL/hr over 30 Minutes Intravenous Every 8 hours 06/26/19 2012 06/27/19 0807   06/26/19 1700  piperacillin-tazobactam (ZOSYN) IVPB 3.375 g     3.375 g 100 mL/hr over 30 Minutes Intravenous  Once 06/26/19 1651 06/26/19 1823       Assessment/Plan DM Lactic acidosis - resolved AKI -resolved Anemia  Protein calorie Malnutrition - prealbumin 7.8, down from 12 despite TNA H/o Carcinoid tumor s/p ex lapwith bilateral salpingo-oophorectomy, omentectomy and radical debulkingfor suspected ovarian cancer 08/31/2017 Dr. Denman George Dysphagia - on full liquids currently, but not taking in much at all Fever - CXR normal.  This is quite high for a typical intra-abdominal infection such as a post op abscess.  Would favor awaiting blood cultures and check a UA.  If all negative and fevers persists could order a CT A/P; however, her abdominal exam is relatively benign at this point.  Will follow  POD 15,S/pEXPLORATORY LAPAROTOMY (N/A)LYSIS OF ADHESIONS2/22 Dr. Shona Needles SBO - Mobilize, continue PT/OT -cont to work with SLP regarding diet as able -pulm toilet with IS -palliative discussions in place with family.  Given patient's dementia and overall poor intake, she has a relatively poor prognosis.  If decision is made by the family for a feeding tube, this would need to be done by IR as we would not be able to place a surgical feeding tube at this point.  ID -zosyn 2/17>>2/20, cefotetan periop x1 FEN -IVF,FLD, Boost, TPN VTE -SCDs,lovenox Foley -d/c 2/23 Follow up -Dr. Marlou Starks   LOS: 77 days    Henreitta Cea , [facility] Surgery 07/16/2019, 9:01 AM Please see Amion for pager number during day hours 7:00am-4:30pm or 7:00am -11:30am on weekends

## 2019-07-16 NOTE — Progress Notes (Signed)
Nutrition Follow-up  DOCUMENTATION CODES:   Not applicable  INTERVENTION:  D/c calorie count Continue to monitor for goals of care  If PEG is placed, recommend -Osmolite 1.2 @ 25 ml/hr, advance as tolerated 10 ml every 8 hrs to goal rate 65 ml/hr with 30 ml Pro-stat daily via tube This regimen at goal rate 65 ml/hr will provide 1972 kcal, 102 grams of protein, and 1279 ml free water  NUTRITION DIAGNOSIS:   Increased nutrient needs related to acute illness, post-op healing as evidenced by estimated needs. Ongoing   GOAL:   Patient will meet greater than or equal to 90% of their needs   Met with TPN  MONITOR:   Diet advancement, Labs, Weight trends, Skin  REASON FOR ASSESSMENT:   Consult Calorie Count  ASSESSMENT:  RD working remotely.  84 year old female admitted from home on 2/17 due to N/V and decreased p.o. intake. In the ED she was found to have a SBO and gastric distention. She was seen by surgery in the ED and NG tube was placed. She has a history of a pelvic carcinoid tumor for which she underwent ex lap in 08/2017 with bilateral salpingo-oophorectomy and omentectomy as well as partial hysterectomy.  Significant Events: 2/17- admission; NGT placed 2/18- initial RD assessment 2/19- NGT replaced and removed 2/20- NGT replaced x3 and removed x2 2/22- ex lap with LOAs; TPN initiation 2/23- diet advanced from NPO to CLD; NGT removed; patient confused and pulled PICC 2/24- diet advanced from CLD to [place] 2/25- double lumen PICC placed in R brachial; downgraded from FLD to CLD 2/26- downgraded from CLD to NPO; NGT placed  2/28- NGT removed; advanced from NPO to CLD and then back to NPO 3/2- advanced from NPO to CLD - abdominal staples removed  3/6- advanced from CLD to Dysphagia 1, nectar-thick; calorie count ordered 3/8 - diet downgraded to NPO due to recurrent aspiration PNA  Unable to complete 48 hr calorie count due to patient NPO at 5885 on 3/8. RD will  discontinue orders.   3/6 - 10% lunch (97 kcal, 3 grams protein) 3/7 - 10% breakfast, 20% lunch, 25% dinner (504 kcal, 20 grams of protein)  Per PC note, family with ongoing difficult decisions regarding patient goals of care (G-tube verses a more comfort approach) Family to meet at 4 pm today to make final decisions as requested by medical team. Family shared they are leaning somewhat towards G-tube given updates received and feeling some hope of potential improvement/stability. RD will provide tube feeding recommendations should family decide to move forward with PEG placement.   Currently, patient is receiving TPN at goal rate of 85 ml/hr. This regimen provides 2039 kcal and 102 grams of protein, meeting 100% of estimated needs.   Medications reviewed and include:Dulcolax, Miralax, Protonix, Coreg, SSI, Reglan  Labs: CBGs 125, 159, 151, 128 x 24 hrs, Na 133 (L)  Diet Order:   Diet Order            Diet NPO time specified  Diet effective now              EDUCATION NEEDS:   No education needs have been identified at this time  Skin:  Skin Assessment: Skin Integrity Issues: Skin Integrity Issues:: Incisions Incisions: abdomen 2/22  Last BM:  3/6  Height:   Ht Readings from Last 1 Encounters:  07/08/19 _0  (1.753 m)    Weight:   Wt Readings from Last 1 Encounters:  07/16/19 69.2 kg  Ideal Body Weight:  65.9 kg  BMI:  Body mass index is 22.52 kg/m.  Estimated Nutritional Needs:   Kcal:  2000-2200 kcal  Protein:  100-115 grams  Fluid:  >/= 2 L/day   Gloria Murphy, RD, LDN Clinical Nutrition After Hours/Weekend Pager # in [place]

## 2019-07-16 NOTE — Progress Notes (Signed)
SLP Cancellation Note  Patient Details Name: MILLENNIUM SIEKER MRN: [id number] DOB: [birth year]   Cancelled treatment:       Reason Eval/Treat Not Completed: Medical issues which prohibited therapy(pt with fever, to have a palliative meeting per notes, will follow)   Macario Golds 07/16/2019, 7:41 AM   Kathleen Lime, MS [place] Office [phone]

## 2019-07-16 NOTE — Progress Notes (Signed)
Occupational Therapy Treatment Patient Details Name: Gloria Murphy MRN: [id number] DOB: 08/06/1935 Today's Date: 07/16/2019    History of present illness 84 year old woman admitted from home on 2/17 due to nausea and vomiting with decreased p.o. intake. She has a history of a pelvic carcinoid tumor for which she underwent exploratory laparotomy in April 2019 with bilateral salpingo-oophorectomy and omentectomy as well as partial hysterectomy  In the ED she was found to have a large small bowel obstruction and gastric distention.  NG tube was placed, exploratory laparotomy 2/22.    OT comments  Patient demonstrated progress with performing toileting task using BSC. Intially patient required verbal cues of encouragement to sit EOB. Once sitting EOB to complete grooming task, pt requested to use toilet for bowel movement. Patient was impulsive with transfers and insisted that she could walk to bathroom with no assistance. Patient was educated on her decreased strength in LE and need for assistance from staff to stabilize. Patient required assist of 2 for stand pivot to [facility] and total assist with toileting due to not being able to maintain sitting or standing balance long enough to clean periareas.  Pt will benefit from continued skilled acute OT services.   Follow Up Recommendations  SNF;Supervision/Assistance - 24 hour    Equipment Recommendations  3 in 1 bedside commode    Recommendations for Other Services      Precautions / Restrictions Precautions Precautions: Fall Restrictions Weight Bearing Restrictions: No       Mobility Bed Mobility Overal bed mobility: Needs Assistance       Supine to sit: Mod assist Sit to supine: Mod assist      Transfers   Equipment used: 2 person hand held assist Transfers: Stand Pivot Transfers Sit to Stand: Mod assist Stand pivot transfers: Mod assist            Balance   Sitting-balance support: Feet supported;No upper extremity  supported Sitting balance-Leahy Scale: Fair       Standing balance-Leahy Scale: Poor                             ADL either performed or assessed with clinical judgement   ADL Overall ADL's : Needs assistance/impaired     Grooming: Wash/dry face;Set up;Supervision/safety;Standing                   Toilet Transfer: Moderate assistance;+2 for physical assistance;RW;Stand-pivot;BSC   Toileting- Clothing Manipulation and Hygiene: Total assistance       Functional mobility during ADLs: Moderate assistance;+2 for safety/equipment       Vision       Perception     Praxis      Cognition Arousal/Alertness: Awake/alert Behavior During Therapy: Anxious;Impulsive Overall Cognitive Status: Impaired/Different from baseline                             Awareness: Emergent Problem Solving: Slow processing;Decreased initiation;Difficulty sequencing;Requires verbal cues;Requires tactile cues General Comments: Impulsive with transfers        Exercises     Shoulder Instructions       General Comments      Pertinent Vitals/ Pain       Pain Assessment: No/denies pain  Home Living  Prior Functioning/Environment              Frequency           Progress Toward Goals  OT Goals(current goals can now be found in the care plan section)  Progress towards OT goals: Progressing toward goals     Plan Discharge plan needs to be updated    Co-evaluation                 AM-PAC OT "6 Clicks" Daily Activity     Outcome Measure   Help from another person eating meals?: A Little Help from another person taking care of personal grooming?: A Little Help from another person toileting, which includes using toliet, bedpan, or urinal?: Total Help from another person bathing (including washing, rinsing, drying)?: A Lot Help from another person to put on and taking off regular  upper body clothing?: A Lot Help from another person to put on and taking off regular lower body clothing?: Total 6 Click Score: 12    End of Session        Activity Tolerance Patient limited by fatigue   Patient Left in bed;with call bell/phone within reach;with bed alarm set   Nurse Communication          Time: [id number] OT Time Calculation (min): 42 min  Charges: OT General Charges $OT Visit: 1 Visit OT Treatments $Self Care/Home Management : 23-37 mins $Therapeutic Activity: 8-22 mins  Gloria Murphy    Gloria Murphy 07/16/2019, 4:48 PM

## 2019-07-16 NOTE — Plan of Care (Signed)
  Problem: Education: Goal: Knowledge of General Education information will improve Description: Including pain rating scale, medication(s)/side effects and non-pharmacologic comfort measures Outcome: Progressing   Problem: Education: Goal: Knowledge of General Education information will improve Description: Including pain rating scale, medication(s)/side effects and non-pharmacologic comfort measures Outcome: Progressing   Problem: Health Behavior/Discharge Planning: Goal: Ability to manage health-related needs will improve Outcome: Progressing   Problem: Clinical Measurements: Goal: Ability to maintain clinical measurements within normal limits will improve Outcome: Progressing Goal: Will remain free from infection Outcome: Progressing Goal: Diagnostic test results will improve Outcome: Progressing Goal: Cardiovascular complication will be avoided Outcome: Progressing

## 2019-07-16 NOTE — Progress Notes (Addendum)
Pt had small BM to today, however, continues to offer meal trays and liquids to pt. Pt take sips and refuses the rest. Husband at bedside and tries to encourage pt to eat. Will continue to monitor and offer pt food items pt maybe interested in eating. SRP, RN

## 2019-07-16 NOTE — Progress Notes (Addendum)
Pt continue to yellow MEWS, no acute changes noted. Pt noted without distress. MD aware from earlier notification. Bonna Gains, RN

## 2019-07-17 LAB — GLUCOSE, CAPILLARY
Glucose-Capillary: 116 mg/dL — ABNORMAL HIGH (ref 70–99)
Glucose-Capillary: 168 mg/dL — ABNORMAL HIGH (ref 70–99)
Glucose-Capillary: 171 mg/dL — ABNORMAL HIGH (ref 70–99)

## 2019-07-17 LAB — BASIC METABOLIC PANEL
Anion gap: 7 (ref 5–15)
BUN: 24 mg/dL — ABNORMAL HIGH (ref 8–23)
CO2: 23 mmol/L (ref 22–32)
Calcium: 8.6 mg/dL — ABNORMAL LOW (ref 8.9–10.3)
Chloride: 103 mmol/L (ref 98–111)
Creatinine, Ser: 0.63 mg/dL (ref 0.44–1.00)
GFR calc Af Amer: >60 mL/min (ref >60)
GFR calc non Af Amer: >60 mL/min (ref >60)
Glucose, Bld: 138 mg/dL — ABNORMAL HIGH (ref 70–99)
Potassium: 4 mmol/L (ref 3.5–5.1)
Sodium: 133 mmol/L — ABNORMAL LOW (ref 135–145)

## 2019-07-17 LAB — PHOSPHORUS: Phosphorus: 3.8 mg/dL (ref 2.5–4.6)

## 2019-07-17 LAB — MAGNESIUM: Magnesium: 1.8 mg/dL (ref 1.7–2.4)

## 2019-07-17 MED ORDER — INSULIN ASPART 100 UNIT/ML ~~LOC~~ SOLN
0.0000 [IU] | Freq: Four times a day (QID) | SUBCUTANEOUS | Status: DC
  Administered 2019-07-17 (×2): 3 [IU] via SUBCUTANEOUS
  Administered 2019-07-18: 2 [IU] via SUBCUTANEOUS
  Administered 2019-07-18 (×2): 3 [IU] via SUBCUTANEOUS
  Administered 2019-07-18 – 2019-07-20 (×5): 2 [IU] via SUBCUTANEOUS

## 2019-07-17 MED ORDER — TRAVASOL 10 % IV SOLN
INTRAVENOUS | Status: AC
  Filled 2019-07-17: qty 1020

## 2019-07-17 NOTE — Progress Notes (Signed)
Daily Progress Note   Patient Name: Gloria Murphy       Date: 07/17/2019 DOB: 1935-07-08  Age: 84 y.o. MRN#: [id number] Attending Physician: Flora Lipps, MD Primary Care Physician: Carlyle Basques, MD Admit Date: 06/26/2019  Reason for Consultation/Follow-up: Establishing goals of care  Subjective: I saw and examined Gloria Murphy today.  It is my first day meeting her, however, she was sitting in bedside chair with her husband present and was very interactive with [place] today.  She was noted to be active participant in evaluation by SLP today and at time of my encounter was requesting food and she states she is now hungry.  Earlier today, family had consented to PEG tube placement and I discussed with Rowe Robert from IR.  Based upon her improved participation, family would like to hold off on PEG tube placement for another 24 hours to see if she is in fact going to increase her intake and would also like to have modified barium to assess for dysphagia.  If it appears that she is not going to take in enough to maintain herself, family is still open to possibility of PEG tube placement, possibly on Friday.  Length of Stay: 21  Current Medications: Scheduled Meds:  . amLODipine  10 mg Oral Daily  . aspirin EC  81 mg Oral Daily  . bisacodyl  10 mg Rectal Daily  . carvedilol  6.25 mg Oral BID WC  . chlorhexidine  15 mL Mouth Rinse BID  . Chlorhexidine Gluconate Cloth  6 each Topical Daily  . enoxaparin (LOVENOX) injection  40 mg Subcutaneous Q24H  . feeding supplement  1 Container Oral TID BM  . insulin aspart  0-15 Units Subcutaneous Q6H  . lip balm  1 application Topical BID  . mouth rinse  15 mL Mouth Rinse q12n4p  . metoCLOPramide (REGLAN) injection  5 mg Intravenous Q8H  . pantoprazole   40 mg Oral Q1200  . polyethylene glycol  17 g Oral Daily  . polyvinyl alcohol  1 drop Both Eyes TID  . sodium chloride flush  10-40 mL Intracatheter Q12H  . sodium chloride flush  3 mL Intravenous Q12H    Continuous Infusions: . piperacillin-tazobactam (ZOSYN)  IV 3.375 g (07/17/19 0828)  . TPN ADULT (ION) 85 mL/hr at 07/17/19 0330  . TPN  ADULT (ION)      PRN Meds: acetaminophen, alum & mag hydroxide-simeth, labetalol, magic mouthwash, metoprolol tartrate, ondansetron (ZOFRAN) IV, phenol, Resource ThickenUp Clear, sodium chloride flush, sodium chloride flush  Physical Exam     -awake, opens eyes, no verbal response, chronically-ill appearing -tachycardic -diminished bilaterally -will not follow commands   Vital Signs: BP 104/69 (BP Location: Left Arm)   Pulse 80   Temp 98.5 F (36.9 C) (Axillary)   Resp (!) 30   Ht 5\' 9"  (1.753 m)   Wt 66.8 kg   SpO2 99%   BMI 21.75 kg/m  SpO2: SpO2: 99 % O2 Device: O2 Device: Room Air O2 Flow Rate: O2 Flow Rate (L/min): 2 L/min  Intake/output summary:   Intake/Output Summary (Last 24 hours) at 07/17/2019 1527 Last data filed at 07/17/2019 0930 Gross per 24 hour  Intake 779.58 ml  Output 950 ml  Net -170.42 ml   LBM: Last BM Date: 07/16/19 Baseline Weight: Weight: 66.1 kg Most recent weight: Weight: 66.8 kg       Palliative Assessment/Data: TPN       Patient Active Problem List   Diagnosis Date Noted  . Pressure injury of skin 07/16/2019  . Dysphagia, oropharyngeal 07/13/2019  . FTT (failure to thrive) in adult   . Severe protein-calorie malnutrition ([place])   . Palliative care encounter   . Sinus tachycardia 06/27/2019  . Type 2 diabetes mellitus without complication, without long-term current use of insulin ([place]) 06/26/2019  . SBO (small bowel obstruction) s/p adhesiolysis 07/01/2019 06/26/2019  . AKI (acute kidney injury) ([place]) 06/26/2019  . Nausea and vomiting 06/26/2019  . Abdominal pain 06/26/2019  . Lactic  acidosis 06/26/2019  . Elevated troponin 06/26/2019  . Essential hypertension 02/22/2019  . Exertional dyspnea 02/04/2019  . Vision disturbance 12/19/2018  . TIA (transient ischemic attack) 12/19/2018  . Malignant carcinoid tumor to left adnexa 10/06/2017  . Elevated CA-125 08/31/2017    Palliative Care Assessment & Plan   Recommendations/Plan:  DNR/DNI  Gloria Murphy is more interactive and was much more open to participating with speech therapy today.  During my encounter, she was asking for food as she stated she is now hungry.  Agree with recommendation per SLP for starting diet and performing MBS tomorrow to assess for dysphagia.  Her husband was at the bedside and I discussed with him as well as daughter, Gloria Murphy, via phone.  They are hopeful that with this improvement in her participation in her asking for food that she may be able to avoid PEG tube placement.  Both husband and daughter state they would like to hold off on Evaluation for PEG tube today.  While they have not ruled out PEG tube As option, they would now like to see how she does for another 24 hours prior to committing to PEG tube.  When asked about feeding tube today, the patient stated, " Ugh. I would never get home if they do that."  I also discussed with IR team who will be able to evaluate tomorrow to see how she is doing with p.o. intake with possible consideration of placement of PEG tube on Friday if it is still desired by family and she does not appear that she will be able to take enough by mouth to maintain herself.  Close monitor of intake, plan for MBS tomorrow, ? PEG on Friday based on course over next day  PMT will continue to support and follow.    Goals of Care and Additional  Recommendations:  Limitations on Scope of Treatment: Full Scope Treatment  Code Status:    Code Status Orders  (From admission, onward)         Start     Ordered   07/05/19 0754  Do not attempt resuscitation (DNR)  Continuous     Question Answer Comment  In the event of cardiac or respiratory ARREST Do not call a "code blue"   In the event of cardiac or respiratory ARREST Do not perform Intubation, CPR, defibrillation or ACLS   In the event of cardiac or respiratory ARREST Use medication by any route, position, wound care, and other measures to relive pain and suffering. May use oxygen, suction and manual treatment of airway obstruction as needed for comfort.      07/05/19 0754        Code Status History    Date Active Date Inactive Code Status Order ID Comments User Context   06/26/2019 1957 07/05/2019 0754 Full Code [id number]  Rhetta Mura DO ED   08/31/2017 1650 09/05/2017 1751 Full Code [id number]  Lahoma Crocker, MD Inpatient   Advance Care Planning Activity       Prognosis:   Guarded  Discharge Planning:  To Be Determined  Care plan was discussed with patient's family (daughter Gloria Murphy via phone and husband in person at the bedside).   Thank you for allowing the Palliative Medicine Team to assist in the care of this patient.  Time Total: 40 min.   Visit consisted of counseling and education dealing with the complex and emotionally intense issues of symptom management and palliative care in the setting of serious and potentially life-threatening illness.Greater than 50%  of this time was spent counseling and coordinating care related to the above assessment and plan.  Micheline Rough, MD [place] Team [phone]  Please contact Palliative Medicine Team phone at [phone] for questions and concerns.

## 2019-07-17 NOTE — Progress Notes (Signed)
Physical Therapy Treatment Patient Details Name: Gloria Murphy MRN: [id number] DOB: [birth year] Today's Date: 07/17/2019    History of Present Illness 84 year old woman admitted from home on 2/17 due to nausea and vomiting with decreased p.o. intake. She has a history of a pelvic carcinoid tumor for which she underwent exploratory laparotomy in April 2019 with bilateral salpingo-oophorectomy and omentectomy as well as partial hysterectomy  In the ED she was found to have a large small bowel obstruction and gastric distention.  NG tube was placed, exploratory laparotomy 2/22.     PT Comments    Patient making steady progress with skilled PT today. Pt required decreased assistance for bed mobility and transfers with RW. Pt continues to require cues for sequencing mobility and management of RW but demonstrated improved safety awareness today by checking for chair each time she needed to sit due to LE fatigue. Pt's HR remained in 100-110 bpm range throughout therapy session and she reported some fatigue at EOS. Acute PT will continue to progress as able.     Follow Up Recommendations  SNF     Equipment Recommendations  None recommended by PT    Recommendations for Other Services       Precautions / Restrictions Precautions Precautions: Fall Restrictions Weight Bearing Restrictions: No    Mobility  Bed Mobility Overal bed mobility: Needs Assistance Bed Mobility: Supine to Sit     Supine to sit: Min assist;HOB elevated     General bed mobility comments: verbal cues for sequencing UE reaching to bed rail and to bring LE's to EOB, min assist to fully raise trunk upright and to scoot to EOB. Cues for use of UE's to press up and scoot bottom closer to EOB inpreparation for stand.  Transfers Overall transfer level: Needs assistance Equipment used: 2 person hand held assist Transfers: Sit to/from [facility] Sit to Stand: Min assist Stand pivot transfers: Mod  assist       General transfer comment: verbal cues for safe hand placement/technique for power up. pt using Bil UE's to initiate power up and min assist required to complete and steady with rise. Mod assist required for RW managment with stand step/pivot transfer from [facility] to recliner. cues for posture during stand step transfer.  Ambulation/Gait Ambulation/Gait assistance: Min assist Gait Distance (Feet): 5 Feet Assistive device: Rolling walker (2 wheeled) Gait Pattern/deviations: Step-to pattern;Decreased stride length;Trunk flexed Gait velocity: decreased   General Gait Details: pt takign small steps forward and within RW to walk short distance to [facility]. Pt requried cues for posture to stand upright and assist to steady and manage RW during turn.    Stairs        Wheelchair Mobility    Modified Rankin (Stroke Patients Only)       Balance Overall balance assessment: Needs assistance Sitting-balance support: Feet supported Sitting balance-Leahy Scale: Fair Sitting balance - Comments: mostly min/guard, supervision static sitting, cues for safe hand placement on armrest of BSC   Standing balance support: Bilateral upper extremity supported Standing balance-Leahy Scale: Poor         Cognition Arousal/Alertness: Awake/alert Behavior During Therapy: WFL for tasks assessed/performed      Following Commands: Follows one step commands with increased time;Follows one step commands consistently;Follows multi-step commands inconsistently     Problem Solving: Slow processing;Decreased initiation;Difficulty sequencing;Requires verbal cues;Requires tactile cues General Comments: pt with decreased impulsivity today and asking/checking if chair or BSC is present before sitting down.  Exercises      General Comments        Pertinent Vitals/Pain Pain Assessment: No/denies pain           PT Goals (current goals can now be found in the care plan section) Acute Rehab PT  Goals PT Goal Formulation: With patient Time For Goal Achievement: 07/29/19 Potential to Achieve Goals: Fair Progress towards PT goals: Progressing toward goals    Frequency    Min 2X/week      PT Plan Current plan remains appropriate       AM-PAC PT "6 Clicks" Mobility   Outcome Measure  Help needed turning from your back to your side while in a flat bed without using bedrails?: A Little Help needed moving from lying on your back to sitting on the side of a flat bed without using bedrails?: A Little Help needed moving to and from a bed to a chair (including a wheelchair)?: A Little Help needed standing up from a chair using your arms (e.g., wheelchair or bedside chair)?: A Little Help needed to walk in hospital room?: A Lot Help needed climbing 3-5 steps with a railing? : Total 6 Click Score: 15    End of Session Equipment Utilized During Treatment: Gait belt Activity Tolerance: Patient limited by fatigue Patient left: in chair;with call bell/phone within reach;with chair alarm set;with family/visitor present Nurse Communication: Mobility status PT Visit Diagnosis: Other abnormalities of gait and mobility (R26.89)     Time: [id number] PT Time Calculation (min) (ACUTE ONLY): 20 min  Charges:  $Therapeutic Activity: 8-22 mins                     Verner Mould, DPT Physical Therapist with [facility] [phone]  07/17/2019 2:04 PM

## 2019-07-17 NOTE — Progress Notes (Addendum)
  Speech Language Pathology Treatment: Dysphagia  Patient Details Name: Gloria Murphy MRN: [id number] DOB: August 30, 1935 Today's Date: 07/17/2019 Time: [id number] SLP Time Calculation (min) (ACUTE ONLY): 25 min  Assessment / Plan / Recommendation Clinical Impression  Pt sitting in recliner visiting with her spouse.  She is talkative and animated today.  Pt consumed water, nectar-thick liquids, and applesauce with adequate oral attention, brisk swallow response, and intermittent delayed dry coughing after the swallow - regardless of consistency.  She required occasional verbal cues for self-feeding and bolus size. Difficult to discern if coughing is related to a dysphagia.  Given how improved her mentation is today, recommend resuming a PO diet (dysphagia 1, thin liquids). Pt would benefit from MBS next date to determine if a pharyngeal dysphagia is present.     HPI HPI: Pt is an 84 yo female adm to [facility] with SBO s/p OR 2/22.  Pt has had several NG tubes placed but not one currently.  Pt also with h/o pelvic carcinoid tumor that caused the SBO.  Swallow evaluation ordered due to concerns for dysphagia, DM.  She has been treated with ABX for potential aspiration pna.  Pt was started on clear liquids.  RN reports she is coughing with all intake - subtle cough and throat clearing.  Pt attributes this to secretion retention.      SLP Plan  Continue with current plan of care       Recommendations  Diet recommendations: Dysphagia 1 (puree);Thin liquid Liquids provided via: Cup;Straw Medication Administration: Whole meds with puree Supervision: Patient able to self feed Compensations: Slow rate;Small sips/bites Postural Changes and/or Swallow Maneuvers: Seated upright 90 degrees;Upright 30-60 min after meal                Oral Care Recommendations: Oral care BID Follow up Recommendations: Other (comment)(tba) SLP Visit Diagnosis: Dysphagia, unspecified (R13.10) Plan: Continue with current  plan of care       [place]. Tivis Ringer, [place] CCC/SLP Acute Rehabilitation Services Office number [phone] Pager [phone]  Gloria Murphy 07/17/2019, 2:34 PM

## 2019-07-17 NOTE — Progress Notes (Signed)
PHARMACY - TOTAL PARENTERAL NUTRITION CONSULT NOTE   Indication: bowel obstruction  Patient Measurements: Height: '5\' 9"'$  (175.3 cm) Weight: 147 lb 4.8 oz (66.8 kg) IBW/kg (Calculated) : 66.2 TPN AdjBW (KG): 69 Body mass index is 21.75 kg/m.  Assessment: 84 yo woman with hx of pelvic carcinoid tumor, admitted with N/V, decreased PO intake was found to have SBO and gastric distention.  Glucose / Insulin: Hx DM2, diet-controlled; CBGs mostly at goal (116-220) - 15 units insulin in TPN, 9 units SSI given in past 24hrs,  Electrolytes: Na 133 slightly low but stable, CO2 improved to 22 after maximizing acetate in TPN, other lytes WNL stable Renal: SCr WNL/stable; UOP adequate; BUN slightly elevated LFTs / TGs: AST/ALT mildly elevated but trending down, Alk phos stable ~300; TBili stable WNL, TGs stable WNL (3/8) Prealbumin: 16.9 > 12.1 > 7.6 > 7.8 (3/8)  Intake / Output; MIVF: +812 mL I/O net, no MIVF currently   GI Imaging: 2/27 Abdominal CT: findings c/w SBO 3/1 KUB: ileus vs obstruction, interval improvement in small bowel dilatation Surgeries / Procedures: 2/22 exploratory laparotomy (n/a) lysis of adhesions Central access: PICC 2/22, pt pulled PICC out on 2/23 ~ 2100,  re-insert PICC 2/26 and restart TPN TPN start date: 2/22-2/23, 2/26 >>   Nutritional Goals RD recs (3/2): KCal: 2000-2200 , Protein: 100-115 gm , Fluid: >/= 2 L/day  Goal TPN rate at 85 mL/hr (provides 102 g of protein and 2060 kcals per day) Protein: 50 g/L Lipids 30 g/L Dextrose 15% (GIR 3.18)  Current Nutrition:  NPO except meds TPN at goal rate  Plan:   Continue TPN to goal rate 85 ml/hr, target rate   Electrolytes in TPN:   90 mEq/L of Na, 55mq/L of K, 518m/L of Ca, 7 mEq/L of Mg, and 20 mmol/L of Phos, max Acetate  Add trace elements to TPN daily and standard MVI on MWF only due to national backorder  Continue 15 units insulin in TPN, don't anticipate anymore elevated levels while NPO   Adjust  SSI to q6h while NPO  IVF per MD, none at the current time  Monitor TPN labs on Mon/Thurs  F/u plans for PEG vs palliative care and ability to wean TPN  DrReuel BoomPharmD, BCPS [phone] 4061/02/2020, 8:57 AM

## 2019-07-17 NOTE — Progress Notes (Signed)
Patient remains yellow on MEWS due to HR and RR.  Not an acute change.

## 2019-07-17 NOTE — Progress Notes (Signed)
PROGRESS NOTE  Gloria Murphy [id number] DOB: Sep 29, 1935 DOA: 06/26/2019 PCP: Carlyle Basques, MD   LOS: 21 days   Brief narrative: As per HPI,  84 year old woman with history of carcinoid tumor with history of extensive surgery admitted from home on 2/17 due to nausea and vomiting with decreased p.o. intake.She was found to have bowel obstruction due to adhesions. Patient was taken to the OR on 07/01/2019 for lysis of adhesions. Again she ended up having recurrence of small bowel obstruction. She was started on clear liquids on 07/09/2019. Transfer to med telemetry unit 3/3. She has continued to have poor p.o. intake and had worsening encephalopathy throughout hospitalization.  G-tube is being considered.  Palliative care has been consulted to discuss goals of care and stated patient has poor prognosis.   Assessment/Plan:  Principal Problem:   SBO (small bowel obstruction) s/p adhesiolysis 07/01/2019 Active Problems:   Malignant carcinoid tumor to left adnexa   Essential hypertension   Type 2 diabetes mellitus without complication, without long-term current use of insulin (HCC)   AKI (acute kidney injury) ([place])   Nausea and vomiting   Abdominal pain   Lactic acidosis   Elevated troponin   Sinus tachycardia   Dysphagia, oropharyngeal   FTT (failure to thrive) in adult   Severe protein-calorie malnutrition ([place])   Palliative care encounter   Pressure injury of skin  SIRS / Acute hypoxic respiratory failure, concern for recurrent aspiration pneumonia. Completed Unasyn 2/26->3/3 for aspiration pneumonia.  High aspiration risk.  Rapid response with possible for aspiration so antibiotics have been restarted since 07/15/2019.  Currently on Zosyn.  Recurrent SBO s/p exploratory laparotomy with lysis of adhesions (07/01/2019), history of multiple abdominal surgeries / Failure to thrive  Patient has been off NG tube since at 2/28.  Currently getting TPN.  Spoke with surgery.  No  recommendation for n.p.o. but high aspiration risk.  I had a prolonged discussion with the family regarding further goals of care.  Palliative care on board as well.  Family is considering G-tube at this time.  Consult IR.  AKI secondary to intravascular volume depletion, Resolved with IV fluids  Acute metabolic encephalopathy, multifactorial etiology: hyponatremia, hospital-acquired delirium, Medication induced, SIRS, Hypoxia in setting of dementia.  Little better as per the family.  Continue delirium precaution.  Off methimazole morphine methocarbamol tramadol.  Essential hypertension On metoprolol and labetalol IV at this time.  Type 2 diabetes mellitus On TPN.  Continue sliding scale insulin Accu-Cheks.  Hyponatremia  mild.  Will closely monitor on TPN.  Hypokalemia, resolved.  We will continue to monitor closely.  Hyperthyroidism  off methimazole  Elevated LFTs, possibly from TPN.  Will monitor closely.  Mild elevation.  History of pelvic carcinoid tumor followed by Dr. Denman George.  Had extensive surgery in the past.  Failure to thrive, severe protein calorie malnutrition.  Currently on TPN.  Family wishing PEG tube placement.  VTE Prophylaxis: Lovenox  Code Status: DO NOT RESUSCITATE  Family Communication: I had a prolonged discussion with the patient's daughter and patient's husband on the phone.  Discussed about further care including nutrition.  Family wishes PEG tube placement despite knowing the risk versus benefit and the overall poor prognosis of the patient.  Disposition Plan:  . Patient is from home . Likely disposition to skilled facility likely in 2 to 3 days . Barriers to discharge: On TPN, poor oral intake, family wishing PEG tube placement.   Consultants:  General surgery  Palliative care  Procedures:  PICC line placement and TPN.  NG tube placement  Laboratory laparotomy with lysis of adhesions on 07/01/2019.  Staples removed 3  5  Antibiotics:  . Zosyn 07/15/2019>>  Anti-infectives (From admission, onward)   Start     Dose/Rate Route Frequency Ordered Stop   07/15/19 1000  piperacillin-tazobactam (ZOSYN) IVPB 3.375 g     3.375 g 12.5 mL/hr over 240 Minutes Intravenous Every 8 hours 07/15/19 0825     07/05/19 1700  Ampicillin-Sulbactam (UNASYN) 3 g in sodium chloride 0.9 % 100 mL IVPB  Status:  Discontinued     3 g 200 mL/hr over 30 Minutes Intravenous Every 6 hours 07/05/19 1619 07/10/19 0715   07/01/19 1015  cefoTEtan (CEFOTAN) 2 g in sodium chloride 0.9 % 100 mL IVPB     2 g 200 mL/hr over 30 Minutes Intravenous On call to O.R. 07/01/19 0847 07/01/19 1234   06/27/19 1000  piperacillin-tazobactam (ZOSYN) IVPB 3.375 g  Status:  Discontinued     3.375 g 12.5 mL/hr over 240 Minutes Intravenous Every 8 hours 06/27/19 0807 06/29/19 1119   06/27/19 0200  piperacillin-tazobactam (ZOSYN) IVPB 2.25 g  Status:  Discontinued     2.25 g 100 mL/hr over 30 Minutes Intravenous Every 8 hours 06/26/19 2012 06/27/19 0807   06/26/19 1700  piperacillin-tazobactam (ZOSYN) IVPB 3.375 g     3.375 g 100 mL/hr over 30 Minutes Intravenous  Once 06/26/19 1651 06/26/19 1823       Subjective: Today, patient was seen and examined at bedside.  Denies any nausea vomiting or abdominal pain.  Has had bowel movements.  Objective: Vitals:   07/17/19 0432 07/17/19 0433  BP: 117/63   Pulse: (!) 101   Resp:  (!) 30  Temp: 98.6 F (37 C)   SpO2: 100%     Intake/Output Summary (Last 24 hours) at 07/17/2019 1310 Last data filed at 07/17/2019 0449 Gross per 24 hour  Intake 1762.4 ml  Output 950 ml  Net 812.4 ml   Filed Weights   07/15/19 0427 07/16/19 0500 07/17/19 0445  Weight: 67.4 kg 69.2 kg 66.8 kg   Body mass index is 21.75 kg/m.   Physical Exam: GENERAL: Patient is alert awake and communicative.  Not in obvious distress. HENT: No scleral pallor or icterus. Pupils equally reactive to light. Oral mucosa is moist NECK: is  supple, no gross swelling noted. CHEST: Clear to auscultation. No crackles or wheezes.  Diminished breath sounds bilaterally. CVS: S1 and S2 heard, no murmur. Regular rate and rhythm.  ABDOMEN: Soft, non-tender, bowel sounds are present.  Status post removal of staples.  External urinary catheter. EXTREMITIES: No edema.  Right upper extremity PICC line in place. CNS: Cranial nerves are intact. No focal motor deficits. SKIN: warm and dry, surgical incision of the abdomen  Data Review: I have personally reviewed the following laboratory data and studies,  CBC: Recent Labs  Lab 07/12/19 0500 07/13/19 0359 07/14/19 0430 07/15/19 0430 07/16/19 0415  WBC 6.6 5.7 5.2 5.4 4.5  NEUTROABS  --   --   --  4.1  --   HGB 9.0* 8.5* 8.1* 8.7* 7.9*  HCT 28.3* 26.2* 25.9* 26.8* 24.6*  MCV 79.9* 79.6* 80.9 80.0 78.3*  PLT 377 345 315 342 0000000   Basic Metabolic Panel: Recent Labs  Lab 07/12/19 0500 07/12/19 0500 07/13/19 0359 07/14/19 0430 07/15/19 0430 07/16/19 0415 07/17/19 0340  NA 129*   < > 131* 131* 132* 133* 133*  K 4.2   < >  4.0 4.1 4.2 4.0 4.0  CL 101   < > 105 106 106 104 103  CO2 19*   < > 20* 20* 18* 22 23  GLUCOSE 134*   < > 118* 118* 145* 160* 138*  BUN 20   < > 19 22 22 20  24*  CREATININE 0.71   < > 0.62 0.45 0.66 0.60 0.63  CALCIUM 8.7*   < > 8.6* 8.6* 8.7* 8.6* 8.6*  MG 1.8  --  1.7 1.9 1.8  --  1.8  PHOS 3.0  --  3.3 3.2 3.0  --  3.8   < > = values in this interval not displayed.   Liver Function Tests: Recent Labs  Lab 07/11/19 0400 07/12/19 0758 07/15/19 0430 07/16/19 0415  AST 108* 70* 60* 59*  ALT 96* 85* 70* 62*  ALKPHOS 294* 304* 305* 301*  BILITOT 0.6 0.6 0.6 0.6  PROT 6.4* 6.8 6.6 6.6  ALBUMIN 2.2* 2.3* 2.1* 1.9*   No results for input(s): LIPASE, AMYLASE in the last 168 hours. Recent Labs  Lab 07/13/19 1600  AMMONIA 16   Cardiac Enzymes: No results for input(s): CKTOTAL, CKMB, CKMBINDEX, TROPONINI in the last 168 hours. BNP (last 3  results) No results for input(s): BNP in the last 8760 hours.  ProBNP (last 3 results) No results for input(s): PROBNP in the last 8760 hours.  CBG: Recent Labs  Lab 07/16/19 1746 07/16/19 2029 07/16/19 2320 07/17/19 0807 07/17/19 1209  GLUCAP 172* 220* 125* 116* 168*   Recent Results (from the past 240 hour(s))  Culture, blood (routine x 2)     Status: None   Collection Time: 07/07/19  6:41 PM   Specimen: BLOOD  Result Value Ref Range Status   Specimen Description   Final    BLOOD LEFT ARM Performed at [facility], [place] [address] [place], [place] [address]    Special Requests   Final    BOTTLES DRAWN AEROBIC ONLY Blood Culture adequate volume Performed at [facility] [address] [place], [place] [address]    Culture   Final    NO GROWTH 5 DAYS Performed at [facility], [place] [address], [place], [place] [address]    Report Status 07/12/2019 FINAL  Final  Culture, blood (routine x 2)     Status: None   Collection Time: 07/07/19  6:41 PM   Specimen: BLOOD  Result Value Ref Range Status   Specimen Description   Final    BLOOD BLOOD LEFT FOREARM Performed at [facility] [address] [place], [place] [address]    Special Requests   Final    BOTTLES DRAWN AEROBIC ONLY Blood Culture adequate volume Performed at [facility] [address] [place], [place] [address]    Culture   Final    NO GROWTH 5 DAYS Performed at [facility], [place] [address], [place], [place] [address]    Report Status 07/12/2019 FINAL  Final  Culture, blood (routine x 2)     Status: None (Preliminary result)   Collection Time: 07/15/19  9:08 AM   Specimen: BLOOD  Result Value Ref Range Status   Specimen Description   Final    BLOOD RIGHT ANTECUBITAL Performed at [facility] [address], [place],  [address]    Special Requests   Final    BOTTLES DRAWN AEROBIC AND  ANAEROBIC Blood Culture adequate volume Performed at [facility], [place] [address], [place], [place] [address]    Culture   Final    NO GROWTH 2 DAYS Performed at [facility] [address], [place], [place] [address]    Report Status PENDING  Incomplete  Culture, blood (routine x 2)     Status: None (Preliminary result)   Collection Time: 07/15/19  9:14 AM   Specimen: BLOOD RIGHT HAND  Result Value Ref Range Status   Specimen Description   Final    BLOOD RIGHT HAND Performed at [facility] [address] [place], [place] [address]    Special Requests   Final    BOTTLES DRAWN AEROBIC ONLY Blood Culture adequate volume Performed at [facility] [address], [place], [place] [address]    Culture   Final    NO GROWTH 2 DAYS Performed at [facility] [address], [place], [place] [address]    Report Status PENDING  Incomplete     Studies: No results found.    Flora Lipps, MD  [facility] 07/17/2019

## 2019-07-18 ENCOUNTER — Inpatient Hospital Stay (HOSPITAL_COMMUNITY): Payer: Medicare Other

## 2019-07-18 DIAGNOSIS — I10 Essential (primary) hypertension: Secondary | ICD-10-CM

## 2019-07-18 LAB — COMPREHENSIVE METABOLIC PANEL
ALT: 82 U/L — ABNORMAL HIGH (ref 0–44)
AST: 70 U/L — ABNORMAL HIGH (ref 15–41)
Albumin: 2.1 g/dL — ABNORMAL LOW (ref 3.5–5.0)
Alkaline Phosphatase: 346 U/L — ABNORMAL HIGH (ref 38–126)
Anion gap: 7 (ref 5–15)
BUN: 24 mg/dL — ABNORMAL HIGH (ref 8–23)
CO2: 23 mmol/L (ref 22–32)
Calcium: 8.5 mg/dL — ABNORMAL LOW (ref 8.9–10.3)
Chloride: 102 mmol/L (ref 98–111)
Creatinine, Ser: 0.64 mg/dL (ref 0.44–1.00)
GFR calc Af Amer: >60 mL/min (ref >60)
GFR calc non Af Amer: >60 mL/min (ref >60)
Glucose, Bld: 149 mg/dL — ABNORMAL HIGH (ref 70–99)
Potassium: 3.8 mmol/L (ref 3.5–5.1)
Sodium: 132 mmol/L — ABNORMAL LOW (ref 135–145)
Total Bilirubin: 0.5 mg/dL (ref 0.3–1.2)
Total Protein: 6.8 g/dL (ref 6.5–8.1)

## 2019-07-18 LAB — CBC
HCT: 24.6 % — ABNORMAL LOW (ref 36.0–46.0)
Hemoglobin: 7.9 g/dL — ABNORMAL LOW (ref 12.0–15.0)
MCH: 25.4 pg — ABNORMAL LOW (ref 26.0–34.0)
MCHC: 32.1 g/dL (ref 30.0–36.0)
MCV: 79.1 fL — ABNORMAL LOW (ref 80.0–100.0)
Platelets: 344 10*3/uL (ref 150–400)
RBC: 3.11 MIL/uL — ABNORMAL LOW (ref 3.87–5.11)
RDW: 16.3 % — ABNORMAL HIGH (ref 11.5–15.5)
WBC: 4.2 10*3/uL (ref 4.0–10.5)
nRBC: 0 % (ref 0.0–0.2)

## 2019-07-18 LAB — GLUCOSE, CAPILLARY
Glucose-Capillary: 140 mg/dL — ABNORMAL HIGH (ref 70–99)
Glucose-Capillary: 147 mg/dL — ABNORMAL HIGH (ref 70–99)
Glucose-Capillary: 152 mg/dL — ABNORMAL HIGH (ref 70–99)
Glucose-Capillary: 157 mg/dL — ABNORMAL HIGH (ref 70–99)

## 2019-07-18 LAB — PROTIME-INR
INR: 1.1 (ref 0.8–1.2)
Prothrombin Time: 14.4 seconds (ref 11.4–15.2)

## 2019-07-18 LAB — T4, FREE: Free T4: 2.28 ng/dL — ABNORMAL HIGH (ref 0.61–1.12)

## 2019-07-18 LAB — MAGNESIUM: Magnesium: 2.1 mg/dL (ref 1.7–2.4)

## 2019-07-18 LAB — PHOSPHORUS: Phosphorus: 3.1 mg/dL (ref 2.5–4.6)

## 2019-07-18 LAB — TSH: TSH: 0.031 u[IU]/mL — ABNORMAL LOW (ref 0.350–4.500)

## 2019-07-18 MED ORDER — TRAVASOL 10 % IV SOLN
INTRAVENOUS | Status: AC
  Filled 2019-07-18: qty 1020

## 2019-07-18 NOTE — Progress Notes (Signed)
Physical Therapy Treatment Patient Details Name: Gloria Murphy MRN: [id number] DOB: April 02, 1936 Today's Date: 07/18/2019    History of Present Illness 84 year old woman admitted from home on 2/17 due to nausea and vomiting with decreased p.o. intake. She has a history of a pelvic carcinoid tumor for which she underwent exploratory laparotomy in April 2019 with bilateral salpingo-oophorectomy and omentectomy as well as partial hysterectomy  In the ED she was found to have a large small bowel obstruction and gastric distention.  NG tube was placed, exploratory laparotomy 2/22.     PT Comments    Pt assisted with ambulate 5 feet in room!  Pt requiring +2 assist for weakness and safety.  Recliner brought behind pt as pt fatigued quickly.  Continue to recommend SNF upon d/c.    Follow Up Recommendations  SNF     Equipment Recommendations  None recommended by PT    Recommendations for Other Services       Precautions / Restrictions Precautions Precautions: Fall    Mobility  Bed Mobility Overal bed mobility: Needs Assistance Bed Mobility: Supine to Sit     Supine to sit: Min assist;HOB elevated     General bed mobility comments: verbal cues for sequencing UE reaching to bed rail and to bring LE's to EOB, min assist to fully raise trunk upright and to scoot to EOB  Transfers Overall transfer level: Needs assistance Equipment used: Rolling walker (2 wheeled) Transfers: Sit to/from [facility] Sit to Stand: Mod assist;+2 physical assistance         General transfer comment: verbal cues for safe hand placement. pt using Bil UE's to initiate power up and mod assist required to complete and steady with rise. assist to control descent  Ambulation/Gait Ambulation/Gait assistance: Min assist Gait Distance (Feet): 5 Feet Assistive device: Rolling walker (2 wheeled) Gait Pattern/deviations: Step-to pattern;Decreased stride length;Trunk flexed Gait velocity:  decreased   General Gait Details: verbal cues for RW positioning, posture, pt requiring assist for stability, only tolerated a few steps forward before reporting fatigue and requesting recliner   Stairs             Wheelchair Mobility    Modified Rankin (Stroke Patients Only)       Balance Overall balance assessment: Needs assistance Sitting-balance support: Feet supported Sitting balance-Leahy Scale: Fair     Standing balance support: Bilateral upper extremity supported Standing balance-Leahy Scale: Poor Standing balance comment: requires UE support and external assist                            Cognition Arousal/Alertness: Awake/alert Behavior During Therapy: WFL for tasks assessed/performed Overall Cognitive Status: No family/caregiver present to determine baseline cognitive functioning                         Following Commands: Follows one step commands with increased time;Follows one step commands consistently;Follows multi-step commands inconsistently     Problem Solving: Slow processing;Decreased initiation;Difficulty sequencing;Requires verbal cues;Requires tactile cues General Comments: pt assisting as able, required repeated cues however possibly due to [facility]      Exercises      General Comments        Pertinent Vitals/Pain Pain Assessment: No/denies pain Pain Intervention(s): Repositioned;Monitored during session    Home Living                      Prior Function  PT Goals (current goals can now be found in the care plan section) Progress towards PT goals: Progressing toward goals    Frequency    Min 2X/week      PT Plan Current plan remains appropriate    Co-evaluation              AM-PAC PT "6 Clicks" Mobility   Outcome Measure  Help needed turning from your back to your side while in a flat bed without using bedrails?: A Little Help needed moving from lying on your back to  sitting on the side of a flat bed without using bedrails?: A Little Help needed moving to and from a bed to a chair (including a wheelchair)?: A Little Help needed standing up from a chair using your arms (e.g., wheelchair or bedside chair)?: A Lot Help needed to walk in hospital room?: A Lot Help needed climbing 3-5 steps with a railing? : Total 6 Click Score: 14    End of Session Equipment Utilized During Treatment: Gait belt Activity Tolerance: Patient limited by fatigue Patient left: in chair;with call bell/phone within reach;with chair alarm set   PT Visit Diagnosis: Other abnormalities of gait and mobility (R26.89)     Time: [id number] PT Time Calculation (min) (ACUTE ONLY): 14 min  Charges:  $Gait Training: 8-22 mins                     Arlyce Dice, DPT Acute Rehabilitation Services Office: [place] [facility] 07/18/2019, 1:58 PM

## 2019-07-18 NOTE — Progress Notes (Signed)
Daily Progress Note   Patient Name: Gloria Murphy       Date: 07/18/2019 DOB: 1936/02/15  Age: 84 y.o. MRN#: [id number] Attending Physician: Flora Lipps, MD Primary Care Physician: Carlyle Basques, MD Admit Date: 06/26/2019  Reason for Consultation/Follow-up: Establishing goals of care  Subjective: I saw and examined Gloria Murphy today. Her husband was at the bedside.    We reviewed results of swallow evaluation with no concern for aspiration.  Discussed her intake and she reports that she is "just not hungry."  Denies any nausea or early satiety.    Discussed again regarding PEG tube.  She reports that she is not really wanting to pursue, and I discussed with her husband regarding burdens and concern for lack of benefit from PEG based upon her normal swallow evaluation.  I then called and discussed with her daughter per her request.  Length of Stay: 22  Current Medications: Scheduled Meds:  . amLODipine  10 mg Oral Daily  . aspirin EC  81 mg Oral Daily  . bisacodyl  10 mg Rectal Daily  . carvedilol  6.25 mg Oral BID WC  . chlorhexidine  15 mL Mouth Rinse BID  . Chlorhexidine Gluconate Cloth  6 each Topical Daily  . enoxaparin (LOVENOX) injection  40 mg Subcutaneous Q24H  . feeding supplement  1 Container Oral TID BM  . insulin aspart  0-15 Units Subcutaneous Q6H  . lip balm  1 application Topical BID  . mouth rinse  15 mL Mouth Rinse q12n4p  . metoCLOPramide (REGLAN) injection  5 mg Intravenous Q8H  . pantoprazole  40 mg Oral Q1200  . polyethylene glycol  17 g Oral Daily  . polyvinyl alcohol  1 drop Both Eyes TID  . sodium chloride flush  10-40 mL Intracatheter Q12H  . sodium chloride flush  3 mL Intravenous Q12H    Continuous Infusions: . piperacillin-tazobactam (ZOSYN)   IV 3.375 g (07/18/19 1831)  . TPN ADULT (ION) 85 mL/hr at 07/18/19 1718    PRN Meds: acetaminophen, alum & mag hydroxide-simeth, labetalol, magic mouthwash, metoprolol tartrate, ondansetron (ZOFRAN) IV, phenol, Resource ThickenUp Clear, sodium chloride flush, sodium chloride flush  Physical Exam     -awake, opens eyes, responds to questions appropriately, chronically-ill appearing -Regular rate and rhythm -diminished bilaterally -follows commands   Vital Signs:  BP 121/63 (BP Location: Left Arm)   Pulse 97   Temp 99.6 F (37.6 C) (Oral)   Resp 18   Ht 5\' 9"  (1.753 m)   Wt 69 kg   SpO2 100%   BMI 22.48 kg/m  SpO2: SpO2: 100 % O2 Device: O2 Device: Room Air O2 Flow Rate: O2 Flow Rate (L/min): 2 L/min  Intake/output summary:   Intake/Output Summary (Last 24 hours) at 07/18/2019 2221 Last data filed at 07/18/2019 1831 Gross per 24 hour  Intake 2420.16 ml  Output 2 ml  Net 2418.16 ml   LBM: Last BM Date: 07/17/19 Baseline Weight: Weight: 66.1 kg Most recent weight: Weight: 69 kg       Palliative Assessment/Data: TPN       Patient Active Problem List   Diagnosis Date Noted  . Pressure injury of skin 07/16/2019  . Dysphagia, oropharyngeal 07/13/2019  . FTT (failure to thrive) in adult   . Severe protein-calorie malnutrition ([place])   . Palliative care encounter   . Sinus tachycardia 06/27/2019  . Type 2 diabetes mellitus without complication, without long-term current use of insulin ([place]) 06/26/2019  . SBO (small bowel obstruction) s/p adhesiolysis 07/01/2019 06/26/2019  . AKI (acute kidney injury) ([place]) 06/26/2019  . Nausea and vomiting 06/26/2019  . Abdominal pain 06/26/2019  . Lactic acidosis 06/26/2019  . Elevated troponin 06/26/2019  . Essential hypertension 02/22/2019  . Exertional dyspnea 02/04/2019  . Vision disturbance 12/19/2018  . TIA (transient ischemic attack) 12/19/2018  . Malignant carcinoid tumor to left adnexa 10/06/2017  . Elevated CA-125  08/31/2017    Palliative Care Assessment & Plan   Recommendations/Plan:  DNR/DNI  Gloria Murphy did well with MBS today.  However, she still is very limited in her intake.  Family report that he intake increased in the past when she was placed on medication related to her thyroid (? Tapazole) and a liquid for her appetite (?Megace).  I told family I would discuss further with Dr. Louanne Belton, but believe that we are going to need to work to taper off of TPN in near future.  Family would like to discuss restarting medications for thyroid and appetite, then begin tapering off of TPN with hope her appetite will improve.  PMT will continue to support and follow.    Goals of Care and Additional Recommendations:  Limitations on Scope of Treatment: Full Scope Treatment  Code Status:    Code Status Orders  (From admission, onward)         Start     Ordered   07/05/19 0754  Do not attempt resuscitation (DNR)  Continuous    Question Answer Comment  In the event of cardiac or respiratory ARREST Do not call a "code blue"   In the event of cardiac or respiratory ARREST Do not perform Intubation, CPR, defibrillation or ACLS   In the event of cardiac or respiratory ARREST Use medication by any route, position, wound care, and other measures to relive pain and suffering. May use oxygen, suction and manual treatment of airway obstruction as needed for comfort.      07/05/19 0754        Code Status History    Date Active Date Inactive Code Status Order ID Comments User Context   06/26/2019 1957 07/05/2019 0754 Full Code [id number]  Rhetta Mura DO ED   08/31/2017 1650 09/05/2017 1751 Full Code [id number]  Lahoma Crocker, MD Inpatient   Advance Care Planning Activity  Prognosis:   Guarded  Discharge Planning:  To Be Determined  Care plan was discussed with patient's family (daughter Gloria Murphy via phone and husband in person at the bedside).   Thank you for allowing the Palliative  Medicine Team to assist in the care of this patient.  Time Total: 40 min.   Visit consisted of counseling and education dealing with the complex and emotionally intense issues of symptom management and palliative care in the setting of serious and potentially life-threatening illness.Greater than 50%  of this time was spent counseling and coordinating care related to the above assessment and plan.  Micheline Rough, MD [facility] [phone]  Please contact Palliative Medicine Team phone at [phone] for questions and concerns.

## 2019-07-18 NOTE — Progress Notes (Signed)
17 Days Post-Op    CC: SBO  Subjective: Patient is alert and speaking this AM.  She has a tray in front of her but has taken only the juice.  Still on TPN.  Objective: Vital signs in last 24 hours: Temp:  [98.4 F (36.9 C)-99.4 F (37.4 C)] 99.4 F (37.4 C) (03/11 0622) Pulse Rate:  [80-108] 108 (03/11 0622) Resp:  [20-27] 20 (03/11 0622) BP: (104-127)/(64-69) 127/66 (03/11 0622) SpO2:  [97 %-100 %] 100 % (03/11 0622) Weight:  NN:316265 kg] 69 kg (03/11 0622) Last BM Date: 07/17/19 240 p.o. recorded yesterday 1200 IV Urine x4 Stool x2 T-max 99.4 Respiratory rate remains elevated between 20 - 33 Sats are good on room air, blood pressure stable Labs are stable. Intake/Output from previous day: 03/10 0701 - 03/11 0700 In: 1442.9 [P.O.:240; I.V.:978.7; IV Piggyback:224.2] Out: 1 [Urine:1] Intake/Output this shift: No intake/output data recorded.  General appearance: alert, cooperative and no distress Resp: clear to auscultation bilaterally and Clear anterior. GI: Soft, midline incisions healing well.  I took some the Steri-Strips off the incisions healing nicely.  Lab Results:  Recent Labs    07/16/19 0415 07/18/19 0449  WBC 4.5 4.2  HGB 7.9* 7.9*  HCT 24.6* 24.6*  PLT 324 344    BMET Recent Labs    07/17/19 0340 07/18/19 0449  NA 133* 132*  K 4.0 3.8  CL 103 102  CO2 23 23  GLUCOSE 138* 149*  BUN 24* 24*  CREATININE 0.63 0.64  CALCIUM 8.6* 8.5*   PT/INR Recent Labs    07/18/19 0449  LABPROT 14.4  INR 1.1    Recent Labs  Lab 07/12/19 0758 07/15/19 0430 07/16/19 0415 07/18/19 0449  AST 70* 60* 59* 70*  ALT 85* 70* 62* 82*  ALKPHOS 304* 305* 301* 346*  BILITOT 0.6 0.6 0.6 0.5  PROT 6.8 6.6 6.6 6.8  ALBUMIN 2.3* 2.1* 1.9* 2.1*     Lipase     Component Value Date/Time   LIPASE 38 06/26/2019 1352     Medications: . amLODipine  10 mg Oral Daily  . aspirin EC  81 mg Oral Daily  . bisacodyl  10 mg Rectal Daily  . carvedilol  6.25 mg Oral  BID WC  . chlorhexidine  15 mL Mouth Rinse BID  . Chlorhexidine Gluconate Cloth  6 each Topical Daily  . enoxaparin (LOVENOX) injection  40 mg Subcutaneous Q24H  . feeding supplement  1 Container Oral TID BM  . insulin aspart  0-15 Units Subcutaneous Q6H  . lip balm  1 application Topical BID  . mouth rinse  15 mL Mouth Rinse q12n4p  . metoCLOPramide (REGLAN) injection  5 mg Intravenous Q8H  . pantoprazole  40 mg Oral Q1200  . polyethylene glycol  17 g Oral Daily  . polyvinyl alcohol  1 drop Both Eyes TID  . sodium chloride flush  10-40 mL Intracatheter Q12H  . sodium chloride flush  3 mL Intravenous Q12H    Assessment/Plan Recurrent aspiration last PM?  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 Autumn Messing III; POD# 63 -IV parenteral nutrition for malnutrition and poor oral intake -It may be that she will  need to have enteral nutrition through a feeding gastrostomy tube to wean her off TNA. Will defer to medicine. I discussed with Dr. Neysa Bonito with the hospitalist service. Palliative care following - discussions underway - Husband ASA of 3 years requesting involvement of her sons.Should she require a gastrostomy tube, most likely be best served to see if it could be done percutaneously through interventional radiology and avoid a repeat operation a third time. She has a chronically enlarged stomach with a good window in the left upper quadrant looking at her CT scan this admission -pulm toilet with IS  ID -zosyn 2/17>>2/20, cefotetan periop x1 FEN -IVF,Dysphagia 1, Boost, TPN VTE -SCDs,lovenox Foley -d/c  2/23 Follow up -Dr. Marlou Starks  Plan: From a surgical standpoint there is nothing more to add.  The remaining Steri-Strips can come off any time.  Patient can follow-up with Dr. Marlou Starks in 2 to 3 weeks after discharge from the hospital.  Discharge instructions are in the AVS but she has recovered from her surgery.    LOS: 22 days    Fatimata Talsma 07/18/2019 Please see Amion

## 2019-07-18 NOTE — Progress Notes (Signed)
IR requested by Dr. Louanne Belton for possible image-guided percutaneous gastrostomy tube placement.  CT abdomen/pelvis reviewed by Dr. Pascal Lux who states anatomy amendable to percutaneous gastrostomy tube placement. However, SPL requested that they finish their assessment prior to gtube placement. Chart check today- SPL note recommendations: "Primary obstacle will be pt's motivation to eat and her appetite, but the mechanics of her swallowing are quite good and PEG should be avoided." Will delete order for gtube. Will make Dr. Louanne Belton aware.  IR available in future if needed.   Bea Graff Dianna Ewald, PA-C 07/18/2019, 1:05 PM

## 2019-07-18 NOTE — Discharge Instructions (Signed)
[facility]      [facility] [phone]  OPEN ABDOMINAL SURGERY: POST OP INSTRUCTIONS  Always review your discharge instruction sheet given to you by the facility where your surgery was performed.  IF YOU HAVE DISABILITY OR FAMILY LEAVE FORMS, YOU MUST BRING THEM TO THE OFFICE FOR PROCESSING.  PLEASE DO NOT GIVE THEM TO YOUR DOCTOR.  1. A prescription for pain medication may be given to you upon discharge.  Take your pain medication as prescribed, if needed.  If narcotic pain medicine is not needed, then you may take acetaminophen (Tylenol) or ibuprofen (Advil) as needed. 2. Take your usually prescribed medications unless otherwise directed. 3. If you need a refill on your pain medication, please contact your pharmacy. They will contact our office to request authorization.  Prescriptions will not be filled after 5pm or on week-ends. 4. You should follow a light diet the first few days after arrival home, such as soup and crackers, pudding, etc.unless your doctor has advised otherwise. A high-fiber, low fat diet can be resumed as tolerated.   Be sure to include lots of fluids daily. Most patients will experience some swelling and bruising on the chest and neck area.  Ice packs will help.  Swelling and bruising can take several days to resolve 5. Most patients will experience some swelling and bruising in the area of the incision. Ice pack will help. Swelling and bruising can take several days to resolve..  6. It is common to experience some constipation if taking pain medication after surgery.  Increasing fluid intake and taking a stool softener will usually help or prevent this problem from occurring.  A mild laxative (Milk of Magnesia or Miralax) should be taken according to package directions if there are no bowel movements after 48 hours. 7.  You may have steri-strips (small skin tapes) in place directly over the incision.  These strips should be left on the skin for 7-10 days.  If your  surgeon used skin glue on the incision, you may shower in 24 hours.  The glue will flake off over the next 2-3 weeks.  Any sutures or staples will be removed at the office during your follow-up visit. You may find that a light gauze bandage over your incision may keep your staples from being rubbed or pulled. You may shower and replace the bandage daily. 8. ACTIVITIES:  You may resume regular (light) daily activities beginning the next day--such as daily self-care, walking, climbing stairs--gradually increasing activities as tolerated.  You may have sexual intercourse when it is comfortable.  Refrain from any heavy lifting or straining until approved by your doctor. a. You may drive when you no longer are taking prescription pain medication, you can comfortably wear a seatbelt, and you can safely maneuver your car and apply brakes b. Return to Work: ___________________________________ 9. You should see your doctor in the office for a follow-up appointment approximately two weeks after your surgery.  Make sure that you call for this appointment within a day or two after you arrive home to insure a convenient appointment time. OTHER INSTRUCTIONS:  _____________________________________________________________ _____________________________________________________________  WHEN TO CALL YOUR DOCTOR: 1. Fever over 101.0 2. Inability to urinate 3. Nausea and/or vomiting 4. Extreme swelling or bruising 5. Continued bleeding from incision. 6. Increased pain, redness, or drainage from the incision. 7. Difficulty swallowing or breathing 8. Muscle cramping or spasms. 9. Numbness or tingling in hands or feet or around lips.  The clinic staff is available to   answer your questions during regular business hours.  Please don't hesitate to call and ask to speak to one of the nurses if you have concerns.  For further questions, please visit [email or web address]   

## 2019-07-18 NOTE — Progress Notes (Signed)
PROGRESS NOTE  MERI WILHELMI [id number] DOB: 1936-01-25 DOA: 06/26/2019 PCP: Carlyle Basques, MD   LOS: 22 days   Brief narrative: As per HPI,  84 year old woman with history of carcinoid tumor with history of extensive surgery admitted from home on 2/17 due to nausea and vomiting with decreased p.o. intake.She was found to have bowel obstruction due to adhesions. Patient was taken to the OR on 07/01/2019 for lysis of adhesions. Again she ended up having recurrence of small bowel obstruction. She was started on clear liquids on 07/09/2019. Transfer to med telemetry unit 3/3. She has continued to have poor p.o. intake and had worsening encephalopathy throughout hospitalization.   Palliative care has been consulted to discuss goals of care.  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 Completed Unasyn 2/26->3/3 for aspiration pneumonia.  High aspiration risk.  Rapid response with possible for aspiration, so antibiotics have been restarted since 07/15/2019.  Currently on Zosyn.  Underwent speech and swallow evaluation today with no aspiration.  Recommended dysphagia 3 diet.  Recurrent SBO s/p exploratory laparotomy with lysis of adhesions (07/01/2019), history of multiple abdominal surgeries / Failure to thrive  Patient has been off NG tube since at 2/28.  Currently getting TPN.   Spoke about her feeding tube yesterday with her family.at this time patient swallow has improved and speech therapy has recommended dysphagia 3 diet.  If she is able patient with eat okay will hold off with PEG tube placement.  Spoke with palliative care.  AKI secondary to intravascular volume depletion, Resolved with IV fluids  Acute metabolic encephalopathy, multifactorial etiology: hyponatremia, hospital-acquired delirium, Medication induced, SIRS, Hypoxia in setting of dementia.  Improved. Continue delirium precaution.  Off methimazole morphine methocarbamol tramadol.  Essential hypertension On metoprolol and labetalol IV at this time.  Could consider changing to p.o. once swallow is okay.  Type 2 diabetes mellitus On TPN.  Continue sliding scale insulin Accu-Cheks.  Hyponatremia  mild.  Will closely monitor on TPN.  Sodium of 132 today  Hypokalemia, resolved.  We will continue to monitor closely.  Fraction of 3.8 today  Hyperthyroidism  off methimazole at this time.  Check TSH T3-T4.  Elevated LFTs, possibly from TPN.  Check CMP in a.m.  ALT 82 and AST 70  History of pelvic carcinoid tumor followed by Dr. Denman George.  Had extensive surgery in the past.  Failure to thrive, severe protein calorie malnutrition.  Currently on TPN.  Dysphagia 3 diet has been initiated today.  Will closely monitor.  VTE Prophylaxis: Lovenox  Code Status: DO NOT RESUSCITATE  Family Communication: None today.  I have prolonged discussion with the patient's husband and daughter yesterday.   Disposition Plan:  . Patient is from home . Likely disposition to skilled facility likely in 2 to 3 days . Barriers to discharge: On TPN, poor oral intake, initiated on dysphagia 3 diet today.  Consultants:  General surgery  Palliative care  Procedures:  PICC line placement and TPN.  NG tube placement  Laboratory laparotomy with lysis of adhesions on 07/01/2019.  Staples removed  07/12/19  Antibiotics:  . Zosyn 07/15/2019>>  Anti-infectives (From admission, onward)   Start     Dose/Rate Route Frequency Ordered Stop   07/15/19 1000  piperacillin-tazobactam (ZOSYN) IVPB 3.375 g     3.375 g 12.5 mL/hr over 240 Minutes Intravenous Every 8 hours 07/15/19 0825     07/05/19 1700  Ampicillin-Sulbactam (UNASYN) 3 g in sodium chloride 0.9 % 100 mL IVPB  Status:  Discontinued     3 g 200 mL/hr over 30 Minutes Intravenous Every 6 hours 07/05/19 1619 07/10/19 0715   07/01/19 1015  cefoTEtan (CEFOTAN) 2 g in sodium chloride 0.9 % 100 mL IVPB     2 g 200 mL/hr over 30 Minutes Intravenous On call to O.R. 07/01/19 0847 07/01/19 1234   06/27/19 1000  piperacillin-tazobactam (ZOSYN) IVPB 3.375 g  Status:  Discontinued     3.375 g 12.5 mL/hr over 240 Minutes Intravenous Every 8 hours 06/27/19 0807 06/29/19 1119   06/27/19 0200  piperacillin-tazobactam (ZOSYN) IVPB 2.25 g  Status:  Discontinued     2.25 g 100 mL/hr over 30 Minutes Intravenous Every 8 hours 06/26/19 2012 06/27/19 0807   06/26/19 1700  piperacillin-tazobactam (ZOSYN) IVPB 3.375 g     3.375 g 100 mL/hr over 30 Minutes Intravenous  Once 06/26/19 1651 06/26/19 1823      Subjective:  Today, patient was seen and examined at bedside.  Status post barium swallow with progression of swallowing.  Complains of mild cough but denies any nausea or vomiting fever or chills.  Objective: Vitals:   07/18/19 0622 07/18/19 1310  BP: 127/66 120/62  Pulse: (!) 108 99  Resp: 20   Temp: 99.4 F (37.4 C)   SpO2: 100% 100%    Intake/Output Summary (Last 24 hours) at 07/18/2019 1313 Last data filed at 07/18/2019 0949 Gross per 24 hour  Intake 1867.61 ml  Output 3 ml  Net 1864.61 ml   Filed Weights   07/16/19 0500 07/17/19 0445 07/18/19 0622  Weight: 69.2 kg 66.8 kg 69 kg   Body mass index is 22.48 kg/m.   Physical Exam: GENERAL: Patient is alert awake and communicative.  Not in obvious distress.  Oriented to person and  place HENT: No scleral pallor or icterus. Pupils equally reactive to light. Oral mucosa is moist NECK: is supple, no gross swelling noted. CHEST: Clear to auscultation. No crackles or wheezes.  Diminished breath sounds bilaterally. CVS: S1 and S2 heard, no murmur. Regular rate and rhythm.  ABDOMEN: Soft, non-tender, bowel sounds are present.  Status post removal of staples.   EXTREMITIES: No edema.  Right upper extremity PICC line in place. CNS: Cranial nerves are intact. No focal motor deficits. SKIN: warm and dry, surgical incision of the abdomen  Data Review: I have personally reviewed the following laboratory data and studies,  CBC: Recent Labs  Lab 07/13/19 0359 07/14/19 0430 07/15/19 0430 07/16/19 0415 07/18/19 0449  WBC 5.7 5.2 5.4 4.5 4.2  NEUTROABS  --   --  4.1  --   --   HGB 8.5* 8.1* 8.7* 7.9* 7.9*  HCT 26.2* 25.9* 26.8* 24.6* 24.6*  MCV 79.6* 80.9 80.0 78.3* 79.1*  PLT 345 315 342 324 [id number]   Basic Metabolic Panel: Recent Labs  Lab 07/13/19 0359 07/13/19 0359 07/14/19 0430 07/15/19 0430 07/16/19 0415 07/17/19 0340  07/18/19 0449  NA 131*   < > 131* 132* 133* 133* 132*  K 4.0   < > 4.1 4.2 4.0 4.0 3.8  CL 105   < > 106 106 104 103 102  CO2 20*   < > 20* 18* 22 23 23   GLUCOSE 118*   < > 118* 145* 160* 138* 149*  BUN 19   < > 22 22 20  24* 24*  CREATININE 0.62   < > 0.45 0.66 0.60 0.63 0.64  CALCIUM 8.6*   < > 8.6* 8.7* 8.6* 8.6* 8.5*  MG 1.7  --  1.9 1.8  --  1.8 2.1  PHOS 3.3  --  3.2 3.0  --  3.8 3.1   < > = values in this interval not displayed.   Liver Function Tests: Recent Labs  Lab 07/12/19 0758 07/15/19 0430 07/16/19 0415 07/18/19 0449  AST 70* 60* 59* 70*  ALT 85* 70* 62* 82*  ALKPHOS 304* 305* 301* 346*  BILITOT 0.6 0.6 0.6 0.5  PROT 6.8 6.6 6.6 6.8  ALBUMIN 2.3* 2.1* 1.9* 2.1*   No results for input(s): LIPASE, AMYLASE in the last 168 hours. Recent Labs  Lab 07/13/19 1600  AMMONIA 16   Cardiac Enzymes: No results for input(s):  CKTOTAL, CKMB, CKMBINDEX, TROPONINI in the last 168 hours. BNP (last 3 results) No results for input(s): BNP in the last 8760 hours.  ProBNP (last 3 results) No results for input(s): PROBNP in the last 8760 hours.  CBG: Recent Labs  Lab 07/17/19 1209 07/17/19 1814 07/18/19 0000 07/18/19 0516 07/18/19 1230  GLUCAP 168* 171* 147* 140* 152*   Recent Results (from the past 240 hour(s))  Culture, blood (routine x 2)     Status: None (Preliminary result)   Collection Time: 07/15/19  9:08 AM   Specimen: BLOOD  Result Value Ref Range Status   Specimen Description   Final    BLOOD RIGHT ANTECUBITAL Performed at [facility], [place] [address], [place], [place] [address]    Special Requests   Final    BOTTLES DRAWN AEROBIC AND ANAEROBIC Blood Culture adequate volume Performed at [facility] [address], [place], [place] [address]    Culture   Final    NO GROWTH 3 DAYS Performed at [facility], [place] [address], [place], [place] [address]    Report Status PENDING  Incomplete  Culture, blood (routine x 2)     Status: None (Preliminary result)   Collection Time: 07/15/19  9:14 AM   Specimen: BLOOD RIGHT HAND  Result Value Ref Range Status   Specimen Description   Final    BLOOD RIGHT HAND Performed at [facility] [address] [place], [place] [address]    Special Requests   Final    BOTTLES DRAWN AEROBIC ONLY Blood Culture adequate volume Performed at [facility] [address] [place], [place] [address]    Culture   Final    NO GROWTH 3 DAYS Performed at [facility], [place] [address], [place], [place] [address]    Report Status PENDING  Incomplete     Studies: DG Swallowing Func-Speech Pathology  Result Date: 07/18/2019 Objective Swallowing Evaluation: Type of Study: MBS-Modified Barium Swallow Study  Patient Details Name: JAZZMEN RUBENS MRN: [id number] Date of Birth: 10/20/1935  Today's Date: 07/18/2019 Time: SLP Start Time (ACUTE ONLY): 1210 -SLP Stop Time (ACUTE ONLY): 1225 SLP Time Calculation (min) (ACUTE  ONLY): 15 min Past Medical History: Past Medical History: Diagnosis Date . Diabetes mellitus without complication ([place])  . High cholesterol  . Hypertension  . Thyroid disease  Past Surgical History: Past Surgical History: Procedure Laterality Date . ABDOMINAL HYSTERECTOMY   . EYE SURGERY Bilateral 06/2016  Cataract . KNEE SURGERY Right  . LAPAROTOMY N/A 08/31/2017  Procedure: EXPLORATORY LAPAROTOMY; BILATERAL SALPINGOOPHERECTOMY; OMENTECTOMY, TUMOR DEBULKING;  Surgeon: Everitt Amber, MD;  Location: [facility];  Service: Gynecology;  Laterality: N/A; . LAPAROTOMY N/A 07/01/2019  Procedure: EXPLORATORY LAPAROTOMY;  Surgeon: Jovita Kussmaul, MD;  Location: [facility];  Service: General;  Laterality: N/A; . MASS EXCISION N/A 08/31/2017  Procedure: RESECTION OF PELVIC MASS;  Surgeon: Everitt Amber, MD;  Location: [facility];  Service: Gynecology;  Laterality: N/A; . RIGHT HEART CATH N/A 02/11/2019  Procedure: RIGHT HEART CATH;  Surgeon: Nigel Mormon, MD;  Location: [place] [place] [facility];  Service: Cardiovascular;  Laterality: N/A; HPI: Pt is an 84 yo female adm to [facility] with SBO s/p OR 2/22.  Pt has had several NG tubes placed but not one currently.  Pt also with h/o pelvic carcinoid tumor that caused the SBO.  Swallow evaluation ordered due to concerns for dysphagia, DM.  She has been treated with ABX for potential aspiration pna.   Subjective: alert, cooperative Assessment / Plan / Recommendation CHL IP CLINICAL IMPRESSIONS 07/18/2019 Clinical Impression Pt presented with a normal pharyngeal swallow and mild deficits in the oral phase of the swallow.  There was prolonged oral manipulation and decreased cohesion of bolus material  (consistent with confusion, cognitive deficits).  However, material was propelled through pharynx easily.  Airway protection was reliable with consistent laryngeal vestibule closure  and no penetration nor aspiration. Multiple trials were executed, and pt was taxed with mixed solid and liquid consistencies in order to test her mechanics - however, she did not aspirate.  Pt did cough throughout the study - this was NOT related to airway invasion.  Recommend allowing dysphagia 3 diet with chopped meats; thin liquids; give meds whole in puree.  Primary obstacle will be pt's motivation to eat and her appetite, but the mechanics of her swallowing are quite good.  D/W Dr. Domingo Cocking and RN.  No further SLP f/u is needed - our service will sign off.  SLP Visit Diagnosis Dysphagia, oral phase (R13.11) Attention and concentration deficit following -- Frontal lobe and executive function deficit following -- Impact on safety and function --   CHL IP TREATMENT RECOMMENDATION 07/18/2019 Treatment Recommendations No treatment recommended at this time   Prognosis 07/09/2019 Prognosis for Safe Diet Advancement Good Barriers to Reach Goals Cognitive deficits Barriers/Prognosis Comment -- CHL IP DIET RECOMMENDATION 07/18/2019 SLP Diet Recommendations Dysphagia 3 (Mech soft) solids;Thin liquid Liquid Administration via Cup;Straw Medication Administration Whole meds with puree Compensations -- Postural Changes --   CHL IP OTHER RECOMMENDATIONS 07/18/2019 Recommended Consults -- Oral Care Recommendations Oral care BID Other Recommendations --   CHL IP FOLLOW UP RECOMMENDATIONS 07/18/2019 Follow up Recommendations None   CHL IP FREQUENCY AND DURATION 07/09/2019 Speech Therapy Frequency (ACUTE ONLY) min 2x/week Treatment Duration 1 week      CHL IP ORAL PHASE 07/18/2019 Oral Phase Impaired Oral - Pudding Teaspoon -- Oral - Pudding Cup -- Oral - Honey Teaspoon -- Oral - Honey Cup -- Oral - Nectar Teaspoon -- Oral - Nectar Cup Decreased bolus cohesion Oral - Nectar Straw -- Oral - Thin Teaspoon -- Oral - Thin Cup -- Oral - Thin Straw  Decreased bolus cohesion Oral - Puree Decreased bolus cohesion;Delayed oral transit Oral - Mech  Soft Decreased bolus cohesion;Delayed oral transit Oral - Regular -- Oral - Multi-Consistency -- Oral - Pill -- Oral Phase - Comment --  CHL IP PHARYNGEAL PHASE 07/18/2019 Pharyngeal Phase WFL Pharyngeal- Pudding Teaspoon -- Pharyngeal -- Pharyngeal- Pudding Cup -- Pharyngeal -- Pharyngeal- Honey Teaspoon -- Pharyngeal -- Pharyngeal- Honey Cup -- Pharyngeal -- Pharyngeal- Nectar Teaspoon -- Pharyngeal -- Pharyngeal- Nectar Cup -- Pharyngeal -- Pharyngeal- Nectar Straw -- Pharyngeal -- Pharyngeal- Thin Teaspoon -- Pharyngeal -- Pharyngeal- Thin Cup -- Pharyngeal -- Pharyngeal- Thin Straw -- Pharyngeal -- Pharyngeal- Puree -- Pharyngeal -- Pharyngeal- Mechanical Soft -- Pharyngeal -- Pharyngeal- Regular -- Pharyngeal -- Pharyngeal- Multi-consistency -- Pharyngeal -- Pharyngeal- Pill -- Pharyngeal -- Pharyngeal Comment --  No flowsheet data found. Juan Quam Laurice 07/18/2019, 12:54 PM                Flora Lipps, MD  [facility] 07/18/2019

## 2019-07-18 NOTE — Progress Notes (Signed)
Modified Barium Swallow Progress Note  Patient Details  Name: Gloria Murphy MRN: [id number] Date of Birth: 1935/10/25  Today's Date: 07/18/2019  Modified Barium Swallow completed.  Full report located under Chart Review in the Imaging Section.  Brief recommendations include the following:  Clinical Impression  Pt presented with a normal pharyngeal swallow and mild deficits in the oral phase of the swallow.  There was prolonged oral manipulation and decreased cohesion of bolus material  (consistent with confusion, cognitive deficits).  However, material was propelled through pharynx easily.  Airway protection was reliable with consistent laryngeal vestibule closure and no penetration nor aspiration. Multiple trials were executed, and pt was taxed with mixed solid and liquid consistencies in order to test her mechanics - however, she did not aspirate.  Pt did cough throughout the study - this was NOT related to airway invasion.  Recommend allowing dysphagia 3 diet with chopped meats; thin liquids; give meds whole in puree.  Primary obstacle will be pt's motivation to eat and her appetite, but the mechanics of her swallowing are quite good and PEG should be avoided.  D/W Dr. Domingo Cocking and RN.  No further SLP f/u is needed - our service will sign off.    Swallow Evaluation Recommendations       SLP Diet Recommendations: Dysphagia 3 (Mech soft) solids;Thin liquid   Liquid Administration via: Cup;Straw   Medication Administration: Whole meds with puree               Oral Care Recommendations: Oral care BID       Haileigh Pitz L. Tivis Ringer, [place] Office number [phone] Pager [phone]  Juan Quam Laurice 07/18/2019,12:51 PM

## 2019-07-18 NOTE — Progress Notes (Signed)
OT Cancellation Note  Patient Details Name: Gloria Murphy MRN: [id number] DOB: 11-Jun-1935   Cancelled Treatment:    Reason Eval/Treat Not Completed: Patient at procedure or test/ unavailable Patient at swallow study during attempt in am.  Shellia Cleverly 07/18/2019, 4:19 PM

## 2019-07-18 NOTE — Progress Notes (Signed)
Pharmacy Antibiotic Note  Gloria Murphy is a 84 y.o. female admitted on 06/26/2019 with asp PNA.  Pharmacy has been consulted for piperacillin/tazobactam dosing.  Pt was previously on ampicillin/sulbactam from 2/26 - 3/3 at which time ABX were stopped. Piperacillin/tazobactam initiated on 3/8 when patient was febrile.  Today, 07/18/19 -WBC WNL -SCr WNL, stable. CrCl > 50 mL/min -Afebrile  Day #4 piperacillin/tazobactam  Plan:  Continue piperacillin/tazobactam 3.375 g IV q8h EI  Height: 5\' 9"  (175.3 cm) Weight: 152 lb 3.2 oz (69 kg) IBW/kg (Calculated) : 66.2  Temp (24hrs), Avg:98.8 F (37.1 C), Min:98.4 F (36.9 C), Max:99.4 F (37.4 C)  Recent Labs  Lab 07/13/19 0359 07/13/19 0359 07/14/19 0430 07/15/19 0430 07/15/19 0908 07/15/19 1304 07/16/19 0415 07/17/19 0340 07/18/19 0449  WBC 5.7  --  5.2 5.4  --   --  4.5  --  4.2  CREATININE 0.62   < > 0.45 0.66  --   --  0.60 0.63 0.64  LATICACIDVEN  --   --   --   --  1.1 1.1  --   --   --    < > = values in this interval not displayed.    Estimated Creatinine Clearance: 57.6 mL/min (by C-G formula based on SCr of 0.64 mg/dL).    No Known Allergies  Lenis Noon, PharmD 07/18/2019 7:21 AM

## 2019-07-18 NOTE — Progress Notes (Signed)
PHARMACY - TOTAL PARENTERAL NUTRITION CONSULT NOTE   Indication: bowel obstruction, prolonged ileus  Patient Measurements: Height: '5\' 9"'$  (175.3 cm) Weight: 152 lb 3.2 oz (69 kg) IBW/kg (Calculated) : 66.2 TPN AdjBW (KG): 69 Body mass index is 22.48 kg/m.  Assessment: 84 yo woman with hx of pelvic carcinoid tumor, admitted with N/V, decreased PO intake was found to have SBO and gastric distention.  Glucose / Insulin: Hx DM2, diet-controlled; CBGs mostly at goal (116-171) - 15 units insulin in TPN, 10 units SSI given in past 24hrs  Electrolytes: Na 132 slightly low but stable, CO2 WNL after maximizing acetate in TPN, other lytes WNL & stable (corrected Ca = 10) Renal: SCr WNL/stable; UOP adequate; BUN slightly elevated LFTs / TGs: AST/ALT mildly elevated but trending down, Alk phos stable ~300; TBili stable WNL, TGs stable WNL (3/8) Prealbumin: 16.9 > 12.1 > 7.6 > 7.8 (3/8)  Intake / Output; MIVF: Strict I/O not being measured, no MIVF currently   GI Imaging: 2/27 Abdominal CT: findings c/w SBO 3/1 KUB: ileus vs obstruction, interval improvement in small bowel dilatation Surgeries / Procedures: 2/22 exploratory laparotomy (n/a) lysis of adhesions Central access: PICC 2/22, pt pulled PICC out on 2/23 ~ 2100,  re-insert PICC 2/26 and restart TPN TPN start date: 2/22-2/23, 2/26 >>   Nutritional Goals RD recs (3/8): KCal: 2000-2200 , Protein: 100-115 gm , Fluid: >/= 2 L/day  Goal TPN rate at 85 mL/hr (provides 102 g of protein and 2060 kcals per day) Protein: 50 g/L Lipids 30 g/L Dextrose 15% (GIR 3.18)  Current Nutrition:  Dysphagia I diet, Boost Breeze TID TPN at goal rate  Plan:   Continue TPN at goal rate 85 ml/hr  Electrolytes in TPN: No changes  90 mEq/L of Na, 13mq/L of K, 589m/L of Ca, 7 mEq/L of Mg, and 20 mmol/L of Phos, max Acetate  Add trace elements to TPN daily and standard MVI on MWF only due to national backorder  Continue 15 units insulin in  TPN  Continue mSSI q6h  IVF per MD, none at the current time  Monitor TPN labs on Mon/Thurs  F/u plans for PEG tube and ability to wean TPN  MaLenis NoonPharmD 07/18/19 7:41 AM

## 2019-07-19 LAB — COMPREHENSIVE METABOLIC PANEL
ALT: 84 U/L — ABNORMAL HIGH (ref 0–44)
AST: 77 U/L — ABNORMAL HIGH (ref 15–41)
Albumin: 2 g/dL — ABNORMAL LOW (ref 3.5–5.0)
Alkaline Phosphatase: 382 U/L — ABNORMAL HIGH (ref 38–126)
Anion gap: 9 (ref 5–15)
BUN: 22 mg/dL (ref 8–23)
CO2: 22 mmol/L (ref 22–32)
Calcium: 8.8 mg/dL — ABNORMAL LOW (ref 8.9–10.3)
Chloride: 102 mmol/L (ref 98–111)
Creatinine, Ser: 0.64 mg/dL (ref 0.44–1.00)
GFR calc Af Amer: >60 mL/min (ref >60)
GFR calc non Af Amer: >60 mL/min (ref >60)
Glucose, Bld: 141 mg/dL — ABNORMAL HIGH (ref 70–99)
Potassium: 3.9 mmol/L (ref 3.5–5.1)
Sodium: 133 mmol/L — ABNORMAL LOW (ref 135–145)
Total Bilirubin: 0.7 mg/dL (ref 0.3–1.2)
Total Protein: 7.1 g/dL (ref 6.5–8.1)

## 2019-07-19 LAB — GLUCOSE, CAPILLARY
Glucose-Capillary: 127 mg/dL — ABNORMAL HIGH (ref 70–99)
Glucose-Capillary: 136 mg/dL — ABNORMAL HIGH (ref 70–99)
Glucose-Capillary: 143 mg/dL — ABNORMAL HIGH (ref 70–99)

## 2019-07-19 LAB — CBC
HCT: 25.7 % — ABNORMAL LOW (ref 36.0–46.0)
Hemoglobin: 8.1 g/dL — ABNORMAL LOW (ref 12.0–15.0)
MCH: 25.2 pg — ABNORMAL LOW (ref 26.0–34.0)
MCHC: 31.5 g/dL (ref 30.0–36.0)
MCV: 80.1 fL (ref 80.0–100.0)
Platelets: 363 10*3/uL (ref 150–400)
RBC: 3.21 MIL/uL — ABNORMAL LOW (ref 3.87–5.11)
RDW: 16.5 % — ABNORMAL HIGH (ref 11.5–15.5)
WBC: 5 10*3/uL (ref 4.0–10.5)
nRBC: 0 % (ref 0.0–0.2)

## 2019-07-19 LAB — T3, FREE: T3, Free: 4.4 pg/mL (ref 2.0–4.4)

## 2019-07-19 LAB — PHOSPHORUS: Phosphorus: 2.8 mg/dL (ref 2.5–4.6)

## 2019-07-19 LAB — MAGNESIUM: Magnesium: 1.7 mg/dL (ref 1.7–2.4)

## 2019-07-19 MED ORDER — MAGNESIUM SULFATE IN D5W 1-5 GM/100ML-% IV SOLN
1.0000 g | Freq: Once | INTRAVENOUS | Status: AC
  Administered 2019-07-19: 10:00:00 1 g via INTRAVENOUS
  Filled 2019-07-19: qty 100

## 2019-07-19 MED ORDER — MEGESTROL ACETATE 400 MG/10ML PO SUSP
400.0000 mg | Freq: Two times a day (BID) | ORAL | Status: DC
  Administered 2019-07-19 – 2019-07-23 (×9): 400 mg via ORAL
  Filled 2019-07-19 (×9): qty 10

## 2019-07-19 MED ORDER — TRAVASOL 10 % IV SOLN
INTRAVENOUS | Status: AC
  Filled 2019-07-19: qty 480

## 2019-07-19 MED ORDER — GUAIFENESIN 100 MG/5ML PO SOLN
15.0000 mL | Freq: Once | ORAL | Status: AC
  Administered 2019-07-19: 300 mg via ORAL
  Filled 2019-07-19: qty 20
  Filled 2019-07-19: qty 10

## 2019-07-19 MED ORDER — GUAIFENESIN 100 MG/5ML PO SOLN: 5.0000 mL | ORAL | Status: DC | PRN

## 2019-07-19 MED ORDER — BENEPROTEIN PO POWD
2.0000 | Freq: Three times a day (TID) | ORAL | Status: DC
  Filled 2019-07-19: qty 227

## 2019-07-19 MED ORDER — GUAIFENESIN 100 MG/5ML PO SOLN
5.0000 mL | Freq: Once | ORAL | Status: AC
  Administered 2019-07-19: 100 mg via ORAL
  Filled 2019-07-19: qty 10

## 2019-07-19 MED ORDER — RESOURCE INSTANT PROTEIN PO PWD PACKET
2.0000 | Freq: Three times a day (TID) | ORAL | Status: DC
  Administered 2019-07-19 – 2019-07-23 (×12): 12 g via ORAL
  Filled 2019-07-19 (×14): qty 12

## 2019-07-19 NOTE — Progress Notes (Signed)
PHARMACY - TOTAL PARENTERAL NUTRITION CONSULT NOTE   Indication: bowel obstruction, prolonged ileus  Patient Measurements: Height: '5\' 9"'$  (175.3 cm) Weight: 152 lb 3.2 oz (69 kg) IBW/kg (Calculated) : 66.2 TPN AdjBW (KG): 69 Body mass index is 22.48 kg/m.  Assessment: 84 yo woman with hx of pelvic carcinoid tumor, admitted with N/V, decreased PO intake was found to have SBO and gastric distention.  Glucose / Insulin: Hx DM2, diet-controlled; CBGs at goal (136-157) - 15 units insulin in TPN, 10 units SSI given in past 24hrs  Electrolytes: Na 133 slightly low but stable, CO2 WNL after maximizing acetate in TPN, Corr Ca (10.4) at upper end of normal; Mg (1.7) lower end of normal. Other lytes WNL. Renal: SCr WNL/stable; UOP adequate; BUN slightly elevated LFTs / TGs: AST/ALT mildly elevated but stable, Alk phos elevated (382, trending up); TBili stable WNL, TGs stable WNL (3/8) Prealbumin: 16.9 > 12.1 > 7.6 > 7.8 (3/8)  Intake / Output; MIVF: Strict I/O not being measured, no MIVF currently GI Imaging: 2/27 Abdominal CT: findings c/w SBO 3/1 KUB: ileus vs obstruction, interval improvement in small bowel dilatation Surgeries / Procedures: 2/22 exploratory laparotomy (n/a) lysis of adhesions Central access: PICC 2/22, pt pulled PICC out on 2/23 ~ 2100,  re-insert PICC 2/26 and restart TPN TPN start date: 2/22-2/23, 2/26 >>   Nutritional Goals RD recs (3/8): KCal: 2000-2200 , Protein: 100-115 gm , Fluid: >/= 2 L/day  Goal TPN rate of 85 mL/hr (provides 102 g of protein and 2060 kcals per day) Protein: 50 g/L Lipids 30 g/L Dextrose 15% (GIR 3.18)  Current Nutrition:  Dysphagia III diet, Boost Breeze TID TPN at goal rate  Pt had normal swallow exam on 3/11, determined that PEG tube not needed.   Plan:  Now: -Magnesium sulfate 1 g IV once  At 1800:  Decrease TPN to 40 mL/hr (half of goal rate) tonight - begin taper per discussion with MD. Hopefully this will also stimulate  appetite.  Electrolytes in TPN: Decrease Ca, no other changes  90 mEq/L of Na, 42mq/L of K, 325m/L of Ca, 7 mEq/L of Mg, and 20 mmol/L of Phos, max Acetate  Add trace elements to TPN daily and standard MVI on MWF only due to national backorder  Decrease insulin in TPN to 8 units  Continue mSSI q6h  IVF per MD, none at the current time  Monitor TPN labs on Mon/Thurs. Recheck electrolytes tomorrow since reducing TPN rate  Follow along for toleration of diet, hopefully can wean off TPN tomorrow.   MaLenis NoonPharmD 07/19/19 7:38 AM

## 2019-07-19 NOTE — Progress Notes (Addendum)
PROGRESS NOTE  Gloria Murphy [id number] DOB: 13-Oct-1935 DOA: 06/26/2019 PCP: Carlyle Basques, MD   LOS: 23 days   Brief narrative: As per HPI,  84 year old woman with history of carcinoid tumor with history of extensive surgery admitted from home on 2/17 due to nausea and vomiting with decreased p.o. intake.She was found to have bowel obstruction due to adhesions. Patient was taken to the OR on 07/01/2019 for lysis of adhesions. Again she ended up having recurrence of small bowel obstruction. She was started on clear liquids on 07/09/2019. Transfer to med telemetry unit 3/3. She has continued to have poor p.o. intake and had worsening encephalopathy throughout hospitalization.   Palliative care on board regarding discuss goals of care.  Assessment/Plan:  Principal Problem:   SBO (small bowel obstruction) s/p adhesiolysis 07/01/2019 Active Problems:   Malignant carcinoid tumor to left adnexa   Essential hypertension   Type 2 diabetes mellitus without complication, without long-term current use of insulin (HCC)   AKI (acute kidney injury) ([place])   Nausea and vomiting   Abdominal pain   Lactic acidosis   Elevated troponin   Sinus tachycardia   Dysphagia, oropharyngeal   FTT (failure to thrive) in adult   Severe protein-calorie malnutrition ([facility])   Palliative care encounter   Pressure injury of skin  SIRS / Acute hypoxic respiratory failure, concern for recurrent aspiration pneumonia.  Completed Unasyn 2/26->3/3 for aspiration pneumonia.  High aspiration risk.  Had rapid response again with possible for aspiration, so antibiotics have been restarted since 07/15/2019.  Currently on Zosyn, will aim for total of  5 days. Underwent speech and swallow evaluation on 07/18/2019 with no aspiration.  Recommended dysphagia 3 diet. Has poor appetite.  Recurrent SBO s/p exploratory laparotomy with lysis of adhesions (07/01/2019), history of multiple abdominal surgeries / Failure to  thrive  Patient has been off NG tube since at 2/28.  Currently on TPN for prolonged ileus and poor oral intake..  Patient has been started on dysphagia 3 diet.  Holding off PEG tube placement at this time.  Palliative care on board.  Will have to see if the patient is able to eat by herself.  Will decrease the TPN to half today.  Add Megace.  Dietary on board.   Only couple of bites in breakfast and lunch as per nursing staff.   AKI secondary to intravascular volume depletion.  Resolved.  Acute metabolic encephalopathy, multifactorial from hyponatremia, hospital-acquired delirium, Medication induced, SIRS, Hypoxia in setting of dementia.  Improved. Continue delirium precaution.  Off morphine, methocarbamol and tramadol.    Essential hypertension  On metoprolol and labetalol IV at this time.  Could consider changing to p.o. once swallow is okay.  Type 2 diabetes mellitus On TPN.  Continue sliding scale insulin, Accu-Cheks.  Hyponatremia  mild.  Will closely monitor on TPN.  Sodium of 132 today  Hypokalemia, resolved.  We will continue to monitor closely.  Fraction of 3.8 today  Thyroid issues.  Patient's husband says that it was once a day p.o. it appears that it might be   Synthroid for hypothyroidism.  At this time, her TSH is suppressed and T4 is mildly elevated while T3 is normal.  We will hold off with thyroid medications at this time.  Elevated LFTs, possibly from TPN.  Still mildly elevated.  History of pelvic carcinoid tumor followed by Dr. Denman George.  Had extensive surgery in the past.  Failure to thrive, severe protein calorie malnutrition.  Currently on TPN.  Dysphagia  3 diet .  Decrease TPN today to allow for oral intake..  Encourage oral intake.  Will closely monitor.  VTE Prophylaxis: Lovenox  Code Status: DO NOT RESUSCITATE  Family Communication: I again had a prolonged discussion with the patient's husband and daughter today.  Not wanting to go with PEG tube.  Patient also  does not wish for PEG tube.  Family is very optimistic about her appetite improving over the weekend.  Disposition Plan:  . Patient is from home . Likely disposition to skilled facility likely in 2 to 3 days if able to take p.o. . Barriers to discharge: On TPN, poor oral intake, initiated on dysphagia 3, add calorie count.  Consultants:  General surgery  Palliative care  thyroid medication at this time.   Procedures:  PICC line placement and TPN.  NG tube placement  Laboratory laparotomy with lysis of adhesions on 07/01/2019.  Staples removed 07/12/19  Antibiotics:  . Zosyn 07/15/2019>>  Anti-infectives (From admission, onward)   Start     Dose/Rate Route Frequency Ordered Stop   07/15/19 1000  piperacillin-tazobactam (ZOSYN) IVPB 3.375 g     3.375 g 12.5 mL/hr over 240 Minutes Intravenous Every 8 hours 07/15/19 0825     07/05/19 1700  Ampicillin-Sulbactam (UNASYN) 3 g in sodium chloride 0.9 % 100 mL IVPB  Status:  Discontinued     3 g 200 mL/hr over 30 Minutes Intravenous Every 6 hours 07/05/19 1619 07/10/19 0715   07/01/19 1015  cefoTEtan (CEFOTAN) 2 g in sodium chloride 0.9 % 100 mL IVPB     2 g 200 mL/hr over 30 Minutes Intravenous On call to O.R. 07/01/19 0847 07/01/19 1234   06/27/19 1000  piperacillin-tazobactam (ZOSYN) IVPB 3.375 g  Status:  Discontinued     3.375 g 12.5 mL/hr over 240 Minutes Intravenous Every 8 hours 06/27/19 0807 06/29/19 1119   06/27/19 0200  piperacillin-tazobactam (ZOSYN) IVPB 2.25 g  Status:  Discontinued     2.25 g 100 mL/hr over 30 Minutes Intravenous Every 8 hours 06/26/19 2012 06/27/19 0807   06/26/19 1700  piperacillin-tazobactam (ZOSYN) IVPB 3.375 g     3.375 g 100 mL/hr over 30 Minutes Intravenous  Once 06/26/19 1651 06/26/19 1823      Subjective:  Today, patient was seen and examined at bedside.  Mildly sleepy.  Denies any chest pain, nausea, vomiting.  Nursing reported that he had a couple of bites of breakfast and  lunch.  Objective: Vitals:   07/19/19 0702 07/19/19 1415  BP: 134/76 (!) 106/55  Pulse: (!) 113 95  Resp:  16  Temp: 98.1 F (36.7 C) 98 F (36.7 C)  SpO2:  100%    Intake/Output Summary (Last 24 hours) at 07/19/2019 1422 Last data filed at 07/19/2019 1000 Gross per 24 hour  Intake 2056.58 ml  Output 4 ml  Net 2052.58 ml   Filed Weights   07/16/19 0500 07/17/19 0445 07/18/19 0622  Weight: 69.2 kg 66.8 kg 69 kg   Body mass index is 22.48 kg/m.   Physical Exam: GENERAL: Patient is alert awake and communicative.  Not in obvious distress.   HENT: No scleral pallor or icterus. Pupils equally reactive to light. Oral mucosa is moist NECK: is supple, no gross swelling noted. CHEST: Clear to auscultation. No crackles or wheezes.  Diminished breath sounds bilaterally. CVS: S1 and S2 heard, no murmur. Regular rate and rhythm.  ABDOMEN: Soft, non-tender, bowel sounds are present.  Status post removal of staples.   EXTREMITIES:  No edema.  Right upper extremity PICC line in place. CNS: Cranial nerves are intact. No focal motor deficits. SKIN: warm and dry, surgical incision of the abdomen  Data Review: I have personally reviewed the following laboratory data and studies,  CBC: Recent Labs  Lab 07/14/19 0430 07/15/19 0430 07/16/19 0415 07/18/19 0449 07/19/19 0500  WBC 5.2 5.4 4.5 4.2 5.0  NEUTROABS  --  4.1  --   --   --   HGB 8.1* 8.7* 7.9* 7.9* 8.1*  HCT 25.9* 26.8* 24.6* 24.6* 25.7*  MCV 80.9 80.0 78.3* 79.1* 80.1  PLT 315 342 324 344 [id number]   Basic Metabolic Panel: Recent Labs  Lab 07/14/19 0430 07/14/19 0430 07/15/19 0430 07/16/19 0415 07/17/19 0340 07/18/19 0449 07/19/19 0500  NA 131*   < > 132* 133* 133* 132* 133*  K 4.1   < > 4.2 4.0 4.0 3.8 3.9  CL 106   < > 106 104 103 102 102  CO2 20*   < > 18* 22 23 23 22   GLUCOSE 118*   < > 145* 160* 138* 149* 141*  BUN 22   < > 22 20 24* 24* 22  CREATININE 0.45   < > 0.66 0.60 0.63 0.64 0.64  CALCIUM 8.6*   < > 8.7*  8.6* 8.6* 8.5* 8.8*  MG 1.9  --  1.8  --  1.8 2.1 1.7  PHOS 3.2  --  3.0  --  3.8 3.1 2.8   < > = values in this interval not displayed.   Liver Function Tests: Recent Labs  Lab 07/15/19 0430 07/16/19 0415 07/18/19 0449 07/19/19 0500  AST 60* 59* 70* 77*  ALT 70* 62* 82* 84*  ALKPHOS 305* 301* 346* 382*  BILITOT 0.6 0.6 0.5 0.7  PROT 6.6 6.6 6.8 7.1  ALBUMIN 2.1* 1.9* 2.1* 2.0*   No results for input(s): LIPASE, AMYLASE in the last 168 hours. Recent Labs  Lab 07/13/19 1600  AMMONIA 16   Cardiac Enzymes: No results for input(s): CKTOTAL, CKMB, CKMBINDEX, TROPONINI in the last 168 hours. BNP (last 3 results) No results for input(s): BNP in the last 8760 hours.  ProBNP (last 3 results) No results for input(s): PROBNP in the last 8760 hours.  CBG: Recent Labs  Lab 07/18/19 0516 07/18/19 1230 07/18/19 1821 07/19/19 0104 07/19/19 0546  GLUCAP 140* 152* 157* 143* 136*   Recent Results (from the past 240 hour(s))  Culture, blood (routine x 2)     Status: None (Preliminary result)   Collection Time: 07/15/19  9:08 AM   Specimen: BLOOD  Result Value Ref Range Status   Specimen Description   Final    BLOOD RIGHT ANTECUBITAL Performed at [facility], [place] [address], [place], [place] [address]    Special Requests   Final    BOTTLES DRAWN AEROBIC AND ANAEROBIC Blood Culture adequate volume Performed at [facility] [address] [place], [place] [address]    Culture   Final    NO GROWTH 4 DAYS Performed at [facility], [place] [address], [place], [place] [address]    Report Status PENDING  Incomplete  Culture, blood (routine x 2)     Status: None (Preliminary result)   Collection Time: 07/15/19  9:14 AM   Specimen: BLOOD RIGHT HAND  Result Value Ref Range Status   Specimen Description   Final    BLOOD RIGHT HAND Performed at [facility] [address]  [place], [place] [address]    Special  Requests   Final    BOTTLES DRAWN AEROBIC ONLY Blood Culture adequate volume Performed at [facility] [address], [place], [place] [address]    Culture   Final    NO GROWTH 4 DAYS Performed at [facility], [place] [address] [place], [place] [address]    Report Status PENDING  Incomplete     Studies: DG Swallowing Func-Speech Pathology  Result Date: 07/18/2019 Objective Swallowing Evaluation: Type of Study: MBS-Modified Barium Swallow Study  Patient Details Name: NAKENYA MELLER MRN: [id number] Date of Birth: 07/22/1935 Today's Date: 07/18/2019 Time: SLP Start Time (ACUTE ONLY): 1210 -SLP Stop Time (ACUTE ONLY): 1225 SLP Time Calculation (min) (ACUTE ONLY): 15 min Past Medical History: Past Medical History: Diagnosis Date . Diabetes mellitus without complication ([place])  . High cholesterol  . Hypertension  . Thyroid disease  Past Surgical History: Past Surgical History: Procedure Laterality Date . ABDOMINAL HYSTERECTOMY   . EYE SURGERY Bilateral 06/2016  Cataract . KNEE SURGERY Right  . LAPAROTOMY N/A 08/31/2017  Procedure: EXPLORATORY LAPAROTOMY; BILATERAL SALPINGOOPHERECTOMY; OMENTECTOMY, TUMOR DEBULKING;  Surgeon: Everitt Amber, MD;  Location: [facility];  Service: Gynecology;  Laterality: N/A; . LAPAROTOMY N/A 07/01/2019  Procedure: EXPLORATORY LAPAROTOMY;  Surgeon: Jovita Kussmaul, MD;  Location: [facility];  Service: General;  Laterality: N/A; . MASS EXCISION N/A 08/31/2017  Procedure: RESECTION OF PELVIC MASS;  Surgeon: Everitt Amber, MD;  Location: [facility];  Service: Gynecology;  Laterality: N/A; . RIGHT HEART CATH N/A 02/11/2019  Procedure: RIGHT HEART CATH;  Surgeon: Nigel Mormon, MD;  Location: [facility];  Service: Cardiovascular;  Laterality: N/A; HPI: Pt is an 84 yo female adm to [facility] with SBO s/p OR 2/22.  Pt has had several NG tubes placed but not one currently.  Pt also with h/o pelvic carcinoid tumor that caused the SBO.  Swallow evaluation ordered due to concerns  for dysphagia, DM.  She has been treated with ABX for potential aspiration pna.   Subjective: alert, cooperative Assessment / Plan / Recommendation CHL IP CLINICAL IMPRESSIONS 07/18/2019 Clinical Impression Pt presented with a normal pharyngeal swallow and mild deficits in the oral phase of the swallow.  There was prolonged oral manipulation and decreased cohesion of bolus material  (consistent with confusion, cognitive deficits).  However, material was propelled through pharynx easily.  Airway protection was reliable with consistent laryngeal vestibule closure and no penetration nor aspiration. Multiple trials were executed, and pt was taxed with mixed solid and liquid consistencies in order to test her mechanics - however, she did not aspirate.  Pt did cough throughout the study - this was NOT related to airway invasion.  Recommend allowing dysphagia 3 diet with chopped meats; thin liquids; give meds whole in puree.  Primary obstacle will be pt's motivation to eat and her appetite, but the mechanics of her swallowing are quite good.  D/W Dr. Domingo Cocking and RN.  No further SLP f/u is needed - our service will sign off.  SLP Visit Diagnosis Dysphagia, oral phase (R13.11) Attention and concentration deficit following -- Frontal lobe and executive function deficit following -- Impact on safety and function --   CHL IP TREATMENT RECOMMENDATION 07/18/2019 Treatment Recommendations No treatment recommended at this time   Prognosis 07/09/2019 Prognosis for Safe Diet Advancement Good Barriers to Reach Goals Cognitive deficits Barriers/Prognosis Comment -- CHL IP DIET RECOMMENDATION 07/18/2019 SLP Diet Recommendations Dysphagia 3 (Mech soft) solids;Thin liquid Liquid  Administration via Cup;Straw Medication Administration Whole meds with puree Compensations -- Postural Changes --   CHL IP OTHER RECOMMENDATIONS 07/18/2019 Recommended Consults -- Oral Care Recommendations Oral care BID Other Recommendations --   CHL IP FOLLOW UP  RECOMMENDATIONS 07/18/2019 Follow up Recommendations None   CHL IP FREQUENCY AND DURATION 07/09/2019 Speech Therapy Frequency (ACUTE ONLY) min 2x/week Treatment Duration 1 week      CHL IP ORAL PHASE 07/18/2019 Oral Phase Impaired Oral - Pudding Teaspoon -- Oral - Pudding Cup -- Oral - Honey Teaspoon -- Oral - Honey Cup -- Oral - Nectar Teaspoon -- Oral - Nectar Cup Decreased bolus cohesion Oral - Nectar Straw -- Oral - Thin Teaspoon -- Oral - Thin Cup -- Oral - Thin Straw Decreased bolus cohesion Oral - Puree Decreased bolus cohesion;Delayed oral transit Oral - Mech Soft Decreased bolus cohesion;Delayed oral transit Oral - Regular -- Oral - Multi-Consistency -- Oral - Pill -- Oral Phase - Comment --  CHL IP PHARYNGEAL PHASE 07/18/2019 Pharyngeal Phase WFL Pharyngeal- Pudding Teaspoon -- Pharyngeal -- Pharyngeal- Pudding Cup -- Pharyngeal -- Pharyngeal- Honey Teaspoon -- Pharyngeal -- Pharyngeal- Honey Cup -- Pharyngeal -- Pharyngeal- Nectar Teaspoon -- Pharyngeal -- Pharyngeal- Nectar Cup -- Pharyngeal -- Pharyngeal- Nectar Straw -- Pharyngeal -- Pharyngeal- Thin Teaspoon -- Pharyngeal -- Pharyngeal- Thin Cup -- Pharyngeal -- Pharyngeal- Thin Straw -- Pharyngeal -- Pharyngeal- Puree -- Pharyngeal -- Pharyngeal- Mechanical Soft -- Pharyngeal -- Pharyngeal- Regular -- Pharyngeal -- Pharyngeal- Multi-consistency -- Pharyngeal -- Pharyngeal- Pill -- Pharyngeal -- Pharyngeal Comment --  No flowsheet data found. Juan Quam Laurice 07/18/2019, 12:54 PM                Flora Lipps, MD  [facility] 07/19/2019

## 2019-07-19 NOTE — Progress Notes (Signed)
NUTRITION NOTE  48 hour Calorie Count ordered at this time. Nurse or tech to record percent intake for each meal in the flow sheet and RD will follow-up with this information on Monday, 3/15.     Jarome Matin, MS, RD, LDN, CNSC Inpatient Clinical Dietitian RD pager # available in [place]  After hours/weekend pager # available in [facility]

## 2019-07-19 NOTE — Progress Notes (Signed)
NUTRITION NOTE  Briefly discussed with hospitalist and Palliative Care MD. Diet is now advanced to Dysphagia 3, thin liquids. Plan to cut TPN rate from 85 ml/hr to 40 ml/hr and add megace.   Boost Breeze is currently ordered TID (250 kcal, 9 grams protein each). Will add 2 scoops of beneprotein/meal (25 kcal, 6 grams protein per scoop), and Magic Cup BID (290 kcal, 9 grams protein each).  RD will continue to follow per protocol.     Jarome Matin, MS, RD, LDN, CNSC Inpatient Clinical Dietitian RD pager # available in [place]  After hours/weekend pager # available in [facility]

## 2019-07-19 NOTE — TOC Progression Note (Signed)
Transition of Care [facility]) - Progression Note    Patient Details  Name: Gloria Murphy MRN: [id number] Date of Birth: 1936-01-19  [facility]) CM/SW Contact  Purcell Mouton, RN Phone Number: 07/19/2019, 2:47 PM  Clinical Narrative:    Pt is still not eating. TOC will continue to follow for discharge to SNF.    Expected Discharge Plan: Skilled Nursing Facility Barriers to Discharge: Continued Medical Work up  Expected Discharge Plan and Services Expected Discharge Plan: [place]   Discharge Planning Services: CM Consult   Living arrangements for the past 2 months: Single Family Home                 DME Arranged: 3-N-1, [place] rolling DME Agency: [employer] Date DME Agency Contacted: 07/03/19 Time DME Agency Contacted: [phone] Representative spoke with at DME Agency: Thedore Mins HH Arranged: PT, OT           Social Determinants of Health ([place]) Interventions    Readmission Risk Interventions No flowsheet data found.

## 2019-07-19 NOTE — Progress Notes (Signed)
Daily Progress Note   Patient Name: DALTON EALY       Date: 07/19/2019 DOB: 02/19/1936  Age: 84 y.o. MRN#: [id number] Attending Physician: Flora Lipps, MD Primary Care Physician: Carlyle Basques, MD Admit Date: 06/26/2019  Reason for Consultation/Follow-up: Establishing goals of care  Subjective: I saw and examined [name] today. Her husband was at the bedside.    We discussed plan moving forward to restart Megace and work to taper off of her TPN.  He reports being in agreement and states he is hopeful that her intake will improve.   I then called and discussed with her daughter per her request.  Length of Stay: 23  Current Medications: Scheduled Meds:  . amLODipine  10 mg Oral Daily  . aspirin EC  81 mg Oral Daily  . bisacodyl  10 mg Rectal Daily  . carvedilol  6.25 mg Oral BID WC  . chlorhexidine  15 mL Mouth Rinse BID  . Chlorhexidine Gluconate Cloth  6 each Topical Daily  . enoxaparin (LOVENOX) injection  40 mg Subcutaneous Q24H  . feeding supplement  1 Container Oral TID BM  . insulin aspart  0-15 Units Subcutaneous Q6H  . lip balm  1 application Topical BID  . mouth rinse  15 mL Mouth Rinse q12n4p  . megestrol  400 mg Oral BID  . metoCLOPramide (REGLAN) injection  5 mg Intravenous Q8H  . pantoprazole  40 mg Oral Q1200  . polyethylene glycol  17 g Oral Daily  . polyvinyl alcohol  1 drop Both Eyes TID  . protein supplement  2 Scoop Oral TID WC  . sodium chloride flush  10-40 mL Intracatheter Q12H  . sodium chloride flush  3 mL Intravenous Q12H    Continuous Infusions: . piperacillin-tazobactam (ZOSYN)  IV 3.375 g (07/19/19 1100)  . TPN ADULT (ION) 85 mL/hr at 07/18/19 1718  . TPN ADULT (ION)      PRN Meds: acetaminophen, alum & mag hydroxide-simeth,  labetalol, magic mouthwash, metoprolol tartrate, ondansetron (ZOFRAN) IV, phenol, Resource ThickenUp Clear, sodium chloride flush, sodium chloride flush  Physical Exam     -awake, opens eyes, responds to questions appropriately, chronically-ill appearing -Regular rate and rhythm -diminished bilaterally -follows commands   Vital Signs: BP (!) 106/55 (BP Location: Left Arm)   Pulse 95   Temp  98 F (36.7 C) (Oral)   Resp 16   Ht 5\' 9"  (0000000 m)   Wt 69 kg   SpO2 100%   BMI 22.48 kg/m  SpO2: SpO2: 100 % O2 Device: O2 Device: Room Air O2 Flow Rate: O2 Flow Rate (L/min): 2 L/min  Intake/output summary:   Intake/Output Summary (Last 24 hours) at 07/19/2019 1422 Last data filed at 07/19/2019 1000 Gross per 24 hour  Intake 2056.58 ml  Output 4 ml  Net 2052.58 ml   LBM: Last BM Date: 07/17/19 Baseline Weight: Weight: 66.1 kg Most recent weight: Weight: 69 kg       Palliative Assessment/Data: TPN       Patient Active Problem List   Diagnosis Date Noted  . Pressure injury of skin 07/16/2019  . Dysphagia, oropharyngeal 07/13/2019  . FTT (failure to thrive) in adult   . Severe protein-calorie malnutrition ([place])   . Palliative care encounter   . Sinus tachycardia 06/27/2019  . Type 2 diabetes mellitus without complication, without long-term current use of insulin ([place]) 06/26/2019  . SBO (small bowel obstruction) s/p adhesiolysis 07/01/2019 06/26/2019  . AKI (acute kidney injury) ([place]) 06/26/2019  . Nausea and vomiting 06/26/2019  . Abdominal pain 06/26/2019  . Lactic acidosis 06/26/2019  . Elevated troponin 06/26/2019  . Essential hypertension 02/22/2019  . Exertional dyspnea 02/04/2019  . Vision disturbance 12/19/2018  . TIA (transient ischemic attack) 12/19/2018  . Malignant carcinoid tumor to left adnexa 10/06/2017  . Elevated CA-125 08/31/2017    Palliative Care Assessment & Plan   Recommendations/Plan:  DNR/DNI  Still is very limited in her intake.  Plan to  restart her prior medication of Megace and taper off of TPN.  Discussed with her husband at the bedside and her daughter via phone.  Family understanding and in agreement with plan.  They remain hopeful that her intake will improve at some point.  PMT will continue to support and follow.    Goals of Care and Additional Recommendations:  Limitations on Scope of Treatment: Full Scope Treatment  Code Status:    Code Status Orders  (From admission, onward)         Start     Ordered   07/05/19 0754  Do not attempt resuscitation (DNR)  Continuous    Question Answer Comment  In the event of cardiac or respiratory ARREST Do not call a "code blue"   In the event of cardiac or respiratory ARREST Do not perform Intubation, CPR, defibrillation or ACLS   In the event of cardiac or respiratory ARREST Use medication by any route, position, wound care, and other measures to relive pain and suffering. May use oxygen, suction and manual treatment of airway obstruction as needed for comfort.      07/05/19 0754        Code Status History    Date Active Date Inactive Code Status Order ID Comments User Context   06/26/2019 1957 07/05/2019 0754 Full Code [id number]  Rhetta Mura DO ED   08/31/2017 1650 09/05/2017 1751 Full Code [id number]  Lahoma Crocker, MD Inpatient   Advance Care Planning Activity       Prognosis:   Guarded  Discharge Planning:  To Be Determined  Care plan was discussed with patient's family (daughter Silva Bandy via phone and husband in person at the bedside).   Thank you for allowing the Palliative Medicine Team to assist in the care of this patient.  Time Total: 30 min.   Visit consisted of  counseling and education dealing with the complex and emotionally intense issues of symptom management and palliative care in the setting of serious and potentially life-threatening illness.Greater than 50%  of this time was spent counseling and coordinating care related to the  above assessment and plan.  Micheline Rough, MD [place] Team [phone]  Please contact Palliative Medicine Team phone at [phone] for questions and concerns.

## 2019-07-20 LAB — BASIC METABOLIC PANEL
Anion gap: 9 (ref 5–15)
BUN: 25 mg/dL — ABNORMAL HIGH (ref 8–23)
CO2: 22 mmol/L (ref 22–32)
Calcium: 8.7 mg/dL — ABNORMAL LOW (ref 8.9–10.3)
Chloride: 103 mmol/L (ref 98–111)
Creatinine, Ser: 0.69 mg/dL (ref 0.44–1.00)
GFR calc Af Amer: >60 mL/min (ref >60)
GFR calc non Af Amer: >60 mL/min (ref >60)
Glucose, Bld: 151 mg/dL — ABNORMAL HIGH (ref 70–99)
Potassium: 3.7 mmol/L (ref 3.5–5.1)
Sodium: 134 mmol/L — ABNORMAL LOW (ref 135–145)

## 2019-07-20 LAB — CULTURE, BLOOD (ROUTINE X 2)
Culture: NO GROWTH
Culture: NO GROWTH
Special Requests: ADEQUATE
Special Requests: ADEQUATE

## 2019-07-20 LAB — MAGNESIUM: Magnesium: 2 mg/dL (ref 1.7–2.4)

## 2019-07-20 LAB — PHOSPHORUS: Phosphorus: 3.1 mg/dL (ref 2.5–4.6)

## 2019-07-20 LAB — GLUCOSE, CAPILLARY
Glucose-Capillary: 142 mg/dL — ABNORMAL HIGH (ref 70–99)
Glucose-Capillary: 231 mg/dL — ABNORMAL HIGH (ref 70–99)
Glucose-Capillary: 162 mg/dL — ABNORMAL HIGH (ref 70–99)

## 2019-07-20 MED ORDER — TRAVASOL 10 % IV SOLN
INTRAVENOUS | Status: AC
  Filled 2019-07-20: qty 480

## 2019-07-20 MED ORDER — METOPROLOL TARTRATE 25 MG PO TABS: 25.0000 mg | ORAL_TABLET | Freq: Two times a day (BID) | ORAL | Status: DC

## 2019-07-20 MED ORDER — INSULIN ASPART 100 UNIT/ML ~~LOC~~ SOLN
0.0000 [IU] | Freq: Three times a day (TID) | SUBCUTANEOUS | Status: DC
  Administered 2019-07-20: 3 [IU] via SUBCUTANEOUS
  Administered 2019-07-20: 5 [IU] via SUBCUTANEOUS
  Administered 2019-07-21: 2 [IU] via SUBCUTANEOUS

## 2019-07-20 MED ORDER — GUAIFENESIN 100 MG/5ML PO SOLN
5.0000 mL | ORAL | Status: DC | PRN
  Administered 2019-07-20 – 2019-07-21 (×2): 100 mg via ORAL
  Filled 2019-07-20 (×2): qty 10

## 2019-07-20 NOTE — Progress Notes (Signed)
PHARMACY - TOTAL PARENTERAL NUTRITION CONSULT NOTE   Indication: bowel obstruction, prolonged ileus  Patient Measurements: Height: '5\' 9"'$  (175.3 cm) Weight: 152 lb 3.2 oz (69 kg) IBW/kg (Calculated) : 66.2 TPN AdjBW (KG): 69 Body mass index is 22.48 kg/m.  Assessment: 84 yo woman with hx of pelvic carcinoid tumor, admitted with N/V, decreased PO intake was found to have SBO and gastric distention. Pt had normal swallow exam on 3/11, determined that PEG tube not needed. Weaning TPN; Megace added.  Glucose / Insulin: Hx DM2, diet-controlled; CBGs remain at goal (100-150) - 15 units insulin in TPN, 4 units SSI given in past 24hrs  Electrolytes: stable WNL except Na remains slightly low but stable Renal: SCr WNL/stable; UOP not charted; BUN slightly elevated LFTs / TGs: AST/ALT mildly elevated but stable, Alk phos elevated (382, trending up); TBili stable WNL, TGs stable WNL (3/8) Prealbumin: 16.9 > 12.1 > 7.6 > 7.8 (3/8)  Intake / Output; MIVF: no MIVF; 48 hr calorie count over the weekend although intake remains poor GI Imaging: 2/27 Abdominal CT: findings c/w SBO 3/1 KUB: ileus vs obstruction, interval improvement in small bowel dilatation Surgeries / Procedures: 2/22 exploratory laparotomy (n/a) lysis of adhesions Central access: PICC 2/22, pt pulled PICC out on 2/23 ~ 2100,  re-insert PICC 2/26 and restart TPN TPN start date: 2/22-23, 2/26 >>   Nutritional Goals RD recs (3/8): KCal: 2000-2200 , Protein: 100-115 gm , Fluid: >/= 2 L/day  Goal TPN rate of 85 mL/hr (provides 102 g of protein and 2060 kcals per day) Protein: 50 g/L Lipids 30 g/L Dextrose 15% (GIR 3.18)  Current Nutrition:  Dysphagia III diet, Boost Breeze TID, Beneprotein TID TPN at goal rate  Plan:   At 1800:  Continue TPN at 40 mL/hr (1/2-rate) over the weekend. Hopefully this will also stimulate appetite.  Electrolytes in TPN: Increase Na; no other changes  110 mEq/L of Na, 37mq/L of K, 361m/L of Ca, 7  mEq/L of Mg, and 20 mmol/L of Phos, max Acetate  Add trace elements to TPN daily and standard MVI on MWF only due to national backorder  Continue 8 units regular insulin in TPN with decrease to 1/2 rate  Will relax mSSI to q8 while TPN at 1/2 rate given stable CBGs  IVF per MD, none at the current time  Monitor TPN labs on Mon/Thurs  Follow along for toleration of diet   Leonard Hendler A, PharmD 07/20/19 10:47 AM

## 2019-07-20 NOTE — Progress Notes (Signed)
Daily Progress Note   Patient Name: Gloria Murphy       Date: 07/20/2019 DOB: 29-Apr-1936  Age: 84 y.o. MRN#: [id number] Attending Physician: Flora Lipps, MD Primary Care Physician: Carlyle Basques, MD Admit Date: 06/26/2019  Reason for Consultation/Follow-up: Establishing goals of care  Subjective: I saw and examined Ms. Gloria Murphy today. Her husband was at the bedside.    We discussed restart of Megace and working to taper off of her TPN.    Still no significant PO intake today.  Length of Stay: 24  Current Medications: Scheduled Meds:  . amLODipine  10 mg Oral Daily  . aspirin EC  81 mg Oral Daily  . bisacodyl  10 mg Rectal Daily  . carvedilol  6.25 mg Oral BID WC  . chlorhexidine  15 mL Mouth Rinse BID  . Chlorhexidine Gluconate Cloth  6 each Topical Daily  . enoxaparin (LOVENOX) injection  40 mg Subcutaneous Q24H  . feeding supplement  1 Container Oral TID BM  . insulin aspart  0-15 Units Subcutaneous Q8H  . lip balm  1 application Topical BID  . mouth rinse  15 mL Mouth Rinse q12n4p  . megestrol  400 mg Oral BID  . metoCLOPramide (REGLAN) injection  5 mg Intravenous Q8H  . pantoprazole  40 mg Oral Q1200  . polyethylene glycol  17 g Oral Daily  . polyvinyl alcohol  1 drop Both Eyes TID  . protein supplement  2 Scoop Oral TID WC  . sodium chloride flush  10-40 mL Intracatheter Q12H  . sodium chloride flush  3 mL Intravenous Q12H    Continuous Infusions: . piperacillin-tazobactam (ZOSYN)  IV 12.5 mL/hr at 07/20/19 1812  . TPN ADULT (ION) 40 mL/hr at 07/20/19 1812    PRN Meds: acetaminophen, alum & mag hydroxide-simeth, guaiFENesin, labetalol, magic mouthwash, ondansetron (ZOFRAN) IV, phenol, Resource ThickenUp Clear, sodium chloride flush, sodium chloride  flush  Physical Exam     -awake, opens eyes, responds to questions appropriately, chronically-ill appearing -Regular rate and rhythm -diminished bilaterally -follows commands   Vital Signs: BP 126/64 (BP Location: Left Arm)   Pulse 98   Temp 100.2 F (37.9 C) (Oral)   Resp (!) 30   Ht 5\' 9"  (1.753 m)   Wt 69 kg   SpO2 97%   BMI 22.48 kg/m  SpO2: SpO2: 97 % O2 Device: O2 Device: Room Air O2 Flow Rate: O2 Flow Rate (L/min): 2 L/min  Intake/output summary:   Intake/Output Summary (Last 24 hours) at 07/20/2019 2203 Last data filed at 07/20/2019 2120 Gross per 24 hour  Intake 1314.3 ml  Output 550 ml  Net 764.3 ml   LBM: Last BM Date: 07/19/19 Baseline Weight: Weight: 66.1 kg Most recent weight: Weight: 69 kg       Palliative Assessment/Data: TPN       Patient Active Problem List   Diagnosis Date Noted  . Pressure injury of skin 07/16/2019  . Dysphagia, oropharyngeal 07/13/2019  . FTT (failure to thrive) in adult   . Severe protein-calorie malnutrition ([place])   . Palliative care encounter   . Sinus tachycardia 06/27/2019  . Type 2 diabetes mellitus without complication, without long-term current use of insulin ([place]) 06/26/2019  . SBO (small bowel obstruction) s/p adhesiolysis 07/01/2019 06/26/2019  . AKI (acute kidney injury) ([place]) 06/26/2019  . Nausea and vomiting 06/26/2019  . Abdominal pain 06/26/2019  . Lactic acidosis 06/26/2019  . Elevated troponin 06/26/2019  . Essential hypertension 02/22/2019  . Exertional dyspnea 02/04/2019  . Vision disturbance 12/19/2018  . TIA (transient ischemic attack) 12/19/2018  . Malignant carcinoid tumor to left adnexa 10/06/2017  . Elevated CA-125 08/31/2017    Palliative Care Assessment & Plan   Recommendations/Plan:  DNR/DNI  Still is very limited in her intake.  Continue Megace and taper off of TPN.  Discussed with her husband at the bedside and attempted to call daughter but did not reach her today.  Family  understanding and in agreement with plan.  They remain hopeful that her intake will improve at some point.  PMT will continue to support and follow.    Goals of Care and Additional Recommendations:  Limitations on Scope of Treatment: Full Scope Treatment  Code Status:    Code Status Orders  (From admission, onward)         Start     Ordered   07/05/19 0754  Do not attempt resuscitation (DNR)  Continuous    Question Answer Comment  In the event of cardiac or respiratory ARREST Do not call a "code blue"   In the event of cardiac or respiratory ARREST Do not perform Intubation, CPR, defibrillation or ACLS   In the event of cardiac or respiratory ARREST Use medication by any route, position, wound care, and other measures to relive pain and suffering. May use oxygen, suction and manual treatment of airway obstruction as needed for comfort.      07/05/19 0754        Code Status History    Date Active Date Inactive Code Status Order ID Comments User Context   06/26/2019 1957 07/05/2019 0754 Full Code [id number]  Rhetta Mura DO ED   08/31/2017 1650 09/05/2017 1751 Full Code [id number]  Lahoma Crocker, MD Inpatient   Advance Care Planning Activity       Prognosis:   Guarded  Discharge Planning:  To Be Determined  Care plan was discussed with patient's family (daughter Gloria Murphy via phone and husband in person at the bedside).   Thank you for allowing the Palliative Medicine Team to assist in the care of this patient.  Time Total: 20 min.   Visit consisted of counseling and education dealing with the complex and emotionally intense issues of symptom management and palliative care in the setting of serious and potentially life-threatening illness.Greater than 50%  of this  time was spent counseling and coordinating care related to the above assessment and plan.  Micheline Rough, MD [place] Team [phone]  Please contact Palliative Medicine  Team phone at [phone] for questions and concerns.

## 2019-07-20 NOTE — Progress Notes (Addendum)
PROGRESS NOTE  Gloria Murphy [id number] DOB: 1936-04-07 DOA: 06/26/2019 PCP: Carlyle Basques, MD   LOS: 24 days   Brief narrative: As per HPI,  84 year old woman with history of carcinoid tumor with history of extensive surgery admitted from home on 2/17 due to nausea and vomiting with decreased p.o. intake.She was found to have bowel obstruction due to adhesions. Patient was taken to the OR on 07/01/2019 for lysis of adhesions. Again she ended up having recurrence of small bowel obstruction. She was started on clear liquids on 07/09/2019. Transfer to med telemetry unit 3/3. She has continued to have poor p.o. intake and had worsening encephalopathy throughout hospitalization.   Palliative care was consulted.  Assessment/Plan:  Principal Problem:   SBO (small bowel obstruction) s/p adhesiolysis 07/01/2019 Active Problems:   Malignant carcinoid tumor to left adnexa   Essential hypertension   Type 2 diabetes mellitus without complication, without long-term current use of insulin (HCC)   AKI (acute kidney injury) ([place])   Nausea and vomiting   Abdominal pain   Lactic acidosis   Elevated troponin   Sinus tachycardia   Dysphagia, oropharyngeal   FTT (failure to thrive) in adult   Severe protein-calorie malnutrition ([place])   Palliative care encounter   Pressure injury of skin  SIRS / Acute hypoxic respiratory failure, concern for recurrent aspiration pneumonia.  Completed Unasyn 2/26->3/3 for aspiration pneumonia.  High aspiration risk.  Had rapid response again with possible for aspiration, so antibiotics have been restarted since 07/15/2019.  Currently on Zosyn, will aim for total of  5 days.  Discontinue by tomorrow.  Underwent speech and swallow evaluation on 07/18/2019 with no aspiration.  Recommended dysphagia 3 diet. Has poor appetite.  This has been added to the regimen.  Recurrent SBO s/p exploratory laparotomy with lysis of adhesions (07/01/2019), history of multiple  abdominal surgeries / Failure to thrive    Currently on half dose of TPN for prolonged ileus and poor oral intake.. 30 count has been initiated.  Patient has been started on dysphagia 3 diet.  Holding off PEG tube placement at this time.  Palliative care on board.   Added Megace.  Dietary on board.     AKI secondary to intravascular volume depletion.  Resolved.  Acute metabolic encephalopathy, multifactorial from hyponatremia, hospital-acquired delirium, Medication induced, SIRS, Hypoxia in setting of dementia.  Improved. Continue delirium precaution.  Off morphine, methocarbamol and tramadol.    Essential hypertension On metoprololand labetalol IV at this time.  Coreg as well.  Will discontinue metoprolol IV.  Will closely monitor.  Type 2 diabetes mellitus On TPN.  Continue sliding scale insulin, Accu-Cheks.  Slightly hyperglycemic.  Hyponatremia  mild.  Will closely monitor on TPN.  Sodium of 134 today  Hypokalemia, improved.  Thyroid issues.  Patient's husband says that it was once a day p.o. it appears that it might be   Synthroid for hypothyroidism.  At this time, her TSH is suppressed and T4 is mildly elevated while T3 is normal.  We will hold off with thyroid medications at this time.  Elevated LFTs, possibly from TPN.  Mild elevation.  History of pelvic carcinoid tumor followed by Dr. Denman George.  Had extensive surgery in the past.  Failure to thrive, severe protein calorie malnutrition.  Currently on TPN.  Continue to wean TPN as able.  Dysphagia 3 diet .  Encourage oral intake.  Will closely monitor.  VTE Prophylaxis: Lovenox subcu  Code Status: DO NOT RESUSCITATE  Family Communication: None today.  Spoke with the patient's family including the husband and daughter yesterday.   Disposition Plan:  . Patient is from home . Likely disposition to skilled facility likely in 2 to 3 days if able to take p.o. . Barriers to discharge: On TPN, poor oral intake, initiated on dysphagia  3, add calorie count.  Consultants:  General surgery  Palliative care  Procedures:  PICC line placement and TPN.  NG tube placement  Laboratory laparotomy with lysis of adhesions on 07/01/2019.  Staples removed 07/12/19  Antibiotics:  . Zosyn 07/15/2019>>  Anti-infectives (From admission, onward)   Start     Dose/Rate Route Frequency Ordered Stop   07/15/19 1000  piperacillin-tazobactam (ZOSYN) IVPB 3.375 g     3.375 g 12.5 mL/hr over 240 Minutes Intravenous Every 8 hours 07/15/19 0825     07/05/19 1700  Ampicillin-Sulbactam (UNASYN) 3 g in sodium chloride 0.9 % 100 mL IVPB  Status:  Discontinued     3 g 200 mL/hr over 30 Minutes Intravenous Every 6 hours 07/05/19 1619 07/10/19 0715   07/01/19 1015  cefoTEtan (CEFOTAN) 2 g in sodium chloride 0.9 % 100 mL IVPB     2 g 200 mL/hr over 30 Minutes Intravenous On call to O.R. 07/01/19 0847 07/01/19 1234   06/27/19 1000  piperacillin-tazobactam (ZOSYN) IVPB 3.375 g  Status:  Discontinued     3.375 g 12.5 mL/hr over 240 Minutes Intravenous Every 8 hours 06/27/19 0807 06/29/19 1119   06/27/19 0200  piperacillin-tazobactam (ZOSYN) IVPB 2.25 g  Status:  Discontinued     2.25 g 100 mL/hr over 30 Minutes Intravenous Every 8 hours 06/26/19 2012 06/27/19 0807   06/26/19 1700  piperacillin-tazobactam (ZOSYN) IVPB 3.375 g     3.375 g 100 mL/hr over 30 Minutes Intravenous  Once 06/26/19 1651 06/26/19 1823     Subjective:  Today, patient was seen and examined at bedside.  Mildly sleepy.  Denies any chest pain, nausea, vomiting.  Nursing reported that he had a couple of bites of breakfast and lunch.  Objective: Vitals:   07/19/19 2108 07/20/19 0606  BP: (!) 127/53 128/63  Pulse: (!) 107 97  Resp: (!) 22 (!) 22  Temp: 97.7 F (36.5 C) 99.5 F (37.5 C)  SpO2: 100% 100%    Intake/Output Summary (Last 24 hours) at 07/20/2019 1140 Last data filed at 07/20/2019 1000 Gross per 24 hour  Intake 1530.08 ml  Output --  Net 1530.08 ml    Filed Weights   07/16/19 0500 07/17/19 0445 07/18/19 0622  Weight: 69.2 kg 66.8 kg 69 kg   Body mass index is 22.48 kg/m.   Physical Exam: GENERAL: Patient is alert awake and communicative.  Not in obvious distress.   HENT: No scleral pallor or icterus. Pupils equally reactive to light. Oral mucosa is moist NECK: is supple, no gross swelling noted. CHEST: Clear to auscultation. No crackles or wheezes.  Diminished breath sounds bilaterally. CVS: S1 and S2 heard, no murmur. Regular rate and rhythm.  ABDOMEN: Soft, non-tender, bowel sounds are present.  Status post removal of staples.   EXTREMITIES: No edema.  Right upper extremity PICC line in place. CNS: Cranial nerves are intact. No focal motor deficits. SKIN: warm and dry, surgical incision of the abdomen  Data Review: I have personally reviewed the following laboratory data and studies,  CBC: Recent Labs  Lab 07/14/19 0430 07/15/19 0430 07/16/19 0415 07/18/19 0449 07/19/19 0500  WBC 5.2 5.4 4.5 4.2 5.0  NEUTROABS  --  4.1  --   --   --  HGB 8.1* 8.7* 7.9* 7.9* 8.1*  HCT 25.9* 26.8* 24.6* 24.6* 25.7*  MCV 80.9 80.0 78.3* 79.1* 80.1  PLT 315 342 324 344 [id number]   Basic Metabolic Panel: Recent Labs  Lab 07/15/19 0430 07/15/19 0430 07/16/19 0415 07/17/19 0340 07/18/19 0449 07/19/19 0500 07/20/19 0443  NA 132*   < > 133* 133* 132* 133* 134*  K 4.2   < > 4.0 4.0 3.8 3.9 3.7  CL 106   < > 104 103 102 102 103  CO2 18*   < > 22 23 23 22 22   GLUCOSE 145*   < > 160* 138* 149* 141* 151*  BUN 22   < > 20 24* 24* 22 25*  CREATININE 0.66   < > 0.60 0.63 0.64 0.64 0.69  CALCIUM 8.7*   < > 8.6* 8.6* 8.5* 8.8* 8.7*  MG 1.8  --   --  1.8 2.1 1.7 2.0  PHOS 3.0  --   --  3.8 3.1 2.8 3.1   < > = values in this interval not displayed.   Liver Function Tests: Recent Labs  Lab 07/15/19 0430 07/16/19 0415 07/18/19 0449 07/19/19 0500  AST 60* 59* 70* 77*  ALT 70* 62* 82* 84*  ALKPHOS 305* 301* 346* 382*  BILITOT 0.6 0.6 0.5  0.7  PROT 6.6 6.6 6.8 7.1  ALBUMIN 2.1* 1.9* 2.1* 2.0*   No results for input(s): LIPASE, AMYLASE in the last 168 hours. Recent Labs  Lab 07/13/19 1600  AMMONIA 16   Cardiac Enzymes: No results for input(s): CKTOTAL, CKMB, CKMBINDEX, TROPONINI in the last 168 hours. BNP (last 3 results) No results for input(s): BNP in the last 8760 hours.  ProBNP (last 3 results) No results for input(s): PROBNP in the last 8760 hours.  CBG: Recent Labs  Lab 07/19/19 0104 07/19/19 0546 07/19/19 2333 07/20/19 0608 07/20/19 1138  GLUCAP 143* 136* 127* 142* 231*   Recent Results (from the past 240 hour(s))  Culture, blood (routine x 2)     Status: None (Preliminary result)   Collection Time: 07/15/19  9:08 AM   Specimen: BLOOD  Result Value Ref Range Status   Specimen Description   Final    BLOOD RIGHT ANTECUBITAL Performed at [facility], [place] [address] [place], [place] [address]    Special Requests   Final    BOTTLES DRAWN AEROBIC AND ANAEROBIC Blood Culture adequate volume Performed at [facility] [address], [place], [place] [address]    Culture   Final    NO GROWTH 4 DAYS Performed at [facility], [place] [address], [place], [place] [address]    Report Status PENDING  Incomplete  Culture, blood (routine x 2)     Status: None (Preliminary result)   Collection Time: 07/15/19  9:14 AM   Specimen: BLOOD RIGHT HAND  Result Value Ref Range Status   Specimen Description   Final    BLOOD RIGHT HAND Performed at [facility] [address], [place], [place] [address]    Special Requests   Final    BOTTLES DRAWN AEROBIC ONLY Blood Culture adequate volume Performed at [facility] [address] [place], [place] [address]    Culture   Final    NO GROWTH 4 DAYS Performed at [facility], [place] [address] [place],  [address]    Report Status PENDING  Incomplete     Studies: DG  Swallowing [facility] Pathology  Result Date: 07/18/2019 Objective Swallowing Evaluation: Type of Study: MBS-Modified Barium Swallow Study  Patient Details Name: VINIA MUNCK MRN: [id number] Date of Birth: June 23, 1935 Today's Date: 07/18/2019 Time: SLP Start Time (ACUTE ONLY): 1210 -SLP Stop Time (ACUTE ONLY): 1225 SLP Time Calculation (min) (ACUTE ONLY): 15 min Past Medical History: Past Medical History: Diagnosis Date . Diabetes mellitus without complication ([place])  . High cholesterol  . Hypertension  . Thyroid disease  Past Surgical History: Past Surgical History: Procedure Laterality Date . ABDOMINAL HYSTERECTOMY   . EYE SURGERY Bilateral 06/2016  Cataract . KNEE SURGERY Right  . LAPAROTOMY N/A 08/31/2017  Procedure: EXPLORATORY LAPAROTOMY; BILATERAL SALPINGOOPHERECTOMY; OMENTECTOMY, TUMOR DEBULKING;  Surgeon: Everitt Amber, MD;  Location: [facility];  Service: Gynecology;  Laterality: N/A; . LAPAROTOMY N/A 07/01/2019  Procedure: EXPLORATORY LAPAROTOMY;  Surgeon: Jovita Kussmaul, MD;  Location: [facility];  Service: General;  Laterality: N/A; . MASS EXCISION N/A 08/31/2017  Procedure: RESECTION OF PELVIC MASS;  Surgeon: Everitt Amber, MD;  Location: [facility];  Service: Gynecology;  Laterality: N/A; . RIGHT HEART CATH N/A 02/11/2019  Procedure: RIGHT HEART CATH;  Surgeon: Nigel Mormon, MD;  Location: [facility];  Service: Cardiovascular;  Laterality: N/A; HPI: Pt is an 84 yo female adm to [facility] with SBO s/p OR 2/22.  Pt has had several NG tubes placed but not one currently.  Pt also with h/o pelvic carcinoid tumor that caused the SBO.  Swallow evaluation ordered due to concerns for dysphagia, DM.  She has been treated with ABX for potential aspiration pna.   Subjective: alert, cooperative Assessment / Plan / Recommendation CHL IP CLINICAL IMPRESSIONS 07/18/2019 Clinical Impression Pt presented with a normal pharyngeal swallow and mild deficits in the oral phase of the swallow.  There was prolonged oral manipulation  and decreased cohesion of bolus material  (consistent with confusion, cognitive deficits).  However, material was propelled through pharynx easily.  Airway protection was reliable with consistent laryngeal vestibule closure and no penetration nor aspiration. Multiple trials were executed, and pt was taxed with mixed solid and liquid consistencies in order to test her mechanics - however, she did not aspirate.  Pt did cough throughout the study - this was NOT related to airway invasion.  Recommend allowing dysphagia 3 diet with chopped meats; thin liquids; give meds whole in puree.  Primary obstacle will be pt's motivation to eat and her appetite, but the mechanics of her swallowing are quite good.  D/W Dr. Domingo Cocking and RN.  No further SLP f/u is needed - our service will sign off.  SLP Visit Diagnosis Dysphagia, oral phase (R13.11) Attention and concentration deficit following -- Frontal lobe and executive function deficit following -- Impact on safety and function --   CHL IP TREATMENT RECOMMENDATION 07/18/2019 Treatment Recommendations No treatment recommended at this time   Prognosis 07/09/2019 Prognosis for Safe Diet Advancement Good Barriers to Reach Goals Cognitive deficits Barriers/Prognosis Comment -- CHL IP DIET RECOMMENDATION 07/18/2019 SLP Diet Recommendations Dysphagia 3 (Mech soft) solids;Thin liquid Liquid Administration via Cup;Straw Medication Administration Whole meds with puree Compensations -- Postural Changes --   CHL IP OTHER RECOMMENDATIONS 07/18/2019 Recommended Consults -- Oral Care Recommendations Oral care BID Other Recommendations --   CHL IP FOLLOW UP RECOMMENDATIONS 07/18/2019 Follow up Recommendations None   CHL IP FREQUENCY AND DURATION 07/09/2019 Speech Therapy Frequency (ACUTE ONLY) min 2x/week Treatment Duration 1 week      CHL IP ORAL PHASE 07/18/2019 Oral Phase Impaired Oral - Pudding Teaspoon --  Oral - Pudding Cup -- Oral - Honey Teaspoon -- Oral - Honey Cup -- Oral - Nectar Teaspoon --  Oral - Nectar Cup Decreased bolus cohesion Oral - Nectar Straw -- Oral - Thin Teaspoon -- Oral - Thin Cup -- Oral - Thin Straw Decreased bolus cohesion Oral - Puree Decreased bolus cohesion;Delayed oral transit Oral - Mech Soft Decreased bolus cohesion;Delayed oral transit Oral - Regular -- Oral - Multi-Consistency -- Oral - Pill -- Oral Phase - Comment --  CHL IP PHARYNGEAL PHASE 07/18/2019 Pharyngeal Phase WFL Pharyngeal- Pudding Teaspoon -- Pharyngeal -- Pharyngeal- Pudding Cup -- Pharyngeal -- Pharyngeal- Honey Teaspoon -- Pharyngeal -- Pharyngeal- Honey Cup -- Pharyngeal -- Pharyngeal- Nectar Teaspoon -- Pharyngeal -- Pharyngeal- Nectar Cup -- Pharyngeal -- Pharyngeal- Nectar Straw -- Pharyngeal -- Pharyngeal- Thin Teaspoon -- Pharyngeal -- Pharyngeal- Thin Cup -- Pharyngeal -- Pharyngeal- Thin Straw -- Pharyngeal -- Pharyngeal- Puree -- Pharyngeal -- Pharyngeal- Mechanical Soft -- Pharyngeal -- Pharyngeal- Regular -- Pharyngeal -- Pharyngeal- Multi-consistency -- Pharyngeal -- Pharyngeal- Pill -- Pharyngeal -- Pharyngeal Comment --  No flowsheet data found. Juan Quam Laurice 07/18/2019, 12:54 PM               Flora Lipps, MD  [facility] 07/20/2019

## 2019-07-20 NOTE — Progress Notes (Signed)
Physical Therapy Treatment Patient Details Name: Gloria Murphy MRN: [id number] DOB: [birth year] Today's Date: 07/20/2019    History of Present Illness 84 year old woman admitted from home on 2/17 due to nausea and vomiting with decreased p.o. intake. She has a history of a pelvic carcinoid tumor for which she underwent exploratory laparotomy in April 2019 with bilateral salpingo-oophorectomy and omentectomy as well as partial hysterectomy  In the ED she was found to have a large small bowel obstruction and gastric distention.  NG tube was placed, exploratory laparotomy 2/22.     PT Comments    Pt demonstrating gradual progress with PT. She was able to sit EOB with supervision for 8 minutes and participate with exercises.  Required multimodal cues for technique/sequencing with transfers and for correct exercise form.  Required mod A for bed mobility/transfers. Would require A of 2 for ambulation. Pt fatigues easily.    Follow Up Recommendations  SNF     Equipment Recommendations  None recommended by PT    Recommendations for Other Services       Precautions / Restrictions Precautions Precautions: Fall    Mobility  Bed Mobility Overal bed mobility: Needs Assistance   Rolling: Mod assist   Supine to sit: HOB elevated;Min assist Sit to supine: HOB elevated;Min assist   General bed mobility comments: verbal cues for sequencing UE reaching to bed rail and to bring LE's to EOB, min assist to fully raise trunk upright and to scoot to EOB; min A for return of legs to bed  Transfers Overall transfer level: Needs assistance Equipment used: Rolling walker (2 wheeled) Transfers: Sit to/from Stand;Lateral/Scoot Transfers Sit to Stand: Mod assist        Lateral/Scoot Transfers: Mod assist General transfer comment: Mod A to stand with multimodal cues for safe hand placement and to lean forward.  Pt was unable to pivot or take steps but with mod A was able to lateral scoot toward  [facility]  Ambulation/Gait             General Gait Details: unable to take steps   Stairs             Wheelchair Mobility    Modified Rankin (Stroke Patients Only)       Balance Overall balance assessment: Needs assistance Sitting-balance support: Feet supported;Single extremity supported Sitting balance-Leahy Scale: Fair Sitting balance - Comments: Sat EOB for 8 minutes  with close supervision                                    Cognition Arousal/Alertness: Awake/alert Behavior During Therapy: Flat affect Overall Cognitive Status: No family/caregiver present to determine baseline cognitive functioning Area of Impairment: Following commands;Problem solving                 Orientation Level: Disoriented to;Place;Time     Following Commands: Follows one step commands with increased time;Follows one step commands consistently;Follows multi-step commands inconsistently     Problem Solving: Slow processing;Decreased initiation;Difficulty sequencing;Requires verbal cues;Requires tactile cues        Exercises General Exercises - Lower Extremity Ankle Circles/Pumps: AROM;Both;10 reps;Supine Long Arc Quad: AROM;Both;10 reps;Seated Heel Slides: AAROM;Both;5 reps;Supine Hip ABduction/ADduction: AAROM;Both;5 reps;Supine Other Exercises Other Exercises: towel squeeze (hip add) supine x 10    General Comments General comments (skin integrity, edema, etc.): VSS (c/o dizziness)      Pertinent Vitals/Pain Pain Assessment: Faces Faces Pain Scale: Hurts a  little bit Pain Location: grimacing with movement however did not indicate location of pain Pain Descriptors / Indicators: Guarding;Grimacing Pain Intervention(s): Monitored during session;Repositioned;Relaxation    Home Living                      Prior Function            PT Goals (current goals can now be found in the care plan section) Progress towards PT goals: Progressing  toward goals    Frequency    Min 2X/week      PT Plan Current plan remains appropriate    Co-evaluation              AM-PAC PT "6 Clicks" Mobility   Outcome Measure  Help needed turning from your back to your side while in a flat bed without using bedrails?: A Lot Help needed moving from lying on your back to sitting on the side of a flat bed without using bedrails?: A Lot Help needed moving to and from a bed to a chair (including a wheelchair)?: A Lot Help needed standing up from a chair using your arms (e.g., wheelchair or bedside chair)?: A Lot Help needed to walk in hospital room?: A Lot Help needed climbing 3-5 steps with a railing? : Total 6 Click Score: 11    End of Session Equipment Utilized During Treatment: Gait belt Activity Tolerance: Patient limited by fatigue Patient left: in bed;with call bell/phone within reach;with bed alarm set Nurse Communication: Mobility status PT Visit Diagnosis: Other abnormalities of gait and mobility (R26.89);Muscle weakness (generalized) (M62.81)     Time: [id number] PT Time Calculation (min) (ACUTE ONLY): 21 min  Charges:  $Therapeutic Activity: 8-22 mins                     Maggie Font, PT Acute Rehab Services Pager [phone] [facility] [phone] [facility] [phone]    Gloria Murphy 07/20/2019, 4:07 PM

## 2019-07-21 LAB — GLUCOSE, CAPILLARY
Glucose-Capillary: 123 mg/dL — ABNORMAL HIGH (ref 70–99)
Glucose-Capillary: 137 mg/dL — ABNORMAL HIGH (ref 70–99)
Glucose-Capillary: 135 mg/dL — ABNORMAL HIGH (ref 70–99)

## 2019-07-21 MED ORDER — TRAVASOL 10 % IV SOLN
INTRAVENOUS | Status: AC
  Filled 2019-07-21: qty 480

## 2019-07-21 MED ORDER — INSULIN ASPART 100 UNIT/ML ~~LOC~~ SOLN
0.0000 [IU] | Freq: Four times a day (QID) | SUBCUTANEOUS | Status: DC
  Administered 2019-07-21 (×2): 2 [IU] via SUBCUTANEOUS
  Administered 2019-07-22: 3 [IU] via SUBCUTANEOUS
  Administered 2019-07-22 – 2019-07-23 (×3): 2 [IU] via SUBCUTANEOUS

## 2019-07-21 NOTE — Progress Notes (Addendum)
PHARMACY - TOTAL PARENTERAL NUTRITION CONSULT NOTE   Indication: bowel obstruction, prolonged ileus  Patient Measurements: Height: '5\' 9"'$  (175.3 cm) Weight: 145 lb 6.4 oz (66 kg) IBW/kg (Calculated) : 66.2 TPN AdjBW (KG): 69 Body mass index is 21.47 kg/m.  Assessment: 84 yo woman with hx of pelvic carcinoid tumor, admitted with N/V, decreased PO intake was found to have SBO and gastric distention. Pt had normal swallow exam on 3/11, determined that PEG tube not needed. Weaning TPN; Megace added.  Glucose / Insulin: Hx DM2, diet-controlled; a  few high CBGs yesterday but improved now (range 123-231); 8 units insulin in TPN, 10 units SSI given in past 24hrs  Electrolytes: no new labs, stable yesterday except Na borderline low Renal: no new labs; previously WNL/stable; UOP incompletely charted LFTs / TGs: 3/12 - AST/ALT mildly elevated but stable, Alk phos elevated (382, trending up); TBili stable WNL, 3/8 - TGs stable WNL Prealbumin: 16.9 > 12.1 > 7.6 > 7.8 (3/8)  Intake / Output; MIVF: no MIVF; 48 hr calorie count over the weekend although intake remains poor GI Imaging: 2/27 Abdominal CT: findings c/w SBO 3/1 KUB: ileus vs obstruction, interval improvement in small bowel dilatation Surgeries / Procedures: 2/22 exploratory laparotomy (n/a) lysis of adhesions Central access: PICC 2/22, pt pulled PICC out on 2/23 ~ 2100,  re-insert PICC 2/26 and restart TPN TPN start date: 2/22-23, 2/26 >>   Nutritional Goals RD recs (3/8): KCal: 2000-2200 , Protein: 100-115 gm , Fluid: >/= 2 L/day  Goal TPN rate of 85 mL/hr (provides 102 g of protein and 2060 kcals per day) Protein: 50 g/L Lipids 30 g/L Dextrose 15% (GIR 3.18)  Current Nutrition:  Dysphagia III diet, Boost Breeze TID, Beneprotein TID TPN at goal rate  Plan:   At 1800:  Continue TPN at 40 mL/hr (1/2-rate) over the weekend. Hopefully this will also stimulate appetite.  Electrolytes in TPN: no changes  110 mEq/L of Na, 74mq/L  of K, 363m/L of Ca, 7 mEq/L of Mg, and 20 mmol/L of Phos, max Acetate  Add trace elements to TPN daily and standard MVI on MWF only due to national backorder  Increase to 10 units regular insulin in TPN  Change mSSI back to q6 to maintain CBGs at goal  IVF per MD, none at the current time  Monitor TPN labs on Mon/Thurs  Follow along for toleration of diet   Alainah Phang A, PharmD 07/21/19 10:17 AM

## 2019-07-21 NOTE — Progress Notes (Signed)
PROGRESS NOTE  Gloria Murphy [id number] DOB: 07/20/1935 DOA: 06/26/2019 PCP: Carlyle Basques, MD   LOS: 25 days   Brief narrative: As per HPI,  84 year old woman with history of carcinoid tumor with history of extensive surgery admitted from home on 2/17 due to nausea and vomiting with decreased p.o. intake.She was found to have bowel obstruction due to adhesions. Patient was taken to the OR on 07/01/2019 for lysis of adhesions. Again, she ended up having recurrence of small bowel obstruction. She was started on clear liquids on 07/09/2019. Transfer to med telemetry unit 3/3. She has continued to have poor p.o. intake and had worsening encephalopathy throughout hospitalization.   Palliative care was consulted.  Assessment/Plan:  Principal Problem:   SBO (small bowel obstruction) s/p adhesiolysis 07/01/2019 Active Problems:   Malignant carcinoid tumor to left adnexa   Essential hypertension   Type 2 diabetes mellitus without complication, without long-term current use of insulin (HCC)   AKI (acute kidney injury) ([place])   Nausea and vomiting   Abdominal pain   Lactic acidosis   Elevated troponin   Sinus tachycardia   Dysphagia, oropharyngeal   FTT (failure to thrive) in adult   Severe protein-calorie malnutrition ([place])   Palliative care encounter   Pressure injury of skin  SIRS / Acute hypoxic respiratory failure, concern for recurrent aspiration pneumonia.  Mild new fever Completed Unasyn 2/26->3/3 for aspiration pneumonia.  Had recurrent aspiration and was started on Zosyn on 07/15/2019.    Patient underwent speech and swallow evaluation on 07/18/2019 with no aspiration.  Patient has had a mild grade fever of 100.2 F.  We will continue Zosyn for now.  Recommended dysphagia 3 diet. Has poor appetite.  Megace has been added to the regimen to stimulate the appetite.  Recurrent SBO s/p exploratory laparotomy with lysis of adhesions (07/01/2019), history of multiple abdominal  surgeries / Failure to thrive  Currently on half dose of TPN, aim is to continue to wean TPN as possible.  Calorie count ongoing.  Patient has been started on dysphagia 3 diet.  Holding off PEG tube placement at this time.  Palliative care on board.   Added Megace.  Dietary on board.     AKI secondary to intravascular volume depletion.  Resolved.  Acute metabolic encephalopathy, multifactorial from hyponatremia, hospital-acquired delirium, Medication induced, SIRS, Hypoxia in setting of dementia.  Improved. Continue delirium precaution.  Off morphine, methocarbamol and tramadol.    Essential hypertension On  labetalol IV, continue Coreg as well.  Overall controlled.  Type 2 diabetes mellitus On TPN.  Continue sliding scale insulin, Accu-Cheks.  improved today.  Hyponatremia  mild.  Will closely monitor  Hypokalemia, improved.  Potassium 3.7.  Thyroid issues. TSH is suppressed and T4 is mildly elevated while T3 is normal.  We will hold off with thyroid medications at this time.  Elevated LFTs, possibly from TPN.  Mild elevation.  History of pelvic carcinoid tumor followed by Dr. Denman George.  Had extensive surgery in the past.  Failure to thrive, severe protein calorie malnutrition.  Currently on decreased dose of TPN.  Continue to wean TPN as able.  Dysphagia 3 diet .  Encourage oral intake.  Will closely monitor.  Calorie count under process  VTE Prophylaxis: Lovenox subcu  Code Status: DO NOT RESUSCITATE  Family Communication: None today.    Disposition Plan:  . Patient is from home . Likely disposition to skilled facility likely in 2 to 3 days if able to take p.o. . Barriers  to discharge: On TPN, poor oral intake, initiated on dysphagia 3, add calorie count, mild fever  Consultants:  General surgery  Palliative care  Procedures:  PICC line placement and TPN.  NG tube placement  Laboratory laparotomy with lysis of adhesions on 07/01/2019.  Staples removed  07/12/19  Antibiotics:  . Zosyn 07/15/2019>>  Anti-infectives (From admission, onward)   Start     Dose/Rate Route Frequency Ordered Stop   07/15/19 1000  piperacillin-tazobactam (ZOSYN) IVPB 3.375 g     3.375 g 12.5 mL/hr over 240 Minutes Intravenous Every 8 hours 07/15/19 0825     07/05/19 1700  Ampicillin-Sulbactam (UNASYN) 3 g in sodium chloride 0.9 % 100 mL IVPB  Status:  Discontinued     3 g 200 mL/hr over 30 Minutes Intravenous Every 6 hours 07/05/19 1619 07/10/19 0715   07/01/19 1015  cefoTEtan (CEFOTAN) 2 g in sodium chloride 0.9 % 100 mL IVPB     2 g 200 mL/hr over 30 Minutes Intravenous On call to O.R. 07/01/19 0847 07/01/19 1234   06/27/19 1000  piperacillin-tazobactam (ZOSYN) IVPB 3.375 g  Status:  Discontinued     3.375 g 12.5 mL/hr over 240 Minutes Intravenous Every 8 hours 06/27/19 0807 06/29/19 1119   06/27/19 0200  piperacillin-tazobactam (ZOSYN) IVPB 2.25 g  Status:  Discontinued     2.25 g 100 mL/hr over 30 Minutes Intravenous Every 8 hours 06/26/19 2012 06/27/19 0807   06/26/19 1700  piperacillin-tazobactam (ZOSYN) IVPB 3.375 g     3.375 g 100 mL/hr over 30 Minutes Intravenous  Once 06/26/19 1651 06/26/19 1823     Subjective:  Today, patient was seen and examined at bedside.  Patient denies interval complaints.  Denies any shortness of breath, cough, pain but had mild fever.    Objective: Vitals:   07/21/19 0907 07/21/19 1145  BP: (!) 108/57 122/65  Pulse: 95 98  Resp: (!) 33 (!) 30  Temp: (!) 97.4 F (36.3 C) 98 F (36.7 C)  SpO2: 99% 97%    Intake/Output Summary (Last 24 hours) at 07/21/2019 1207 Last data filed at 07/21/2019 [id number] Gross per 24 hour  Intake 1176.73 ml  Output 700 ml  Net 476.73 ml   Filed Weights   07/17/19 0445 07/18/19 0622 07/21/19 0530  Weight: 66.8 kg 69 kg 66 kg   Body mass index is 21.47 kg/m.   Physical Exam:  General: Thinly built, not in obvious distress, alert awake communicative HENT: Normocephalic, pupils  equally reacting to light and accommodation.  No scleral pallor or icterus noted. Oral mucosa is moist.  Chest:  Clear breath sounds.  Diminished breath sounds bilaterally. No crackles or wheezes.  CVS: S1 &S2 heard. No murmur.  Regular rate and rhythm. Abdomen: Soft, nontender, nondistended.  Bowel sounds are heard.  Status post removal of staples.   Extremities: Right upper extremity PICC line in place.   Psych: Alert, awake and communicative  CNS:  No cranial nerve deficits.  Power equal in all extremities.  No sensory deficits noted.  No cerebellar signs.   Skin: Warm and dry.  Surgical incision of the abdomen    Data Review: I have personally reviewed the following laboratory data and studies,  CBC: Recent Labs  Lab 07/15/19 0430 07/16/19 0415 07/18/19 0449 07/19/19 0500  WBC 5.4 4.5 4.2 5.0  NEUTROABS 4.1  --   --   --   HGB 8.7* 7.9* 7.9* 8.1*  HCT 26.8* 24.6* 24.6* 25.7*  MCV 80.0 78.3* 79.1* 80.1  PLT 342 324 344 [id number]   Basic Metabolic Panel: Recent Labs  Lab 07/15/19 0430 07/15/19 0430 07/16/19 0415 07/17/19 0340 07/18/19 0449 07/19/19 0500 07/20/19 0443  NA 132*   < > 133* 133* 132* 133* 134*  K 4.2   < > 4.0 4.0 3.8 3.9 3.7  CL 106   < > 104 103 102 102 103  CO2 18*   < > 22 23 23 22 22   GLUCOSE 145*   < > 160* 138* 149* 141* 151*  BUN 22   < > 20 24* 24* 22 25*  CREATININE 0.66   < > 0.60 0.63 0.64 0.64 0.69  CALCIUM 8.7*   < > 8.6* 8.6* 8.5* 8.8* 8.7*  MG 1.8  --   --  1.8 2.1 1.7 2.0  PHOS 3.0  --   --  3.8 3.1 2.8 3.1   < > = values in this interval not displayed.   Liver Function Tests: Recent Labs  Lab 07/15/19 0430 07/16/19 0415 07/18/19 0449 07/19/19 0500  AST 60* 59* 70* 77*  ALT 70* 62* 82* 84*  ALKPHOS 305* 301* 346* 382*  BILITOT 0.6 0.6 0.5 0.7  PROT 6.6 6.6 6.8 7.1  ALBUMIN 2.1* 1.9* 2.1* 2.0*   No results for input(s): LIPASE, AMYLASE in the last 168 hours. No results for input(s): AMMONIA in the last 168 hours. Cardiac  Enzymes: No results for input(s): CKTOTAL, CKMB, CKMBINDEX, TROPONINI in the last 168 hours. BNP (last 3 results) No results for input(s): BNP in the last 8760 hours.  ProBNP (last 3 results) No results for input(s): PROBNP in the last 8760 hours.  CBG: Recent Labs  Lab 07/20/19 0608 07/20/19 1138 07/20/19 2219 07/21/19 0526 07/21/19 1130  GLUCAP 142* 231* 162* 123* 137*   Recent Results (from the past 240 hour(s))  Culture, blood (routine x 2)     Status: None   Collection Time: 07/15/19  9:08 AM   Specimen: BLOOD  Result Value Ref Range Status   Specimen Description   Final    BLOOD RIGHT ANTECUBITAL Performed at [facility], [place] [address] [place], [place] [address]    Special Requests   Final    BOTTLES DRAWN AEROBIC AND ANAEROBIC Blood Culture adequate volume Performed at [facility] [address] [place], [place] [address]    Culture   Final    NO GROWTH 5 DAYS Performed at [facility], [place] [address], [place], [place] [address]    Report Status 07/20/2019 FINAL  Final  Culture, blood (routine x 2)     Status: None   Collection Time: 07/15/19  9:14 AM   Specimen: BLOOD RIGHT HAND  Result Value Ref Range Status   Specimen Description   Final    BLOOD RIGHT HAND Performed at [facility] [address] [place], [place] [address]    Special Requests   Final    BOTTLES DRAWN AEROBIC ONLY Blood Culture adequate volume Performed at [facility] [address], [place], [place] [address]    Culture   Final    NO GROWTH 5 DAYS Performed at [facility], [place] [address] [place], [place] [address]    Report Status 07/20/2019 FINAL  Final     Studies: No results found.  Flora Lipps, MD  [facility] 07/21/2019

## 2019-07-21 NOTE — Progress Notes (Addendum)
Pharmacy Antibiotic Note  Gloria Murphy is a 84 y.o. female admitted on 06/26/2019 with asp PNA.  Pharmacy has been consulted for piperacillin/tazobactam dosing.  Pt was previously on ampicillin/sulbactam from 2/26 - 3/3 at which time ABX were stopped. Piperacillin/tazobactam initiated on 3/8 when patient was febrile.  Today, 07/21/19 -WBC WNL -SCr WNL, stable. CrCl > 50 mL/min -Afebrile  Day 7 piperacillin/tazobactam  Plan:  Continue piperacillin/tazobactam 3.375 g IV q8h EI  Height: 5\' 9"  (175.3 cm) Weight: 145 lb 6.4 oz (66 kg) IBW/kg (Calculated) : 66.2  Temp (24hrs), Avg:98.8 F (37.1 C), Min:97.4 F (36.3 C), Max:100.2 F (37.9 C)  Recent Labs  Lab 07/15/19 0430 07/15/19 0430 07/15/19 0908 07/15/19 1304 07/16/19 0415 07/17/19 0340 07/18/19 0449 07/19/19 0500 07/20/19 0443  WBC 5.4  --   --   --  4.5  --  4.2 5.0  --   CREATININE 0.66   < >  --   --  0.60 0.63 0.64 0.64 0.69  LATICACIDVEN  --   --  1.1 1.1  --   --   --   --   --    < > = values in this interval not displayed.    Estimated Creatinine Clearance: 57.5 mL/min (by C-G formula based on SCr of 0.69 mg/dL).    Antimicrobials this admission: 2/26 Unasyn>> 3/3 3/8 Zosyn >>  Dose adjustments this admission: n/a  Microbiology results: 2/26: BCx: ngf 2/26 MRSA PCR: negative 2/28 BCx2: NGF   No Known Allergies  Noboru Bidinger A, PharmD 07/21/2019 10:39 AM

## 2019-07-21 NOTE — Progress Notes (Signed)
For breakfast, patient consumed: 175 calories (1/2 banana, 1/2 oatmeal, OJ)  For lunch patient patient consumed basically 0 calories (took maybe 1 bite, but nothing measurable).  For supper, patient consumed around 80 calories (1/3 [place] with gravy, 1/8 mashed potatoes, 1/3 dinner roll).  Patient did not actually drink her AM ordered powder protein and refused the others for this shift. Patient has had 3 loose, pasty, tan colored BMs this shift also.

## 2019-07-22 LAB — MAGNESIUM
Magnesium: 1.9 mg/dL (ref 1.7–2.4)
Magnesium: 1.6 mg/dL — ABNORMAL LOW (ref 1.7–2.4)

## 2019-07-22 LAB — COMPREHENSIVE METABOLIC PANEL
ALT: 63 U/L — ABNORMAL HIGH (ref 0–44)
ALT: 63 U/L — ABNORMAL HIGH (ref 0–44)
AST: 55 U/L — ABNORMAL HIGH (ref 15–41)
AST: 51 U/L — ABNORMAL HIGH (ref 15–41)
Albumin: 1.9 g/dL — ABNORMAL LOW (ref 3.5–5.0)
Albumin: 2 g/dL — ABNORMAL LOW (ref 3.5–5.0)
Alkaline Phosphatase: 320 U/L — ABNORMAL HIGH (ref 38–126)
Alkaline Phosphatase: 322 U/L — ABNORMAL HIGH (ref 38–126)
Anion gap: 8 (ref 5–15)
Anion gap: 8 (ref 5–15)
BUN: 23 mg/dL (ref 8–23)
BUN: 21 mg/dL (ref 8–23)
CO2: 22 mmol/L (ref 22–32)
CO2: 21 mmol/L — ABNORMAL LOW (ref 22–32)
Calcium: 8.5 mg/dL — ABNORMAL LOW (ref 8.9–10.3)
Calcium: 8.6 mg/dL — ABNORMAL LOW (ref 8.9–10.3)
Chloride: 105 mmol/L (ref 98–111)
Chloride: 106 mmol/L (ref 98–111)
Creatinine, Ser: 0.58 mg/dL (ref 0.44–1.00)
Creatinine, Ser: 0.62 mg/dL (ref 0.44–1.00)
GFR calc Af Amer: >60 mL/min (ref >60)
GFR calc Af Amer: >60 mL/min (ref >60)
GFR calc non Af Amer: >60 mL/min (ref >60)
GFR calc non Af Amer: >60 mL/min (ref >60)
Glucose, Bld: 103 mg/dL — ABNORMAL HIGH (ref 70–99)
Glucose, Bld: 115 mg/dL — ABNORMAL HIGH (ref 70–99)
Potassium: 3.5 mmol/L (ref 3.5–5.1)
Potassium: 3.6 mmol/L (ref 3.5–5.1)
Sodium: 135 mmol/L (ref 135–145)
Sodium: 135 mmol/L (ref 135–145)
Total Bilirubin: 0.5 mg/dL (ref 0.3–1.2)
Total Bilirubin: 0.5 mg/dL (ref 0.3–1.2)
Total Protein: 7 g/dL (ref 6.5–8.1)
Total Protein: 7.1 g/dL (ref 6.5–8.1)

## 2019-07-22 LAB — CBC WITH DIFFERENTIAL/PLATELET
Abs Immature Granulocytes: 0.2 10*3/uL — ABNORMAL HIGH (ref 0.00–0.07)
Basophils Absolute: 0 10*3/uL (ref 0.0–0.1)
Basophils Relative: 1 %
Eosinophils Absolute: 0.2 10*3/uL (ref 0.0–0.5)
Eosinophils Relative: 5 %
HCT: 24.6 % — ABNORMAL LOW (ref 36.0–46.0)
Hemoglobin: 7.7 g/dL — ABNORMAL LOW (ref 12.0–15.0)
Immature Granulocytes: 5 %
Lymphocytes Relative: 16 %
Lymphs Abs: 0.7 10*3/uL (ref 0.7–4.0)
MCH: 24.9 pg — ABNORMAL LOW (ref 26.0–34.0)
MCHC: 31.3 g/dL (ref 30.0–36.0)
MCV: 79.6 fL — ABNORMAL LOW (ref 80.0–100.0)
Monocytes Absolute: 0.6 10*3/uL (ref 0.1–1.0)
Monocytes Relative: 13 %
Neutro Abs: 2.6 10*3/uL (ref 1.7–7.7)
Neutrophils Relative %: 60 %
Platelets: 326 10*3/uL (ref 150–400)
RBC: 3.09 MIL/uL — ABNORMAL LOW (ref 3.87–5.11)
RDW: 16.7 % — ABNORMAL HIGH (ref 11.5–15.5)
WBC: 4.4 10*3/uL (ref 4.0–10.5)
nRBC: 0.5 % — ABNORMAL HIGH (ref 0.0–0.2)

## 2019-07-22 LAB — PHOSPHORUS
Phosphorus: 2.8 mg/dL (ref 2.5–4.6)
Phosphorus: 2.8 mg/dL (ref 2.5–4.6)

## 2019-07-22 LAB — GLUCOSE, CAPILLARY
Glucose-Capillary: 111 mg/dL — ABNORMAL HIGH (ref 70–99)
Glucose-Capillary: 133 mg/dL — ABNORMAL HIGH (ref 70–99)
Glucose-Capillary: 137 mg/dL — ABNORMAL HIGH (ref 70–99)
Glucose-Capillary: 170 mg/dL — ABNORMAL HIGH (ref 70–99)

## 2019-07-22 LAB — TRIGLYCERIDES
Triglycerides: 66 mg/dL (ref <150)
Triglycerides: 75 mg/dL (ref <150)

## 2019-07-22 LAB — PREALBUMIN: Prealbumin: 10.2 mg/dL — ABNORMAL LOW (ref 18–38)

## 2019-07-22 MED ORDER — MAGNESIUM SULFATE 2 GM/50ML IV SOLN
2.0000 g | Freq: Once | INTRAVENOUS | Status: AC
  Administered 2019-07-22: 14:00:00 2 g via INTRAVENOUS
  Filled 2019-07-22: qty 50

## 2019-07-22 MED ORDER — MAGNESIUM OXIDE 400 (241.3 MG) MG PO TABS
400.0000 mg | ORAL_TABLET | Freq: Two times a day (BID) | ORAL | Status: DC
  Administered 2019-07-22 – 2019-07-23 (×2): 400 mg via ORAL
  Filled 2019-07-22 (×2): qty 1

## 2019-07-22 NOTE — Plan of Care (Signed)
  Problem: Health Behavior/Discharge Planning: Goal: Ability to manage health-related needs will improve Outcome: Progressing   Problem: Clinical Measurements: Goal: Will remain free from infection Outcome: Progressing   

## 2019-07-22 NOTE — Progress Notes (Signed)
Nutrition Follow-up  DOCUMENTATION CODES:   Not applicable  INTERVENTION:  - continue Boost Breeze TID, 2 scoops Beneprotein TID, and Magic Cup BID. - continue to encourage PO intakes. - will follow-up for Calorie Count 3/15.   NUTRITION DIAGNOSIS:   Increased nutrient needs related to acute illness, post-op healing as evidenced by estimated needs. -ongoing  GOAL:   Patient will meet greater than or equal to 90% of their needs -unmet  MONITOR:   PO intake, Supplement acceptance, Labs, Weight trends, Other (Comment)(TPN regimen)  ASSESSMENT:   84 year old female admitted from home on 2/17 due to N/V and decreased p.o. intake. In the ED she was found to have a SBO and gastric distention. She was seen by surgery in the ED and NG tube was placed. She has a history of a pelvic carcinoid tumor for which she underwent ex lap in 08/2017 with bilateral salpingo-oophorectomy and omentectomy as well as partial hysterectomy.  Significant Events: 2/17- admission; NGT placed 2/18- initial RD assessment 2/19- NGT replaced and removed 2/20- NGT replaced x3 and removed x2 2/22- ex lap with LOAs; TPN initiation 2/23- diet advanced from NPO to CLD; NGT removed; patient confused and pulled PICC 2/24- diet advanced from CLD to [place] 2/25- double lumen PICC placed in R brachial; downgraded from FLD to CLD 2/26- downgraded from CLD to NPO; NGT placed  2/28- NGT removed; advanced from NPO to CLD and then back to NPO 3/2- advanced from NPO to CLD - abdominal staples removed  3/6- advanced from CLD to Dysphagia 1, nectar-thick 3/8- diet changed to NPO; newly documented stage 2 pressure injury to coccyx 3/10- diet advanced from NPO to Dysphagia 1, thin liquids 3/11- diet advanced to Dysphagia 3, thin liquids 3/12- TPN rate decreased from 85 ml/hr to 40 ml/hr; megace added BID; Calorie Count ordered    Patient is a/o to self and place. Weight mainly stable since 3/9 and is consistent with admission  (2/17) weight. She continues to receive TPN at 40 ml/hr. On 3/13, 3/14, and this AM she has accepted 5 of 7 cartons of Boost Breeze and 4 of 7 doses of Beneprotein. Unsure of how much was actually consumed when patient accepted supplements. Patient consumed the following percentages over the weekend:  3/13- 10% of breakfast, 0% of lunch, 10% of dinner (total of 147 kcal, 5 grams protein) 3/14- 25% of breakfast, 0% of lunch, 30% of dinner (total of 357 kcal, 17 grams protein)  Per notes: - recurrent aspiration PNA and started on zosyn 3/8 - recurrent SBO s/p ex lap and LOAs on 2/22 - Palliative Care following--holding off on PEG placement - TSH suppressed and T4 mildly elevated, T3 is normal - elevated LFTs--possibly 2/2 TPN - FTT, severe protein calorie malnutrition--TPN, poor oral intakes with megace added BID - likely plan for SNF at the time of d/c   Labs reviewed; CBGs: 133 and 111 mg/dl, Ca: 8.6 mg/dl, Mg: 1.6 mg/dl, Alk Phos elevated, LFTs slightly elevated, triglycerides: 75 mg/dl (3/15). Medications reviewed; 10 mg rectal dulcolax/day, sliding scale novolog, 400 mg megace BID, 5 mg reglan TID, 40 mg oral protonix/day, 1 packet miralax/day.   Diet Order:   Diet Order            DIET DYS 3 Room service appropriate? Yes; Fluid consistency: Thin  Diet effective now              EDUCATION NEEDS:   No education needs have been identified at this time  Skin:  Skin Assessment: Skin Integrity Issues: Skin Integrity Issues:: Stage II, Incisions Stage II: coccyx (new documentation 3/8) Incisions: abdomen (2/22)  Last BM:  3/14  Height:   Ht Readings from Last 1 Encounters:  07/08/19 '5\' 9"'$  (1.753 m)    Weight:   Wt Readings from Last 1 Encounters:  07/22/19 66.3 kg    Ideal Body Weight:  65.9 kg  BMI:  Body mass index is 21.58 kg/m.  Estimated Nutritional Needs:   Kcal:  2000-2200 kcal  Protein:  100-115 grams  Fluid:  >/= 2 L/day     Jarome Matin,  MS, RD, LDN, CNSC Inpatient Clinical Dietitian RD pager # available in [facility]  After hours/weekend pager # available in [facility]

## 2019-07-22 NOTE — Progress Notes (Addendum)
Nutritional Update:   Breakfast:  1/2 of one pancake (6-8 bites)  3 bites applesauce with meds 3/4 cup Fruit juice mix with Beneprotein 2 bites of Cream of wheat.  NO Resource Breeze "too full"   Lunch:  3 bites of meatloaf 4 teaspoon of vegetable soup 3 sips of tea with Beneprotein 4 sips of [employer] No macaroni  No broccoli  No milk No magic cup Refused to eat, turning her head away.   Snack: (Spouse at bedside to assist) 120cc Beneprotein in [employer]:  Husband fed patient and she ate more with spouse assistance 1/2 cup greenbeans 3 bites chopped chicken breast 1/2 cup Magic cup  1/2 fruit cup Sips of Tea  SRP, RN

## 2019-07-22 NOTE — Progress Notes (Signed)
Daily Progress Note   Patient Name: Gloria Murphy       Date: 07/22/2019 DOB: 1935/10/31  Age: 84 y.o. MRN#: [id number] Attending Physician: Flora Lipps, MD Primary Care Physician: Carlyle Basques, MD Admit Date: 06/26/2019  Reason for Consultation/Follow-up: Establishing goals of care  Subjective: I saw and examined Ms. Gloria Murphy today. Her husband was not at the bedside at time of my visit.    Discussed working to taper off of her TPN.  Some improvement in intake today.  I called and updated her daughter per her request.  Length of Stay: 26  Current Medications: Scheduled Meds:  . amLODipine  10 mg Oral Daily  . aspirin EC  81 mg Oral Daily  . bisacodyl  10 mg Rectal Daily  . carvedilol  6.25 mg Oral BID WC  . chlorhexidine  15 mL Mouth Rinse BID  . Chlorhexidine Gluconate Cloth  6 each Topical Daily  . enoxaparin (LOVENOX) injection  40 mg Subcutaneous Q24H  . feeding supplement  1 Container Oral TID BM  . insulin aspart  0-15 Units Subcutaneous Q6H  . lip balm  1 application Topical BID  . magnesium oxide  400 mg Oral BID  . mouth rinse  15 mL Mouth Rinse q12n4p  . megestrol  400 mg Oral BID  . metoCLOPramide (REGLAN) injection  5 mg Intravenous Q8H  . pantoprazole  40 mg Oral Q1200  . polyethylene glycol  17 g Oral Daily  . polyvinyl alcohol  1 drop Both Eyes TID  . protein supplement  2 Scoop Oral TID WC  . sodium chloride flush  10-40 mL Intracatheter Q12H  . sodium chloride flush  3 mL Intravenous Q12H    Continuous Infusions:   PRN Meds: acetaminophen, alum & mag hydroxide-simeth, guaiFENesin, labetalol, magic mouthwash, ondansetron (ZOFRAN) IV, phenol, Resource ThickenUp Clear, sodium chloride flush, sodium chloride flush  Physical Exam     -awake, opens  eyes, responds to questions appropriately, chronically-ill appearing -Regular rate and rhythm -diminished bilaterally -follows commands   Vital Signs: BP 115/64 (BP Location: Left Arm)   Pulse 99   Temp (!) 97.5 F (36.4 C) (Oral)   Resp 20   Ht 5\' 9"  (1.753 m)   Wt 66.3 kg   SpO2 99%   BMI 21.58 kg/m  SpO2: SpO2: 99 %  O2 Device: O2 Device: Room Air O2 Flow Rate: O2 Flow Rate (L/min): 2 L/min  Intake/output summary:   Intake/Output Summary (Last 24 hours) at 07/22/2019 2347 Last data filed at 07/22/2019 1810 Gross per 24 hour  Intake 956.92 ml  Output 350 ml  Net 606.92 ml   LBM: Last BM Date: 07/21/19 Baseline Weight: Weight: 66.1 kg Most recent weight: Weight: 66.3 kg       Palliative Assessment/Data: TPN     Flowsheet Rows     Most Recent Value  Intake Tab  Referral Department  Hospitalist  Unit at Time of Referral  -- [urology/telementry]  Palliative Care Primary Diagnosis  Cancer  Date Notified  07/12/19  Palliative Care Type  New Palliative care  Reason for referral  Clarify Goals of Care  Date of Admission  06/26/19  Date first seen by Palliative Care  07/13/19  # of days Palliative referral response time  1 Day(s)  # of days IP prior to Palliative referral  16  Clinical Assessment  Psychosocial & Spiritual Assessment  Palliative Care Outcomes      Patient Active Problem List   Diagnosis Date Noted  . Pressure injury of skin 07/16/2019  . Dysphagia, oropharyngeal 07/13/2019  . FTT (failure to thrive) in adult   . Severe protein-calorie malnutrition ([place])   . Palliative care encounter   . Sinus tachycardia 06/27/2019  . Type 2 diabetes mellitus without complication, without long-term current use of insulin ([place]) 06/26/2019  . SBO (small bowel obstruction) s/p adhesiolysis 07/01/2019 06/26/2019  . AKI (acute kidney injury) ([place]) 06/26/2019  . Nausea and vomiting 06/26/2019  . Abdominal pain 06/26/2019  . Lactic acidosis 06/26/2019  . Elevated  troponin 06/26/2019  . Essential hypertension 02/22/2019  . Exertional dyspnea 02/04/2019  . Vision disturbance 12/19/2018  . TIA (transient ischemic attack) 12/19/2018  . Malignant carcinoid tumor to left adnexa 10/06/2017  . Elevated CA-125 08/31/2017    Palliative Care Assessment & Plan   Recommendations/Plan:  DNR/DNI  Still limited in her intake but some improvement over the weekend.  Continue Megace and taper off of TPN.  Called and discussed with her daughters regarding plan.  Family understanding and in agreement with plan.  They remain hopeful that her intake will continue to improve.  PMT will continue to support and follow.    Goals of Care and Additional Recommendations:  Limitations on Scope of Treatment: Full Scope Treatment  Code Status:    Code Status Orders  (From admission, onward)         Start     Ordered   07/05/19 0754  Do not attempt resuscitation (DNR)  Continuous    Question Answer Comment  In the event of cardiac or respiratory ARREST Do not call a "code blue"   In the event of cardiac or respiratory ARREST Do not perform Intubation, CPR, defibrillation or ACLS   In the event of cardiac or respiratory ARREST Use medication by any route, position, wound care, and other measures to relive pain and suffering. May use oxygen, suction and manual treatment of airway obstruction as needed for comfort.      07/05/19 0754        Code Status History    Date Active Date Inactive Code Status Order ID Comments User Context   06/26/2019 1957 07/05/2019 0754 Full Code [id number]  Rhetta Mura DO ED   08/31/2017 1650 09/05/2017 1751 Full Code [id number]  Lahoma Crocker, MD Inpatient   Advance Care Planning  Activity       Prognosis:   Guarded  Discharge Planning:  To Be Determined  Care plan was discussed with patient's family (daughter Gloria Murphy via phone).   Thank you for allowing the Palliative Medicine Team to assist in the care of this  patient.  Time Total: 20 min.   Visit consisted of counseling and education dealing with the complex and emotionally intense issues of symptom management and palliative care in the setting of serious and potentially life-threatening illness.Greater than 50%  of this time was spent counseling and coordinating care related to the above assessment and plan.  Micheline Rough, MD [facility] Team [phone]  Please contact Palliative Medicine Team phone at [phone] for questions and concerns.

## 2019-07-22 NOTE — Progress Notes (Signed)
Physical Therapy Treatment Patient Details Name: Gloria Murphy MRN: [id number] DOB: 26-Dec-1935 Today's Date: 07/22/2019    History of Present Illness 84 year old woman admitted from home on 2/17 due to nausea and vomiting with decreased p.o. intake. She has a history of a pelvic carcinoid tumor for which she underwent exploratory laparotomy in April 2019 with bilateral salpingo-oophorectomy and omentectomy as well as partial hysterectomy  In the ED she was found to have a large small bowel obstruction and gastric distention.  NG tube was placed, exploratory laparotomy 2/22.     PT Comments    Pt unable to assist as well today compared to 07/18/19 visit.  Pt also not as alert or conversant. Pt requiring mod-max assist for bed mobility and to fatigued to complete sitting EOB.  Pt requested to rest so repositioned in sidelying.  Continue to recommend SNF upon d/c.    Follow Up Recommendations  SNF     Equipment Recommendations  None recommended by PT    Recommendations for Other Services       Precautions / Restrictions Precautions Precautions: Fall    Mobility  Bed Mobility Overal bed mobility: Needs Assistance Bed Mobility: Rolling;Sidelying to Sit;Sit to Sidelying Rolling: Mod assist Sidelying to sit: Max assist       General bed mobility comments: multimodal cues for log roll technique and pt assisting as able however unable to complete upright sitting as pt stopped assisting; pt reports she is too fatigued and requested to remain in bed despite encouragement; pt repositioned in sidelying with pillow between knees  Transfers                 General transfer comment: pt felt unable  Ambulation/Gait                 Stairs             Wheelchair Mobility    Modified Rankin (Stroke Patients Only)       Balance                                            Cognition Arousal/Alertness: Awake/alert Behavior During Therapy: Flat  affect Overall Cognitive Status: No family/caregiver present to determine baseline cognitive functioning Area of Impairment: Following commands;Problem solving                 Orientation Level: Disoriented to;Place;Time     Following Commands: Follows one step commands with increased time;Follows one step commands consistently;Follows multi-step commands inconsistently     Problem Solving: Slow processing;Decreased initiation;Difficulty sequencing;Requires verbal cues;Requires tactile cues        Exercises      General Comments        Pertinent Vitals/Pain Pain Assessment: Faces Faces Pain Scale: Hurts little more Pain Location: grimacing/groaning with movement however did not indicate location of pain Pain Descriptors / Indicators: Guarding;Grimacing Pain Intervention(s): Monitored during session;Repositioned    Home Living                      Prior Function            PT Goals (current goals can now be found in the care plan section) Progress towards PT goals: Progressing toward goals    Frequency    Min 2X/week      PT Plan Current plan remains appropriate  Co-evaluation              AM-PAC PT "6 Clicks" Mobility   Outcome Measure  Help needed turning from your back to your side while in a flat bed without using bedrails?: A Lot Help needed moving from lying on your back to sitting on the side of a flat bed without using bedrails?: A Lot Help needed moving to and from a bed to a chair (including a wheelchair)?: A Lot Help needed standing up from a chair using your arms (e.g., wheelchair or bedside chair)?: A Lot Help needed to walk in hospital room?: Total Help needed climbing 3-5 steps with a railing? : Total 6 Click Score: 10    End of Session   Activity Tolerance: Patient limited by fatigue Patient left: in bed;with call bell/phone within reach;with bed alarm set   PT Visit Diagnosis: Other abnormalities of gait and  mobility (R26.89);Muscle weakness (generalized) (M62.81)     Time: 1115-1130 PT Time Calculation (min) (ACUTE ONLY): 15 min  Charges:  $Therapeutic Activity: 8-22 mins                     Arlyce Dice, DPT Acute Rehabilitation Services Office: [phone]  York Ram E 07/22/2019, 2:29 PM

## 2019-07-22 NOTE — Progress Notes (Signed)
PHARMACY - TOTAL PARENTERAL NUTRITION CONSULT NOTE   Indication: bowel obstruction, prolonged ileus  Patient Measurements: Height: 5' 9" (175.3 cm) Weight: 146 lb 1.6 oz (66.3 kg) IBW/kg (Calculated) : 66.2 TPN AdjBW (KG): 69 Body mass index is 21.58 kg/m.  Assessment: 83 yo woman with hx of pelvic carcinoid tumor, admitted with N/V, decreased PO intake was found to have SBO and gastric distention.   Pt had normal swallow exam on 3/11, determined that PEG tube not needed. Weaning TPN; Megace added.  Glucose / Insulin: Hx DM2, diet-controlled;  - on moderate SSI 98 units in 24 hrs); 10 units insulin in TPN  - cbgs <150 Electrolytes: Na 135, K 3.6, CorrCa 10.2, phos 2.8. Mag low 1.6 Renal: no new labs; previously WNL/stable; UOP incompletely charted LFTs / TGs:  AST/ALT mildly elevated but stable, Alk phos elevated (322) but somewhat stable; TBili stable WNL, TGs WNL Prealbumin: 16.9 > 12.1 > 7.6 > 7.8 (3/8)  Intake / Output; MIVF: no MIVF; 48 hr calorie count over the weekend although intake remains poor GI Imaging: 2/27 Abdominal CT: findings c/w SBO 3/1 KUB: ileus vs obstruction, interval improvement in small bowel dilatation Surgeries / Procedures: 2/22 exploratory laparotomy (n/a) lysis of adhesions Central access: PICC 2/22, pt pulled PICC out on 2/23 ~ 2100,  re-insert PICC 2/26 and restart TPN TPN start date: 2/22-23, 2/26 >>   Nutritional Goals RD recs (3/8): KCal: 2000-2200 , Protein: 100-115 gm , Fluid: >/= 2 L/day  Goal TPN rate of 85 mL/hr (provides 102 g of protein and 2060 kcals per day) Protein: 50 g/L Lipids 30 g/L Dextrose 15% (GIR 3.18)  Current Nutrition:  - Dysphagia III diet, Boost Breeze TID, Beneprotein TID - TPN at hafl of  goal rate  Plan:  - magnesium sulfate 2gm IV x1 today  - Per Dr. Pokhrel, d/c TPN today - will d/c TPN after current bag runs out today - Pharmacy will sign off.  ,  P, PharmD 07/22/19 7:14 AM  

## 2019-07-22 NOTE — Progress Notes (Signed)
PROGRESS NOTE  Gloria Murphy [id number] DOB: 03-Jan-1936 DOA: 06/26/2019 PCP: Carlyle Basques, MD   LOS: 26 days   Brief narrative: As per HPI,  84 year old woman with history of carcinoid tumor with history of extensive surgery admitted from home on 2/17 due to nausea and vomiting with decreased p.o. intake.She was found to have bowel obstruction due to adhesions. Patient was taken to the OR on 07/01/2019 for lysis of adhesions. Again, she ended up having recurrence of small bowel obstruction. She was started on clear liquids on 07/09/2019. Transfer to med telemetry unit 3/3. She has continued to have poor p.o. intake and had worsening encephalopathy throughout hospitalization.   Palliative care was consulted.  Assessment/Plan:  Principal Problem:   SBO (small bowel obstruction) s/p adhesiolysis 07/01/2019 Active Problems:   Malignant carcinoid tumor to left adnexa   Essential hypertension   Type 2 diabetes mellitus without complication, without long-term current use of insulin (HCC)   AKI (acute kidney injury) ([place])   Nausea and vomiting   Abdominal pain   Lactic acidosis   Elevated troponin   Sinus tachycardia   Dysphagia, oropharyngeal   FTT (failure to thrive) in adult   Severe protein-calorie malnutrition ([place])   Palliative care encounter   Pressure injury of skin  SIRS / Acute hypoxic respiratory failure,  recurrent aspiration pneumonia.  . Completed Unasyn 2/26->3/3 for aspiration pneumonia.  Had recurrent aspiration and was started on Zosyn on 07/15/2019.    Patient underwent speech and swallow evaluation on 07/18/2019 with no aspiration.  Afebrile today.  No leukocytosis.  Recommended dysphagia 3 diet.  Will need to continue aspiration precautions  Recurrent SBO s/p exploratory laparotomy with lysis of adhesions (07/01/2019), history of multiple abdominal surgeries / Failure to thrive  Currently on half dose of TPN, will discontinue TPN today..  Patient on board and  recommended to discontinue today and continue to encourage oral.   on dysphagia 3 diet.   Palliative care on board.   Added Megace.    AKI secondary to intravascular volume depletion.  Resolved.  Acute metabolic encephalopathy, multifactorial from hyponatremia, hospital-acquired delirium, Medication induced, SIRS, Hypoxia in setting of dementia.  Improved. Continue delirium precaution.  We will try to minimize narcotic sedatives if needed for possible.  Essential hypertension On Coreg , as needed labetalol.  Overall controlled.  Type 2 diabetes mellitus  Continue sliding scale insulin, Accu-Cheks.  POC glucose of 137.  Hyponatremia  improved.  Hypokalemia, improved.  Potassium 3.6.   Thyroid issues. TSH is suppressed and T4 is mildly elevated while T3 is normal.  We will hold off with thyroid medications at this time.  Elevated LFTs, possibly from TPN.  Will wean TPN.  History of pelvic carcinoid tumor followed by Dr. Denman George.  Had extensive surgery in the past.  Failure to thrive, severe protein calorie malnutrition.  Wean TPN T Dysphagia 3 diet .  Encourage oral intake.  Continue Megace.  Patient has eaten better over the last 2 days.   VTE Prophylaxis: Lovenox subcu  Code Status: DO NOT RESUSCITATE  Family Communication: I had a prolonged discussion with the patient's daughter on the phone today.  Updated about the clinical condition of the patient and the plan for disposition to skilled nursing facility when a bed is available.  Disposition Plan:  . Patient is from home . Likely disposition to skilled facility likely when bed is available. . Barriers to discharge: Wean off TPN,  on dysphagia 3, skilled nursing facility when bed  is available  Consultants:  General surgery  Palliative care  Procedures:  PICC line placement and TPN.  NG tube placement  Laboratory laparotomy with lysis of adhesions on 07/01/2019.  Staples removed 07/12/19  Antibiotics:  . Zosyn  07/15/2019>>3/14  Anti-infectives (From admission, onward)   Start     Dose/Rate Route Frequency Ordered Stop   07/15/19 1000  piperacillin-tazobactam (ZOSYN) IVPB 3.375 g  Status:  Discontinued     3.375 g 12.5 mL/hr over 240 Minutes Intravenous Every 8 hours 07/15/19 0825 07/21/19 1218   07/05/19 1700  Ampicillin-Sulbactam (UNASYN) 3 g in sodium chloride 0.9 % 100 mL IVPB  Status:  Discontinued     3 g 200 mL/hr over 30 Minutes Intravenous Every 6 hours 07/05/19 1619 07/10/19 0715   07/01/19 1015  cefoTEtan (CEFOTAN) 2 g in sodium chloride 0.9 % 100 mL IVPB     2 g 200 mL/hr over 30 Minutes Intravenous On call to O.R. 07/01/19 0847 07/01/19 1234   06/27/19 1000  piperacillin-tazobactam (ZOSYN) IVPB 3.375 g  Status:  Discontinued     3.375 g 12.5 mL/hr over 240 Minutes Intravenous Every 8 hours 06/27/19 0807 06/29/19 1119   06/27/19 0200  piperacillin-tazobactam (ZOSYN) IVPB 2.25 g  Status:  Discontinued     2.25 g 100 mL/hr over 30 Minutes Intravenous Every 8 hours 06/26/19 2012 06/27/19 0807   06/26/19 1700  piperacillin-tazobactam (ZOSYN) IVPB 3.375 g     3.375 g 100 mL/hr over 30 Minutes Intravenous  Once 06/26/19 1651 06/26/19 1823     Subjective:  Today, patient was seen and examined at bedside.  Denies any interval complaints denies any shortness of breath, fever, chills or rigor.  Objective: Vitals:   07/22/19 0520 07/22/19 1008  BP: 135/74 (!) 116/55  Pulse: 99 100  Resp:  16  Temp: 99.4 F (37.4 C) 97.6 F (36.4 C)  SpO2:  100%    Intake/Output Summary (Last 24 hours) at 07/22/2019 1211 Last data filed at 07/22/2019 1008 Gross per 24 hour  Intake 630.72 ml  Output 200 ml  Net 430.72 ml   Filed Weights   07/18/19 0622 07/21/19 0530 07/22/19 0550  Weight: 69 kg 66 kg 66.3 kg   Body mass index is 21.58 kg/m.   Physical Exam: General: Thinly built built, not in obvious distress, alert awake and communicative HENT: Normocephalic, pupils equally reacting to  light and accommodation.  No scleral pallor or icterus noted. Oral mucosa is moist.  Chest:  Clear breath sounds.  Diminished breath sounds bilaterally. No crackles or wheezes.  CVS: S1 &S2 heard. No murmur.  Regular rate and rhythm. Abdomen: Soft, nontender, nondistended.  Bowel sounds are heard.  Status post removal of staples.  Extremities: No cyanosis, clubbing or edema.  Peripheral pulses are palpable. Psych: Alert, awake and communicative. CNS:  No cranial nerve deficits.  Power equal in all extremities.   Skin: Warm and dry.  Surgical incision of the abdomen.    Data Review: I have personally reviewed the following laboratory data and studies,  CBC: Recent Labs  Lab 07/16/19 0415 07/18/19 0449 07/19/19 0500 07/22/19 0330  WBC 4.5 4.2 5.0 4.4  NEUTROABS  --   --   --  2.6  HGB 7.9* 7.9* 8.1* 7.7*  HCT 24.6* 24.6* 25.7* 24.6*  MCV 78.3* 79.1* 80.1 79.6*  PLT 324 344 363 [id number]   Basic Metabolic Panel: Recent Labs  Lab 07/18/19 0449 07/19/19 0500 07/20/19 0443 07/21/19 2333 07/22/19 0828  NA  132* 133* 134* 135 135  K 3.8 3.9 3.7 3.5 3.6  CL 102 102 103 105 106  CO2 23 22 22 22  21*  GLUCOSE 149* 141* 151* 103* 115*  BUN 24* 22 25* 23 21  CREATININE 0.64 0.64 0.69 0.58 0.62  CALCIUM 8.5* 8.8* 8.7* 8.5* 8.6*  MG 2.1 1.7 2.0 1.9 1.6*  PHOS 3.1 2.8 3.1 2.8 2.8   Liver Function Tests: Recent Labs  Lab 07/16/19 0415 07/18/19 0449 07/19/19 0500 07/21/19 2333 07/22/19 0828  AST 59* 70* 77* 55* 51*  ALT 62* 82* 84* 63* 63*  ALKPHOS 301* 346* 382* 320* 322*  BILITOT 0.6 0.5 0.7 0.5 0.5  PROT 6.6 6.8 7.1 7.0 7.1  ALBUMIN 1.9* 2.1* 2.0* 1.9* 2.0*   No results for input(s): LIPASE, AMYLASE in the last 168 hours. No results for input(s): AMMONIA in the last 168 hours. Cardiac Enzymes: No results for input(s): CKTOTAL, CKMB, CKMBINDEX, TROPONINI in the last 168 hours. BNP (last 3 results) No results for input(s): BNP in the last 8760 hours.  ProBNP (last 3  results) No results for input(s): PROBNP in the last 8760 hours.  CBG: Recent Labs  Lab 07/21/19 1130 07/21/19 1642 07/22/19 0000 07/22/19 0556 07/22/19 1159  GLUCAP 137* 135* 133* 111* 137*   Recent Results (from the past 240 hour(s))  Culture, blood (routine x 2)     Status: None   Collection Time: 07/15/19  9:08 AM   Specimen: BLOOD  Result Value Ref Range Status   Specimen Description   Final    BLOOD RIGHT ANTECUBITAL Performed at [facility] [address], [place], [place] [address]    Special Requests   Final    BOTTLES DRAWN AEROBIC AND ANAEROBIC Blood Culture adequate volume Performed at [facility] [address] [place], [place] [address]    Culture   Final    NO GROWTH 5 DAYS Performed at [facility], [place] [address], [place], [place] [address]    Report Status 07/20/2019 FINAL  Final  Culture, blood (routine x 2)     Status: None   Collection Time: 07/15/19  9:14 AM   Specimen: BLOOD RIGHT HAND  Result Value Ref Range Status   Specimen Description   Final    BLOOD RIGHT HAND Performed at [facility] [address], [place], [place] [address]    Special Requests   Final    BOTTLES DRAWN AEROBIC ONLY Blood Culture adequate volume Performed at [facility] [address], [place], [place] [address]    Culture   Final    NO GROWTH 5 DAYS Performed at [facility], [place] [address] [place], [place] [address]    Report Status 07/20/2019 FINAL  Final     Studies: No results found.  Flora Lipps, MD  [facility] 07/22/2019

## 2019-07-23 DIAGNOSIS — Y9389 Activity, other specified: Secondary | ICD-10-CM | POA: Diagnosis not present

## 2019-07-23 DIAGNOSIS — Z48815 Encounter for surgical aftercare following surgery on the digestive system: Secondary | ICD-10-CM | POA: Diagnosis not present

## 2019-07-23 DIAGNOSIS — K565 Intestinal adhesions [bands], unspecified as to partial versus complete obstruction: Secondary | ICD-10-CM | POA: Diagnosis not present

## 2019-07-23 DIAGNOSIS — J690 Pneumonitis due to inhalation of food and vomit: Secondary | ICD-10-CM | POA: Diagnosis not present

## 2019-07-23 DIAGNOSIS — Z743 Need for continuous supervision: Secondary | ICD-10-CM | POA: Diagnosis not present

## 2019-07-23 DIAGNOSIS — N179 Acute kidney failure, unspecified: Secondary | ICD-10-CM | POA: Diagnosis not present

## 2019-07-23 DIAGNOSIS — R279 Unspecified lack of coordination: Secondary | ICD-10-CM | POA: Diagnosis not present

## 2019-07-23 DIAGNOSIS — C7A00 Malignant carcinoid tumor of unspecified site: Secondary | ICD-10-CM | POA: Diagnosis not present

## 2019-07-23 DIAGNOSIS — E1169 Type 2 diabetes mellitus with other specified complication: Secondary | ICD-10-CM | POA: Diagnosis not present

## 2019-07-23 DIAGNOSIS — E43 Unspecified severe protein-calorie malnutrition: Secondary | ICD-10-CM | POA: Diagnosis not present

## 2019-07-23 DIAGNOSIS — R2689 Other abnormalities of gait and mobility: Secondary | ICD-10-CM | POA: Diagnosis not present

## 2019-07-23 DIAGNOSIS — D3A00 Benign carcinoid tumor of unspecified site: Secondary | ICD-10-CM | POA: Diagnosis not present

## 2019-07-23 DIAGNOSIS — R410 Disorientation, unspecified: Secondary | ICD-10-CM | POA: Diagnosis not present

## 2019-07-23 DIAGNOSIS — R1084 Generalized abdominal pain: Secondary | ICD-10-CM | POA: Diagnosis not present

## 2019-07-23 DIAGNOSIS — L89152 Pressure ulcer of sacral region, stage 2: Secondary | ICD-10-CM

## 2019-07-23 DIAGNOSIS — R000 Tachycardia, unspecified: Secondary | ICD-10-CM

## 2019-07-23 DIAGNOSIS — R1312 Dysphagia, oropharyngeal phase: Secondary | ICD-10-CM | POA: Diagnosis not present

## 2019-07-23 DIAGNOSIS — R778 Other specified abnormalities of plasma proteins: Secondary | ICD-10-CM | POA: Diagnosis not present

## 2019-07-23 DIAGNOSIS — G9341 Metabolic encephalopathy: Secondary | ICD-10-CM | POA: Diagnosis not present

## 2019-07-23 DIAGNOSIS — Z515 Encounter for palliative care: Secondary | ICD-10-CM | POA: Diagnosis not present

## 2019-07-23 DIAGNOSIS — R1311 Dysphagia, oral phase: Secondary | ICD-10-CM | POA: Diagnosis not present

## 2019-07-23 DIAGNOSIS — Z7189 Other specified counseling: Secondary | ICD-10-CM

## 2019-07-23 DIAGNOSIS — R2681 Unsteadiness on feet: Secondary | ICD-10-CM | POA: Diagnosis not present

## 2019-07-23 DIAGNOSIS — K219 Gastro-esophageal reflux disease without esophagitis: Secondary | ICD-10-CM | POA: Diagnosis not present

## 2019-07-23 DIAGNOSIS — K56609 Unspecified intestinal obstruction, unspecified as to partial versus complete obstruction: Secondary | ICD-10-CM | POA: Diagnosis not present

## 2019-07-23 DIAGNOSIS — J9601 Acute respiratory failure with hypoxia: Secondary | ICD-10-CM | POA: Diagnosis not present

## 2019-07-23 DIAGNOSIS — I10 Essential (primary) hypertension: Secondary | ICD-10-CM | POA: Diagnosis not present

## 2019-07-23 LAB — GLUCOSE, CAPILLARY
Glucose-Capillary: 114 mg/dL — ABNORMAL HIGH (ref 70–99)
Glucose-Capillary: 135 mg/dL — ABNORMAL HIGH (ref 70–99)
Glucose-Capillary: 149 mg/dL — ABNORMAL HIGH (ref 70–99)

## 2019-07-23 LAB — RESPIRATORY PANEL BY RT PCR (FLU A&B, COVID)
Influenza A by PCR: NEGATIVE
Influenza B by PCR: NEGATIVE
SARS Coronavirus 2 by RT PCR: NEGATIVE

## 2019-07-23 MED ORDER — BISACODYL 10 MG RE SUPP: 10.0000 mg | Freq: Every day | RECTAL | 0 refills | Status: DC | PRN

## 2019-07-23 MED ORDER — RESOURCE INSTANT PROTEIN PO PWD PACKET: 2.0000 | Freq: Three times a day (TID) | ORAL | Status: DC

## 2019-07-23 MED ORDER — CARVEDILOL 6.25 MG PO TABS: 6.2500 mg | ORAL_TABLET | Freq: Two times a day (BID) | ORAL | Status: DC

## 2019-07-23 MED ORDER — CHLORHEXIDINE GLUCONATE 0.12 % MT SOLN: 15.0000 mL | Freq: Two times a day (BID) | OROMUCOSAL | 0 refills | Status: DC

## 2019-07-23 MED ORDER — MAGNESIUM OXIDE 400 (241.3 MG) MG PO TABS: 400.0000 mg | ORAL_TABLET | Freq: Two times a day (BID) | ORAL | 0 refills | Status: DC

## 2019-07-23 MED ORDER — POLYETHYLENE GLYCOL 3350 17 G PO PACK: 17.0000 g | PACK | Freq: Every day | ORAL | Status: DC

## 2019-07-23 MED ORDER — GUAIFENESIN 100 MG/5ML PO SOLN: 5.0000 mL | ORAL | Status: DC | PRN

## 2019-07-23 MED ORDER — PANTOPRAZOLE SODIUM 40 MG PO TBEC: 40.0000 mg | DELAYED_RELEASE_TABLET | Freq: Every day | ORAL | Status: DC

## 2019-07-23 MED ORDER — AMLODIPINE BESYLATE 10 MG PO TABS: 10.0000 mg | ORAL_TABLET | Freq: Every day | ORAL | Status: DC

## 2019-07-23 MED ORDER — MEGESTROL ACETATE 400 MG/10ML PO SUSP: 400.0000 mg | Freq: Two times a day (BID) | ORAL | 0 refills | Status: DC

## 2019-07-23 MED ORDER — ALUM & MAG HYDROXIDE-SIMETH 200-200-20 MG/5ML PO SUSP: 30.0000 mL | Freq: Four times a day (QID) | ORAL | 0 refills | Status: DC | PRN

## 2019-07-23 NOTE — Discharge Summary (Addendum)
Physician Discharge Summary  Gloria Murphy [id number] DOB: [birth year] DOA: 06/26/2019  PCP: Carlyle Basques, MD  Admit date: 06/26/2019 Discharge date: 07/23/2019  Admitted From: Home  Discharge disposition: SNF   Recommendations for Outpatient Follow-Up:   . Follow up with your primary care provider at the skilled nursing facility in 3 to 5 days . Check CBC, BMP, LFT in the next visit. [name] Please encourage oral nutrition for the patient and offer food of her choice.  Dysphagia 3 diet recommended.  Megace has been prescribed will need to continue. [name] Please discuss about palliative care referral . Try to minimize sedative-hypnotics for the patient . Follow-up with Dr. Marlou Starks, general surgery 2 to 3 weeks after discharge from the hospital   Discharge Diagnosis:   Principal Problem:   SBO (small bowel obstruction) s/p adhesiolysis 07/01/2019 Active Problems:   Malignant carcinoid tumor to left adnexa   Essential hypertension   Type 2 diabetes mellitus without complication, without long-term current use of insulin (HCC)   AKI (acute kidney injury) ([place])   Nausea and vomiting   Abdominal pain   Lactic acidosis   Elevated troponin   Sinus tachycardia   Dysphagia, oropharyngeal   FTT (failure to thrive) in adult   Severe protein-calorie malnutrition ([place])   Palliative care encounter   Pressure injury of skin   Discharge Condition: Improved.  Diet recommendation: Dysphagia 3 diet.  Wound care: Sacral ulcer care  Code status: DNR   History of Present Illness:   84 year old woman with history of carcinoid tumor with history of extensive surgery admitted from home on 2/17 due to nausea and vomiting with decreased p.o. intake.She was found to have bowel obstruction due to adhesions. Patient was taken to the OR on 07/01/2019 for lysis of adhesions. Again, she ended up having recurrence of small bowel obstruction. She was started on clear liquids on 07/09/2019. Transfer to  med telemetry unit 3/3. She did have poor oral. intake and had worsening encephalopathy throughout hospitalization so palliative care was consulted.  Discussion about PEG tube was put forth with the family who have decided against PEG tube.  At this time, her mentation has  improved and she has been advanced on her oral diet.   Hospital Course:   Following conditions were addressed during hospitalization as listed below,  SIRS / Acute hypoxic respiratory failure,  recurrent aspiration pneumonia.    Currently on room air. Completed Unasyn 2/26->3/3 for aspiration pneumonia.  Had recurrent aspiration and was started on Zosyn on 07/15/2019.  Patient underwent speech and swallow evaluation on 07/18/2019 with no aspiration.  Afebrile at this time without leukocytosis. Recommended dysphagia 3 diet.  Will need to continue aspiration precautions.  Recurrent SBO s/p exploratory laparotomy with lysis of adhesions (07/01/2019), history of multiple abdominal surgeries/Failure to thrive  Patient was initially on TPN which has been discontinued.  Recommend to continue dysphagia 3 diet nutritional supplements.  Continue Megace.  Follow-up with surgery Dr. Marlou Starks as outpatient  AKI secondary to intravascular volume depletion.  Resolved.  Acute metabolic encephalopathy, multifactorial from hyponatremia, hospital-acquired delirium, Medication induced, SIRS, Hypoxiain setting of dementia.  Improved. Continue delirium precaution.    Please try to minimize narcotic sedatives if needed for possible.  Essential hypertension On Coreg  Type 2 diabetes mellitus.  She was put on sliding-scale insulin due to TPN.  At this time,  oral intake is low.  Please monitor Accu-Cheks.  Not on medications  Hyponatremia improved.  Sodium of 135 prior to  discharge  Hypokalemia, improved.  Potassium 3.6 prior to discharge.   Thyroid issues. TSH is suppressed and T4 is mildly elevated while T3 is normal.  We will hold off  with thyroid medications at this time.  Will need outpatient TSH T3-T4 monitoring in 2 to 3 weeks.  Elevated LFTs, possibly from TPN.   Monitor LFTs in the next blood work.  History of pelvic carcinoid tumor followed by Dr. Denman George.  Had extensive surgery in the past.  Will   Failure to thrive, severe protein calorie malnutrition.    Has been weaned off TPN.   Continue dysphagia 3 diet with nutritional supplements. Continue Megace.  Patient has eaten better over the last few days.  Stage II coccygeal pressure ulceration.  Continue wound care  Disposition.  At this time, patient is stable for disposition to skilled nursing facility.  Have spoken with the patient's family in depth yesterday regarding disposition.  Medical Consultants:    Palliative care  Procedures:    PICC line placement and removal  Subjective:   Today, patient denies interval complaints.  Denies any nausea, vomiting or abdominal pain.  No fever cough or shortness of breath.  Staff reported that that she has eaten better today.  Discharge Exam:   Vitals:   07/23/19 0223 07/23/19 0941  BP: 120/66 113/69  Pulse: 89 91  Resp: (!) 24 20  Temp: 99.3 F (37.4 C) 98.5 F (36.9 C)  SpO2: 98% 98%   Vitals:   07/22/19 2254 07/23/19 0223 07/23/19 0500 07/23/19 0941  BP: 115/64 120/66  113/69  Pulse: 99 89  91  Resp: (!) 22 (!) 24  20  Temp: (!) 97.5 F (36.4 C) 99.3 F (37.4 C)  98.5 F (36.9 C)  TempSrc: Oral Oral  Oral  SpO2: 99% 98%  98%  Weight:   66.8 kg   Height:       General: Alert awake, not in obvious distress, thinly built, HENT: pupils equally reacting to light,  No scleral pallor or icterus noted. Oral mucosa is moist.  Chest:  Clear breath sounds.  Diminished breath sounds bilaterally. No crackles or wheezes.  CVS: S1 &S2 heard. No murmur.  Regular rate and rhythm. Abdomen: Soft, nontender, nondistended.  Bowel sounds are heard.  Status post removal of the staples. Extremities: No  cyanosis, clubbing or edema.  Peripheral pulses are palpable. Psych: Alert, awake and communicative CNS:  No cranial nerve deficits.  Power equal in all extremities.  Generalized weakness Skin: Warm and dry.  Surgical incision of the abdomen  The results of significant diagnostics from this hospitalization (including imaging, microbiology, ancillary and laboratory) are listed below for reference.     Diagnostic Studies:   DG Chest Port 1 View  Result Date: 06/26/2019 CLINICAL DATA:  Shortness of breath EXAM: PORTABLE CHEST 1 VIEW COMPARISON:  06/03/2010 FINDINGS: Cardiac shadows within normal limits. The lungs are well aerated bilaterally. No focal infiltrate or sizable effusion is seen. No bony abnormality is noted. IMPRESSION: No active disease. Electronically Signed   By: Inez Catalina M.D.   On: 06/26/2019 14:40   DG Abd Portable 1 View  Result Date: 06/26/2019 CLINICAL DATA:  NG tube placement EXAM: PORTABLE ABDOMEN - 1 VIEW COMPARISON:  CT 06/26/2019 FINDINGS: Esophageal tube tip overlies the mid gastric region. Incompletely visualized dilated small bowel in the left lower quadrant. Bubbly lucencies in the central abdomen, may correspond to gastric pneumatosis on recent CT IMPRESSION: Esophageal tube tip overlies the mid gastric  region Electronically Signed   By: Donavan Foil M.D.   On: 06/26/2019 20:42   CT Renal Stone Study  Result Date: 06/26/2019 CLINICAL DATA:  Flank pain, nausea vomiting history of ovarian carcinoid tumor. EXAM: CT ABDOMEN AND PELVIS WITHOUT CONTRAST TECHNIQUE: Multidetector CT imaging of the abdomen and pelvis was performed following the standard protocol without IV contrast. COMPARISON:  07/27/2017 FINDINGS: Lower chest: Basilar atelectasis though with nodular changes at the left lung base. These are ill-defined. No dense consolidation or effusion. Hepatobiliary: Signs of portal venous gas. Limited assessment of the liver on noncontrast imaging. No signs of biliary  ductal dilation. Pancreas: Pancreas displaced by markedly distended stomach. No signs of focal pancreatic lesion. Spleen: Spleen is normal. Adrenals/Urinary Tract: Adrenal glands are normal. No signs of hydronephrosis or nephrolithiasis. Stomach/Bowel: Gastric distension and small bowel dilation terminating in the distal jejunum in the right lower quadrant anterior to the iliac vasculature. Distal small bowel loops are decompressed. Adjacent to the appendix is a mesenteric nodule with calcification measuring approximately 12 mm. There is mild distension of the distal ileum. Question of filling defect within the distal ileum with 53 Hounsfield unit density within the terminal ileum at this location. The appendix is normal. Signs of gastric pneumatosis likely the source of portal venous gas. Gas in the wall of the stomach is best seen on image 51 of series 2. There is gas surrounding intraluminal contents of the stomach in other areas potentially intermixed with gastric pneumatosis as well, for instance on image 35 along the lateral stomach. Gas within the extrahepatic portal system is not seen. Vascular/Lymphatic: Calcified atherosclerotic changes throughout the abdominal aorta. No signs of adenopathy in the retroperitoneum. Presumed mesenteric mass or adenopathy in the right lower quadrant (image 53, series 2) this is measured above. Reproductive: Post hysterectomy. No sign of pelvic mass with transition point for small-bowel obstruction in the pelvis. Other: No signs of free air at this time. Small amount of fluid in the pelvis. Musculoskeletal: No signs of acute bone finding or destructive bone process. IMPRESSION: 1. Small-bowel obstruction with marked gastric distension, moderate to marked small bowel distension, associated with gastric pneumatosis and portal venous gas. Surgical consultation is suggested. 2. Constellation of findings likely related to adhesions in the pelvis given appearance of small bowel  loops. 3. 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 Subtle filling defect with soft tissue density suggested on today's evaluation. 4. Basilar subtle nodularity may represent early aspiration pneumonitis. Attention on follow-up. These results were called by telephone at the time of interpretation on 06/26/2019 at 5:11 pm to provider DAVID YAO and Dr. Vanita Panda, Who verbally acknowledged these results. Electronically Signed   By: Zetta Bills M.D.   On: 06/26/2019 17:11     Labs:   Basic Metabolic Panel: Recent Labs  Lab 07/18/19 0449 07/18/19 0449 07/19/19 0500 07/19/19 0500 07/20/19 0443 07/20/19 0443 07/21/19 2333 07/22/19 0828  NA 132*  --  133*  --  134*  --  135 135  K 3.8   < > 3.9   < > 3.7   < > 3.5 3.6  CL 102  --  102  --  103  --  105 106  CO2 23  --  22  --  22  --  22 21*  GLUCOSE 149*  --  141*  --  151*  --  103* 115*  BUN 24*  --  22  --  25*  --  23 21  CREATININE 0.64  --  0.64  --  0.69  --  0.58 0.62  CALCIUM 8.5*  --  8.8*  --  8.7*  --  8.5* 8.6*  MG 2.1  --  1.7  --  2.0  --  1.9 1.6*  PHOS 3.1  --  2.8  --  3.1  --  2.8 2.8   < > = values in this interval not displayed.   GFR Estimated Creatinine Clearance: 57.6 mL/min (by C-G formula based on SCr of 0.62 mg/dL). Liver Function Tests: Recent Labs  Lab 07/18/19 0449 07/19/19 0500 07/21/19 2333 07/22/19 0828  AST 70* 77* 55* 51*  ALT 82* 84* 63* 63*  ALKPHOS 346* 382* 320* 322*  BILITOT 0.5 0.7 0.5 0.5  PROT 6.8 7.1 7.0 7.1  ALBUMIN 2.1* 2.0* 1.9* 2.0*   No results for input(s): LIPASE, AMYLASE in the last 168 hours. No results for input(s): AMMONIA in the last 168 hours. Coagulation profile Recent Labs  Lab 07/18/19 0449  INR 1.1    CBC: Recent Labs  Lab 07/18/19 0449 07/19/19 0500 07/22/19 0330  WBC 4.2 5.0 4.4  NEUTROABS  --   --  2.6  HGB 7.9* 8.1* 7.7*  HCT 24.6* 25.7* 24.6*  MCV 79.1* 80.1 79.6*  PLT 344 363 326   Cardiac  Enzymes: No results for input(s): CKTOTAL, CKMB, CKMBINDEX, TROPONINI in the last 168 hours. BNP: Invalid input(s): POCBNP CBG: Recent Labs  Lab 07/22/19 1159 07/22/19 1806 07/23/19 0021 07/23/19 0704 07/23/19 1146  GLUCAP 137* 170* 149* 135* 114*   D-Dimer No results for input(s): DDIMER in the last 72 hours. Hgb A1c No results for input(s): HGBA1C in the last 72 hours. Lipid Profile Recent Labs    07/21/19 2333 07/22/19 0828  TRIG 66 75   Thyroid function studies No results for input(s): TSH, T4TOTAL, T3FREE, THYROIDAB in the last 72 hours.  Invalid input(s): FREET3 Anemia work up No results for input(s): VITAMINB12, FOLATE, FERRITIN, TIBC, IRON, RETICCTPCT in the last 72 hours. Microbiology Recent Results (from the past 240 hour(s))  Culture, blood (routine x 2)     Status: None   Collection Time: 07/15/19  9:08 AM   Specimen: BLOOD  Result Value Ref Range Status   Specimen Description   Final    BLOOD RIGHT ANTECUBITAL Performed at [facility] [address], [place], [place] [address]    Special Requests   Final    BOTTLES DRAWN AEROBIC AND ANAEROBIC Blood Culture adequate volume Performed at [facility] [address] [place], [place] [address]    Culture   Final    NO GROWTH 5 DAYS Performed at [facility], [place] [address] [place], [place] [address]    Report Status 07/20/2019 FINAL  Final  Culture, blood (routine x 2)     Status: None   Collection Time: 07/15/19  9:14 AM   Specimen: BLOOD RIGHT HAND  Result Value Ref Range Status   Specimen Description   Final    BLOOD RIGHT HAND Performed at [facility] [address], [place], [place] [address]    Special Requests   Final    BOTTLES DRAWN AEROBIC ONLY Blood Culture adequate volume Performed at [facility] [address] [place], [place] [address]    Culture   Final    NO GROWTH 5 DAYS Performed at  [facility], [place],  [place] [address]    Report Status 07/20/2019 FINAL  Final  Respiratory Panel by RT PCR (Flu A&B, Covid) - Nasopharyngeal Swab     Status: None   Collection Time: 07/23/19 11:00 AM   Specimen: Nasopharyngeal Swab  Result Value Ref Range Status   SARS Coronavirus 2 by RT PCR NEGATIVE NEGATIVE Final    Comment: (NOTE) SARS-CoV-2 target nucleic acids are NOT DETECTED. The SARS-CoV-2 RNA is generally detectable in upper respiratoy specimens during the acute phase of infection. The lowest concentration of SARS-CoV-2 viral copies this assay can detect is 131 copies/mL. A negative result does not preclude SARS-Cov-2 infection and should not be used as the sole basis for treatment or other patient management decisions. A negative result may occur with  improper specimen collection/handling, submission of specimen other than nasopharyngeal swab, presence of viral mutation(s) within the areas targeted by this assay, and inadequate number of viral copies (<131 copies/mL). A negative result must be combined with clinical observations, patient history, and epidemiological information. The expected result is Negative. Fact Sheet for Patients:  PinkCheek.be Fact Sheet for Healthcare Providers:  GravelBags.it This test is not yet ap proved or cleared by the [place] FDA and  has been authorized for detection and/or diagnosis of SARS-CoV-2 by FDA under an Emergency Use Authorization (EUA). This EUA will remain  in effect (meaning this test can be used) for the duration of the COVID-19 declaration under Section 564(b)(1) of the Act, 21 U.S.C. section 360bbb-3(b)(1), unless the authorization is terminated or revoked sooner.    Influenza A by PCR NEGATIVE NEGATIVE Final   Influenza B by PCR NEGATIVE NEGATIVE Final    Comment: (NOTE) The Xpert Xpress SARS-CoV-2/FLU/RSV assay is intended as an  aid in  the diagnosis of influenza from Nasopharyngeal swab specimens and  should not be used as a sole basis for treatment. Nasal washings and  aspirates are unacceptable for Xpert Xpress SARS-CoV-2/FLU/RSV  testing. Fact Sheet for Patients: PinkCheek.be Fact Sheet for Healthcare Providers: GravelBags.it This test is not yet approved or cleared by the [place] FDA and  has been authorized for detection and/or diagnosis of SARS-CoV-2 by  FDA under an Emergency Use Authorization (EUA). This EUA will remain  in effect (meaning this test can be used) for the duration of the  Covid-19 declaration under Section 564(b)(1) of the Act, 21  U.S.C. section 360bbb-3(b)(1), unless the authorization is  terminated or revoked. Performed at [facility], [place] [address], [place], [place] [address]      Discharge Instructions:   Discharge Instructions    Call MD for:   Complete by: As directed    Worsening symptoms   Discharge instructions   Complete by: As directed    Follow-up with your primary care provider at the skilled nursing facility in 3 to 5 days.  Encourage oral intake.   Increase activity slowly   Complete by: As directed      Allergies as of 07/23/2019   No Known Allergies     Medication List    STOP taking these medications   megestrol 625 MG/5ML suspension Commonly known as: MEGACE ES   omeprazole 20 MG capsule Commonly known as: PRILOSEC     TAKE these medications   alum & mag hydroxide-simeth 200-200-20 MG/5ML suspension Commonly known as: MAALOX/MYLANTA Take 30 mLs by mouth every 6 (six) hours as needed for indigestion or heartburn (or bloating).   amLODipine 10 MG tablet Commonly known as: NORVASC Take 1 tablet (  10 mg total) by mouth daily.   aspirin EC 81 MG tablet Take 1 tablet (81 mg total) by mouth daily.   bisacodyl 10 MG suppository Commonly known as: DULCOLAX Place 1  suppository (10 mg total) rectally daily as needed for moderate constipation or severe constipation.   CALTRATE 600 PO Take 1 tablet by mouth daily.   carvedilol 6.25 MG tablet Commonly known as: COREG Take 1 tablet (6.25 mg total) by mouth 2 (two) times daily with a meal.   chlorhexidine 0.12 % solution Commonly known as: PERIDEX 15 mLs by Mouth Rinse route 2 (two) times daily.   guaiFENesin 100 MG/5ML Soln Commonly known as: ROBITUSSIN Take 5 mLs (100 mg total) by mouth every 4 (four) hours as needed for cough or to loosen phlegm.   magnesium oxide 400 (241.3 Mg) MG tablet Commonly known as: MAG-OX Take 1 tablet (400 mg total) by mouth 2 (two) times daily.   megestrol 400 MG/10ML suspension Commonly known as: MEGACE Take 10 mLs (400 mg total) by mouth 2 (two) times daily.   ondansetron 4 MG tablet Commonly known as: ZOFRAN Take 1 tablet (4 mg total) by mouth every 6 (six) hours.   pantoprazole 40 MG tablet Commonly known as: PROTONIX Take 1 tablet (40 mg total) by mouth daily at 12 noon.   polyethylene glycol 17 g packet Commonly known as: MIRALAX / GLYCOLAX Take 17 g by mouth daily. Hold for diarrhoea   protein supplement 6 g Powd Commonly known as: RESOURCE BENEPROTEIN Take 2 Scoops (12 g total) by mouth 3 (three) times daily with meals.   Refresh Tears 0.5 % Soln Generic drug: carboxymethylcellulose Place 1 drop into both eyes 3 (three) times daily.            Durable Medical Equipment  (From admission, onward)         Start     Ordered   07/03/19 1418  For home use only DME Walker rolling  Once    Question Answer Comment  Walker: With [place]   Patient needs a walker to treat with the following condition Unsteady gait      07/03/19 1417   07/03/19 1418  For home use only DME 3 n 1  Once     07/03/19 1417          Contact information for follow-up providers    [facility] Follow up.   Why: rolling walker,bedside  commode Contact information: [place] [address] [phone]        Jovita Kussmaul, MD Follow up.   Specialty: General Surgery Why: Call for follow-up appointment 2 to 3 weeks after discharge from the hospital. Contact information: [place] [address] [phone]            Contact information for after-discharge care    Destination    [facility] Preferred SNF .   Service: Skilled Nursing Contact information: [place] [phone]                   Time coordinating discharge: 39 minutes  Signed:  Darika Ildefonso  [facility] 07/23/2019, 12:22 PM

## 2019-07-23 NOTE — TOC Progression Note (Signed)
Transition of Care [facility]) - Progression Note    Patient Details  Name: Gloria Murphy MRN: [id number] Date of Birth: April 01, 1936  [facility] CM/SW Contact  Purcell Mouton, RN Phone Number: 07/23/2019, 12:20 PM  Clinical Narrative:    Waiting for COVID test results. Insurance authorization [id number], [id number].     Expected Discharge Plan: Skilled Nursing Facility Barriers to Discharge: Continued Medical Work up  Expected Discharge Plan and Services Expected Discharge Plan: [place]   Discharge Planning Services: CM Consult   Living arrangements for the past 2 months: Single Family Home Expected Discharge Date: 07/23/19               DME Arranged: Berta Minor rolling DME Agency: [employer] Date DME Agency Contacted: 07/03/19 Time DME Agency Contacted: [phone] Representative spoke with at DME Agency: Thedore Mins HH Arranged: PT, OT           Social Determinants of Health ([place]) Interventions    Readmission Risk Interventions No flowsheet data found.

## 2019-07-23 NOTE — Progress Notes (Signed)
Daily Progress Note   Patient Name: Gloria Murphy       Date: 07/23/2019 DOB: 1935/09/08  Age: 84 y.o. MRN#: [id number] Attending Physician: Flora Lipps, MD Primary Care Physician: Carlyle Basques, MD Admit Date: 06/26/2019  Reason for Consultation/Follow-up: Establishing goals of care  Subjective: I saw and examined Ms. Gloria Murphy today. Her husband was not at the bedside at time of my visit.    Sitting up in bed, oral intake slowly but steadily improving. patient states she is trying to do her best.    Length of Stay: 27  Current Medications: Scheduled Meds:  . amLODipine  10 mg Oral Daily  . aspirin EC  81 mg Oral Daily  . bisacodyl  10 mg Rectal Daily  . carvedilol  6.25 mg Oral BID WC  . chlorhexidine  15 mL Mouth Rinse BID  . Chlorhexidine Gluconate Cloth  6 each Topical Daily  . enoxaparin (LOVENOX) injection  40 mg Subcutaneous Q24H  . feeding supplement  1 Container Oral TID BM  . insulin aspart  0-15 Units Subcutaneous Q6H  . lip balm  1 application Topical BID  . magnesium oxide  400 mg Oral BID  . mouth rinse  15 mL Mouth Rinse q12n4p  . megestrol  400 mg Oral BID  . metoCLOPramide (REGLAN) injection  5 mg Intravenous Q8H  . pantoprazole  40 mg Oral Q1200  . polyethylene glycol  17 g Oral Daily  . polyvinyl alcohol  1 drop Both Eyes TID  . protein supplement  2 Scoop Oral TID WC  . sodium chloride flush  10-40 mL Intracatheter Q12H  . sodium chloride flush  3 mL Intravenous Q12H    Continuous Infusions:   PRN Meds: acetaminophen, alum & mag hydroxide-simeth, guaiFENesin, labetalol, magic mouthwash, ondansetron (ZOFRAN) IV, phenol, Resource ThickenUp Clear, sodium chloride flush, sodium chloride flush  Physical Exam     -awake, opens eyes, responds to  questions appropriately, chronically-ill appearing -Regular rate and rhythm -diminished bilaterally -follows commands   Vital Signs: BP 113/69 (BP Location: Left Arm)   Pulse 91   Temp 98.5 F (36.9 C) (Oral)   Resp 20   Ht 5\' 9"  (1.753 m)   Wt 66.8 kg   SpO2 98%   BMI 21.74 kg/m  SpO2: SpO2: 98 % O2 Device: O2  Device: Room Air O2 Flow Rate: O2 Flow Rate (L/min): 2 L/min  Intake/output summary:   Intake/Output Summary (Last 24 hours) at 07/23/2019 1317 Last data filed at 07/23/2019 0830 Gross per 24 hour  Intake 926.2 ml  Output 150 ml  Net 776.2 ml   LBM: Last BM Date: 07/22/19 Baseline Weight: Weight: 66.1 kg Most recent weight: Weight: 66.8 kg       Palliative Assessment/Data: TPN     Flowsheet Rows     Most Recent Value  Intake Tab  Referral Department  Hospitalist  Unit at Time of Referral  -- [urology/telementry]  Palliative Care Primary Diagnosis  Cancer  Date Notified  07/12/19  Palliative Care Type  New Palliative care  Reason for referral  Clarify Goals of Care  Date of Admission  06/26/19  Date first seen by Palliative Care  07/13/19  # of days Palliative referral response time  1 Day(s)  # of days IP prior to Palliative referral  16  Clinical Assessment  Psychosocial & Spiritual Assessment  Palliative Care Outcomes      Patient Active Problem List   Diagnosis Date Noted  . Pressure injury of skin 07/16/2019  . Dysphagia, oropharyngeal 07/13/2019  . FTT (failure to thrive) in adult   . Severe protein-calorie malnutrition ([place])   . Palliative care encounter   . Sinus tachycardia 06/27/2019  . Type 2 diabetes mellitus without complication, without long-term current use of insulin ([place]) 06/26/2019  . SBO (small bowel obstruction) s/p adhesiolysis 07/01/2019 06/26/2019  . AKI (acute kidney injury) ([place]) 06/26/2019  . Nausea and vomiting 06/26/2019  . Abdominal pain 06/26/2019  . Lactic acidosis 06/26/2019  . Elevated troponin 06/26/2019  .  Essential hypertension 02/22/2019  . Exertional dyspnea 02/04/2019  . Vision disturbance 12/19/2018  . TIA (transient ischemic attack) 12/19/2018  . Malignant carcinoid tumor to left adnexa 10/06/2017  . Elevated CA-125 08/31/2017    Palliative Care Assessment & Plan   Recommendations/Plan:  DNR/DNI Still limited in her intake but with ongoing PO  improvement   Goals of Care and Additional Recommendations:  Limitations on Scope of Treatment: Full Scope Treatment  Code Status:    Code Status Orders  (From admission, onward)         Start     Ordered   07/05/19 0754  Do not attempt resuscitation (DNR)  Continuous    Question Answer Comment  In the event of cardiac or respiratory ARREST Do not call a "code blue"   In the event of cardiac or respiratory ARREST Do not perform Intubation, CPR, defibrillation or ACLS   In the event of cardiac or respiratory ARREST Use medication by any route, position, wound care, and other measures to relive pain and suffering. May use oxygen, suction and manual treatment of airway obstruction as needed for comfort.      07/05/19 0754        Code Status History    Date Active Date Inactive Code Status Order ID Comments User Context   06/26/2019 1957 07/05/2019 0754 Full Code [id number]  Rhetta Mura DO ED   08/31/2017 1650 09/05/2017 1751 Full Code [id number]  Lahoma Crocker, MD Inpatient   Advance Care Planning Activity       Prognosis:   Guarded  Discharge Planning:  To be d/c today.   Care plan was discussed with patient and RN.  Thank you for allowing the Palliative Medicine Team to assist in the care of this patient.  Time Total:  25 min.   Visit consisted of counseling and education dealing with the complex and emotionally intense issues of symptom management and palliative care in the setting of serious and potentially life-threatening illness.Greater than 50%  of this time was spent counseling and coordinating care  related to the above assessment and plan.  Loistine Chance, MD [place] Team [phone]  Please contact Palliative Medicine Team phone at [phone] for questions and concerns.

## 2019-07-23 NOTE — Progress Notes (Signed)
NUTRITION NOTE  Please see note from RN yesterday at 1759 for detailed report of intakes at each meal.  Breakfast- 340 kcal, 8 grams protein Lunch- bites and sips Dinner- 143 kcal, 7 grams protein   Able to talk with care team yesterday and the decision was made to stop TPN as it would not provide long-term benefit or improve outcomes for this patient.   Megace was started on 3/12 and continues at this time. Intakes have improved each day since 3/13. It is unlikely that patient will ever be able to fully meet nutrition needs.     Jarome Matin, MS, RD, LDN, CNSC Inpatient Clinical Dietitian RD pager # available in [facility]  After hours/weekend pager # available in [facility]

## 2019-07-23 NOTE — Progress Notes (Signed)
Report called to [facility] at [facility], pt prepared for transport. Husband at bedside. Pt stable conditon, dentures sent with patient along with persona belongings. SRP, RN

## 2019-07-23 NOTE — Progress Notes (Signed)
Pt leaving for [facility], daughter Silva Bandy notified of transfer. Pt stable. FYI: lower denture in patient mouth, pt removed upper denture, which was place in denture cup in pt belonging bag. Pt left with black glasses in her hand and all her belonging bags.SRP, RN

## 2019-07-26 DIAGNOSIS — J690 Pneumonitis due to inhalation of food and vomit: Secondary | ICD-10-CM | POA: Diagnosis not present

## 2019-07-26 DIAGNOSIS — D3A00 Benign carcinoid tumor of unspecified site: Secondary | ICD-10-CM | POA: Diagnosis not present

## 2019-07-26 DIAGNOSIS — K56609 Unspecified intestinal obstruction, unspecified as to partial versus complete obstruction: Secondary | ICD-10-CM | POA: Diagnosis not present

## 2019-07-26 DIAGNOSIS — J9601 Acute respiratory failure with hypoxia: Secondary | ICD-10-CM | POA: Diagnosis not present

## 2019-08-07 DIAGNOSIS — E43 Unspecified severe protein-calorie malnutrition: Secondary | ICD-10-CM | POA: Diagnosis not present

## 2019-08-07 DIAGNOSIS — L89152 Pressure ulcer of sacral region, stage 2: Secondary | ICD-10-CM | POA: Diagnosis not present

## 2019-08-07 DIAGNOSIS — E785 Hyperlipidemia, unspecified: Secondary | ICD-10-CM | POA: Diagnosis not present

## 2019-08-07 DIAGNOSIS — I10 Essential (primary) hypertension: Secondary | ICD-10-CM | POA: Diagnosis not present

## 2019-08-07 DIAGNOSIS — R1310 Dysphagia, unspecified: Secondary | ICD-10-CM | POA: Diagnosis not present

## 2019-08-07 DIAGNOSIS — E119 Type 2 diabetes mellitus without complications: Secondary | ICD-10-CM | POA: Diagnosis not present

## 2019-08-07 DIAGNOSIS — E039 Hypothyroidism, unspecified: Secondary | ICD-10-CM | POA: Diagnosis not present

## 2019-08-07 DIAGNOSIS — E1169 Type 2 diabetes mellitus with other specified complication: Secondary | ICD-10-CM | POA: Diagnosis not present

## 2019-08-08 ENCOUNTER — Ambulatory Visit: Payer: Self-pay

## 2019-08-08 DIAGNOSIS — I10 Essential (primary) hypertension: Secondary | ICD-10-CM | POA: Diagnosis not present

## 2019-08-08 DIAGNOSIS — L89152 Pressure ulcer of sacral region, stage 2: Secondary | ICD-10-CM | POA: Diagnosis not present

## 2019-08-08 DIAGNOSIS — R1310 Dysphagia, unspecified: Secondary | ICD-10-CM | POA: Diagnosis not present

## 2019-08-08 DIAGNOSIS — E43 Unspecified severe protein-calorie malnutrition: Secondary | ICD-10-CM | POA: Diagnosis not present

## 2019-08-08 DIAGNOSIS — E119 Type 2 diabetes mellitus without complications: Secondary | ICD-10-CM | POA: Diagnosis not present

## 2019-08-08 NOTE — Telephone Encounter (Signed)
Patient called to schedule 2nd vaccine appointment. Then Patient decided she did not want to keep that appointment.  Will call back.

## 2019-08-09 DIAGNOSIS — E119 Type 2 diabetes mellitus without complications: Secondary | ICD-10-CM | POA: Diagnosis not present

## 2019-08-09 DIAGNOSIS — E43 Unspecified severe protein-calorie malnutrition: Secondary | ICD-10-CM | POA: Diagnosis not present

## 2019-08-09 DIAGNOSIS — R1310 Dysphagia, unspecified: Secondary | ICD-10-CM | POA: Diagnosis not present

## 2019-08-09 DIAGNOSIS — I10 Essential (primary) hypertension: Secondary | ICD-10-CM | POA: Diagnosis not present

## 2019-08-09 DIAGNOSIS — L89152 Pressure ulcer of sacral region, stage 2: Secondary | ICD-10-CM | POA: Diagnosis not present

## 2019-08-12 DIAGNOSIS — E039 Hypothyroidism, unspecified: Secondary | ICD-10-CM | POA: Diagnosis not present

## 2019-08-12 DIAGNOSIS — I10 Essential (primary) hypertension: Secondary | ICD-10-CM | POA: Diagnosis not present

## 2019-08-12 DIAGNOSIS — E43 Unspecified severe protein-calorie malnutrition: Secondary | ICD-10-CM | POA: Diagnosis not present

## 2019-08-12 DIAGNOSIS — L89152 Pressure ulcer of sacral region, stage 2: Secondary | ICD-10-CM | POA: Diagnosis not present

## 2019-08-12 DIAGNOSIS — E119 Type 2 diabetes mellitus without complications: Secondary | ICD-10-CM | POA: Diagnosis not present

## 2019-08-12 DIAGNOSIS — R739 Hyperglycemia, unspecified: Secondary | ICD-10-CM | POA: Diagnosis not present

## 2019-08-12 DIAGNOSIS — R1310 Dysphagia, unspecified: Secondary | ICD-10-CM | POA: Diagnosis not present

## 2019-08-13 ENCOUNTER — Ambulatory Visit: Payer: Medicare Other

## 2019-08-13 DIAGNOSIS — I10 Essential (primary) hypertension: Secondary | ICD-10-CM | POA: Diagnosis not present

## 2019-08-13 DIAGNOSIS — E43 Unspecified severe protein-calorie malnutrition: Secondary | ICD-10-CM | POA: Diagnosis not present

## 2019-08-13 DIAGNOSIS — E119 Type 2 diabetes mellitus without complications: Secondary | ICD-10-CM | POA: Diagnosis not present

## 2019-08-13 DIAGNOSIS — R1310 Dysphagia, unspecified: Secondary | ICD-10-CM | POA: Diagnosis not present

## 2019-08-13 DIAGNOSIS — S31809A Unspecified open wound of unspecified buttock, initial encounter: Secondary | ICD-10-CM | POA: Diagnosis not present

## 2019-08-13 DIAGNOSIS — L89152 Pressure ulcer of sacral region, stage 2: Secondary | ICD-10-CM | POA: Diagnosis not present

## 2019-08-14 DIAGNOSIS — I10 Essential (primary) hypertension: Secondary | ICD-10-CM | POA: Diagnosis not present

## 2019-08-14 DIAGNOSIS — E119 Type 2 diabetes mellitus without complications: Secondary | ICD-10-CM | POA: Diagnosis not present

## 2019-08-14 DIAGNOSIS — E43 Unspecified severe protein-calorie malnutrition: Secondary | ICD-10-CM | POA: Diagnosis not present

## 2019-08-14 DIAGNOSIS — L89152 Pressure ulcer of sacral region, stage 2: Secondary | ICD-10-CM | POA: Diagnosis not present

## 2019-08-14 DIAGNOSIS — R1310 Dysphagia, unspecified: Secondary | ICD-10-CM | POA: Diagnosis not present

## 2019-08-15 DIAGNOSIS — E43 Unspecified severe protein-calorie malnutrition: Secondary | ICD-10-CM | POA: Diagnosis not present

## 2019-08-15 DIAGNOSIS — L89152 Pressure ulcer of sacral region, stage 2: Secondary | ICD-10-CM | POA: Diagnosis not present

## 2019-08-15 DIAGNOSIS — R1310 Dysphagia, unspecified: Secondary | ICD-10-CM | POA: Diagnosis not present

## 2019-08-15 DIAGNOSIS — I10 Essential (primary) hypertension: Secondary | ICD-10-CM | POA: Diagnosis not present

## 2019-08-15 DIAGNOSIS — E119 Type 2 diabetes mellitus without complications: Secondary | ICD-10-CM | POA: Diagnosis not present

## 2019-08-16 DIAGNOSIS — L89152 Pressure ulcer of sacral region, stage 2: Secondary | ICD-10-CM | POA: Diagnosis not present

## 2019-08-16 DIAGNOSIS — E43 Unspecified severe protein-calorie malnutrition: Secondary | ICD-10-CM | POA: Diagnosis not present

## 2019-08-16 DIAGNOSIS — E119 Type 2 diabetes mellitus without complications: Secondary | ICD-10-CM | POA: Diagnosis not present

## 2019-08-16 DIAGNOSIS — I10 Essential (primary) hypertension: Secondary | ICD-10-CM | POA: Diagnosis not present

## 2019-08-16 DIAGNOSIS — R1310 Dysphagia, unspecified: Secondary | ICD-10-CM | POA: Diagnosis not present

## 2019-08-19 DIAGNOSIS — E119 Type 2 diabetes mellitus without complications: Secondary | ICD-10-CM | POA: Diagnosis not present

## 2019-08-19 DIAGNOSIS — E43 Unspecified severe protein-calorie malnutrition: Secondary | ICD-10-CM | POA: Diagnosis not present

## 2019-08-19 DIAGNOSIS — L89152 Pressure ulcer of sacral region, stage 2: Secondary | ICD-10-CM | POA: Diagnosis not present

## 2019-08-19 DIAGNOSIS — R1310 Dysphagia, unspecified: Secondary | ICD-10-CM | POA: Diagnosis not present

## 2019-08-19 DIAGNOSIS — I10 Essential (primary) hypertension: Secondary | ICD-10-CM | POA: Diagnosis not present

## 2019-08-20 DIAGNOSIS — E119 Type 2 diabetes mellitus without complications: Secondary | ICD-10-CM | POA: Diagnosis not present

## 2019-08-20 DIAGNOSIS — E43 Unspecified severe protein-calorie malnutrition: Secondary | ICD-10-CM | POA: Diagnosis not present

## 2019-08-20 DIAGNOSIS — R1310 Dysphagia, unspecified: Secondary | ICD-10-CM | POA: Diagnosis not present

## 2019-08-20 DIAGNOSIS — L89152 Pressure ulcer of sacral region, stage 2: Secondary | ICD-10-CM | POA: Diagnosis not present

## 2019-08-20 DIAGNOSIS — I10 Essential (primary) hypertension: Secondary | ICD-10-CM | POA: Diagnosis not present

## 2019-08-21 DIAGNOSIS — E43 Unspecified severe protein-calorie malnutrition: Secondary | ICD-10-CM | POA: Diagnosis not present

## 2019-08-21 DIAGNOSIS — L89152 Pressure ulcer of sacral region, stage 2: Secondary | ICD-10-CM | POA: Diagnosis not present

## 2019-08-21 DIAGNOSIS — E119 Type 2 diabetes mellitus without complications: Secondary | ICD-10-CM | POA: Diagnosis not present

## 2019-08-21 DIAGNOSIS — R1310 Dysphagia, unspecified: Secondary | ICD-10-CM | POA: Diagnosis not present

## 2019-08-21 DIAGNOSIS — I10 Essential (primary) hypertension: Secondary | ICD-10-CM | POA: Diagnosis not present

## 2019-08-22 DIAGNOSIS — I10 Essential (primary) hypertension: Secondary | ICD-10-CM | POA: Diagnosis not present

## 2019-08-22 DIAGNOSIS — E119 Type 2 diabetes mellitus without complications: Secondary | ICD-10-CM | POA: Diagnosis not present

## 2019-08-22 DIAGNOSIS — L89152 Pressure ulcer of sacral region, stage 2: Secondary | ICD-10-CM | POA: Diagnosis not present

## 2019-08-22 DIAGNOSIS — R1310 Dysphagia, unspecified: Secondary | ICD-10-CM | POA: Diagnosis not present

## 2019-08-22 DIAGNOSIS — E43 Unspecified severe protein-calorie malnutrition: Secondary | ICD-10-CM | POA: Diagnosis not present

## 2019-08-26 DIAGNOSIS — E119 Type 2 diabetes mellitus without complications: Secondary | ICD-10-CM | POA: Diagnosis not present

## 2019-08-26 DIAGNOSIS — E43 Unspecified severe protein-calorie malnutrition: Secondary | ICD-10-CM | POA: Diagnosis not present

## 2019-08-26 DIAGNOSIS — L89152 Pressure ulcer of sacral region, stage 2: Secondary | ICD-10-CM | POA: Diagnosis not present

## 2019-08-26 DIAGNOSIS — R1310 Dysphagia, unspecified: Secondary | ICD-10-CM | POA: Diagnosis not present

## 2019-08-26 DIAGNOSIS — I10 Essential (primary) hypertension: Secondary | ICD-10-CM | POA: Diagnosis not present

## 2019-08-30 ENCOUNTER — Ambulatory Visit (INDEPENDENT_AMBULATORY_CARE_PROVIDER_SITE_OTHER): Payer: Medicare Other | Admitting: Internal Medicine

## 2019-08-30 ENCOUNTER — Other Ambulatory Visit: Payer: Self-pay

## 2019-08-30 ENCOUNTER — Encounter: Payer: Self-pay | Admitting: Internal Medicine

## 2019-08-30 VITALS — BP 118/72 | HR 79 | Temp 97.8°F | Ht 69.0 in | Wt 155.0 lb

## 2019-08-30 DIAGNOSIS — R627 Adult failure to thrive: Secondary | ICD-10-CM | POA: Diagnosis not present

## 2019-08-30 DIAGNOSIS — I10 Essential (primary) hypertension: Secondary | ICD-10-CM | POA: Diagnosis not present

## 2019-08-30 DIAGNOSIS — K56609 Unspecified intestinal obstruction, unspecified as to partial versus complete obstruction: Secondary | ICD-10-CM | POA: Diagnosis not present

## 2019-08-30 DIAGNOSIS — C7A00 Malignant carcinoid tumor of unspecified site: Secondary | ICD-10-CM | POA: Diagnosis not present

## 2019-08-30 DIAGNOSIS — E43 Unspecified severe protein-calorie malnutrition: Secondary | ICD-10-CM

## 2019-08-30 DIAGNOSIS — E119 Type 2 diabetes mellitus without complications: Secondary | ICD-10-CM

## 2019-08-30 NOTE — Progress Notes (Signed)
Provider:  Rexene Edison. Mariea Clonts, D.O., C.M.D. Location:   [place]  Place of Service:   clinic  Previous PCP: Gayland Curry, DO Patient Care Team: Gayland Curry, DO as PCP - General (Geriatric Medicine) Marcial Pacas, MD as Consulting Physician (Neurology) Nigel Mormon, MD as Consulting Physician (Cardiology) Everitt Amber, MD as Consulting Physician (Gynecologic Oncology) Juanita Craver, MD as Consulting Physician (Gastroenterology)  Extended Emergency Contact Information Primary Emergency Contact: Marissa, Rassel Mobile Phone: [phone] Relation: Spouse Secondary Emergency Contact: Pratt,Sandra Address: [address]          [place], [place] [address] Johnnette Litter of [place] Phone: [phone] Mobile Phone: [phone] Relation: Daughter  Code Status: DNR Goals of Care: Advanced Directive information Advanced Directives 06/26/2019  Does Patient Have a Medical Advance Directive? -  Type of Advance Directive -  Does patient want to make changes to medical advance directive? -  Copy of [place] in Chart? -  Would patient like information on creating a medical advance directive? No - Patient declined   Chief Complaint  Patient presents with  . Establish Care    New patient to establish care.     HPI: Patient is a 84 y.o. female seen today to establish with [facility].  Records will be requested from Dr. Sheilah Mins office.  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  She is here at this appt with her daughter, who is up from [place], and her husband, Diona Fanti.  Alver Sorrow is the eldest, [name] is the health care POA and Lyn Hollingshead will be coming with her in the future.  She recently was hospitalized with SBO due to adhesions related to her prior surgery for a carcinoid tumor in her pelvis.  She did not do very well with  this hospitalization--intake was poor, she lost a tremendous amt of weight at the hospital and then at rehab, she had an episode of aspiration pneumonia due to dysphagia.    She was on TPN during her hospital stay until megace was started.  She was followed by palliative care during her hospitalization and they discussed code status which was changed to DNR/DNI and they weaned her TPN.  Hypertension:  Has been controlled lately with the medications.  She is on a low sodium diet also.  BP great today.  Sometimes dizzy or lightheaded.  Appetite had been really poor--stopped in the hospital.  Megace is making her very hungry.  They wondered if she could come off of it.  It was a problem with bad tasting food at [facility].    Dysphagia:  Has had speech therapy with [facility] thru last week.  Did ok at the hospital with swallowing.  Taste has returned.  Had an episode of aspiration at the hospital.    PT is still working with her thru May 10th.  She is using a walker just since this last hospital stay.    DMII:  Has had diabetes a long time.  CBGs each morning had been 100-112.  Yesterday was 132 before breakfast.  Day before was after meals 258.  Not on medicine for diabetes.  She was taken off.  Weight had gone all the way down to 120s at [facility]. 147 on 3/16.   4/5, hba1c 5.6.    Pelvic carcinoid tumor:  One of her specialists found her abdomen looking funny  and discovered the tumor.  She had the surgery for the tumor.  She did not have any metastatic disease.    Hyperlipidemia:  Not on meds for this any longer.    She stopped mammograms way back in 2011.  Her daughter asked if that should be resumed and we discussed risks and benefits and they decided not to pursue them any further with all else she has had going on lately.  Bowels are moving too much.  Taking miralax daily.  Has one bm per day.  Sometimes has to hurry to get there in time.    She's on magox but not sure if low level or for  bowels.    No indigestion.  On protonix.    Has dry eyes.  Uses refresh tears.  Dr. Herbert Deaner did a surgery that helped her see better.    On peridex twice a day due to the aspiration--then to use regular mouthwash.  Taking a glucerna protein supplement.    They are concerned about her memory.    Past Medical History:  Diagnosis Date  . Diabetes mellitus without complication ([place])   . High cholesterol   . Hypertension   . Thyroid disease    Past Surgical History:  Procedure Laterality Date  . ABDOMINAL HYSTERECTOMY    . EYE SURGERY Bilateral 06/2016   Cataract  . KNEE SURGERY Right   . LAPAROTOMY N/A 08/31/2017   Procedure: EXPLORATORY LAPAROTOMY; BILATERAL SALPINGOOPHERECTOMY; OMENTECTOMY, TUMOR DEBULKING;  Surgeon: Everitt Amber, MD;  Location: [facility];  Service: Gynecology;  Laterality: N/A;  . LAPAROTOMY N/A 07/01/2019   Procedure: EXPLORATORY LAPAROTOMY;  Surgeon: Jovita Kussmaul, MD;  Location: [facility];  Service: General;  Laterality: N/A;  . MASS EXCISION N/A 08/31/2017   Procedure: RESECTION OF PELVIC MASS;  Surgeon: Everitt Amber, MD;  Location: [facility];  Service: Gynecology;  Laterality: N/A;  . RIGHT HEART CATH N/A 02/11/2019   Procedure: RIGHT HEART CATH;  Surgeon: Nigel Mormon, MD;  Location: [facility];  Service: Cardiovascular;  Laterality: N/A;  . SMALL INTESTINE SURGERY      Social History   Socioeconomic History  . Marital status: Married    Spouse name: Not on file  . Number of children: 3  . Years of education: 46  . Highest education level: High school graduate  Occupational History  . Occupation: Retired  Tobacco Use  . Smoking status: Never Smoker  . Smokeless tobacco: Never Used  Substance and Sexual Activity  . Alcohol use: No  . Drug use: No  . Sexual activity: Not on file  Other Topics Concern  . Not on file  Social History Narrative   Lives at home with her husband.   Right-handed.   Occasional caffeine.   Social Determinants of  Health   Financial Resource Strain:   . Difficulty of Paying Living Expenses:   Food Insecurity:   . Worried About Charity fundraiser in the Last Year:   . Arboriculturist in the Last Year:   Transportation Needs:   . Film/video editor (Medical):   [name] Lack of Transportation (Non-Medical):   Physical Activity:   . Days of Exercise per Week:   . Minutes of Exercise per Session:   Stress:   . Feeling of Stress :   Social Connections:   . Frequency of Communication with Friends and Family:   . Frequency of Social Gatherings with Friends and Family:   . Attends Religious  Services:   . Active Member of Clubs or Organizations:   . Attends Archivist Meetings:   [name] Marital Status:     reports that she has never smoked. She has never used smokeless tobacco. She reports that she does not drink alcohol or use drugs.  Functional Status Survey:    Family History  Problem Relation Age of Onset  . Heart disease Mother   . Diabetes Mother   . Kidney failure Father   . Breast cancer Neg Hx     Health Maintenance  Topic Date Due  . OPHTHALMOLOGY EXAM  Never done  . URINE MICROALBUMIN  Never done  . TETANUS/TDAP  Never done  . DEXA SCAN  Never done  . PNA vac Low Risk Adult (1 of 2 - PCV13) Never done  . INFLUENZA VACCINE  12/08/2019  . HEMOGLOBIN A1C  12/25/2019  . FOOT EXAM  08/29/2020  . COVID-19 Vaccine  Completed    No Known Allergies  Outpatient Encounter Medications as of 08/30/2019  Medication Sig  . amLODipine (NORVASC) 10 MG tablet Take 1 tablet (10 mg total) by mouth daily.  [name] aspirin EC 81 MG tablet Take 1 tablet (81 mg total) by mouth daily. (Patient not taking: Reported on 06/26/2019)  . Calcium Carbonate (CALTRATE 600 PO) Take 1 tablet by mouth daily.  . carboxymethylcellulose (REFRESH TEARS) 0.5 % SOLN Place 1 drop into both eyes 3 (three) times daily.  . carvedilol (COREG) 6.25 MG tablet Take 1 tablet (6.25 mg total) by mouth 2 (two) times daily with  a meal.  . chlorhexidine (PERIDEX) 0.12 % solution 15 mLs by Mouth Rinse route 2 (two) times daily.  [name] guaiFENesin (ROBITUSSIN) 100 MG/5ML SOLN Take 5 mLs (100 mg total) by mouth every 4 (four) hours as needed for cough or to loosen phlegm.  . magnesium oxide (MAG-OX) 400 (241.3 Mg) MG tablet Take 1 tablet (400 mg total) by mouth 2 (two) times daily.  . megestrol (MEGACE) 400 MG/10ML suspension Take 10 mLs (400 mg total) by mouth 2 (two) times daily.  . ondansetron (ZOFRAN) 4 MG tablet Take 1 tablet (4 mg total) by mouth every 6 (six) hours.  . pantoprazole (PROTONIX) 40 MG tablet Take 1 tablet (40 mg total) by mouth daily at 12 noon.  . polyethylene glycol (MIRALAX / GLYCOLAX) 17 g packet Take 17 g by mouth daily. Hold for diarrhoea  . protein supplement (RESOURCE BENEPROTEIN) 6 g POWD Take 2 Scoops (12 g total) by mouth 3 (three) times daily with meals.  . [DISCONTINUED] alum & mag hydroxide-simeth (MAALOX/MYLANTA) 200-200-20 MG/5ML suspension Take 30 mLs by mouth every 6 (six) hours as needed for indigestion or heartburn (or bloating).  . [DISCONTINUED] bisacodyl (DULCOLAX) 10 MG suppository Place 1 suppository (10 mg total) rectally daily as needed for moderate constipation or severe constipation.   No facility-administered encounter medications on file as of 08/30/2019.    Review of Systems  Constitutional: Positive for malaise/fatigue and weight loss. Negative for chills and fever.  HENT: Positive for hearing loss. Negative for sore throat.        Hearing aid; sinus problems  Eyes: Positive for blurred vision.       Dry eyes  Respiratory: Negative for cough, sputum production and shortness of breath.   Cardiovascular: Negative for chest pain, palpitations and leg swelling.       HTN  Gastrointestinal: Positive for constipation. Negative for abdominal pain, blood in stool, diarrhea and melena.  Dysphagia  Genitourinary: Negative for dysuria.  Musculoskeletal: Negative for falls  and joint pain.  Skin: Negative for itching and rash.  Neurological: Positive for weakness. Negative for dizziness.       Deconditioning, loss of balance  Endo/Heme/Allergies:       Diabetes  Psychiatric/Behavioral: Positive for memory loss. Negative for depression. The patient is not nervous/anxious and does not have insomnia.     Vitals:   08/30/19 0933  Weight: 155 lb (70.3 kg)  Height: 5\' 9"  (1.753 m)   Body mass index is 22.89 kg/m. Physical Exam Vitals reviewed.  Constitutional:      General: She is not in acute distress.    Appearance: She is not toxic-appearing.     Comments: Frail female seated in chair, ambulates with rolling walker   HENT:     Head: Normocephalic and atraumatic.     Ears:     Comments: Hearing aid    Nose: Nose normal.     Mouth/Throat:     Pharynx: Oropharynx is clear.  Eyes:     Extraocular Movements: Extraocular movements intact.     Conjunctiva/sclera: Conjunctivae normal.     Pupils: Pupils are equal, round, and reactive to light.     Comments: glasses  Cardiovascular:     Rate and Rhythm: Normal rate and regular rhythm.     Pulses: Normal pulses.     Heart sounds: Normal heart sounds.  Pulmonary:     Effort: Pulmonary effort is normal.     Breath sounds: Normal breath sounds. No wheezing, rhonchi or rales.  Abdominal:     General: Bowel sounds are normal. There is no distension.     Palpations: Abdomen is soft. There is no mass.     Tenderness: There is no abdominal tenderness. There is no guarding or rebound.  Musculoskeletal:        General: Normal range of motion.     Cervical back: Neck supple.     Right lower leg: No edema.     Left lower leg: No edema.  Skin:    General: Skin is warm and dry.  Neurological:     Gait: Gait abnormal.     Comments: Some short-term memory loss  Psychiatric:        Mood and Affect: Mood normal.     Labs reviewed: Basic Metabolic Panel: Recent Labs    07/20/19 0443 07/21/19 2333  07/22/19 0828  NA 134* 135 135  K 3.7 3.5 3.6  CL 103 105 106  CO2 22 22 21*  GLUCOSE 151* 103* 115*  BUN 25* 23 21  CREATININE 0.69 0.58 0.62  CALCIUM 8.7* 8.5* 8.6*  MG 2.0 1.9 1.6*  PHOS 3.1 2.8 2.8   Liver Function Tests: Recent Labs    07/19/19 0500 07/21/19 2333 07/22/19 0828  AST 77* 55* 51*  ALT 84* 63* 63*  ALKPHOS 382* 320* 322*  BILITOT 0.7 0.5 0.5  PROT 7.1 7.0 7.1  ALBUMIN 2.0* 1.9* 2.0*   Recent Labs    06/26/19 1352  LIPASE 38   Recent Labs    07/13/19 1600  AMMONIA 16   CBC: Recent Labs    07/08/19 0500 07/09/19 0500 07/15/19 0430 07/16/19 0415 07/18/19 0449 07/19/19 0500 07/22/19 0330  WBC 6.6   < > 5.4   < > 4.2 5.0 4.4  NEUTROABS 5.3  --  4.1  --   --   --  2.6  HGB 8.5*   < > 8.7*   < >  7.9* 8.1* 7.7*  HCT 26.4*   < > 26.8*   < > 24.6* 25.7* 24.6*  MCV 80.0   < > 80.0   < > 79.1* 80.1 79.6*  PLT 242   < > 342   < > 344 363 326   < > = values in this interval not displayed.   Cardiac Enzymes: No results for input(s): CKTOTAL, CKMB, CKMBINDEX, TROPONINI in the last 8760 hours. BNP: Invalid input(s): POCBNP Lab Results  Component Value Date   HGBA1C 6.3 (H) 06/27/2019   Lab Results  Component Value Date   TSH 0.031 (L) 07/18/2019   Lab Results  Component Value Date   VITAMINB12 550 12/19/2018   No results found for: FOLATE No results found for: IRON, TIBC, FERRITIN  Imaging and Procedures noted on new patient packet: Colonoscopy 2020 Mammogram last in 2011 CT of abd/pelvis describing mass prior to resection in March of 2019:  Complex solid mass arising from the midline of the pelvis measuring 21.3 cm from superior to inferior dimension, 17.2 cm from right to left dimension, and 15.3 cm from anterior to posterior dimension. The appearance of this mass suggests that it may represent a large uterine leiomyoma with possible sarcomatous degeneration. Given the history of previous hysterectomy, an atypical presentation of  ovarian neoplasm must be a differential consideration. Given concern for potential neoplasm or neoplastic degeneration, gynecologic consultation advised. A small amount of fluid is noted between this mass in the rectum which may well either have sympathetic or post inflammatory etiology. This mass impresses upon the superior aspect of the urinary bladder but does not invade the bladder. Note that normal ovaries are not visualized. CA 125 at that time was 78.1  Assessment/Plan 1. Type 2 diabetes mellitus without complication, without long-term current use of insulin ([place]) -very well controlled as of late with poor intake -just had hba1c early this month with prior PCP -will obtain records and plan to recheck in 3-4 mos  2. SBO (small bowel obstruction) s/p adhesiolysis 07/01/2019 -seems to gradually be recovering -we'll have to see how she does with intake off megace  3. Essential hypertension -bp well controlled, monitor  4. Malignant carcinoid tumor to left adnexa -s/p ex lap, removal of pelvic tumor, appears she also had BSO and omentectomy/tumor debulking (08/31/17) with Dr. Denman George  -reportedly no mets   5. FTT (failure to thrive) in adult -improving  -still requiring assistance with her daily activities including meds, dressing, preparing food, but eating on her own now -also has memory loss we will evaluate more at the next visit  6. Severe protein-calorie malnutrition ([place]) Decrease megace to once daily for one week, then d/c megace due to great appetite now and gaining weight very quickly and risk of DVT/PE  Labs/tests ordered:  No new today--get last labs from early this month with Dr. Criss Rosales  09/12/2019 f/u for MMSE and good PE  Lainie Daubert L. Tywana Robotham, D.O. [facility] [address], [place] [address] Cell Phone (Mon-Fri 8am-5pm):  [phone] On Call:  [phone] & follow prompts after 5pm & weekends Office Phone:   [phone] Office Fax:  [phone]

## 2019-08-30 NOTE — Patient Instructions (Signed)
Decrease megace to 2ml daily for one week, then stop.

## 2019-09-03 DIAGNOSIS — L89152 Pressure ulcer of sacral region, stage 2: Secondary | ICD-10-CM | POA: Diagnosis not present

## 2019-09-03 DIAGNOSIS — R1310 Dysphagia, unspecified: Secondary | ICD-10-CM | POA: Diagnosis not present

## 2019-09-03 DIAGNOSIS — G9341 Metabolic encephalopathy: Secondary | ICD-10-CM | POA: Diagnosis not present

## 2019-09-03 DIAGNOSIS — C7A00 Malignant carcinoid tumor of unspecified site: Secondary | ICD-10-CM | POA: Diagnosis not present

## 2019-09-03 DIAGNOSIS — I10 Essential (primary) hypertension: Secondary | ICD-10-CM | POA: Diagnosis not present

## 2019-09-03 DIAGNOSIS — E43 Unspecified severe protein-calorie malnutrition: Secondary | ICD-10-CM | POA: Diagnosis not present

## 2019-09-03 DIAGNOSIS — K56609 Unspecified intestinal obstruction, unspecified as to partial versus complete obstruction: Secondary | ICD-10-CM | POA: Diagnosis not present

## 2019-09-03 DIAGNOSIS — R2689 Other abnormalities of gait and mobility: Secondary | ICD-10-CM | POA: Diagnosis not present

## 2019-09-03 DIAGNOSIS — Z48815 Encounter for surgical aftercare following surgery on the digestive system: Secondary | ICD-10-CM | POA: Diagnosis not present

## 2019-09-03 DIAGNOSIS — E119 Type 2 diabetes mellitus without complications: Secondary | ICD-10-CM | POA: Diagnosis not present

## 2019-09-05 DIAGNOSIS — K56609 Unspecified intestinal obstruction, unspecified as to partial versus complete obstruction: Secondary | ICD-10-CM | POA: Diagnosis not present

## 2019-09-05 DIAGNOSIS — L89152 Pressure ulcer of sacral region, stage 2: Secondary | ICD-10-CM | POA: Diagnosis not present

## 2019-09-05 DIAGNOSIS — E119 Type 2 diabetes mellitus without complications: Secondary | ICD-10-CM | POA: Diagnosis not present

## 2019-09-05 DIAGNOSIS — I10 Essential (primary) hypertension: Secondary | ICD-10-CM | POA: Diagnosis not present

## 2019-09-05 DIAGNOSIS — R2689 Other abnormalities of gait and mobility: Secondary | ICD-10-CM | POA: Diagnosis not present

## 2019-09-05 DIAGNOSIS — Z48815 Encounter for surgical aftercare following surgery on the digestive system: Secondary | ICD-10-CM | POA: Diagnosis not present

## 2019-09-05 DIAGNOSIS — C7A00 Malignant carcinoid tumor of unspecified site: Secondary | ICD-10-CM | POA: Diagnosis not present

## 2019-09-05 DIAGNOSIS — R1310 Dysphagia, unspecified: Secondary | ICD-10-CM | POA: Diagnosis not present

## 2019-09-05 DIAGNOSIS — G9341 Metabolic encephalopathy: Secondary | ICD-10-CM | POA: Diagnosis not present

## 2019-09-05 DIAGNOSIS — E43 Unspecified severe protein-calorie malnutrition: Secondary | ICD-10-CM | POA: Diagnosis not present

## 2019-09-10 DIAGNOSIS — E119 Type 2 diabetes mellitus without complications: Secondary | ICD-10-CM | POA: Diagnosis not present

## 2019-09-10 DIAGNOSIS — R1310 Dysphagia, unspecified: Secondary | ICD-10-CM | POA: Diagnosis not present

## 2019-09-10 DIAGNOSIS — E43 Unspecified severe protein-calorie malnutrition: Secondary | ICD-10-CM | POA: Diagnosis not present

## 2019-09-10 DIAGNOSIS — I10 Essential (primary) hypertension: Secondary | ICD-10-CM | POA: Diagnosis not present

## 2019-09-10 DIAGNOSIS — L89152 Pressure ulcer of sacral region, stage 2: Secondary | ICD-10-CM | POA: Diagnosis not present

## 2019-09-11 DIAGNOSIS — E43 Unspecified severe protein-calorie malnutrition: Secondary | ICD-10-CM | POA: Diagnosis not present

## 2019-09-11 DIAGNOSIS — R1310 Dysphagia, unspecified: Secondary | ICD-10-CM | POA: Diagnosis not present

## 2019-09-11 DIAGNOSIS — L89152 Pressure ulcer of sacral region, stage 2: Secondary | ICD-10-CM | POA: Diagnosis not present

## 2019-09-11 DIAGNOSIS — I10 Essential (primary) hypertension: Secondary | ICD-10-CM | POA: Diagnosis not present

## 2019-09-11 DIAGNOSIS — E119 Type 2 diabetes mellitus without complications: Secondary | ICD-10-CM | POA: Diagnosis not present

## 2019-09-12 ENCOUNTER — Telehealth: Payer: Self-pay | Admitting: *Deleted

## 2019-09-12 ENCOUNTER — Ambulatory Visit: Payer: Medicare Other | Admitting: Internal Medicine

## 2019-09-12 NOTE — Telephone Encounter (Signed)
Daughter, Kenney Houseman called and stated that they missed appointment that was scheduled for today at 11:30.   Daughter stated that they were going to discuss with you about discontinuing medications that patient does not need.   Stated that they could not get another appointment with you until 10/03/19. Daughter is wanting to know if you could review the medication and see what she can discontinue. Please Advise.

## 2019-09-12 NOTE — Telephone Encounter (Signed)
I feel like I need some context about stopping the medications.  Is Gloria Murphy still not eating well, remaining weak and struggling to take them?

## 2019-09-12 NOTE — Telephone Encounter (Signed)
If she is having loose stools or too frequent BMs now with the miralax, it can be held for a day or two.  Depending on how her bowels are doing, it can be adjusted to be every other day. If she is eating well and getting stronger, she may not need to continue the protein supplement. To decrease her pill burden, we can stop calcium and magnesium.

## 2019-09-12 NOTE — Telephone Encounter (Signed)
Called and spoke with [place] and she stated that her mom is eating well and doing good. Stated that she is only complaining about constipation and taking the Miralax.  Stated that they just wanted to know if you recommend her stopping any of her medications at this point.

## 2019-09-13 ENCOUNTER — Telehealth: Payer: Self-pay | Admitting: *Deleted

## 2019-09-13 ENCOUNTER — Other Ambulatory Visit: Payer: Self-pay | Admitting: Internal Medicine

## 2019-09-13 MED ORDER — CARVEDILOL 6.25 MG PO TABS: 6.2500 mg | ORAL_TABLET | Freq: Two times a day (BID) | ORAL | 0 refills | Status: DC

## 2019-09-13 NOTE — Telephone Encounter (Signed)
Patient daughter, Kenney Houseman called and stated that patient is out of her Pantoprazole. Daughter is wondering is Pepcid [facility] is ok for her to take instead. Please Advise.

## 2019-09-13 NOTE — Telephone Encounter (Signed)
Patient daughter notified and agreed.  Medication list updated.  Daughter requested refill on patient's Carvedilol. Faxed to pharmacy.

## 2019-09-13 NOTE — Telephone Encounter (Signed)
LMOM to return call.

## 2019-09-13 NOTE — Telephone Encounter (Signed)
They can try using famotidine instead of pantoprazole.  If she begins having difficulty with indigestion, we might need to go back on pantoprazole.  Please add the famotidine 20mg  before supper to her list.

## 2019-09-16 NOTE — Telephone Encounter (Signed)
Patient daughter notified and agreed.  Medication list updated.  

## 2019-09-19 DIAGNOSIS — L89152 Pressure ulcer of sacral region, stage 2: Secondary | ICD-10-CM | POA: Diagnosis not present

## 2019-09-19 DIAGNOSIS — R1310 Dysphagia, unspecified: Secondary | ICD-10-CM | POA: Diagnosis not present

## 2019-09-19 DIAGNOSIS — I10 Essential (primary) hypertension: Secondary | ICD-10-CM | POA: Diagnosis not present

## 2019-09-19 DIAGNOSIS — E43 Unspecified severe protein-calorie malnutrition: Secondary | ICD-10-CM | POA: Diagnosis not present

## 2019-09-19 DIAGNOSIS — E119 Type 2 diabetes mellitus without complications: Secondary | ICD-10-CM | POA: Diagnosis not present

## 2019-09-20 ENCOUNTER — Ambulatory Visit (INDEPENDENT_AMBULATORY_CARE_PROVIDER_SITE_OTHER): Payer: Medicare Other | Admitting: Family

## 2019-09-20 ENCOUNTER — Other Ambulatory Visit: Payer: Self-pay

## 2019-09-20 ENCOUNTER — Encounter: Payer: Self-pay | Admitting: Family

## 2019-09-20 VITALS — BP 124/84 | HR 86 | Temp 97.8°F | Resp 16 | Ht 69.0 in | Wt 159.2 lb

## 2019-09-20 DIAGNOSIS — R600 Localized edema: Secondary | ICD-10-CM | POA: Diagnosis not present

## 2019-09-20 DIAGNOSIS — R0600 Dyspnea, unspecified: Secondary | ICD-10-CM

## 2019-09-20 DIAGNOSIS — R0609 Other forms of dyspnea: Secondary | ICD-10-CM

## 2019-09-20 MED ORDER — FUROSEMIDE 20 MG PO TABS: ORAL_TABLET | ORAL | 3 refills | Status: DC

## 2019-09-20 MED ORDER — POTASSIUM CHLORIDE CRYS ER 20 MEQ PO TBCR: 20.0000 meq | EXTENDED_RELEASE_TABLET | Freq: Every day | ORAL | 3 refills | Status: DC

## 2019-09-20 NOTE — Patient Instructions (Addendum)
- Keep legs elevation above heart level when seated as tolerated - check weight on Monday,wednesday and Friday.Notify provider for abrupt weight gain > 3 lbs in one day. - check blood pressure daily prior to taking Furosemide hold if blood pressure < 90/60  - Avoid adding extra salt to food  - Wear knee high compression stockings on in the morning and take them off at bedtime - Keep follow up appointment with Dr.Reed on 10/03/2019  - Notify provider or go to ED if symptoms worsen    Furosemide Oral Tablets What is this medicine? FUROSEMIDE (fyoor [place] se mide) is a diuretic. It helps you make more urine and to lose salt and excess water from your body. It treats swelling from heart, kidney, or liver disease. It also treats high blood pressure. This medicine may be used for other purposes; ask your health care provider or pharmacist if you have questions. COMMON BRAND NAME(S): Active-Medicated Specimen Kit, Delone, Diuscreen, Lasix, RX Specimen Collection Kit, Specimen Collection Kit, URINX Medicated Specimen Collection What should I tell my health care provider before I take this medicine? They need to know if you have any of these conditions:  abnormal blood electrolytes  diarrhea or vomiting  gout  heart disease  kidney disease, small amounts of urine, or difficulty passing urine  liver disease  thyroid disease  an unusual or allergic reaction to furosemide, sulfa drugs, other medicines, foods, dyes, or preservatives  pregnant or trying to get pregnant  breast-feeding How should I use this medicine? Take this drug by mouth. Take it as directed on the prescription label at the same time every day. You can take it with or without food. If it upsets your stomach, take it with food. Keep taking it unless your health care provider tells you to stop. Talk to your health care provider about the use of this drug in children. Special care may be needed. Overdosage: If you think you have  taken too much of this medicine contact a poison control center or emergency room at once. NOTE: This medicine is only for you. Do not share this medicine with others. What if I miss a dose? If you miss a dose, take it as soon as you can. If it is almost time for your next dose, take only that dose. Do not take double or extra doses. What may interact with this medicine?  aspirin and aspirin-like medicines  certain antibiotics  chloral hydrate  cisplatin  cyclosporine  digoxin  diuretics  laxatives  lithium  medicines for blood pressure  medicines that relax muscles for surgery  methotrexate  NSAIDs, medicines for pain and inflammation like ibuprofen, naproxen, or indomethacin  phenytoin  steroid medicines like prednisone or cortisone  sucralfate  thyroid hormones This list may not describe all possible interactions. Give your health care provider a list of all the medicines, herbs, non-prescription drugs, or dietary supplements you use. Also tell them if you smoke, drink alcohol, or use illegal drugs. Some items may interact with your medicine. What should I watch for while using this medicine? Visit your doctor or health care provider for regular checks on your progress. Check your blood pressure regularly. Ask your doctor or health care provider what your blood pressure should be, and when you should contact him or her. If you are a diabetic, check your blood sugar as directed. This medicine may cause serious skin reactions. They can happen weeks to months after starting the medicine. Contact your health care provider  right away if you notice fevers or flu-like symptoms with a rash. The rash may be red or purple and then turn into blisters or peeling of the skin. Or, you might notice a red rash with swelling of the face, lips or lymph nodes in your neck or under your arms. You may need to be on a special diet while taking this medicine. Check with your doctor. Also, ask  how many glasses of fluid you need to drink a day. You must not get dehydrated. You may get drowsy or dizzy. Do not drive, use machinery, or do anything that needs mental alertness until you know how this drug affects you. Do not stand or sit up quickly, especially if you are an older patient. This reduces the risk of dizzy or fainting spells. Alcohol can make you more drowsy and dizzy. Avoid alcoholic drinks. This medicine can make you more sensitive to the sun. Keep out of the sun. If you cannot avoid being in the sun, wear protective clothing and use sunscreen. Do not use sun lamps or tanning beds/booths. What side effects may I notice from receiving this medicine? Side effects that you should report to your doctor or health care professional as soon as possible:  blood in urine or stools  dry mouth  fever or chills  hearing loss or ringing in the ears  irregular heartbeat  muscle pain or weakness, cramps  rash, fever, and swollen lymph nodes  redness, blistering, peeling or loosening of the skin, including inside the mouth  skin rash  stomach upset, pain, or nausea  tingling or numbness in the hands or feet  unusually weak or tired  vomiting or diarrhea  yellowing of the eyes or skin Side effects that usually do not require medical attention (report to your doctor or health care professional if they continue or are bothersome):  headache  loss of appetite  unusual bleeding or bruising This list may not describe all possible side effects. Call your doctor for medical advice about side effects. You may report side effects to FDA at 1-800-FDA-1088. Where should I keep my medicine? Keep out of the reach of children and pets. Store at room temperature between 20 and 25 degrees C (68 and 77 degrees F). Protect from light and moisture. Keep the container tightly closed. Throw away any unused drug after the expiration date. NOTE: This sheet is a summary. It may not cover all  possible information. If you have questions about this medicine, talk to your doctor, pharmacist, or health care provider.  2020 Elsevier/Gold Standard (2018-12-11 18:01:32)

## 2019-09-20 NOTE — Progress Notes (Signed)
Provider: Dinah Ngetich FNP-C  Gayland Curry, DO  Patient Care Team: Gayland Curry, DO as PCP - General (Geriatric Medicine) Marcial Pacas, MD as Consulting Physician (Neurology) Nigel Mormon, MD as Consulting Physician (Cardiology) Everitt Amber, MD as Consulting Physician (Gynecologic Oncology) Juanita Craver, MD as Consulting Physician (Gastroenterology)  Extended Emergency Contact Information Primary Emergency Contact: Bentlie, Dorazio Mobile Phone: [phone] Relation: Spouse Secondary Emergency Contact: Pratt,Sandra Address: [address]          [place], [place] [address] Johnnette Litter of [place] Phone: [phone] Mobile Phone: [phone] Relation: Daughter  Code Status: Full Code  Goals of care: Advanced Directive information Advanced Directives 09/20/2019  Does Patient Have a Medical Advance Directive? Yes  Type of Paramedic of [place];Living will  Does patient want to make changes to medical advance directive? No - Patient declined  Copy of [place] in Chart? Yes - validated most recent copy scanned in chart (See row information)  Would patient like information on creating a medical advance directive? -     Chief Complaint  Patient presents with  . Acute Visit    Swelling in Both Legs/ Shortness of Breathe.    HPI:  Pt is a 84 y.o. female seen today for an acute visit for evaluation of swelling on the legs and shortness of breath.she is here with the daughter and spouse on telephone.she states has had swelling and shortness of breath for one week but seems to be worsening.she has felt more tired than usual.No contact with sick person with COVID-19.She has compression hose at home but does not wear on a regular basis.she denies any cough or wheezing.she has had 4 lbs weight gain over three weeks.   Weight 155 lbs ( 08/30/2019) Weight 159 lbs ( 09/20/2019 ).   Past Medical History:  Diagnosis Date  . Diabetes  mellitus without complication ([place])   . High cholesterol   . Hypertension   . Thyroid disease    Past Surgical History:  Procedure Laterality Date  . ABDOMINAL HYSTERECTOMY    . EYE SURGERY Bilateral 06/2016   Cataract  . KNEE SURGERY Right   . LAPAROTOMY N/A 08/31/2017   Procedure: EXPLORATORY LAPAROTOMY; BILATERAL SALPINGOOPHERECTOMY; OMENTECTOMY, TUMOR DEBULKING;  Surgeon: Everitt Amber, MD;  Location: [facility];  Service: Gynecology;  Laterality: N/A;  . LAPAROTOMY N/A 07/01/2019   Procedure: EXPLORATORY LAPAROTOMY;  Surgeon: Jovita Kussmaul, MD;  Location: [facility];  Service: General;  Laterality: N/A;  . MASS EXCISION N/A 08/31/2017   Procedure: RESECTION OF PELVIC MASS;  Surgeon: Everitt Amber, MD;  Location: [facility];  Service: Gynecology;  Laterality: N/A;  . RIGHT HEART CATH N/A 02/11/2019   Procedure: RIGHT HEART CATH;  Surgeon: Nigel Mormon, MD;  Location: [facility];  Service: Cardiovascular;  Laterality: N/A;  . SMALL INTESTINE SURGERY      No Known Allergies  Outpatient Encounter Medications as of 09/20/2019  Medication Sig  . amLODipine (NORVASC) 5 MG tablet Take 5 mg by mouth daily.  [name] aspirin EC 81 MG tablet Take 1 tablet (81 mg total) by mouth daily.  . carboxymethylcellulose (REFRESH TEARS) 0.5 % SOLN Place 1 drop into both eyes 3 (three) times daily.  . carvedilol (COREG) 6.25 MG tablet TAKE 1 TABLET(6.25 MG) BY MOUTH TWICE DAILY WITH A MEAL  . chlorhexidine (PERIDEX) 0.12 % solution 15 mLs by Mouth Rinse route 2 (two) times daily.  . famotidine (PEPCID) 20 MG tablet Take  20 mg by mouth as needed.   [name] MAGNESIUM OXIDE PO Take by mouth daily.  . polyethylene glycol (MIRALAX / GLYCOLAX) 17 g packet Take 17 g by mouth daily. Hold for diarrhoea  . [DISCONTINUED] amLODipine (NORVASC) 10 MG tablet Take 1 tablet (10 mg total) by mouth daily.   No facility-administered encounter medications on file as of 09/20/2019.    Review of Systems  Constitutional: Positive for  fatigue. Negative for appetite change, chills and fever.  HENT: Negative for congestion, rhinorrhea, sinus pressure, sinus pain, sneezing and sore throat.   Respiratory: Positive for shortness of breath. Negative for cough, chest tightness and wheezing.   Cardiovascular: Positive for leg swelling. Negative for chest pain and palpitations.  Gastrointestinal: Negative for abdominal distention, abdominal pain, constipation, diarrhea, nausea and vomiting.  Endocrine: Negative for cold intolerance, heat intolerance, polydipsia, polyphagia and polyuria.  Genitourinary: Negative for difficulty urinating, dysuria, flank pain, frequency, hematuria and urgency.  Musculoskeletal: Positive for gait problem. Negative for joint swelling.  Skin: Negative for color change, pallor and rash.  Neurological: Negative for dizziness, syncope, speech difficulty, weakness, light-headedness, numbness and headaches.  Hematological: Does not bruise/bleed easily.  Psychiatric/Behavioral: Negative for agitation, confusion and sleep disturbance. The patient is not nervous/anxious.     Immunization History  Administered Date(s) Administered  . Influenza, High Dose Seasonal PF 03/07/2018, 02/02/2019  . PFIZER SARS-COV-2 Vaccination 06/17/2019, 08/13/2019   Pertinent  Health Maintenance Due  Topic Date Due  . OPHTHALMOLOGY EXAM  Never done  . URINE MICROALBUMIN  Never done  . DEXA SCAN  Never done  . PNA vac Low Risk Adult (1 of 2 - PCV13) Never done  . INFLUENZA VACCINE  12/08/2019  . HEMOGLOBIN A1C  12/25/2019  . FOOT EXAM  08/29/2020   Fall Risk  09/20/2019 08/30/2019 12/22/2016  Falls in the past year? 1 0 No  Number falls in past yr: 0 0 -  Injury with Fall? 0 0 -   Functional Status Survey:    Vitals:   09/20/19 1505  BP: 124/84  Pulse: 86  Resp: 16  Temp: 97.8 F (36.6 C)  SpO2: 94%  Weight: 159 lb 3.2 oz (72.2 kg)  Height: 5\' 9"  (1.753 m)   Body mass index is 23.51 kg/m. Physical Exam Vitals  reviewed.  Constitutional:      General: She is not in acute distress.    Appearance: She is normal weight. She is not ill-appearing.  HENT:     Ears:     Comments: Hearing aids in place     Mouth/Throat:     Mouth: Mucous membranes are moist.     Pharynx: Oropharynx is clear. No oropharyngeal exudate or posterior oropharyngeal erythema.  Eyes:     General: No scleral icterus.       Right eye: No discharge.        Left eye: No discharge.     Conjunctiva/sclera: Conjunctivae normal.     Pupils: Pupils are equal, round, and reactive to light.     Comments: Corrective lens in place   Neck:     Vascular: No carotid bruit.  Cardiovascular:     Rate and Rhythm: Normal rate and regular rhythm.     Pulses: Normal pulses.     Heart sounds: Normal heart sounds. No murmur. No friction rub. No gallop.   Pulmonary:     Effort: Pulmonary effort is normal. No respiratory distress.     Breath sounds: Normal breath sounds. No wheezing,  rhonchi or rales.  Chest:     Chest wall: No tenderness.  Abdominal:     General: Bowel sounds are normal. There is no distension.     Palpations: Abdomen is soft. There is no mass.     Tenderness: There is no abdominal tenderness. There is no right CVA tenderness, left CVA tenderness, guarding or rebound.  Musculoskeletal:        General: No tenderness.     Cervical back: Normal range of motion. No rigidity or tenderness.     Right lower leg: Edema present.     Left lower leg: Edema present.     Comments: Unsteady gait ambulates with walker.Bilateral lower extremities 3+ edema.  Lymphadenopathy:     Cervical: No cervical adenopathy.  Skin:    General: Skin is warm and dry.     Coloration: Skin is not pale.     Findings: No bruising, erythema or rash.  Neurological:     Mental Status: She is alert. Mental status is at baseline.     Cranial Nerves: No cranial nerve deficit.     Sensory: No sensory deficit.     Motor: No weakness.     Gait: Gait abnormal.   Psychiatric:        Mood and Affect: Mood normal.        Behavior: Behavior normal.        Thought Content: Thought content normal.        Judgment: Judgment normal.    Labs reviewed: Recent Labs    07/20/19 0443 07/21/19 2333 07/22/19 0828  NA 134* 135 135  K 3.7 3.5 3.6  CL 103 105 106  CO2 22 22 21*  GLUCOSE 151* 103* 115*  BUN 25* 23 21  CREATININE 0.69 0.58 0.62  CALCIUM 8.7* 8.5* 8.6*  MG 2.0 1.9 1.6*  PHOS 3.1 2.8 2.8   Recent Labs    07/19/19 0500 07/21/19 2333 07/22/19 0828  AST 77* 55* 51*  ALT 84* 63* 63*  ALKPHOS 382* 320* 322*  BILITOT 0.7 0.5 0.5  PROT 7.1 7.0 7.1  ALBUMIN 2.0* 1.9* 2.0*   Recent Labs    07/08/19 0500 07/09/19 0500 07/15/19 0430 07/16/19 0415 07/18/19 0449 07/19/19 0500 07/22/19 0330  WBC 6.6   < > 5.4   < > 4.2 5.0 4.4  NEUTROABS 5.3  --  4.1  --   --   --  2.6  HGB 8.5*   < > 8.7*   < > 7.9* 8.1* 7.7*  HCT 26.4*   < > 26.8*   < > 24.6* 25.7* 24.6*  MCV 80.0   < > 80.0   < > 79.1* 80.1 79.6*  PLT 242   < > 342   < > 344 363 326   < > = values in this interval not displayed.   Lab Results  Component Value Date   TSH 0.031 (L) 07/18/2019   Lab Results  Component Value Date   HGBA1C 6.3 (H) 06/27/2019   Lab Results  Component Value Date   CHOL  10/31/2007    112        ATP III CLASSIFICATION:  <200     mg/dL   Desirable  200-239  mg/dL   Borderline High  >=240    mg/dL   High   HDL 42 10/31/2007   LDLCALC  10/31/2007    60        Total Cholesterol/HDL:CHD Risk Coronary Heart Disease Risk Table  Men   Women  1/2 Average Risk   3.4   3.3   TRIG 75 07/22/2019   CHOLHDL 2.7 10/31/2007    Significant Diagnostic Results in last 30 days:  No results found.  Assessment/Plan 1. Bilateral lower extremity edema Bilateral lower extremities 3+ pitting edema,shortness of breath at rest and exertion.Has had 4 lbs weight gain over three weeks.Discussed starting on Furosemide as below along with  Potassium chloride.Side effects discussed verbalized understanding.Additional Furosemide education information provided on AVS. - furosemide (LASIX) 20 MG tablet; Take 2 tablet ( 40 mg) by mouth daily x 3 days then take one by mouth daily.Hold if B/p Less than 90/60  Dispense: 30 tablet; Refill: 3 - potassium chloride SA (KLOR-CON) 20 MEQ tablet; Take 1 tablet (20 mEq total) by mouth daily.  Dispense: 30 tablet; Refill: 3 - Advised to Keep legs elevation above heart level when seated as tolerated - Advised to check weight on Monday,wednesday and Friday.Notify provider for abrupt weight gain > 3 lbs in one day. - Also advised to check blood pressure daily prior to taking Furosemide hold if blood pressure < 90/60  - Avoid adding extra salt to food  - Has compression hose but not wearing.Advised to wear knee high compression stockings on in the morning and take them off at bedtime - Keep follow up appointment with Dr.Reed on 10/03/2019  - Notify provider or go to ED if symptoms worsen  - BMP on next visit.   2. Dyspnea on exertion Suspect due to worsening edema.Agree to start on Furosemide as above.  - furosemide (LASIX) 20 MG tablet; Take 2 tablet ( 40 mg) by mouth daily x 3 days then take one by mouth daily.Hold if B/p Less than 90/60  Dispense: 30 tablet; Refill: 3 - potassium chloride SA (KLOR-CON) 20 MEQ tablet; Take 1 tablet (20 mEq total) by mouth daily.  Dispense: 30 tablet; Refill: 3  Family/ staff Communication: Reviewed plan of care with patient,spouse on the phone and daughter.   Labs/tests ordered: None   Next Appointment:   Sandrea Hughs, NP

## 2019-09-26 ENCOUNTER — Other Ambulatory Visit: Payer: Self-pay

## 2019-09-26 ENCOUNTER — Encounter: Payer: Self-pay | Admitting: Family

## 2019-09-26 ENCOUNTER — Telehealth (INDEPENDENT_AMBULATORY_CARE_PROVIDER_SITE_OTHER): Payer: Medicare Other | Admitting: Family

## 2019-09-26 ENCOUNTER — Telehealth: Payer: Self-pay | Admitting: *Deleted

## 2019-09-26 DIAGNOSIS — R630 Anorexia: Secondary | ICD-10-CM | POA: Diagnosis not present

## 2019-09-26 DIAGNOSIS — R600 Localized edema: Secondary | ICD-10-CM

## 2019-09-26 DIAGNOSIS — R0609 Other forms of dyspnea: Secondary | ICD-10-CM

## 2019-09-26 DIAGNOSIS — R0600 Dyspnea, unspecified: Secondary | ICD-10-CM

## 2019-09-26 MED ORDER — FUROSEMIDE 20 MG PO TABS: ORAL_TABLET | ORAL | 3 refills | Status: DC

## 2019-09-26 NOTE — Progress Notes (Addendum)
This service is provided via telemedicine  No vital signs collected/recorded due to the encounter was a telemedicine visit.   Location of patient (ex: home, work): Home.  Patient consents to a telephone visit: Yes.  Location of the provider (ex: office, home):  [facility]   Name of any referring provider: N/A  Names of all persons participating in the telemedicine service and their role in the encounter: Patient, [place] Crane Daughter, Heriberto Antigua, [place], Tag Wurtz, [place], NP.    Time spent on call: 8 minutes spent on the phone with Medical Assistant.     Provider: Marylynne Keelin FNP-C  Gayland Curry, DO  Patient Care Team: Gayland Curry, DO as PCP - General (Geriatric Medicine) Marcial Pacas, MD as Consulting Physician (Neurology) Nigel Mormon, MD as Consulting Physician (Cardiology) Everitt Amber, MD as Consulting Physician (Gynecologic Oncology) Juanita Craver, MD as Consulting Physician (Gastroenterology)  Extended Emergency Contact Information Primary Emergency Contact: Rika, Beam Mobile Phone: [phone] Relation: Spouse Secondary Emergency Contact: Pratt,Sandra Address: [address]          [place], [place] [address] Johnnette Litter of [place] Phone: [phone] Mobile Phone: [phone] Relation: Daughter  Code Status:  Full Code  Goals of care: Advanced Directive information Advanced Directives 09/26/2019  Does Patient Have a Medical Advance Directive? Yes  Type of Paramedic of [place];Living will  Does patient want to make changes to medical advance directive? No - Patient declined  Copy of [place] in Chart? Yes - validated most recent copy scanned in chart (See row information)  Would patient like information on creating a medical advance directive? -     Chief Complaint  Patient presents with  . Acute Visit    Complains of no appeitite.    HPI:  Pt is a 84 y.o. female seen  today for an acute visit for evaluation of loss of appetite.Patient's husband and daughter [place] Crane present during visit provides additional information.she states patient started on Furosemide 40 mg tablet daily on Sunday 09/22/2019 and 20 mg tablet today.Swelling on both legs have improved.Her weight is down to 149 lbs.recent weight at the office was 159 lbs.still has some shortness of breath with exertion but has improved.Daughter states since starting on furosemide patient has had loss of appetite.which is one of the side effects for Furosemide.she wonders whether to continue monitoring since patient is down to Furosemide 20 mg tablet.Has knee high ted hose but didn't wear them today.she denies any acute illness or urinary tract infection symptoms.    Past Medical History:  Diagnosis Date  . Diabetes mellitus without complication ([place])   . High cholesterol   . Hypertension   . Thyroid disease    Past Surgical History:  Procedure Laterality Date  . ABDOMINAL HYSTERECTOMY    . EYE SURGERY Bilateral 06/2016   Cataract  . KNEE SURGERY Right   . LAPAROTOMY N/A 08/31/2017   Procedure: EXPLORATORY LAPAROTOMY; BILATERAL SALPINGOOPHERECTOMY; OMENTECTOMY, TUMOR DEBULKING;  Surgeon: Everitt Amber, MD;  Location: [facility];  Service: Gynecology;  Laterality: N/A;  . LAPAROTOMY N/A 07/01/2019   Procedure: EXPLORATORY LAPAROTOMY;  Surgeon: Jovita Kussmaul, MD;  Location: [facility];  Service: General;  Laterality: N/A;  . MASS EXCISION N/A 08/31/2017   Procedure: RESECTION OF PELVIC MASS;  Surgeon: Everitt Amber, MD;  Location: [facility];  Service: Gynecology;  Laterality: N/A;  . RIGHT HEART CATH N/A 02/11/2019   Procedure: RIGHT HEART CATH;  Surgeon: Virgina Jock,  Reynold Bowen, MD;  Location: [facility];  Service: Cardiovascular;  Laterality: N/A;  . SMALL INTESTINE SURGERY      No Known Allergies  Outpatient Encounter Medications as of 09/26/2019  Medication Sig  . amLODipine (NORVASC) 5 MG tablet Take 5  mg by mouth daily.  [name] aspirin EC 81 MG tablet Take 1 tablet (81 mg total) by mouth daily.  . Calcium Carb-Cholecalciferol (CALTRATE 600+D3 PO) Take 1 tablet by mouth daily.  . carboxymethylcellulose (REFRESH TEARS) 0.5 % SOLN Place 1 drop into both eyes 3 (three) times daily.  . carvedilol (COREG) 6.25 MG tablet TAKE 1 TABLET(6.25 MG) BY MOUTH TWICE DAILY WITH A MEAL  . chlorhexidine (PERIDEX) 0.12 % solution 15 mLs by Mouth Rinse route 2 (two) times daily.  . famotidine (PEPCID) 20 MG tablet Take 20 mg by mouth as needed.   . furosemide (LASIX) 20 MG tablet Take 2 tablet ( 40 mg) by mouth daily x 3 days then take one by mouth daily.Hold if B/p Less than 90/60  . MAGNESIUM OXIDE PO Take by mouth daily.  . polyethylene glycol (MIRALAX / GLYCOLAX) 17 g packet Take 17 g by mouth daily. Hold for diarrhoea  . potassium chloride SA (KLOR-CON) 20 MEQ tablet Take 1 tablet (20 mEq total) by mouth daily.   No facility-administered encounter medications on file as of 09/26/2019.    Review of Systems  Constitutional: Positive for appetite change. Negative for chills, fatigue, fever and unexpected weight change.  Respiratory: Negative for chest tightness and wheezing.        Shortness of breath with exertion improved   Cardiovascular: Negative for chest pain and palpitations.       Leg swelling has improved   Gastrointestinal: Negative for abdominal distention, abdominal pain, constipation, diarrhea, nausea and vomiting.  Genitourinary: Negative for decreased urine volume, difficulty urinating, dysuria, flank pain, frequency, hematuria and urgency.  Skin: Negative for color change, pallor and rash.  Neurological: Negative for dizziness, weakness, light-headedness and headaches.    Immunization History  Administered Date(s) Administered  . Influenza, High Dose Seasonal PF 03/07/2018, 02/02/2019  . PFIZER SARS-COV-2 Vaccination 06/17/2019, 08/13/2019   Pertinent  Health Maintenance Due  Topic Date  Due  . OPHTHALMOLOGY EXAM  Never done  . URINE MICROALBUMIN  Never done  . DEXA SCAN  Never done  . PNA vac Low Risk Adult (1 of 2 - PCV13) Never done  . INFLUENZA VACCINE  12/08/2019  . HEMOGLOBIN A1C  12/25/2019  . FOOT EXAM  08/29/2020   Fall Risk  09/26/2019 09/20/2019 08/30/2019 12/22/2016  Falls in the past year? 0 1 0 No  Number falls in past yr: 0 0 0 -  Injury with Fall? 0 0 0 -   There were no vitals filed for this visit. There is no height or weight on file to calculate BMI. Physical Exam  Unable to complete on telephone visit.   Labs reviewed: Recent Labs    07/20/19 0443 07/21/19 2333 07/22/19 0828  NA 134* 135 135  K 3.7 3.5 3.6  CL 103 105 106  CO2 22 22 21*  GLUCOSE 151* 103* 115*  BUN 25* 23 21  CREATININE 0.69 0.58 0.62  CALCIUM 8.7* 8.5* 8.6*  MG 2.0 1.9 1.6*  PHOS 3.1 2.8 2.8   Recent Labs    07/19/19 0500 07/21/19 2333 07/22/19 0828  AST 77* 55* 51*  ALT 84* 63* 63*  ALKPHOS 382* 320* 322*  BILITOT 0.7 0.5 0.5  PROT 7.1 7.0 7.1  ALBUMIN 2.0* 1.9* 2.0*   Recent Labs    07/08/19 0500 07/09/19 0500 07/15/19 0430 07/16/19 0415 07/18/19 0449 07/19/19 0500 07/22/19 0330  WBC 6.6   < > 5.4   < > 4.2 5.0 4.4  NEUTROABS 5.3  --  4.1  --   --   --  2.6  HGB 8.5*   < > 8.7*   < > 7.9* 8.1* 7.7*  HCT 26.4*   < > 26.8*   < > 24.6* 25.7* 24.6*  MCV 80.0   < > 80.0   < > 79.1* 80.1 79.6*  PLT 242   < > 342   < > 344 363 326   < > = values in this interval not displayed.   Lab Results  Component Value Date   TSH 0.031 (L) 07/18/2019   Lab Results  Component Value Date   HGBA1C 6.3 (H) 06/27/2019   Lab Results  Component Value Date   CHOL  10/31/2007    112        ATP III CLASSIFICATION:  <200     mg/dL   Desirable  200-239  mg/dL   Borderline High  >=240    mg/dL   High   HDL 42 10/31/2007   LDLCALC  10/31/2007    60        Total Cholesterol/HDL:CHD Risk Coronary Heart Disease Risk Table                     Men   Women  1/2  Average Risk   3.4   3.3   TRIG 75 07/22/2019   CHOLHDL 2.7 10/31/2007    Significant Diagnostic Results in last 30 days:  No results found.  Assessment/Plan 1. Bilateral lower extremity edema Has improved. - Advised to reduce Furosemide 20 mg tablet to 10 mg Tablet one by mouth daily.May cut current medication in half. - continue to monitor weight  - continue knee high compression stockings on in the morning and off at bedtime - continue to keep legs elevated above heart level when seated.  - Notify provider if symptoms worsen or fail to improve   2. Loss of appetite New onset.Suspect due to Furosemide side effects.Adjust furosemide as above.will continue to monitor for now.consider appetite stimulant if symptoms worsen.   3.Dyspnea  Has improved.Adjust Furosemide as above.Advised to continue to monitor weight.  Family/ staff Communication: Reviewed plan of care with patient,Husband and daughter verbalized understanding.   Labs/tests ordered: None   Next Appointment: As needed if symptoms worsen or fail to improve.   Spent 11 minutes of non-face to face with patient   I connected with  [name] on 09/26/19 by a Telephone visit enabled telemedicine application and verified that I am speaking with the correct person using two identifiers.   I discussed the limitations of evaluation and management by telemedicine. The patient expressed understanding and agreed to proceed.  Sandrea Hughs, NP

## 2019-09-26 NOTE — Telephone Encounter (Signed)
Kenney Houseman, daughter called and left message on Clinical Intake stating that patient was seen last Friday and Prescribed Lasix and Potassium and since starting the medication patient has had a loss of appetite. Stated that her swelling is better. No other symptoms.   I called and spoke with daughter Silva Bandy on [place] since Kenney Houseman is not listed and she stated that patient is doing better but has the loss of appetite.  Stated that she will update the DPR at next appointment.   TeleVisit Appointment scheduled with Dinah for today.

## 2019-09-26 NOTE — Patient Instructions (Signed)
-   Reduce Furosemide 20 mg tablet to 10 mg Tablet one by mouth daily.May cut current medication in half. - continue to monitor weight  - continue knee high compression stockings on in the morning and off at bedtime - continue to keep legs elevated above heart level when seated.  - Notify provider if symptoms worsen or fail to improve

## 2019-09-29 DIAGNOSIS — Z743 Need for continuous supervision: Secondary | ICD-10-CM | POA: Diagnosis not present

## 2019-09-29 DIAGNOSIS — R0689 Other abnormalities of breathing: Secondary | ICD-10-CM | POA: Diagnosis not present

## 2019-09-29 DIAGNOSIS — R000 Tachycardia, unspecified: Secondary | ICD-10-CM | POA: Diagnosis not present

## 2019-09-29 DIAGNOSIS — I491 Atrial premature depolarization: Secondary | ICD-10-CM | POA: Diagnosis not present

## 2019-09-29 DIAGNOSIS — R404 Transient alteration of awareness: Secondary | ICD-10-CM | POA: Diagnosis not present

## 2019-09-30 ENCOUNTER — Other Ambulatory Visit: Payer: Self-pay

## 2019-09-30 ENCOUNTER — Emergency Department (HOSPITAL_COMMUNITY): Payer: Medicare Other

## 2019-09-30 ENCOUNTER — Inpatient Hospital Stay (HOSPITAL_COMMUNITY)
Admission: EM | Admit: 2019-09-30 | Discharge: 2019-10-03 | DRG: 178 | Disposition: A | Discharge status: Home Health | Payer: Medicare Other | Attending: Internal Medicine | Admitting: Internal Medicine

## 2019-09-30 ENCOUNTER — Inpatient Hospital Stay (HOSPITAL_COMMUNITY): Payer: Medicare Other

## 2019-09-30 ENCOUNTER — Encounter (HOSPITAL_COMMUNITY): Payer: Self-pay | Admitting: Emergency Medicine

## 2019-09-30 DIAGNOSIS — Z833 Family history of diabetes mellitus: Secondary | ICD-10-CM | POA: Diagnosis not present

## 2019-09-30 DIAGNOSIS — Z7189 Other specified counseling: Secondary | ICD-10-CM | POA: Diagnosis not present

## 2019-09-30 DIAGNOSIS — G9341 Metabolic encephalopathy: Secondary | ICD-10-CM | POA: Diagnosis not present

## 2019-09-30 DIAGNOSIS — R7881 Bacteremia: Secondary | ICD-10-CM | POA: Diagnosis present

## 2019-09-30 DIAGNOSIS — J690 Pneumonitis due to inhalation of food and vomit: Secondary | ICD-10-CM | POA: Diagnosis not present

## 2019-09-30 DIAGNOSIS — R918 Other nonspecific abnormal finding of lung field: Secondary | ICD-10-CM | POA: Diagnosis not present

## 2019-09-30 DIAGNOSIS — C7A00 Malignant carcinoid tumor of unspecified site: Secondary | ICD-10-CM | POA: Diagnosis not present

## 2019-09-30 DIAGNOSIS — D638 Anemia in other chronic diseases classified elsewhere: Secondary | ICD-10-CM | POA: Diagnosis not present

## 2019-09-30 DIAGNOSIS — R0682 Tachypnea, not elsewhere classified: Secondary | ICD-10-CM

## 2019-09-30 DIAGNOSIS — D649 Anemia, unspecified: Secondary | ICD-10-CM | POA: Diagnosis not present

## 2019-09-30 DIAGNOSIS — Z8673 Personal history of transient ischemic attack (TIA), and cerebral infarction without residual deficits: Secondary | ICD-10-CM | POA: Diagnosis not present

## 2019-09-30 DIAGNOSIS — R6510 Systemic inflammatory response syndrome (SIRS) of non-infectious origin without acute organ dysfunction: Secondary | ICD-10-CM | POA: Diagnosis not present

## 2019-09-30 DIAGNOSIS — E119 Type 2 diabetes mellitus without complications: Secondary | ICD-10-CM

## 2019-09-30 DIAGNOSIS — Z20822 Contact with and (suspected) exposure to covid-19: Secondary | ICD-10-CM | POA: Diagnosis not present

## 2019-09-30 DIAGNOSIS — E872 Acidosis: Secondary | ICD-10-CM | POA: Diagnosis not present

## 2019-09-30 DIAGNOSIS — Z515 Encounter for palliative care: Secondary | ICD-10-CM | POA: Diagnosis not present

## 2019-09-30 DIAGNOSIS — E079 Disorder of thyroid, unspecified: Secondary | ICD-10-CM | POA: Diagnosis present

## 2019-09-30 DIAGNOSIS — Z79899 Other long term (current) drug therapy: Secondary | ICD-10-CM | POA: Diagnosis not present

## 2019-09-30 DIAGNOSIS — E440 Moderate protein-calorie malnutrition: Secondary | ICD-10-CM | POA: Insufficient documentation

## 2019-09-30 DIAGNOSIS — R000 Tachycardia, unspecified: Secondary | ICD-10-CM | POA: Diagnosis not present

## 2019-09-30 DIAGNOSIS — Z6821 Body mass index (BMI) 21.0-21.9, adult: Secondary | ICD-10-CM | POA: Diagnosis not present

## 2019-09-30 DIAGNOSIS — R059 Cough, unspecified: Secondary | ICD-10-CM

## 2019-09-30 DIAGNOSIS — R10813 Right lower quadrant abdominal tenderness: Secondary | ICD-10-CM

## 2019-09-30 DIAGNOSIS — R2681 Unsteadiness on feet: Secondary | ICD-10-CM | POA: Diagnosis not present

## 2019-09-30 DIAGNOSIS — Z8249 Family history of ischemic heart disease and other diseases of the circulatory system: Secondary | ICD-10-CM | POA: Diagnosis not present

## 2019-09-30 DIAGNOSIS — Z7982 Long term (current) use of aspirin: Secondary | ICD-10-CM | POA: Diagnosis not present

## 2019-09-30 DIAGNOSIS — E7800 Pure hypercholesterolemia, unspecified: Secondary | ICD-10-CM | POA: Diagnosis not present

## 2019-09-30 DIAGNOSIS — R0600 Dyspnea, unspecified: Secondary | ICD-10-CM | POA: Diagnosis not present

## 2019-09-30 DIAGNOSIS — K56609 Unspecified intestinal obstruction, unspecified as to partial versus complete obstruction: Secondary | ICD-10-CM | POA: Diagnosis not present

## 2019-09-30 DIAGNOSIS — R5081 Fever presenting with conditions classified elsewhere: Secondary | ICD-10-CM

## 2019-09-30 DIAGNOSIS — I10 Essential (primary) hypertension: Secondary | ICD-10-CM | POA: Diagnosis not present

## 2019-09-30 DIAGNOSIS — Z66 Do not resuscitate: Secondary | ICD-10-CM | POA: Diagnosis not present

## 2019-09-30 DIAGNOSIS — R509 Fever, unspecified: Secondary | ICD-10-CM | POA: Diagnosis not present

## 2019-09-30 DIAGNOSIS — J9691 Respiratory failure, unspecified with hypoxia: Secondary | ICD-10-CM | POA: Diagnosis not present

## 2019-09-30 DIAGNOSIS — R2689 Other abnormalities of gait and mobility: Secondary | ICD-10-CM | POA: Diagnosis not present

## 2019-09-30 DIAGNOSIS — Z48815 Encounter for surgical aftercare following surgery on the digestive system: Secondary | ICD-10-CM | POA: Diagnosis not present

## 2019-09-30 DIAGNOSIS — R1031 Right lower quadrant pain: Secondary | ICD-10-CM | POA: Diagnosis not present

## 2019-09-30 LAB — CBG MONITORING, ED
Glucose-Capillary: 139 mg/dL — ABNORMAL HIGH (ref 70–99)
Glucose-Capillary: 141 mg/dL — ABNORMAL HIGH (ref 70–99)
Glucose-Capillary: 98 mg/dL (ref 70–99)

## 2019-09-30 LAB — CBC WITH DIFFERENTIAL/PLATELET
Abs Immature Granulocytes: 0.13 10*3/uL — ABNORMAL HIGH (ref 0.00–0.07)
Abs Immature Granulocytes: 0.14 10*3/uL — ABNORMAL HIGH (ref 0.00–0.07)
Basophils Absolute: 0.1 10*3/uL (ref 0.0–0.1)
Basophils Absolute: 0.1 10*3/uL (ref 0.0–0.1)
Basophils Relative: 1 %
Basophils Relative: 1 %
Eosinophils Absolute: 0.1 10*3/uL (ref 0.0–0.5)
Eosinophils Absolute: 0.1 10*3/uL (ref 0.0–0.5)
Eosinophils Relative: 1 %
Eosinophils Relative: 1 %
HCT: 34.5 % — ABNORMAL LOW (ref 36.0–46.0)
HCT: 35.6 % — ABNORMAL LOW (ref 36.0–46.0)
Hemoglobin: 10.8 g/dL — ABNORMAL LOW (ref 12.0–15.0)
Hemoglobin: 10.9 g/dL — ABNORMAL LOW (ref 12.0–15.0)
Immature Granulocytes: 2 %
Immature Granulocytes: 3 %
Lymphocytes Relative: 10 %
Lymphocytes Relative: 14 %
Lymphs Abs: 0.6 10*3/uL — ABNORMAL LOW (ref 0.7–4.0)
Lymphs Abs: 0.8 10*3/uL (ref 0.7–4.0)
MCH: 25.5 pg — ABNORMAL LOW (ref 26.0–34.0)
MCH: 26 pg (ref 26.0–34.0)
MCHC: 30.3 g/dL (ref 30.0–36.0)
MCHC: 31.6 g/dL (ref 30.0–36.0)
MCV: 82.1 fL (ref 80.0–100.0)
MCV: 84.2 fL (ref 80.0–100.0)
Monocytes Absolute: 0.7 10*3/uL (ref 0.1–1.0)
Monocytes Absolute: 0.7 10*3/uL (ref 0.1–1.0)
Monocytes Relative: 10 %
Monocytes Relative: 12 %
Neutro Abs: 3.8 10*3/uL (ref 1.7–7.7)
Neutro Abs: 5 10*3/uL (ref 1.7–7.7)
Neutrophils Relative %: 69 %
Neutrophils Relative %: 76 %
Platelets: 226 10*3/uL (ref 150–400)
Platelets: 262 10*3/uL (ref 150–400)
RBC: 4.2 MIL/uL (ref 3.87–5.11)
RBC: 4.23 MIL/uL (ref 3.87–5.11)
RDW: 17.6 % — ABNORMAL HIGH (ref 11.5–15.5)
RDW: 17.8 % — ABNORMAL HIGH (ref 11.5–15.5)
WBC: 5.5 10*3/uL (ref 4.0–10.5)
WBC: 6.5 10*3/uL (ref 4.0–10.5)
nRBC: 0 % (ref 0.0–0.2)
nRBC: 0 % (ref 0.0–0.2)

## 2019-09-30 LAB — COMPREHENSIVE METABOLIC PANEL
ALT: 16 U/L (ref 0–44)
ALT: 13 U/L (ref 0–44)
AST: 26 U/L (ref 15–41)
AST: 23 U/L (ref 15–41)
Albumin: 2.5 g/dL — ABNORMAL LOW (ref 3.5–5.0)
Albumin: 2.3 g/dL — ABNORMAL LOW (ref 3.5–5.0)
Alkaline Phosphatase: 77 U/L (ref 38–126)
Alkaline Phosphatase: 66 U/L (ref 38–126)
Anion gap: 10 (ref 5–15)
Anion gap: 9 (ref 5–15)
BUN: 11 mg/dL (ref 8–23)
BUN: 8 mg/dL (ref 8–23)
CO2: 20 mmol/L — ABNORMAL LOW (ref 22–32)
CO2: 16 mmol/L — ABNORMAL LOW (ref 22–32)
Calcium: 8.8 mg/dL — ABNORMAL LOW (ref 8.9–10.3)
Calcium: 8.4 mg/dL — ABNORMAL LOW (ref 8.9–10.3)
Chloride: 106 mmol/L (ref 98–111)
Chloride: 113 mmol/L — ABNORMAL HIGH (ref 98–111)
Creatinine, Ser: 0.77 mg/dL (ref 0.44–1.00)
Creatinine, Ser: 0.65 mg/dL (ref 0.44–1.00)
GFR calc Af Amer: >60 mL/min (ref >60)
GFR calc Af Amer: >60 mL/min (ref >60)
GFR calc non Af Amer: >60 mL/min (ref >60)
GFR calc non Af Amer: >60 mL/min (ref >60)
Glucose, Bld: 143 mg/dL — ABNORMAL HIGH (ref 70–99)
Glucose, Bld: 138 mg/dL — ABNORMAL HIGH (ref 70–99)
Potassium: 4.1 mmol/L (ref 3.5–5.1)
Potassium: 3.5 mmol/L (ref 3.5–5.1)
Sodium: 136 mmol/L (ref 135–145)
Sodium: 138 mmol/L (ref 135–145)
Total Bilirubin: 0.5 mg/dL (ref 0.3–1.2)
Total Bilirubin: 0.6 mg/dL (ref 0.3–1.2)
Total Protein: 7.6 g/dL (ref 6.5–8.1)
Total Protein: 7.1 g/dL (ref 6.5–8.1)

## 2019-09-30 LAB — URINALYSIS, ROUTINE W REFLEX MICROSCOPIC
Bacteria, UA: NONE SEEN
Bilirubin Urine: NEGATIVE
Glucose, UA: NEGATIVE mg/dL
Hgb urine dipstick: NEGATIVE
Ketones, ur: NEGATIVE mg/dL
Leukocytes,Ua: NEGATIVE
Nitrite: NEGATIVE
Protein, ur: 30 mg/dL — AB
Specific Gravity, Urine: 1.015 (ref 1.005–1.030)
pH: 7 (ref 5.0–8.0)

## 2019-09-30 LAB — PROTIME-INR
INR: 1.1 (ref 0.8–1.2)
Prothrombin Time: 14.1 seconds (ref 11.4–15.2)

## 2019-09-30 LAB — LACTIC ACID, PLASMA
Lactic Acid, Venous: 1.7 mmol/L (ref 0.5–1.9)
Lactic Acid, Venous: 1.7 mmol/L (ref 0.5–1.9)

## 2019-09-30 LAB — GLUCOSE, CAPILLARY: Glucose-Capillary: 102 mg/dL — ABNORMAL HIGH (ref 70–99)

## 2019-09-30 LAB — POC SARS CORONAVIRUS 2 AG -  ED: SARS Coronavirus 2 Ag: NEGATIVE

## 2019-09-30 LAB — APTT: aPTT: 28 seconds (ref 24–36)

## 2019-09-30 LAB — SARS CORONAVIRUS 2 BY RT PCR (HOSPITAL ORDER, PERFORMED IN ~~LOC~~ HOSPITAL LAB): SARS Coronavirus 2: NEGATIVE

## 2019-09-30 MED ORDER — METRONIDAZOLE IN NACL 5-0.79 MG/ML-% IV SOLN
500.0000 mg | Freq: Three times a day (TID) | INTRAVENOUS | Status: DC
  Administered 2019-09-30 – 2019-10-01 (×4): 500 mg via INTRAVENOUS
  Filled 2019-09-30 (×4): qty 100

## 2019-09-30 MED ORDER — SODIUM CHLORIDE 0.9 % IV SOLN
2.0000 g | Freq: Two times a day (BID) | INTRAVENOUS | Status: DC
  Administered 2019-09-30 – 2019-10-01 (×2): 2 g via INTRAVENOUS
  Filled 2019-09-30 (×3): qty 2

## 2019-09-30 MED ORDER — SODIUM CHLORIDE 0.9 % IV SOLN
2.0000 g | Freq: Once | INTRAVENOUS | Status: AC
  Administered 2019-09-30: 2 g via INTRAVENOUS
  Filled 2019-09-30: qty 2

## 2019-09-30 MED ORDER — CARVEDILOL 6.25 MG PO TABS
6.2500 mg | ORAL_TABLET | Freq: Two times a day (BID) | ORAL | Status: DC
  Administered 2019-09-30 – 2019-10-03 (×6): 6.25 mg via ORAL
  Filled 2019-09-30 (×6): qty 1

## 2019-09-30 MED ORDER — SODIUM CHLORIDE 0.9 % IV SOLN
1000.0000 mL | INTRAVENOUS | Status: DC
  Administered 2019-09-30: 1000 mL via INTRAVENOUS

## 2019-09-30 MED ORDER — ONDANSETRON HCL 4 MG PO TABS: 4.0000 mg | ORAL_TABLET | Freq: Four times a day (QID) | ORAL | Status: DC | PRN

## 2019-09-30 MED ORDER — GUAIFENESIN 100 MG/5ML PO SOLN
5.0000 mL | ORAL | Status: DC | PRN
  Filled 2019-09-30: qty 5

## 2019-09-30 MED ORDER — POLYETHYLENE GLYCOL 3350 17 G PO PACK
17.0000 g | PACK | Freq: Every day | ORAL | Status: DC
  Filled 2019-09-30 (×3): qty 1

## 2019-09-30 MED ORDER — AMLODIPINE BESYLATE 5 MG PO TABS
5.0000 mg | ORAL_TABLET | Freq: Every day | ORAL | Status: DC
  Administered 2019-09-30 – 2019-10-03 (×4): 5 mg via ORAL
  Filled 2019-09-30 (×5): qty 1

## 2019-09-30 MED ORDER — VANCOMYCIN HCL IN DEXTROSE 1-5 GM/200ML-% IV SOLN
1000.0000 mg | INTRAVENOUS | Status: DC
  Administered 2019-10-01: 1000 mg via INTRAVENOUS
  Filled 2019-09-30: qty 200

## 2019-09-30 MED ORDER — SODIUM CHLORIDE 0.9 % IV SOLN: INTRAVENOUS | Status: AC

## 2019-09-30 MED ORDER — INSULIN ASPART 100 UNIT/ML ~~LOC~~ SOLN
0.0000 [IU] | Freq: Three times a day (TID) | SUBCUTANEOUS | Status: DC
  Administered 2019-09-30 (×2): 1 [IU] via SUBCUTANEOUS

## 2019-09-30 MED ORDER — GUAIFENESIN-DM 100-10 MG/5ML PO SYRP
5.0000 mL | ORAL_SOLUTION | ORAL | Status: DC | PRN
  Administered 2019-09-30: 5 mL via ORAL
  Filled 2019-09-30: qty 5

## 2019-09-30 MED ORDER — ASPIRIN EC 81 MG PO TBEC
81.0000 mg | DELAYED_RELEASE_TABLET | Freq: Every day | ORAL | Status: DC
  Administered 2019-09-30 – 2019-10-03 (×4): 81 mg via ORAL
  Filled 2019-09-30 (×5): qty 1

## 2019-09-30 MED ORDER — METRONIDAZOLE IN NACL 5-0.79 MG/ML-% IV SOLN
500.0000 mg | Freq: Once | INTRAVENOUS | Status: AC
  Administered 2019-09-30: 500 mg via INTRAVENOUS
  Filled 2019-09-30: qty 100

## 2019-09-30 MED ORDER — SODIUM CHLORIDE 0.9 % IV BOLUS (SEPSIS)
1000.0000 mL | Freq: Once | INTRAVENOUS | Status: AC
  Administered 2019-09-30: 1000 mL via INTRAVENOUS

## 2019-09-30 MED ORDER — ENOXAPARIN SODIUM 40 MG/0.4ML ~~LOC~~ SOLN
40.0000 mg | Freq: Every day | SUBCUTANEOUS | Status: DC
  Administered 2019-09-30 – 2019-10-03 (×4): 40 mg via SUBCUTANEOUS
  Filled 2019-09-30 (×4): qty 0.4

## 2019-09-30 MED ORDER — ACETAMINOPHEN 500 MG PO TABS
1000.0000 mg | ORAL_TABLET | Freq: Once | ORAL | Status: AC
  Administered 2019-09-30: 1000 mg via ORAL
  Filled 2019-09-30: qty 2

## 2019-09-30 MED ORDER — VANCOMYCIN HCL 1500 MG/300ML IV SOLN
1500.0000 mg | Freq: Once | INTRAVENOUS | Status: AC
  Administered 2019-09-30: 1500 mg via INTRAVENOUS
  Filled 2019-09-30 (×2): qty 300

## 2019-09-30 MED ORDER — MAGNESIUM OXIDE 400 (241.3 MG) MG PO TABS
400.0000 mg | ORAL_TABLET | Freq: Every day | ORAL | Status: DC
  Administered 2019-09-30 – 2019-10-03 (×4): 400 mg via ORAL
  Filled 2019-09-30 (×4): qty 1

## 2019-09-30 MED ORDER — VANCOMYCIN HCL IN DEXTROSE 1-5 GM/200ML-% IV SOLN: 1000.0000 mg | Freq: Once | INTRAVENOUS | Status: DC

## 2019-09-30 MED ORDER — ONDANSETRON HCL 4 MG/2ML IJ SOLN: 4.0000 mg | Freq: Four times a day (QID) | INTRAMUSCULAR | Status: DC | PRN

## 2019-09-30 NOTE — Progress Notes (Signed)
Pharmacy Antibiotic Note  Gloria Murphy is a 84 y.o. female admitted on 09/30/2019 with sepsis.  Pharmacy has been consulted for vancomycin and cefepime dosing.  Presenting with increased weakness and chills - WBC 6.5, LA 1.7, temp 102.1. Scr 0.77 (CrCl 57 mL/min).  Plan: Vancomycin 1500 mg IV once then 1000 mg IV every 24 hours   Cefepime 2g IV every 12 hours Monitor renal fx, cx results, clinical pic, and vanc levels as appropriate    Height: 5\' 9"  (175.3 cm) Weight: 68.9 kg (152 lb) IBW/kg (Calculated) : 66.2  Temp (24hrs), Avg:102.1 F (38.9 C), Min:102.1 F (38.9 C), Max:102.1 F (38.9 C)  No results for input(s): WBC, CREATININE, LATICACIDVEN, VANCOTROUGH, VANCOPEAK, VANCORANDOM, GENTTROUGH, GENTPEAK, GENTRANDOM, TOBRATROUGH, TOBRAPEAK, TOBRARND, AMIKACINPEAK, AMIKACINTROU, AMIKACIN in the last 168 hours.  CrCl cannot be calculated (Patient's most recent lab result is older than the maximum 21 days allowed.).    No Known Allergies  Antimicrobials this admission: Vancomycin 5/24 >>  Cefepime 5/24 >>  Metronidazole 5/24 x1  Dose adjustments this admission: N/A  Microbiology results: 5/24 BCx: sent  5/24 UCx: sent    Thank you for allowing pharmacy to be a part of this patient's care.  Antonietta Jewel, PharmD, BCCCP Clinical Pharmacist  09/30/2019 1:47 AM  Please check AMION for all [facility] phone numbers After 10:00 PM, call [place] [phone]

## 2019-09-30 NOTE — Consult Note (Signed)
Consultation Note Date: 09/30/2019   Patient Name: Gloria Murphy  DOB: 05-29-1935  MRN: [id number]  Age / Sex: 84 y.o., female  PCP: Gayland Curry, DO Referring Physician: Hosie Poisson, MD  Reason for Consultation: Establishing goals of care  HPI/Patient Profile: 84 y.o. female  with past medical history of hypertension, diabetes mellitus type 2, anemia who was most recently admitted in February for small bowel obstruction and treated for respiration failure secondary to aspiration pneumonia. Her daughter called EMS today for a 2 day history of fever, chills and nonproductive cough.  She denies headache, chest pain, shortness of breath, nausea, vomiting, diarrhea, constipation or  Dysuria. In the ED her initial  temperature was 102 and she was tachycardia. Chest x-ray showed chronic changes. UA was unremarkable. COVID test was negative. When the nurse examined her she had some abdominal tenderness and a KUB was order which was also unremarkable. She met SIRS criteria, blood cultures were drawn and empiric antibiotics started. Her Hemoglobin is 10.9 which is consistent with her chronic anemia. Lactic Acid was normal. She is waiting for an admission bed.  She is admitted on 09/30/2019 with SIRS, source not clear.    I assessed   Gloria Murphy in the ED room 4 on 5/24. Her husband was not present at the time I initially saw  her. I reviewed her care with Gwen Her. RN. Benjamine Mola  called me when the husband returned and I met with him and his wife again and reviewed our earlier conversation. He reports that she is confused at times, not frequently, but is fully oriented otherwise. He is aware of an agrees with her wishes.   Clinical Assessment and Goals of Care:  I have reviewed VS, medical records including EPIC notes, labs and imaging, and  assessed the patient.   I met with Gloria Murphy and her husband at bedside to  further discuss diagnosis prognosis, GOC, EOL wishes, disposition and options.   I introduced Palliative Medicine as specialized medical care for people living with serious illness. It focuses on providing relief from the symptoms and stress of a serious illness. The goal is to improve quality of life for both the patient and the family. I met with  [name] to discuss our conversation, explain Palliative Medicine and provide support to him.  I asked Gloria Murphy  to tell me about herself. 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 She is married to her third husband. They were married 3 years ago. She has 3 daughters. Kenney Houseman lives locally and works full time. The other two daughters live in [place] but have been visiting more frequently and one has worked remotely at Gloria Murphy's house. Mr. Sultan expresses difficulty with keeping up with the cooking and all of the housework. The daughters help when they are in town.  A detailed discussion was had today regarding advanced directives.  Concepts specific to code status, artifical feeding and hydration, continued IV antibiotics and rehospitalization was  had.  The difference between non aggressive medical intervention path  and a palliative comfort care path for this patient at this time was had. Values and goals of care important to patient and family were attempted to be elicited. They want her to live as long as possible but to be comfortable. They are willing to be admitted in the future but like to minimize time not at home. She gets joy from taking rides in the car but has to been able to do this much lately. They are agreeable to palliative care following as an outpatient.  Discussed the importance of continued conversation with family and their  medical providers regarding overall plan of care and treatment options, ensuring decisions are within the context of the patients values and GOCs.  Decision Maker:  Patient Medical POA paperwork in file.   SUMMARY OF RECOMMENDATIONS    Code Status/Advance Care Planning:  DNR   Desires IVF and antibiotics  No dialysis  No feeding tube  Healthcare Power Of Attorney document is on file.    Symptom Management:   Fatigue- may benefit from [facility] with PT, OT at discharge to increase stamina.  Palliative Prophylaxis:   Aspiration and Bowel Regimen  Additional Recommendations (Limitations, Scope, Preferences):  Full Scope Treatment  Psycho-social/Spiritual:   Desire for further Chaplaincy support:No she has her own pastor, Protestant religion  Additional Recommendations: Caregiving  Support/Resources  Prognosis:   Unable to determine  Discharge Planning: Home with Home Health Palliative care as outpatient- Please order at discharge.  Would like more information on meals on wheels program- Transitions of care team notified.     Primary Diagnoses: Present on Admission: . SIRS (systemic inflammatory response syndrome) (HCC) . Essential hypertension . Anemia   I have reviewed the medical record, interviewed the patient and family, and examined the patient. The following aspects are pertinent.  Past Medical History:  Diagnosis Date  . Diabetes mellitus without complication ([place])   . High cholesterol   . Hypertension   . Thyroid disease    Social History   Socioeconomic History  . Marital status: Married    Spouse name: Not on file  . Number of children: 3  . Years of education: 36  . Highest education level: High school graduate  Occupational History  . Occupation: Retired  Tobacco Use  . Smoking status: Never Smoker  . Smokeless tobacco: Never Used  Substance and Sexual Activity  . Alcohol use: No  . Drug use: No  . Sexual activity: Not on file  Other Topics Concern  . Not on file  Social History Narrative   Lives at home with her husband.   Right-handed.   Occasional caffeine.   Social Determinants of  Health   Financial Resource Strain:   . Difficulty of Paying Living Expenses:   Food Insecurity:   . Worried About Charity fundraiser in the Last Year:   . Arboriculturist in the Last Year:   Transportation Needs:   . Film/video editor (Medical):   [name] Lack of Transportation (Non-Medical):   Physical Activity:   . Days of Exercise per Week:   . Minutes of Exercise per Session:   Stress:   . Feeling of Stress :   Social Connections:   . Frequency of Communication with Friends and Family:   . Frequency of Social Gatherings with Friends and Family:   . Attends Religious Services:   . Active Member of Clubs or Organizations:   .  Attends Archivist Meetings:   [name] Marital Status:    Family History  Problem Relation Age of Onset  . Heart disease Mother   . Diabetes Mother   . Kidney failure Father   . Breast cancer Neg Hx    Scheduled Meds: . amLODipine  5 mg Oral Daily  . aspirin EC  81 mg Oral Daily  . carvedilol  6.25 mg Oral BID WC  . enoxaparin (LOVENOX) injection  40 mg Subcutaneous Daily  . insulin aspart  0-9 Units Subcutaneous TID WC  . magnesium oxide  400 mg Oral Daily  . polyethylene glycol  17 g Oral Daily   Continuous Infusions: . sodium chloride 75 mL/hr at 09/30/19 0546  . ceFEPime (MAXIPIME) IV    . metronidazole Stopped (09/30/19 1022)  . [START ON 10/01/2019] vancomycin     PRN Meds:.guaiFENesin-dextromethorphan, ondansetron **OR** ondansetron (ZOFRAN) IV Medications Prior to Admission:  Prior to Admission medications   Medication Sig Start Date End Date Taking? Authorizing Provider  amLODipine (NORVASC) 5 MG tablet Take 5 mg by mouth daily. 09/13/19  Yes [provider]  aspirin EC 81 MG tablet Take 1 tablet (81 mg total) by mouth daily. 10/26/18  Yes Miquel Dunn, NP  Calcium Carb-Cholecalciferol (CALTRATE 600+D3 PO) Take 1 tablet by mouth daily.   Yes [provider]  carboxymethylcellulose (REFRESH TEARS) 0.5 %  SOLN Place 1 drop into both eyes 3 (three) times daily.   Yes [provider]  carvedilol (COREG) 6.25 MG tablet TAKE 1 TABLET(6.25 MG) BY MOUTH TWICE DAILY WITH A MEAL Patient taking differently: Take 6.25 mg by mouth 2 (two) times daily with a meal.  09/13/19  Yes Reed, Tiffany L, DO  chlorhexidine (PERIDEX) 0.12 % solution 15 mLs by Mouth Rinse route 2 (two) times daily. 07/23/19  Yes Pokhrel, Laxman, MD  famotidine (PEPCID) 20 MG tablet Take 20 mg by mouth as needed.    Yes [provider]  furosemide (LASIX) 20 MG tablet Take 1/2 tablet ( 10 mg) one by mouth daily.Hold if B/p Less than 90/60 Patient taking differently: Take 20 mg by mouth daily. Hold if B/p Less than 90/60 09/26/19  Yes Ngetich, Dinah C, NP  MAGNESIUM OXIDE PO Take 1 tablet by mouth daily.    Yes [provider]  polyethylene glycol (MIRALAX / GLYCOLAX) 17 g packet Take 17 g by mouth daily. Hold for diarrhoea 07/23/19  Yes Pokhrel, Laxman, MD  potassium chloride SA (KLOR-CON) 20 MEQ tablet Take 1 tablet (20 mEq total) by mouth daily. 09/20/19  Yes Ngetich, Dinah C, NP   No Known Allergies Review of Systems  Constitutional: Positive for activity change, chills and fever.  HENT: Negative.   Eyes: Negative.   Respiratory: Negative.  Negative for cough, chest tightness, shortness of breath and wheezing.   Cardiovascular: Positive for leg swelling. Negative for chest pain and palpitations.  Gastrointestinal: Negative for abdominal distention, abdominal pain, constipation, diarrhea and nausea.  Endocrine: Negative.   Genitourinary: Negative for difficulty urinating, dysuria, flank pain, frequency and urgency.  Musculoskeletal: Negative.   Skin: Negative.   Allergic/Immunologic: Negative.   Neurological: Negative.   Hematological: Negative.   Psychiatric/Behavioral: Negative.   Family reported cough in earlier notes although patient denies.    Physical Exam Vitals and nursing note reviewed.   Constitutional:      General: She is not in acute distress.    Appearance: She is not ill-appearing.  HENT:     Head:  Normocephalic and atraumatic.  Cardiovascular:     Rate and Rhythm: Normal rate and regular rhythm.  Pulmonary:     Effort: Pulmonary effort is normal.     Breath sounds: Normal breath sounds.  Chest:     Chest wall: No tenderness.  Abdominal:     General: Bowel sounds are normal. There is no distension.     Palpations: Abdomen is soft.     Tenderness: There is no guarding.  Musculoskeletal:     Comments: No peripheral edema  Skin:    General: Skin is warm and dry.     Capillary Refill: Capillary refill takes less than 2 seconds.     Findings: No bruising.  Neurological:     Mental Status: She is alert.     GCS: GCS eye subscore is 4. GCS verbal subscore is 4. GCS motor subscore is 6.     Comments: Oriented to person and place but stated correct Month May but year as 1921  Psychiatric:        Attention and Perception: Attention and perception normal.     Comments: Hard of hearing      Vital Signs: BP 139/74   Pulse 93   Temp 97.8 F (36.6 C) (Oral)   Resp (!) 30   Ht 5' 9" (1.753 m)   Wt 68.9 kg   SpO2 100%   BMI 22.45 kg/m  Pain Scale: 0-10   Pain Score: 0-No pain   SpO2: SpO2: 100 % O2 Device:SpO2: 100 % O2 Flow Rate: .   IO: Intake/output summary:   Intake/Output Summary (Last 24 hours) at 09/30/2019 1105 Last data filed at 09/30/2019 0311 Gross per 24 hour  Intake 2800 ml  Output --  Net 2800 ml    LBM:   Baseline Weight: Weight: 68.9 kg Most recent weight: Weight: 68.9 kg     Palliative Assessment/Data: 60%  She has had significant decrease since February hospitalization.      Time In: 0955 Time Out: 1110 Time Total: 45mnutes Greater than 50%  of this time was spent counseling and coordinating care related to the above assessment and plan.  Signed by: ADonalynn Furlong NP    Please contact Palliative Medicine Team  phone at [phone] questions and concerns.  For individual provider: See Amion  Please utilize secure chat with additional questions, if there is no response within 30 minutes please call the above phone number  Palliative Medicine Team providers are available by phone from 7am to 7pm daily and can be reached through the team cell phone. Should this patient require assistance outside of these hours, please call the patient's attending physician.

## 2019-09-30 NOTE — ED Triage Notes (Addendum)
Pt here from home for increased weakness and chills. Pt developed fever, cough, and chills today. Pt incontinent of urine and feces. AO to self and place  104.7 F temporal 169/77 BP ST in 120s 100% 3L via [place] 40s RR 18 L forearm

## 2019-09-30 NOTE — ED Notes (Signed)
Tele  Breakfast Ordered 

## 2019-09-30 NOTE — H&P (Signed)
History and Physical    Gloria Murphy [id number] DOB: March 13, 1936 DOA: 09/30/2019  PCP: Gayland Curry, DO   Patient coming from: Home.  Chief Complaint: Fever and cough and weakness.  HPI: Gloria Murphy is a 84 y.o. female with history of hypertension diabetes mellitus type 2 anemia was admitted in February for small bowel obstruction and at that time also was treated for respiratory failure secondary to recommend aspiration pneumonia presents to the ER because of 2 days of fever chills and nonproductive cough.  Denies any nausea vomiting diarrhea dysuria or discharge headache or any chest pain.  ED Course: In the ER patient was febrile temperature 102 F tachycardic with chest x-ray showing chronic changes.  UA unremarkable.  Covid test was negative.  Given the persistent fever and tachycardia patient had blood cultures drawn and started empiric antibiotics.  Labs are significant for hemoglobin of 10.9 which is chronic.  Lactic acid was normal.  Review of Systems: As per HPI, rest all negative.   Past Medical History:  Diagnosis Date  . Diabetes mellitus without complication ([place])   . High cholesterol   . Hypertension   . Thyroid disease     Past Surgical History:  Procedure Laterality Date  . ABDOMINAL HYSTERECTOMY    . EYE SURGERY Bilateral 06/2016   Cataract  . KNEE SURGERY Right   . LAPAROTOMY N/A 08/31/2017   Procedure: EXPLORATORY LAPAROTOMY; BILATERAL SALPINGOOPHERECTOMY; OMENTECTOMY, TUMOR DEBULKING;  Surgeon: Everitt Amber, MD;  Location: [facility];  Service: Gynecology;  Laterality: N/A;  . LAPAROTOMY N/A 07/01/2019   Procedure: EXPLORATORY LAPAROTOMY;  Surgeon: Jovita Kussmaul, MD;  Location: [facility];  Service: General;  Laterality: N/A;  . MASS EXCISION N/A 08/31/2017   Procedure: RESECTION OF PELVIC MASS;  Surgeon: Everitt Amber, MD;  Location: [facility];  Service: Gynecology;  Laterality: N/A;  . RIGHT HEART CATH N/A 02/11/2019   Procedure: RIGHT HEART CATH;  Surgeon:  Nigel Mormon, MD;  Location: [facility];  Service: Cardiovascular;  Laterality: N/A;  . SMALL INTESTINE SURGERY       reports that she has never smoked. She has never used smokeless tobacco. She reports that she does not drink alcohol or use drugs.  No Known Allergies  Family History  Problem Relation Age of Onset  . Heart disease Mother   . Diabetes Mother   . Kidney failure Father   . Breast cancer Neg Hx     Prior to Admission medications   Medication Sig Start Date End Date Taking? Authorizing Provider  amLODipine (NORVASC) 5 MG tablet Take 5 mg by mouth daily. 09/13/19   [provider]  aspirin EC 81 MG tablet Take 1 tablet (81 mg total) by mouth daily. 10/26/18   Miquel Dunn, NP  Calcium Carb-Cholecalciferol (CALTRATE 600+D3 PO) Take 1 tablet by mouth daily.    [provider]  carboxymethylcellulose (REFRESH TEARS) 0.5 % SOLN Place 1 drop into both eyes 3 (three) times daily.    [provider]  carvedilol (COREG) 6.25 MG tablet TAKE 1 TABLET(6.25 MG) BY MOUTH TWICE DAILY WITH A MEAL 09/13/19   Reed, Tiffany L, DO  chlorhexidine (PERIDEX) 0.12 % solution 15 mLs by Mouth Rinse route 2 (two) times daily. 07/23/19   Pokhrel, Corrie Mckusick, MD  famotidine (PEPCID) 20 MG tablet Take 20 mg by mouth as needed.     [provider]  furosemide (LASIX) 20 MG tablet Take 1/2 tablet ( 10 mg) one  by mouth daily.Hold if B/p Less than 90/60 09/26/19   Ngetich, Dinah C, NP  MAGNESIUM OXIDE PO Take by mouth daily.    [provider]  polyethylene glycol (MIRALAX / GLYCOLAX) 17 g packet Take 17 g by mouth daily. Hold for diarrhoea 07/23/19   Pokhrel, Corrie Mckusick, MD  potassium chloride SA (KLOR-CON) 20 MEQ tablet Take 1 tablet (20 mEq total) by mouth daily. 09/20/19   Ngetich, Nelda Bucks, NP    Physical Exam: Constitutional: Moderately built and nourished. Vitals:   09/30/19 0345 09/30/19 0400 09/30/19 0415 09/30/19 0430  BP: 117/62 113/68  120/66 119/65  Pulse: 96 93 91 91  Resp: (!) 25 (!) 24 (!) 39 (!) 37  Temp:      TempSrc:      SpO2: 100% 100% 100% 100%  Weight:      Height:       Eyes: Anicteric no pallor. ENMT: No discharge from the ears eyes nose or mouth. Neck: No mass felt.  No neck rigidity. Respiratory: No rhonchi or crepitations. Cardiovascular: S1-S2 heard. Abdomen: Soft nontender bowel sounds present. Musculoskeletal: No edema. Skin: No rash. Neurologic: Alert awake oriented time place and person.  Moves all extremities. Psychiatric: Appears normal.  Normal affect.   Labs on Admission: I have personally reviewed following labs and imaging studies  CBC: Recent Labs  Lab 09/30/19 0025  WBC 6.5  NEUTROABS 5.0  HGB 10.9*  HCT 34.5*  MCV 82.1  PLT 99991111   Basic Metabolic Panel: Recent Labs  Lab 09/30/19 0025  NA 136  K 4.1  CL 106  CO2 20*  GLUCOSE 143*  BUN 11  CREATININE 0.77  CALCIUM 8.8*   GFR: Estimated Creatinine Clearance: 57.6 mL/min (by C-G formula based on SCr of 0.77 mg/dL). Liver Function Tests: Recent Labs  Lab 09/30/19 0025  AST 26  ALT 16  ALKPHOS 77  BILITOT 0.5  PROT 7.6  ALBUMIN 2.5*   No results for input(s): LIPASE, AMYLASE in the last 168 hours. No results for input(s): AMMONIA in the last 168 hours. Coagulation Profile: Recent Labs  Lab 09/30/19 0025  INR 1.1   Cardiac Enzymes: No results for input(s): CKTOTAL, CKMB, CKMBINDEX, TROPONINI in the last 168 hours. BNP (last 3 results) No results for input(s): PROBNP in the last 8760 hours. HbA1C: No results for input(s): HGBA1C in the last 72 hours. CBG: No results for input(s): GLUCAP in the last 168 hours. Lipid Profile: No results for input(s): CHOL, HDL, LDLCALC, TRIG, CHOLHDL, LDLDIRECT in the last 72 hours. Thyroid Function Tests: No results for input(s): TSH, T4TOTAL, FREET4, T3FREE, THYROIDAB in the last 72 hours. Anemia Panel: No results for input(s): VITAMINB12, FOLATE, FERRITIN, TIBC,  IRON, RETICCTPCT in the last 72 hours. Urine analysis:    Component Value Date/Time   COLORURINE YELLOW 09/30/2019 0030   APPEARANCEUR CLEAR 09/30/2019 0030   LABSPEC 1.015 09/30/2019 0030   PHURINE 7.0 09/30/2019 0030   GLUCOSEU NEGATIVE 09/30/2019 0030   HGBUR NEGATIVE 09/30/2019 0030   BILIRUBINUR NEGATIVE 09/30/2019 0030   KETONESUR NEGATIVE 09/30/2019 0030   PROTEINUR 30 (A) 09/30/2019 0030   UROBILINOGEN 0.2 10/25/2017 1931   NITRITE NEGATIVE 09/30/2019 0030   LEUKOCYTESUR NEGATIVE 09/30/2019 0030   Sepsis Labs: @LABRCNTIP (procalcitonin:4,lacticidven:4) ) Recent Results (from the past 240 hour(s))  SARS Coronavirus 2 by RT PCR (hospital order, performed in [facility] lab) Nasopharyngeal Nasopharyngeal Swab     Status: None   Collection Time: 09/30/19  2:34 AM   Specimen:  Nasopharyngeal Swab  Result Value Ref Range Status   SARS Coronavirus 2 NEGATIVE NEGATIVE Final    Comment: (NOTE) SARS-CoV-2 target nucleic acids are NOT DETECTED. The SARS-CoV-2 RNA is generally detectable in upper and lower respiratory specimens during the acute phase of infection. The lowest concentration of SARS-CoV-2 viral copies this assay can detect is 250 copies / mL. A negative result does not preclude SARS-CoV-2 infection and should not be used as the sole basis for treatment or other patient management decisions.  A negative result may occur with improper specimen collection / handling, submission of specimen other than nasopharyngeal swab, presence of viral mutation(s) within the areas targeted by this assay, and inadequate number of viral copies (<250 copies / mL). A negative result must be combined with clinical observations, patient history, and epidemiological information. Fact Sheet for Patients:   StrictlyIdeas.no Fact Sheet for Healthcare Providers: BankingDealers.co.za This test is not yet approved or cleared  by the [place] FDA and has been authorized for detection and/or diagnosis of SARS-CoV-2 by FDA under an Emergency Use Authorization (EUA).  This EUA will remain in effect (meaning this test can be used) for the duration of the COVID-19 declaration under Section 564(b)(1) of the Act, 21 U.S.C. section 360bbb-3(b)(1), unless the authorization is terminated or revoked sooner. Performed at [facility], [place] [address], [place], [place] [address]      Radiological Exams on Admission: DG Chest 2 View  Result Date: 09/30/2019 CLINICAL DATA:  Suspected sepsis EXAM: CHEST - 2 VIEW COMPARISON:  Radiograph 07/15/2019 FINDINGS: Slight hyperinflation with flattening of the diaphragms. Coarse fine reticular opacities throughout the lungs are similar to comparison exams. No consolidation, features of edema, pneumothorax, or effusion. Pulmonary vascularity remains well-defined. The cardiomediastinal contours are unremarkable. No acute osseous or soft tissue abnormality. Degenerative changes are present in the imaged spine and shoulders. Telemetry leads overlie the chest. IMPRESSION: Chronically coarsened interstitial changes in the lungs with hyperinflation. No acute cardiopulmonary abnormality. Electronically Signed   By: Lovena Le M.D.   On: 09/30/2019 01:17      Assessment/Plan Principal Problem:   SIRS (systemic inflammatory response syndrome) (HCC) Active Problems:   Essential hypertension   Type 2 diabetes mellitus without complication, without long-term current use of insulin (HCC)   Anemia    1. SIRS source not clear.  Patient placed on empiric antibiotics follow cultures.  Patient does have history of recurrent aspiration pneumonia but at this time no different signs of it. 2. Hypertension on Coreg and amlodipine. 3. Diabetes mellitus type 2 on diet we will keep patient sliding scale coverage. 4. Chronic anemia follow CBC. 5. Patient admitted for small bowel obstruction requiring surgery.   Abdomen remains benign at this time.  Closely monitor.  Since patient has SIRS picture will admit for more than 2 midnight and close monitoring and inpatient status.   DVT prophylaxis: Lovenox. Code Status: Full code. Family Communication: Discussed with patient. Disposition Plan: Home. Consults called: None. Admission status: Inpatient.   Rise Patience MD [facility] Pager [phone].  If 7PM-7AM, please contact night-coverage [email or web address] Password [facility]  09/30/2019, 4:37 AM

## 2019-09-30 NOTE — ED Provider Notes (Signed)
[facility] EMERGENCY DEPARTMENT Provider Note  CSN: [id number] Arrival date & time: 09/30/19 0009  Chief Complaint(s) Code Sepsis  HPI Gloria Murphy is a 84 y.o. female   The history is provided by the patient and the spouse.    CC: cough  Onset/Duration: a few days Timing: intermittent. Worse today Quality: dry Severity: moderate Modifying Factors:  Improved by: nothing  Worsened by: nothing Associated Signs/Symptoms:  Pertinent (+): generalized fatigue, and mild confusion earlier this evening that has resolved since being transported to the ED, tachypnea. Patient reports a few days of dysuria.  Pertinent (-): headache, neck pain, chest pain, abd pain, N/V/D  No known sick contacts. Had Covid vaccine (2nd dose in April).  Had admission in Feb 2021 for SBO requiring adhesionlysis, also had aspiration PNA at that time.  Past Medical History Past Medical History:  Diagnosis Date  . Diabetes mellitus without complication ([place])   . High cholesterol   . Hypertension   . Thyroid disease    Patient Active Problem List   Diagnosis Date Noted  . Palliative care by specialist   . Goals of care, counseling/discussion   . Pressure injury of skin 07/16/2019  . Dysphagia, oropharyngeal 07/13/2019  . FTT (failure to thrive) in adult   . Severe protein-calorie malnutrition ([place])   . Palliative care encounter   . Sinus tachycardia 06/27/2019  . Type 2 diabetes mellitus without complication, without long-term current use of insulin ([place]) 06/26/2019  . SBO (small bowel obstruction) s/p adhesiolysis 07/01/2019 06/26/2019  . Abdominal pain 06/26/2019  . Lactic acidosis 06/26/2019  . Elevated troponin 06/26/2019  . Essential hypertension 02/22/2019  . Exertional dyspnea 02/04/2019  . Vision disturbance 12/19/2018  . TIA (transient ischemic attack) 12/19/2018  . Malignant carcinoid tumor to left adnexa 10/06/2017  . Elevated CA-125 08/31/2017   Home  Medication(s) Prior to Admission medications   Medication Sig Start Date End Date Taking? Authorizing Provider  amLODipine (NORVASC) 5 MG tablet Take 5 mg by mouth daily. 09/13/19   [provider]  aspirin EC 81 MG tablet Take 1 tablet (81 mg total) by mouth daily. 10/26/18   Miquel Dunn, NP  Calcium Carb-Cholecalciferol (CALTRATE 600+D3 PO) Take 1 tablet by mouth daily.    [provider]  carboxymethylcellulose (REFRESH TEARS) 0.5 % SOLN Place 1 drop into both eyes 3 (three) times daily.    [provider]  carvedilol (COREG) 6.25 MG tablet TAKE 1 TABLET(6.25 MG) BY MOUTH TWICE DAILY WITH A MEAL 09/13/19   Reed, Tiffany L, DO  chlorhexidine (PERIDEX) 0.12 % solution 15 mLs by Mouth Rinse route 2 (two) times daily. 07/23/19   Pokhrel, Corrie Mckusick, MD  famotidine (PEPCID) 20 MG tablet Take 20 mg by mouth as needed.     [provider]  furosemide (LASIX) 20 MG tablet Take 1/2 tablet ( 10 mg) one by mouth daily.Hold if B/p Less than 90/60 09/26/19   Ngetich, Dinah C, NP  MAGNESIUM OXIDE PO Take by mouth daily.    [provider]  polyethylene glycol (MIRALAX / GLYCOLAX) 17 g packet Take 17 g by mouth daily. Hold for diarrhoea 07/23/19   Pokhrel, Corrie Mckusick, MD  potassium chloride SA (KLOR-CON) 20 MEQ tablet Take 1 tablet (20 mEq total) by mouth daily. 09/20/19   Ngetich, Nelda Bucks, NP  Past Surgical History Past Surgical History:  Procedure Laterality Date  . ABDOMINAL HYSTERECTOMY    . EYE SURGERY Bilateral 06/2016   Cataract  . KNEE SURGERY Right   . LAPAROTOMY N/A 08/31/2017   Procedure: EXPLORATORY LAPAROTOMY; BILATERAL SALPINGOOPHERECTOMY; OMENTECTOMY, TUMOR DEBULKING;  Surgeon: Everitt Amber, MD;  Location: [facility];  Service: Gynecology;  Laterality: N/A;  . LAPAROTOMY N/A 07/01/2019   Procedure: EXPLORATORY LAPAROTOMY;   Surgeon: Jovita Kussmaul, MD;  Location: [facility];  Service: General;  Laterality: N/A;  . MASS EXCISION N/A 08/31/2017   Procedure: RESECTION OF PELVIC MASS;  Surgeon: Everitt Amber, MD;  Location: [facility];  Service: Gynecology;  Laterality: N/A;  . RIGHT HEART CATH N/A 02/11/2019   Procedure: RIGHT HEART CATH;  Surgeon: Nigel Mormon, MD;  Location: [facility];  Service: Cardiovascular;  Laterality: N/A;  . SMALL INTESTINE SURGERY     Family History Family History  Problem Relation Age of Onset  . Heart disease Mother   . Diabetes Mother   . Kidney failure Father   . Breast cancer Neg Hx     Social History Social History   Tobacco Use  . Smoking status: Never Smoker  . Smokeless tobacco: Never Used  Substance Use Topics  . Alcohol use: No  . Drug use: No   Allergies Patient has no known allergies.  Review of Systems Review of Systems All other systems are reviewed and are negative for acute change except as noted in the HPI  Physical Exam Vital Signs  I have reviewed the triage vital signs BP (!) 150/78   Pulse (!) 120   Temp (!) 102.1 F (38.9 C) (Rectal)   Resp (!) 32   Ht 5\' 9"  (1.753 m)   Wt 68.9 kg   SpO2 100%   BMI 22.45 kg/m   Physical Exam Vitals reviewed.  Constitutional:      General: She is not in acute distress.    Appearance: She is well-developed. She is not diaphoretic.  HENT:     Head: Normocephalic and atraumatic.     Nose: Nose normal.  Eyes:     General: No scleral icterus.       Right eye: No discharge.        Left eye: No discharge.     Conjunctiva/sclera: Conjunctivae normal.     Pupils: Pupils are equal, round, and reactive to light.  Cardiovascular:     Rate and Rhythm: Regular rhythm. Tachycardia present.     Heart sounds: No murmur. No friction rub. No gallop.   Pulmonary:     Effort: Pulmonary effort is normal. No respiratory distress.     Breath sounds: Normal breath sounds. No stridor. No rales.  Abdominal:      General: There is no distension.     Palpations: Abdomen is soft.     Tenderness: There is no abdominal tenderness.  Musculoskeletal:        General: No tenderness.     Cervical back: Normal range of motion and neck supple.  Skin:    General: Skin is warm and dry.     Findings: No erythema or rash.  Neurological:     Mental Status: She is alert and oriented to person, place, and time.     Comments: Oriented but slow to respond     ED Results and Treatments Labs (all labs ordered are listed, but only abnormal results are displayed) Labs Reviewed  COMPREHENSIVE METABOLIC PANEL - Abnormal; Notable  for the following components:      Result Value   CO2 20 (*)    Glucose, Bld 143 (*)    Calcium 8.8 (*)    Albumin 2.5 (*)    All other components within normal limits  CBC WITH DIFFERENTIAL/PLATELET - Abnormal; Notable for the following components:   Hemoglobin 10.9 (*)    HCT 34.5 (*)    RDW 17.8 (*)    Lymphs Abs 0.6 (*)    Abs Immature Granulocytes 0.13 (*)    All other components within normal limits  URINALYSIS, ROUTINE W REFLEX MICROSCOPIC - Abnormal; Notable for the following components:   Protein, ur 30 (*)    All other components within normal limits  CULTURE, BLOOD (ROUTINE X 2)  CULTURE, BLOOD (ROUTINE X 2)  URINE CULTURE  LACTIC ACID, PLASMA  LACTIC ACID, PLASMA  PROTIME-INR  APTT  POC SARS CORONAVIRUS 2 AG -  ED                                                                                                                         EKG    Radiology DG Chest 2 View  Result Date: 09/30/2019 CLINICAL DATA:  Suspected sepsis EXAM: CHEST - 2 VIEW COMPARISON:  Radiograph 07/15/2019 FINDINGS: Slight hyperinflation with flattening of the diaphragms. Coarse fine reticular opacities throughout the lungs are similar to comparison exams. No consolidation, features of edema, pneumothorax, or effusion. Pulmonary vascularity remains well-defined. The cardiomediastinal contours  are unremarkable. No acute osseous or soft tissue abnormality. Degenerative changes are present in the imaged spine and shoulders. Telemetry leads overlie the chest. IMPRESSION: Chronically coarsened interstitial changes in the lungs with hyperinflation. No acute cardiopulmonary abnormality. Electronically Signed   By: Lovena Le M.D.   On: 09/30/2019 01:17    Pertinent labs & imaging results that were available during my care of the patient were reviewed by me and considered in my medical decision making (see chart for details).  Medications Ordered in ED Medications  sodium chloride 0.9 % bolus 1,000 mL (0 mLs Intravenous Stopped 09/30/19 0214)    Followed by  0.9 %  sodium chloride infusion (1,000 mLs Intravenous New Bag/Given 09/30/19 0222)  vancomycin (VANCOREADY) IVPB 1500 mg/300 mL (has no administration in time range)  vancomycin (VANCOCIN) IVPB 1000 mg/200 mL premix (has no administration in time range)  ceFEPIme (MAXIPIME) 2 g in sodium chloride 0.9 % 100 mL IVPB (has no administration in time range)  ceFEPIme (MAXIPIME) 2 g in sodium chloride 0.9 % 100 mL IVPB (0 g Intravenous Stopped 09/30/19 0159)  metroNIDAZOLE (FLAGYL) IVPB 500 mg (500 mg Intravenous New Bag/Given 09/30/19 0111)  acetaminophen (TYLENOL) tablet 1,000 mg (1,000 mg Oral Given 09/30/19 0202)  Procedures Procedures  (including critical care time)  Medical Decision Making / ED Course I have reviewed the nursing notes for this encounter and the patient's prior records (if available in EHR or on provided paperwork).   KERAH NEUGENT was evaluated in Emergency Department on 09/30/2019 for the symptoms described in the history of present illness. She was evaluated in the context of the global COVID-19 pandemic, which necessitated consideration that the patient might be at risk for infection  with the SARS-CoV-2 virus that causes COVID-19. Institutional protocols and algorithms that pertain to the evaluation of patients at risk for COVID-19 are in a state of rapid change based on information released by regulatory bodies including the CDC and federal and state organizations. These policies and algorithms were followed during the patient's care in the ED.  Code sepsis initiated given SIRS with concern for possible PNA vs UTI. Empiric Abx given. IVF - 30cc/kg not necessary at this time.  CXR w/o PNA. CBC without leukocytosis.  Improved hemoglobin from prior.  No thrombocytopenia. Metabolic panel without significant electrolyte derangements or renal sufficiency. UA without evidence of infection.   Lactic acid within normal limits.  No obvious source of infection at this time.  Possible viral process but patient is still tachycardic and tachypneic.  Though she is not hypoxic, feel she would need admission for further work-up and management.     Final Clinical Impression(s) / ED Diagnoses Final diagnoses:  Fever in other diseases  Tachycardia  Tachypnea  Cough      This chart was dictated using voice recognition software.  Despite best efforts to proofread,  errors can occur which can change the documentation meaning.   Fatima Blank, MD 09/30/19 [phone]

## 2019-09-30 NOTE — Progress Notes (Signed)
Patient was admitted earlier this morning by Dr. Fransisca Kaufmann, MD please see his note for detailed H&P.   84 year old lady with prior history of hypertension, diabetes, anemia of chronic disease, recently admitted for small bowel obstruction and aspiration pneumonia presents today for fever and chills and nonproductive cough.  On arrival to ED she was found to be febrile.  Chest x-ray does not show any new pneumonia or infiltrates, urine analysis negative negative, abdominal x-rays did not show any SBO.  Given persistent fever and tachycardia patient was empirically started on broad-spectrum IV antibiotics and referred to [facility] for admission.   Patient seen and examined at bedside She is alert and comfortable, not in any kind of distress. Cardiovascular S1-S2 heard, regular rate rhythm, no JVD Lungs clear to auscultation bilaterally Abdomen is soft, mild tenderness in the right lower quadrant, bowel sounds normal  Extremities no pedal edema   Plan Follow blood cultures and continue with empiric IV antibiotics.    Hosie Poisson, MD

## 2019-10-01 DIAGNOSIS — E440 Moderate protein-calorie malnutrition: Secondary | ICD-10-CM | POA: Insufficient documentation

## 2019-10-01 DIAGNOSIS — R5081 Fever presenting with conditions classified elsewhere: Secondary | ICD-10-CM

## 2019-10-01 DIAGNOSIS — D649 Anemia, unspecified: Secondary | ICD-10-CM

## 2019-10-01 LAB — BLOOD CULTURE ID PANEL (REFLEXED)

## 2019-10-01 LAB — IRON AND TIBC
Iron: 19 ug/dL — ABNORMAL LOW (ref 28–170)
Saturation Ratios: 13 % (ref 10.4–31.8)
TIBC: 146 ug/dL — ABNORMAL LOW (ref 250–450)
UIBC: 127 ug/dL

## 2019-10-01 LAB — FOLATE: Folate: 13.1 ng/mL (ref >5.9)

## 2019-10-01 LAB — BASIC METABOLIC PANEL
Anion gap: 8 (ref 5–15)
Anion gap: 10 (ref 5–15)
BUN: 5 mg/dL — ABNORMAL LOW (ref 8–23)
BUN: 8 mg/dL (ref 8–23)
CO2: 20 mmol/L — ABNORMAL LOW (ref 22–32)
CO2: 19 mmol/L — ABNORMAL LOW (ref 22–32)
Calcium: 8.5 mg/dL — ABNORMAL LOW (ref 8.9–10.3)
Calcium: 8.8 mg/dL — ABNORMAL LOW (ref 8.9–10.3)
Chloride: 109 mmol/L (ref 98–111)
Chloride: 109 mmol/L (ref 98–111)
Creatinine, Ser: 0.63 mg/dL (ref 0.44–1.00)
Creatinine, Ser: 0.59 mg/dL (ref 0.44–1.00)
GFR calc Af Amer: >60 mL/min (ref >60)
GFR calc Af Amer: >60 mL/min (ref >60)
GFR calc non Af Amer: >60 mL/min (ref >60)
GFR calc non Af Amer: >60 mL/min (ref >60)
Glucose, Bld: 125 mg/dL — ABNORMAL HIGH (ref 70–99)
Glucose, Bld: 172 mg/dL — ABNORMAL HIGH (ref 70–99)
Potassium: 2.6 mmol/L — CL (ref 3.5–5.1)
Potassium: 3.6 mmol/L (ref 3.5–5.1)
Sodium: 137 mmol/L (ref 135–145)
Sodium: 138 mmol/L (ref 135–145)

## 2019-10-01 LAB — VITAMIN B12: Vitamin B-12: 809 pg/mL (ref 180–914)

## 2019-10-01 LAB — URINE CULTURE: Culture: NO GROWTH

## 2019-10-01 LAB — GLUCOSE, CAPILLARY
Glucose-Capillary: 88 mg/dL (ref 70–99)
Glucose-Capillary: 114 mg/dL — ABNORMAL HIGH (ref 70–99)
Glucose-Capillary: 132 mg/dL — ABNORMAL HIGH (ref 70–99)
Glucose-Capillary: 208 mg/dL — ABNORMAL HIGH (ref 70–99)

## 2019-10-01 LAB — MAGNESIUM: Magnesium: 1.6 mg/dL — ABNORMAL LOW (ref 1.7–2.4)

## 2019-10-01 LAB — RETICULOCYTES
Immature Retic Fract: 13.9 % (ref 2.3–15.9)
RBC.: 3.79 MIL/uL — ABNORMAL LOW (ref 3.87–5.11)
Retic Count, Absolute: 39 10*3/uL (ref 19.0–186.0)
Retic Ct Pct: 1 % (ref 0.4–3.1)

## 2019-10-01 LAB — FERRITIN: Ferritin: 624 ng/mL — ABNORMAL HIGH (ref 11–307)

## 2019-10-01 LAB — TSH: TSH: 0.032 u[IU]/mL — ABNORMAL LOW (ref 0.350–4.500)

## 2019-10-01 MED ORDER — POTASSIUM CHLORIDE 10 MEQ/100ML IV SOLN
10.0000 meq | INTRAVENOUS | Status: AC
  Administered 2019-10-01 (×2): 10 meq via INTRAVENOUS
  Filled 2019-10-01 (×2): qty 100

## 2019-10-01 MED ORDER — ENSURE ENLIVE PO LIQD
237.0000 mL | Freq: Two times a day (BID) | ORAL | Status: DC
  Administered 2019-10-01 – 2019-10-03 (×4): 237 mL via ORAL

## 2019-10-01 MED ORDER — POTASSIUM CHLORIDE CRYS ER 20 MEQ PO TBCR
40.0000 meq | EXTENDED_RELEASE_TABLET | Freq: Two times a day (BID) | ORAL | Status: AC
  Administered 2019-10-01 (×2): 40 meq via ORAL
  Filled 2019-10-01 (×2): qty 2

## 2019-10-01 MED ORDER — SODIUM CHLORIDE 0.9 % IV SOLN
3.0000 g | Freq: Four times a day (QID) | INTRAVENOUS | Status: DC
  Administered 2019-10-01 – 2019-10-02 (×4): 3 g via INTRAVENOUS
  Filled 2019-10-01: qty 8
  Filled 2019-10-01: qty 3
  Filled 2019-10-01 (×2): qty 8
  Filled 2019-10-01: qty 3
  Filled 2019-10-01: qty 8

## 2019-10-01 NOTE — Progress Notes (Signed)
PHARMACY - PHYSICIAN COMMUNICATION CRITICAL VALUE ALERT - BLOOD CULTURE IDENTIFICATION (BCID)  Results for orders placed or performed during the hospital encounter of 09/30/19  Blood Culture ID Panel (Reflexed) (Collected: 09/30/2019 12:22 AM)  Result Value Ref Range   Enterococcus species NOT DETECTED NOT DETECTED   Listeria monocytogenes NOT DETECTED NOT DETECTED   Staphylococcus species NOT DETECTED NOT DETECTED   Staphylococcus aureus (BCID) NOT DETECTED NOT DETECTED   Streptococcus species NOT DETECTED NOT DETECTED   Streptococcus agalactiae NOT DETECTED NOT DETECTED   Streptococcus pneumoniae NOT DETECTED NOT DETECTED   Streptococcus pyogenes NOT DETECTED NOT DETECTED   Acinetobacter baumannii NOT DETECTED NOT DETECTED   Enterobacteriaceae species NOT DETECTED NOT DETECTED   Enterobacter cloacae complex NOT DETECTED NOT DETECTED   Escherichia coli NOT DETECTED NOT DETECTED   Klebsiella oxytoca NOT DETECTED NOT DETECTED   Klebsiella pneumoniae NOT DETECTED NOT DETECTED   Proteus species NOT DETECTED NOT DETECTED   Serratia marcescens NOT DETECTED NOT DETECTED   Haemophilus influenzae NOT DETECTED NOT DETECTED   Neisseria meningitidis NOT DETECTED NOT DETECTED   Pseudomonas aeruginosa NOT DETECTED NOT DETECTED   Candida albicans NOT DETECTED NOT DETECTED   Candida glabrata NOT DETECTED NOT DETECTED   Candida krusei NOT DETECTED NOT DETECTED   Candida parapsilosis NOT DETECTED NOT DETECTED   Candida tropicalis NOT DETECTED NOT DETECTED    1/4 bottles from blood cultures growing GPC. Patient currently on unasyn for possible aspiration pneumonia.   Name of physician (or Provider) ContactedKarleen Hampshire MD  Changes to prescribed antibiotics required: Continue unasyn for now, no changes recommended.   Erin Hearing PharmD., BCPS Clinical Pharmacist 10/01/2019 4:02 PM

## 2019-10-01 NOTE — Progress Notes (Signed)
Initial Nutrition Assessment  DOCUMENTATION CODES:   Non-severe (moderate) malnutrition in context of chronic illness  INTERVENTION:   - Liberalize diet to Regular, verbal with readback order placed per MD  - Ensure Enlive po BID, each supplement provides 350 kcal and 20 grams of protein  - Magic cup BID with meals, each supplement provides 290 kcal and 9 grams of protein  - Encourage adequate PO intake  NUTRITION DIAGNOSIS:   Moderate Malnutrition related to chronic illness as evidenced by mild fat depletion, mild muscle depletion, percent weight loss (15.1% weight loss in 7 months).  GOAL:   Patient will meet greater than or equal to 90% of their needs  MONITOR:   PO intake, Supplement acceptance, Labs, Weight trends  REASON FOR ASSESSMENT:   Consult Assessment of nutrition requirement/status  ASSESSMENT:   84 year old female who presented on 5/24 with fever, cough, weakness. PMH of HTN, T2DM, anemia. Pt admitted in February for SBO and was treated at that time for respiratory failure secondary to aspiration pneumonia. Admitted with SIRS.   Palliative Care following. Per Palliative Care note, no feeding tube. SLP recommending regular textures.  Spoke with pt at bedside. Pt in recliner eating from lunch tray at time of visit. Pt reports that the food is good and that she is enjoying it.  Pt states that she has had a poor appetite for about 1 week PTA and "just didn't want to eat anything." Pt reports that her appetite is improving now.  Pt reports that when she feels well, she typically eats 2 meals daily. Pt reports that she does not eat as much as she used to before her SBO and hospital admission in February. Pt reports that she drinks milkshakes from [place] but that she recently stopped drinking those.  Pt endorses weight loss and reports that in 1999, she weighed over 200 lbs. Pt believes her UBW more recently has been between 137-139 lbs. Weight on admission is 152  lbs but appears stated rather than measured. Recommend obtaining updated weight.  If admission weight is accurate, pt has experienced a 12.3 kg weight loss since 02/22/19. This is a 15.1% weight loss in 7 months which is significant for timeframe.  Discussed with pt the importance of adequate PO intake in maintaining lean muscle mass and preventing additional weight loss. Pt expresses understanding and is willing to consume oral nutrition supplements during admission.  Discussed diet liberalization with MD who agreed. Verbal with readback order placed.  Medications reviewed and include: SSI, magnesium oxide, miralax, Klor-con 40 mEq x 2, IV abx, IV KCl 10 mEq x 2  Labs reviewed: potassium 2.6 CBG's: 98-141 x 24 hours  NUTRITION - FOCUSED PHYSICAL EXAM:    Most Recent Value  Orbital Region  Mild depletion  Upper Arm Region  No depletion  Thoracic and Lumbar Region  No depletion  Buccal Region  Mild depletion  Temple Region  Mild depletion  Clavicle Bone Region  Mild depletion  Clavicle and Acromion Bone Region  Mild depletion  Scapular Bone Region  No depletion  Dorsal Hand  Mild depletion  Patellar Region  No depletion  Anterior Thigh Region  Mild depletion  Posterior Calf Region  No depletion  Edema (RD Assessment)  None  Hair  Reviewed  Eyes  Reviewed  Mouth  Reviewed  Skin  Reviewed  Nails  Reviewed       Diet Order:   Diet Order            Diet  regular Room service appropriate? Yes; Fluid consistency: Thin  Diet effective now              EDUCATION NEEDS:   Education needs have been addressed  Skin:  Skin Assessment: Reviewed RN Assessment  Last BM:  09/30/19  Height:   Ht Readings from Last 1 Encounters:  09/30/19 5\' 9"  (1.753 m)    Weight:   Wt Readings from Last 1 Encounters:  09/30/19 68.9 kg    Ideal Body Weight:  65.9 kg  BMI:  Body mass index is 22.45 kg/m.  Estimated Nutritional Needs:   Kcal:  1500-1700  Protein:  75-90  grams  Fluid:  1.5-1.7 L    Gaynell Face, MS, RD, LDN Inpatient Clinical Dietitian Pager: [phone] Weekend/After Hours: [phone]

## 2019-10-01 NOTE — Evaluation (Signed)
Clinical/Bedside Swallow Evaluation Patient Details  Name: LISSET PACKARD MRN: [id number] Date of Birth: 04-May-1936  Today's Date: 10/01/2019 Time: SLP Start Time (ACUTE ONLY): 1122 SLP Stop Time (ACUTE ONLY): 1142 SLP Time Calculation (min) (ACUTE ONLY): 20 min  Past Medical History:  Past Medical History:  Diagnosis Date  . Diabetes mellitus without complication ([place])   . High cholesterol   . Hypertension   . Thyroid disease    Past Surgical History:  Past Surgical History:  Procedure Laterality Date  . ABDOMINAL HYSTERECTOMY    . EYE SURGERY Bilateral 06/2016   Cataract  . KNEE SURGERY Right   . LAPAROTOMY N/A 08/31/2017   Procedure: EXPLORATORY LAPAROTOMY; BILATERAL SALPINGOOPHERECTOMY; OMENTECTOMY, TUMOR DEBULKING;  Surgeon: Everitt Amber, MD;  Location: [facility];  Service: Gynecology;  Laterality: N/A;  . LAPAROTOMY N/A 07/01/2019   Procedure: EXPLORATORY LAPAROTOMY;  Surgeon: Jovita Kussmaul, MD;  Location: [facility];  Service: General;  Laterality: N/A;  . MASS EXCISION N/A 08/31/2017   Procedure: RESECTION OF PELVIC MASS;  Surgeon: Everitt Amber, MD;  Location: [facility];  Service: Gynecology;  Laterality: N/A;  . RIGHT HEART CATH N/A 02/11/2019   Procedure: RIGHT HEART CATH;  Surgeon: Nigel Mormon, MD;  Location: [facility];  Service: Cardiovascular;  Laterality: N/A;  . SMALL INTESTINE SURGERY     HPI:  84 year old female with prior history of hypertension, diabetes, anemia of chronic disease,  admitted March 2021 for small bowel obstruction and aspiration pneumonia, presents 5/24 for fever and chills and nonproductive cough.  Hx MBS 07/18/19: revealed normal pharyngeal swallow with mild deficits in the oral phase; prolonged oral preparation and decreased cohesion due to confusion/MS changes; airway protection was reliable and there was no aspiration.Pt coughed throughout study but it was NOT related to airway invasion.    Assessment / Plan / Recommendation Clinical  Impression  Pt known to this clinician from prior admission.  She participated in clinical swallow assessment today, and her swallow appeared stronger than when last evaluated, due likely to her mental status being much better. Today she presented with normal oral phase (mastication, control), swift swallow response, and no s/s of aspiration despite mixed consistencies and large, successive boluses of thin liquids.  Recommend continuing current carb modified diet, thin liquids. NO SLP f/u is warranted.  SLP Visit Diagnosis: Dysphagia, unspecified (R13.10)    Aspiration Risk  No limitations    Diet Recommendation   carb modified, thin liquids  Medication Administration: Whole meds with liquid    Other  Recommendations Oral Care Recommendations: Oral care BID   Follow up Recommendations None        Swallow Study   General HPI: 84 year old female with prior history of hypertension, diabetes, anemia of chronic disease,  admitted March 2021 for small bowel obstruction and aspiration pneumonia, presents 5/24 for fever and chills and nonproductive cough.  Hx MBS 07/18/19: revealed normal pharyngeal swallow with mild deficits in the oral phase; prolonged oral preparation and decreased cohesion due to confusion/MS changes; airway protection was reliable and there was no aspiration.Pt coughed throughout study but it was NOT related to airway invasion.  Type of Study: Bedside Swallow Evaluation Previous Swallow Assessment: see HPI Diet Prior to this Study: Regular;Thin liquids Temperature Spikes Noted: Yes Respiratory Status: Nasal cannula History of Recent Intubation: No Behavior/Cognition: Alert;Cooperative;Pleasant mood Oral Cavity Assessment: Within Functional Limits Oral Care Completed by SLP: No Oral Cavity - Dentition: Dentures, top;Dentures, bottom Vision: Functional for self-feeding Self-Feeding Abilities: Able  to feed self Patient Positioning: Upright in bed Baseline Vocal Quality:  Normal Volitional Cough: Strong Volitional Swallow: Able to elicit    Oral/Motor/Sensory Function Overall Oral Motor/Sensory Function: Within functional limits   Ice Chips Ice chips: Within functional limits   Thin Liquid Thin Liquid: Within functional limits    Nectar Thick Nectar Thick Liquid: Not tested   Honey Thick Honey Thick Liquid: Not tested   Puree Puree: Within functional limits   Solid     Solid: Within functional limits      Juan Quam Laurice 10/01/2019,11:43 AM  Estill Bamberg L. Tivis Ringer, [place] Office number [phone] Pager [phone]

## 2019-10-01 NOTE — Progress Notes (Signed)
Pharmacy Antibiotic Note  Gloria Murphy is a 84 y.o. female admitted on 09/30/2019 with chills and increased weakness.  Pharmacy has been consulted to narrow antibiotics to Unasyn monotherapy for aspiration.  Renal function stable, now afebrile, WBC WNL, LA 1.7.  Plan: Unasyn 3gm IV Q6H Pharmacy will sign off as renal function is stable.  Thank you for the consult!   Height: 5\' 9"  (175.3 cm) Weight: 68.9 kg (152 lb) IBW/kg (Calculated) : 66.2  Temp (24hrs), Avg:98.5 F (36.9 C), Min:97.8 F (36.6 C), Max:99.3 F (37.4 C)  Recent Labs  Lab 09/30/19 0025 09/30/19 0117 09/30/19 0435  WBC 6.5  --  5.5  CREATININE 0.77  --  0.65  LATICACIDVEN 1.7 1.7  --     Estimated Creatinine Clearance: 57.6 mL/min (by C-G formula based on SCr of 0.65 mg/dL).    No Known Allergies  Vanc 5/24 >> 5/25 Cefepime 5/24 >> 5/25 Flagyl 5/24 >> 5/25 Unasyn 5/25 >>  5/24 covid - negative 5/24 UCx - negative 5/24 BCx -   [name] D. Mina Marble, PharmD, BCPS, [place] 10/01/2019, 11:21 AM

## 2019-10-01 NOTE — Progress Notes (Signed)
Hydrologist [facility])  Hospital Liaison: RN note         Notified by [facility] manager of patient/family request for [facility] Palliative services at home after discharge.         Writer spoke with patient to confirm interest and explain services.               [place] Palliative team will follow up with patient after discharge.         Please call with any hospice or palliative related questions.         Thank you for this referral.         Farrel Gordon, RN, CCM  [place] (listed on [place] under Hospice/[facility])    [phone]

## 2019-10-01 NOTE — Evaluation (Signed)
Physical Therapy Evaluation Patient Details Name: Gloria Murphy MRN: [id number] DOB: March 17, 1936 Today's Date: 10/01/2019   History of Present Illness  84 y.o. female admitted 09/30/19 SIRS as a result of aspiration pnemonia(?). Palliative care consulted. PMH thyroid disease, HTN, high cholesterol, DM.  Clinical Impression  Pt presents with an overall decrease in functional mobility, generalized weakness, decreased dyanmic balance secondary to above. PTA, pt lives in one story home with husband. Today, pt able to complete bed mobility supervision, sit to stand supervision and amb min guard with RW. Pt able to amb to bathroom min guard and complete perineal hygine independent. Discussed option of home health physical therapy vs no follow up services, pt said she would think about it. Pt would benefit from continued acute PT services to maximize functional mobility and independence prior to d/c home. Will follow acutely.      Follow Up Recommendations Home health PT(Offered home health, pt said she would think about it)    Equipment Recommendations  None recommended by PT    Recommendations for Other Services       Precautions / Restrictions Precautions Precautions: Fall Restrictions Weight Bearing Restrictions: No      Mobility  Bed Mobility Overal bed mobility: Needs Assistance Bed Mobility: Supine to Sit     Supine to sit: Supervision     General bed mobility comments: Pt requires supervision for safety and line management  Transfers Overall transfer level: Needs assistance Equipment used: Rolling walker (2 wheeled) Transfers: Sit to/from Stand Sit to Stand: Supervision         General transfer comment: Pt required supervision for sit to stand for safety and stability. Pt able to transfer on/off toliet independently and perform perineal hygine independently.  Ambulation/Gait Ambulation/Gait assistance: Min guard Gait Distance (Feet): 100 Feet Assistive device:  Rolling walker (2 wheeled)   Gait velocity: decreased   General Gait Details: Pt required verbal cues for trunk extension, pt demonstrated learning through self correcting posture twice throughout amb.  Stairs            Wheelchair Mobility    Modified Rankin (Stroke Patients Only)       Balance Overall balance assessment: Needs assistance Sitting-balance support: Bilateral upper extremity supported;Feet supported Sitting balance-Leahy Scale: Fair Sitting balance - Comments: Pt able to sit EOB without LOB     Standing balance-Leahy Scale: Fair Standing balance comment: Pt able to perform perineal hygine with single UE support independently. Pt required RW for amb and uses RW at baseline.                             Pertinent Vitals/Pain Pain Assessment: No/denies pain    Home Living Family/patient expects to be discharged to:: Private residence Living Arrangements: Spouse/significant other Available Help at Discharge: Family;Available 24 hours/day;Available PRN/intermittently Type of Home: House Home Access: Level entry     Home Layout: One level Home Equipment: Bedside commode;Walker - 4 wheels      Prior Function Level of Independence: Independent with assistive device(s)         Comments: Pt husband reports she uses walker at home     Hand Dominance        Extremity/Trunk Assessment   Upper Extremity Assessment Upper Extremity Assessment: Overall WFL for tasks assessed    Lower Extremity Assessment Lower Extremity Assessment: Generalized weakness    Cervical / Trunk Assessment Cervical / Trunk Assessment: Kyphotic  Communication  Communication: No difficulties  Cognition Arousal/Alertness: Awake/alert Behavior During Therapy: WFL for tasks assessed/performed Overall Cognitive Status: Within Functional Limits for tasks assessed                                 General Comments: Pt able to follow commands without  difficulty, pt required increased time for mobility      General Comments General comments (skin integrity, edema, etc.): Not noteable skin integrity changes, pt denied SOB, dyspena etc during amb. Unable to get pulse ox reading.  Pt reported fatigue at the end of amb bout.    Exercises     Assessment/Plan    PT Assessment Patient needs continued PT services  PT Problem List Decreased strength;Decreased mobility;Decreased activity tolerance;Cardiopulmonary status limiting activity;Decreased balance;Decreased knowledge of use of DME       PT Treatment Interventions DME instruction;Therapeutic activities;Gait training;Therapeutic exercise;Patient/family education;Balance training;Functional mobility training    PT Goals (Current goals can be found in the Care Plan section)  Acute Rehab PT Goals Patient Stated Goal: Home PT Goal Formulation: With patient/family Time For Goal Achievement: 10/15/19 Potential to Achieve Goals: Good    Frequency Min 3X/week   Barriers to discharge   No noteable barriers to d/c    Co-evaluation               AM-PAC PT "6 Clicks" Mobility  Outcome Measure Help needed turning from your back to your side while in a flat bed without using bedrails?: None Help needed moving from lying on your back to sitting on the side of a flat bed without using bedrails?: None Help needed moving to and from a bed to a chair (including a wheelchair)?: A Little Help needed standing up from a chair using your arms (e.g., wheelchair or bedside chair)?: None Help needed to walk in hospital room?: A Little Help needed climbing 3-5 steps with a railing? : A Lot 6 Click Score: 20    End of Session Equipment Utilized During Treatment: Gait belt Activity Tolerance: Patient tolerated treatment well Patient left: in chair;with family/visitor present Nurse Communication: Mobility status PT Visit Diagnosis: Unsteadiness on feet (R26.81);Muscle weakness (generalized)  (M62.81)    Time: [id number] PT Time Calculation (min) (ACUTE ONLY): 30 min   Charges:   PT Evaluation $PT Eval Moderate Complexity: 1 Mod PT Treatments $Therapeutic Activity: 8-22 mins        Rolland Porter SPT 10/01/2019   Rolland Porter 10/01/2019, 5:34 PM

## 2019-10-01 NOTE — TOC Initial Note (Signed)
Transition of Care [facility]) - Initial/Assessment Note    Patient Details  Name: Gloria Murphy MRN: [id number] Date of Birth: [birth year]  [facility] CM/SW Contact:    Geralynn Ochs, LCSW Phone Number: 10/01/2019, 3:53 PM  Clinical Narrative:   CSW met with patient and spouse at bedside, discussed Meals on Wheels and home health. CSW explained how Meals on Wheels is set up through the county, and depending on where they live there may be a waitlist. CSW to provide patient and family with contact number to reach out for Meals on Wheels. CSW also asked about choice for home health, and patient has no preference for home health provider at this time. Awaiting PT/OT evals at this time for recommendations. CSW sent referral for outpatient palliative to [facility]. CSW to follow.                Expected Discharge Plan: [place] Barriers to Discharge: Continued Medical Work up, Ship broker   Patient Goals and CMS Choice Patient states their goals for this hospitalization and ongoing recovery are:: get some help at home CMS Medicare.gov Compare Post Acute Care list provided to:: Patient Choice offered to / list presented to : Patient  Expected Discharge Plan and Services Expected Discharge Plan: [place] Acute Care Choice: [place] arrangements for the past 2 months: Single Family Home                                      Prior Living Arrangements/Services Living arrangements for the past 2 months: Single Family Home Lives with:: Spouse Patient language and need for interpreter reviewed:: No Do you feel safe going back to the place where you live?: Yes      Need for Family Participation in Patient Care: No (Comment) Care giver support system in place?: Yes (comment)   Criminal Activity/Legal Involvement Pertinent to Current Situation/Hospitalization: No - Comment as needed  Activities of  Daily Living      Permission Sought/Granted Permission sought to share information with : Family Supports Permission granted to share information with : Yes, Verbal Permission Granted  Share Information with NAME: Diona Fanti     Permission granted to share info w Relationship: Spouse     Emotional Assessment Appearance:: Appears stated age Attitude/Demeanor/Rapport: Engaged Affect (typically observed): Pleasant Orientation: : Oriented to Self, Oriented to Place, Oriented to  Time, Oriented to Situation Alcohol / Substance Use: Not Applicable Psych Involvement: No (comment)  Admission diagnosis:  Tachypnea [R06.82] Cough [R05] Tachycardia [R00.0] RLQ abdominal tenderness [R10.813] SIRS (systemic inflammatory response syndrome) (HCC) [R65.10] Fever in other diseases [R50.81] Patient Active Problem List   Diagnosis Date Noted  . SIRS (systemic inflammatory response syndrome) ([place]) 09/30/2019  . Anemia 09/30/2019  . Palliative care by specialist   . Goals of care, counseling/discussion   . Pressure injury of skin 07/16/2019  . Dysphagia, oropharyngeal 07/13/2019  . FTT (failure to thrive) in adult   . Severe protein-calorie malnutrition ([place])   . Palliative care encounter   . Sinus tachycardia 06/27/2019  . Type 2 diabetes mellitus without complication, without long-term current use of insulin ([place]) 06/26/2019  . SBO (small bowel obstruction) s/p adhesiolysis 07/01/2019 06/26/2019  . Abdominal pain 06/26/2019  . Lactic acidosis 06/26/2019  . Elevated troponin 06/26/2019  . Essential hypertension 02/22/2019  . Exertional  dyspnea 02/04/2019  . Vision disturbance 12/19/2018  . TIA (transient ischemic attack) 12/19/2018  . Malignant carcinoid tumor to left adnexa 10/06/2017  . Elevated CA-125 08/31/2017   PCP:  Gayland Curry, DO Pharmacy:   [facility], [place] [address] Phone:  [phone] Fax: [place], [place] - [address] [address] Phone: [phone] Fax: [phone]     Social Determinants of Health (SDOH) Interventions    Readmission Risk Interventions No flowsheet data found.

## 2019-10-01 NOTE — Progress Notes (Signed)
PROGRESS NOTE    Gloria Murphy  [id number] DOB: 01-23-1936 DOA: 09/30/2019 PCP: Gayland Curry, DO    Chief Complaint  Patient presents with  . Code Sepsis    Brief Narrative:  84 year old lady with prior history of hypertension, diabetes, anemia of chronic disease, recently admitted for small bowel obstruction and aspiration pneumonia presents today for fever and chills and nonproductive cough.  On arrival to ED she was found to be febrile and hypoxic requiring 2 lit of [place] oxygen.  Chest x-ray does not show any new pneumonia or infiltrates, urine analysis negative, abdominal x-rays did not show any SBO.  Given persistent fever and tachycardia patient was empirically started on broad-spectrum IV antibiotics and referred to [facility] for admission. Pt seen and examined at bedside. T max is 99.3, still on 2 lit of [place] oxygen. Reports abd pain has resolved.  As per the daughter at bedside, pt was on dysphagia diet at home and SLP follow up upgraded to regular diet.    Assessment & Plan:   Principal Problem:   SIRS (systemic inflammatory response syndrome) (HCC) Active Problems:   Essential hypertension   Type 2 diabetes mellitus without complication, without long-term current use of insulin (HCC)   Anemia   SIRS possibly from aspiration pneumonia? Febrile, tachycardic and hypoxic on admission.  She was empirically started on broad spectrum IV antibiotics and transitioned to IV unasyn today.  So far cultures have been negative. WBC count wnl.  CXR on admission negative for infiltrates. UA is negative. ABD imaging is unremarkable.  SLP evaluation ordered.     Type 2 DM:  Not on meds at home.  Continue with SSI.   Anemia of chronic disease: Hemoglobin stable around 10.  Anemia panel ordered for further evaluation.    Essential hypertension Well controlled today.  Continue with coreg and amlodipine.   Mild metabolic acidosis:  Gently hydrate and repeat bmp .      DVT  prophylaxis: Lovenox.   Code Status: DNR Family Communication: daughter at bedside, discussed the plan with the family.  Disposition:   Status is: Inpatient  Remains inpatient appropriate because:IV treatments appropriate due to intensity of illness or inability to take PO   Dispo: The patient is from: Home              Anticipated d/c is to: pending              Anticipated d/c date is: 2 days              Patient currently is not medically stable to d/c.        Consultants:  SLP None.    Procedures: none.   Antimicrobials:  Anti-infectives (From admission, onward)   Start     Dose/Rate Route Frequency Ordered Stop   10/01/19 1200  Ampicillin-Sulbactam (UNASYN) 3 g in sodium chloride 0.9 % 100 mL IVPB     3 g 200 mL/hr over 30 Minutes Intravenous Every 6 hours 10/01/19 1121     10/01/19 0200  vancomycin (VANCOCIN) IVPB 1000 mg/200 mL premix  Status:  Discontinued     1,000 mg 200 mL/hr over 60 Minutes Intravenous Every 24 hours 09/30/19 0140 10/01/19 1114   09/30/19 1400  ceFEPIme (MAXIPIME) 2 g in sodium chloride 0.9 % 100 mL IVPB  Status:  Discontinued     2 g 200 mL/hr over 30 Minutes Intravenous Every 12 hours 09/30/19 0140 10/01/19 1114   09/30/19 0800  metroNIDAZOLE (FLAGYL)  IVPB 500 mg  Status:  Discontinued     500 mg 100 mL/hr over 60 Minutes Intravenous Every 8 hours 09/30/19 0436 10/01/19 1114   09/30/19 0100  ceFEPIme (MAXIPIME) 2 g in sodium chloride 0.9 % 100 mL IVPB     2 g 200 mL/hr over 30 Minutes Intravenous  Once 09/30/19 0049 09/30/19 0159   09/30/19 0100  metroNIDAZOLE (FLAGYL) IVPB 500 mg     500 mg 100 mL/hr over 60 Minutes Intravenous  Once 09/30/19 0049 09/30/19 0311   09/30/19 0100  vancomycin (VANCOCIN) IVPB 1000 mg/200 mL premix  Status:  Discontinued     1,000 mg 200 mL/hr over 60 Minutes Intravenous  Once 09/30/19 0049 09/30/19 0051   09/30/19 0100  vancomycin (VANCOREADY) IVPB 1500 mg/300 mL     1,500 mg 150 mL/hr over 120 Minutes  Intravenous  Once 09/30/19 0051 09/30/19 0811       Subjective: No chest pain, abd pain resolved.  Breathing is improving. No cough. Eating breakfast  Objective: Vitals:   09/30/19 2038 09/30/19 2305 10/01/19 0319 10/01/19 0907  BP: (!) 157/92 (!) 164/80 118/62 140/74  Pulse: (!) 120 (!) 120 (!) 103 89  Resp:   (!) 22 18  Temp: 99.1 F (37.3 C)  99.3 F (37.4 C) 98.3 F (36.8 C)  TempSrc: Oral  Oral Oral  SpO2: 100% 100% 100% 100%  Weight:      Height:       No intake or output data in the 24 hours ending 10/01/19 1123 Filed Weights   09/30/19 0049  Weight: 68.9 kg    Examination:  General exam: Appears calm and comfortable on 2 lit of [place] oxygen.  Respiratory system: Clear to auscultation. Respiratory effort normal. Cardiovascular system: S1 & S2 heard, RRR. No JVD,  No pedal edema. Gastrointestinal system: Abdomen is nondistended, soft and nontender.  Normal bowel sounds heard. Central nervous system: Alert and oriented. No focal neurological deficits. Extremities: Symmetric 5 x 5 power. Skin: No rashes, lesions or ulcers Psychiatry:  Mood & affect appropriate.     Data Reviewed: I have personally reviewed following labs and imaging studies  CBC: Recent Labs  Lab 09/30/19 0025 09/30/19 0435  WBC 6.5 5.5  NEUTROABS 5.0 3.8  HGB 10.9* 10.8*  HCT 34.5* 35.6*  MCV 82.1 84.2  PLT 262 [id number]    Basic Metabolic Panel: Recent Labs  Lab 09/30/19 0025 09/30/19 0435  NA 136 138  K 4.1 3.5  CL 106 113*  CO2 20* 16*  GLUCOSE 143* 138*  BUN 11 8  CREATININE 0.77 0.65  CALCIUM 8.8* 8.4*    GFR: Estimated Creatinine Clearance: 57.6 mL/min (by C-G formula based on SCr of 0.65 mg/dL).  Liver Function Tests: Recent Labs  Lab 09/30/19 0025 09/30/19 0435  AST 26 23  ALT 16 13  ALKPHOS 77 66  BILITOT 0.5 0.6  PROT 7.6 7.1  ALBUMIN 2.5* 2.3*    CBG: Recent Labs  Lab 09/30/19 0753 09/30/19 1332 09/30/19 1705 09/30/19 2123 10/01/19 0908  GLUCAP  139* 141* 98 102* 88     Recent Results (from the past 240 hour(s))  Culture, blood (Routine x 2)     Status: None (Preliminary result)   Collection Time: 09/30/19 12:22 AM   Specimen: BLOOD  Result Value Ref Range Status   Specimen Description BLOOD LEFT HAND  Final   Special Requests   Final    BOTTLES DRAWN AEROBIC AND ANAEROBIC Blood Culture results may not  be optimal due to an inadequate volume of blood received in culture bottles   Culture   Final    NO GROWTH < 12 HOURS Performed at [facility] [address] [place], [place] [address]    Report Status PENDING  Incomplete  Culture, blood (Routine x 2)     Status: None (Preliminary result)   Collection Time: 09/30/19 12:22 AM   Specimen: BLOOD  Result Value Ref Range Status   Specimen Description BLOOD LEFT ARM  Final   Special Requests   Final    BOTTLES DRAWN AEROBIC AND ANAEROBIC Blood Culture adequate volume   Culture   Final    NO GROWTH < 12 HOURS Performed at [facility], [place] [address] [place], [place] [address]    Report Status PENDING  Incomplete  Urine culture     Status: None   Collection Time: 09/30/19 12:30 AM   Specimen: In/Out Cath Urine  Result Value Ref Range Status   Specimen Description IN/OUT CATH URINE  Final   Special Requests NONE  Final   Culture   Final    NO GROWTH Performed at [facility], [place] [address], [place], [place] [address]    Report Status 10/01/2019 FINAL  Final  SARS Coronavirus 2 by RT PCR (hospital order, performed in [facility] hospital lab) Nasopharyngeal Nasopharyngeal Swab     Status: None   Collection Time: 09/30/19  2:34 AM   Specimen: Nasopharyngeal Swab  Result Value Ref Range Status   SARS Coronavirus 2 NEGATIVE NEGATIVE Final    Comment: (NOTE) SARS-CoV-2 target nucleic acids are NOT DETECTED. The SARS-CoV-2 RNA is generally detectable in upper and lower respiratory specimens during the acute phase of infection. The lowest concentration  of SARS-CoV-2 viral copies this assay can detect is 250 copies / mL. A negative result does not preclude SARS-CoV-2 infection and should not be used as the sole basis for treatment or other patient management decisions.  A negative result may occur with improper specimen collection / handling, submission of specimen other than nasopharyngeal swab, presence of viral mutation(s) within the areas targeted by this assay, and inadequate number of viral copies (<250 copies / mL). A negative result must be combined with clinical observations, patient history, and epidemiological information. Fact Sheet for Patients:   StrictlyIdeas.no Fact Sheet for Healthcare Providers: BankingDealers.co.za This test is not yet approved or cleared  by the [place] FDA and has been authorized for detection and/or diagnosis of SARS-CoV-2 by FDA under an Emergency Use Authorization (EUA).  This EUA will remain in effect (meaning this test can be used) for the duration of the COVID-19 declaration under Section 564(b)(1) of the Act, 21 U.S.C. section 360bbb-3(b)(1), unless the authorization is terminated or revoked sooner. Performed at [facility], [place] [address] [place], [place] [address]          Radiology Studies: DG Chest 2 View  Result Date: 09/30/2019 CLINICAL DATA:  Suspected sepsis EXAM: CHEST - 2 VIEW COMPARISON:  Radiograph 07/15/2019 FINDINGS: Slight hyperinflation with flattening of the diaphragms. Coarse fine reticular opacities throughout the lungs are similar to comparison exams. No consolidation, features of edema, pneumothorax, or effusion. Pulmonary vascularity remains well-defined. The cardiomediastinal contours are unremarkable. No acute osseous or soft tissue abnormality. Degenerative changes are present in the imaged spine and shoulders. Telemetry leads overlie the chest. IMPRESSION: Chronically coarsened interstitial changes in the  lungs with hyperinflation. No acute cardiopulmonary abnormality. Electronically Signed  By: Lovena Le M.D.   On: 09/30/2019 01:17   DG Abd 2 Views  Result Date: 09/30/2019 CLINICAL DATA:  Right lower quadrant abdominal pain EXAM: ABDOMEN - 2 VIEW COMPARISON:  07/15/2019 FINDINGS: Nonobstructive bowel gas pattern. No organomegaly or free air. No suspicious calcification. Surgical clips in the left pelvis. No acute bony abnormality. IMPRESSION: No acute findings. Electronically Signed   By: Rolm Baptise M.D.   On: 09/30/2019 08:57        Scheduled Meds: . amLODipine  5 mg Oral Daily  . aspirin EC  81 mg Oral Daily  . carvedilol  6.25 mg Oral BID WC  . enoxaparin (LOVENOX) injection  40 mg Subcutaneous Daily  . insulin aspart  0-9 Units Subcutaneous TID WC  . magnesium oxide  400 mg Oral Daily  . polyethylene glycol  17 g Oral Daily   Continuous Infusions: . ampicillin-sulbactam (UNASYN) IV       LOS: 1 day       Hosie Poisson, MD [facility]   To contact the attending provider between 7A-7P or the covering provider during after hours 7P-7A, please log into the web site [email or web address] and access using universal [place] password for that web site. If you do not have the password, please call the hospital operator.  10/01/2019, 11:23 AM

## 2019-10-01 NOTE — Progress Notes (Signed)
Pt Potassium came back 2.6 MD was informed new were given.

## 2019-10-02 ENCOUNTER — Other Ambulatory Visit: Payer: Self-pay

## 2019-10-02 DIAGNOSIS — R7881 Bacteremia: Secondary | ICD-10-CM

## 2019-10-02 DIAGNOSIS — J690 Pneumonitis due to inhalation of food and vomit: Principal | ICD-10-CM

## 2019-10-02 LAB — BASIC METABOLIC PANEL
Anion gap: 9 (ref 5–15)
BUN: 6 mg/dL — ABNORMAL LOW (ref 8–23)
CO2: 20 mmol/L — ABNORMAL LOW (ref 22–32)
Calcium: 9 mg/dL (ref 8.9–10.3)
Chloride: 109 mmol/L (ref 98–111)
Creatinine, Ser: 0.58 mg/dL (ref 0.44–1.00)
GFR calc Af Amer: >60 mL/min (ref >60)
GFR calc non Af Amer: >60 mL/min (ref >60)
Glucose, Bld: 111 mg/dL — ABNORMAL HIGH (ref 70–99)
Potassium: 4 mmol/L (ref 3.5–5.1)
Sodium: 138 mmol/L (ref 135–145)

## 2019-10-02 LAB — CBC WITH DIFFERENTIAL/PLATELET
Abs Immature Granulocytes: 0.06 10*3/uL (ref 0.00–0.07)
Basophils Absolute: 0 10*3/uL (ref 0.0–0.1)
Basophils Relative: 1 %
Eosinophils Absolute: 0.1 10*3/uL (ref 0.0–0.5)
Eosinophils Relative: 3 %
HCT: 31.9 % — ABNORMAL LOW (ref 36.0–46.0)
Hemoglobin: 10.4 g/dL — ABNORMAL LOW (ref 12.0–15.0)
Immature Granulocytes: 1 %
Lymphocytes Relative: 16 %
Lymphs Abs: 0.7 10*3/uL (ref 0.7–4.0)
MCH: 25.9 pg — ABNORMAL LOW (ref 26.0–34.0)
MCHC: 32.6 g/dL (ref 30.0–36.0)
MCV: 79.6 fL — ABNORMAL LOW (ref 80.0–100.0)
Monocytes Absolute: 0.7 10*3/uL (ref 0.1–1.0)
Monocytes Relative: 16 %
Neutro Abs: 2.7 10*3/uL (ref 1.7–7.7)
Neutrophils Relative %: 63 %
Platelets: 268 10*3/uL (ref 150–400)
RBC: 4.01 MIL/uL (ref 3.87–5.11)
RDW: 17.3 % — ABNORMAL HIGH (ref 11.5–15.5)
WBC: 4.3 10*3/uL (ref 4.0–10.5)
nRBC: 0 % (ref 0.0–0.2)

## 2019-10-02 LAB — GLUCOSE, CAPILLARY
Glucose-Capillary: 99 mg/dL (ref 70–99)
Glucose-Capillary: 154 mg/dL — ABNORMAL HIGH (ref 70–99)
Glucose-Capillary: 151 mg/dL — ABNORMAL HIGH (ref 70–99)
Glucose-Capillary: 129 mg/dL — ABNORMAL HIGH (ref 70–99)

## 2019-10-02 MED ORDER — HYPROMELLOSE (GONIOSCOPIC) 2.5 % OP SOLN
2.0000 [drp] | Freq: Four times a day (QID) | OPHTHALMIC | Status: DC
  Administered 2019-10-02 – 2019-10-03 (×5): 2 [drp] via OPHTHALMIC
  Filled 2019-10-02: qty 15

## 2019-10-02 MED ORDER — AMOXICILLIN-POT CLAVULANATE 875-125 MG PO TABS
1.0000 | ORAL_TABLET | Freq: Two times a day (BID) | ORAL | Status: DC
  Administered 2019-10-02 – 2019-10-03 (×2): 1 via ORAL
  Filled 2019-10-02 (×2): qty 1

## 2019-10-02 NOTE — TOC Progression Note (Signed)
Transition of Care [facility]: [facility]onda W) - Progression Note    Patient Details  Name: Gloria Murphy MRN: [id number] Date of Birth: [birth year]  Transition of Care [facility] [place]) CM/SW [place], [place] Phone Number: 10/02/2019, 3:56 PM  Clinical Narrative:     CSW sought to set up homehealth with the following:  [facility] unable [facility] accept [facility] unable to accept [facility] unable to accept [facility] to accept [facility] Unable to accept   Expected Discharge Plan: [place] Barriers to Discharge: Continued Medical Work up, Ship broker  Expected Discharge Plan and Services Expected Discharge Plan: [place] arrangements for the past 2 months: Single Family Home                                       Social Determinants of Health (SDOH) Interventions    Readmission Risk Interventions No flowsheet data found.

## 2019-10-02 NOTE — Evaluation (Signed)
Occupational Therapy Evaluation Patient Details Name: Gloria Murphy MRN: [id number] DOB: [birth year] Today's Date: 10/02/2019    History of Present Illness 84 y.o. female admitted 09/30/19 SIRS as a result of aspiration pnemonia(?). Palliative care consulted. PMH thyroid disease, HTN, high cholesterol, DM.   Clinical Impression   Pt was starting to walk without her walker and was able to sponge bathe, dress, toilet and feed herself. She was dependent in IADL. Pt currently requires set up to min guard assist. She is pleasant and eager to work with therapy as well as return home. Recommending home with [place], daughter is bedside and in agreement.     Follow Up Recommendations  Home health OT;Supervision/Assistance - 24 hour    Equipment Recommendations  None recommended by OT    Recommendations for Other Services       Precautions / Restrictions Precautions Precautions: Fall Restrictions Weight Bearing Restrictions: No      Mobility Bed Mobility Overal bed mobility: Needs Assistance Bed Mobility: Supine to Sit     Supine to sit: Supervision        Transfers Overall transfer level: Needs assistance Equipment used: Rolling walker (2 wheeled) Transfers: Sit to/from [facility] Sit to Stand: Min guard Stand pivot transfers: Min guard       General transfer comment: good hand placement with RW    Balance Overall balance assessment: Needs assistance   Sitting balance-Leahy Scale: Good Sitting balance - Comments: no LOB donning socks     Standing balance-Leahy Scale: Fair Standing balance comment: performed pericare with min guard assist, no UB support                           ADL either performed or assessed with clinical judgement   ADL Overall ADL's : Needs assistance/impaired Eating/Feeding: Set up;Sitting   Grooming: Oral care;Wash/dry hands;Wash/dry face;Sitting;Set up   Upper Body Bathing: Supervision/ safety;Set  up;Sitting   Lower Body Bathing: Min guard;Sit to/from stand   Upper Body Dressing : Set up;Sitting   Lower Body Dressing: Min guard;Sit to/from stand Lower Body Dressing Details (indicate cue type and reason): donned socks bending over Toilet Transfer: Min Insurance claims handler Details (indicate cue type and reason): pivoted to recliner Toileting- Clothing Manipulation and Hygiene: Min guard;Sit to/from stand               Vision Baseline Vision/History: Wears glasses Patient Visual Report: No change from baseline       Perception     Praxis      Pertinent Vitals/Pain Pain Assessment: No/denies pain     Hand Dominance Right   Extremity/Trunk Assessment Upper Extremity Assessment Upper Extremity Assessment: Overall WFL for tasks assessed   Lower Extremity Assessment Lower Extremity Assessment: Defer to PT evaluation   Cervical / Trunk Assessment Cervical / Trunk Assessment: Kyphotic   Communication Communication Communication: No difficulties   Cognition Arousal/Alertness: Awake/alert Behavior During Therapy: WFL for tasks assessed/performed Overall Cognitive Status: History of cognitive impairments - at baseline                                 General Comments: pt with memory deficits and slow processing, likely baseline   General Comments       Exercises     Shoulder Instructions      Home Living Family/patient expects to be discharged to:: Private residence Living Arrangements: Spouse/significant  other Available Help at Discharge: Family;Available 24 hours/day;Available PRN/intermittently Type of Home: House Home Access: Level entry     Home Layout: One level     Bathroom Shower/Tub: Walk-in shower;Door   [employer] Toilet: Handicapped height     Home Equipment: Bedside commode;Walker - 4 wheels          Prior Functioning/Environment Level of Independence: Needs assistance  Gait / Transfers Assistance  Needed: walks with walker, but was starting to no longer need ADL's / Homemaking Assistance Needed: assisted for showering and all IADL            OT Problem List: Impaired balance (sitting and/or standing);Decreased activity tolerance;Decreased cognition      OT Treatment/Interventions:      OT Goals(Current goals can be found in the care plan section) Acute Rehab OT Goals Patient Stated Goal: Home  OT Frequency:     Barriers to D/C:            Co-evaluation              AM-PAC OT "6 Clicks" Daily Activity     Outcome Measure Help from another person eating meals?: A Little Help from another person taking care of personal grooming?: A Little Help from another person toileting, which includes using toliet, bedpan, or urinal?: A Little Help from another person bathing (including washing, rinsing, drying)?: A Little Help from another person to put on and taking off regular upper body clothing?: None Help from another person to put on and taking off regular lower body clothing?: A Little 6 Click Score: 19   End of Session Equipment Utilized During Treatment: Rolling walker Nurse Communication: Other (comment)(need purewick replaced)  Activity Tolerance: Patient tolerated treatment well Patient left: in chair;with call bell/phone within reach;with chair alarm set;with family/visitor present  OT Visit Diagnosis: Other abnormalities of gait and mobility (R26.89);Other symptoms and signs involving cognitive function                Time: [id number] OT Time Calculation (min): 35 min Charges:  OT General Charges $OT Visit: 1 Visit OT Evaluation $OT Eval Moderate Complexity: 1 Mod OT Treatments $Self Care/Home Management : 8-22 mins  Nestor Lewandowsky, OTR/L Acute Rehabilitation Services Pager: [phone] Office: [phone]  Malka So 10/02/2019, 10:03 AM

## 2019-10-02 NOTE — Progress Notes (Signed)
Physical Therapy Treatment Patient Details Name: Gloria Murphy MRN: [id number] DOB: March 28, 1936 Today's Date: 10/02/2019    History of Present Illness 84 y.o. female admitted 09/30/19 SIRS as a result of aspiration pnemonia(?). Palliative care consulted. PMH thyroid disease, HTN, high cholesterol, DM.    PT Comments    Patient received up in recliner, pleasant and motivated to work with therapy. See below for functional mobility levels, generally able to complete all mobility with min guard today with RW and cues for safety. Easily fatigued and did require cues for hand placement/safety with transfers, also cues for upright posture and safe distance from RW with gait today. Required one standing rest break leaning on wall with increased gait distance. Showed increased WOB but no signs of dyspnea, unable to get reading on pulse ox. Left up in recliner with all needs met, chair alarm active and family present.     Follow Up Recommendations  Home health PT     Equipment Recommendations  None recommended by PT    Recommendations for Other Services       Precautions / Restrictions Precautions Precautions: Fall Restrictions Weight Bearing Restrictions: No    Mobility  Bed Mobility Overal bed mobility: Needs Assistance Bed Mobility: Supine to Sit     Supine to sit: Supervision     General bed mobility comments: OOB in recliner  Transfers Overall transfer level: Needs assistance Equipment used: Rolling walker (2 wheeled) Transfers: Sit to/from Stand Sit to Stand: Min guard Stand pivot transfers: Min guard       General transfer comment: min guard for safety, cues for hand placement with functional transfers, poor carryover  Ambulation/Gait Ambulation/Gait assistance: Min guard Gait Distance (Feet): 140 Feet(71fx2) Assistive device: Rolling walker (2 wheeled) Gait Pattern/deviations: Step-through pattern;Decreased step length - right;Decreased step length - left;Trunk  flexed Gait velocity: decreased   General Gait Details: slow but steady with RW, very easily fatigued and required one standing rest break leaning against the wall, increased WOB noted with activity   Stairs             Wheelchair Mobility    Modified Rankin (Stroke Patients Only)       Balance Overall balance assessment: Needs assistance Sitting-balance support: Bilateral upper extremity supported;Feet supported Sitting balance-Leahy Scale: Good Sitting balance - Comments: no LOB donning socks   Standing balance support: Bilateral upper extremity supported Standing balance-Leahy Scale: Fair Standing balance comment: needs  BUE support to maintain dynamic balance                            Cognition Arousal/Alertness: Awake/alert Behavior During Therapy: WFL for tasks assessed/performed Overall Cognitive Status: History of cognitive impairments - at baseline                                 General Comments: pt with memory deficits and slow processing, likely baseline      Exercises      General Comments General comments (skin integrity, edema, etc.): no SOB or dyspnea, but did have increased WOB; unable to get pulse ox reading      Pertinent Vitals/Pain Pain Assessment: No/denies pain    Home Living Family/patient expects to be discharged to:: Private residence Living Arrangements: Spouse/significant other Available Help at Discharge: Family;Available 24 hours/day;Available PRN/intermittently Type of Home: House Home Access: Level entry   Home Layout: One level Home  Equipment: Bedside commode;Walker - 4 wheels      Prior Function Level of Independence: Needs assistance  Gait / Transfers Assistance Needed: walks with walker, but was starting to no longer need ADL's / Homemaking Assistance Needed: assisted for showering and all IADL     PT Goals (current goals can now be found in the care plan section) Acute Rehab PT  Goals Patient Stated Goal: Home PT Goal Formulation: With patient/family Time For Goal Achievement: 10/15/19 Potential to Achieve Goals: Good Progress towards PT goals: Progressing toward goals    Frequency    Min 3X/week      PT Plan Current plan remains appropriate    Co-evaluation              AM-PAC PT "6 Clicks" Mobility   Outcome Measure  Help needed turning from your back to your side while in a flat bed without using bedrails?: None Help needed moving from lying on your back to sitting on the side of a flat bed without using bedrails?: None Help needed moving to and from a bed to a chair (including a wheelchair)?: A Little Help needed standing up from a chair using your arms (e.g., wheelchair or bedside chair)?: None Help needed to walk in hospital room?: A Little Help needed climbing 3-5 steps with a railing? : A Little 6 Click Score: 21    End of Session   Activity Tolerance: Patient tolerated treatment well Patient left: in chair;with family/visitor present;with call bell/phone within reach;with chair alarm set   PT Visit Diagnosis: Unsteadiness on feet (R26.81);Muscle weakness (generalized) (M62.81)     Time: 1020-1040 PT Time Calculation (min) (ACUTE ONLY): 20 min  Charges:  $Gait Training: 8-22 mins                      U PT, DPT, PN1   Supplemental Physical Therapist [place]    Pager [phone] Acute Rehab Office [phone]    

## 2019-10-02 NOTE — Progress Notes (Addendum)
PROGRESS NOTE    Gloria Murphy  [id number] DOB: 28-Jan-1936 DOA: 09/30/2019 PCP: Gayland Curry, DO    Chief Complaint  Patient presents with  . Code Sepsis    Brief Narrative:  84 year old lady with prior history of hypertension, diabetes, anemia of chronic disease, recently admitted for small bowel obstruction and aspiration pneumonia presented with fever and chills and nonproductive cough.  On arrival to ED she was found to be febrile and hypoxic requiring 2 lit of [place] oxygen.  Chest x-ray did not show any new pneumonia or infiltrates, urine analysis negative, abdominal x-rays did not show any SBO.  Given persistent fever and tachycardia patient was empirically started on broad-spectrum IV antibiotics and referred to [facility] for admission.  Subjective: Patient states that she would like to go home.  Her daughter is at the bedside.  She states that she is feeling better.  Cough is improving.  Shortness of breath is almost resolved.  Assessment & Plan:   SIRS possibly from aspiration pneumonia Febrile, tachycardic and hypoxic on admission.  She was empirically started on broad spectrum IV antibiotics and transitioned to IV unasyn.  Her improvement we will transition her to Augmentin.  Abdominal imaging was unremarkable.  UA was unremarkable.  Seen by speech therapy and cleared for regular diet.  Bacteremia, likely contaminant 1 of 4 blood culture growing gram-positive cocci.  BCID did not show any organism. This is likely a contaminant.  Both the patient and her daughter were notified of this finding.  Wait for final identification.  Type 2 DM Not on meds at home.  Continue SSI.  HbA1c was 6.3 back in February.  Anemia of chronic disease: Hemoglobin stable around 10.  Anemia panel does not suggest iron deficiency.  B12 folate levels normal.  Essential hypertension Blood pressure is reasonably well controlled.  Continue to monitor.  Noted to be on carvedilol and amlodipine.     Normal anion gap metabolic acidosis Bicarbonate level stable.     DVT prophylaxis: Lovenox.  Code Status: DNR Family Communication: Discussed with patient and her daughter who was at the bedside. Disposition:   Status is: Inpatient  Remains inpatient appropriate because:IV treatments appropriate due to intensity of illness or inability to take PO   Dispo: The patient is from: Home              Anticipated d/c is to: Home with home health              Anticipated d/c date is: May 27              Patient currently is not medically stable to d/c.  Await final blood cultures.  Transition to oral antibiotics.        Consultants:  SLP None.    Procedures: none.   Antimicrobials:  Anti-infectives (From admission, onward)   Start     Dose/Rate Route Frequency Ordered Stop   10/01/19 1200  Ampicillin-Sulbactam (UNASYN) 3 g in sodium chloride 0.9 % 100 mL IVPB     3 g 200 mL/hr over 30 Minutes Intravenous Every 6 hours 10/01/19 1121     10/01/19 0200  vancomycin (VANCOCIN) IVPB 1000 mg/200 mL premix  Status:  Discontinued     1,000 mg 200 mL/hr over 60 Minutes Intravenous Every 24 hours 09/30/19 0140 10/01/19 1114   09/30/19 1400  ceFEPIme (MAXIPIME) 2 g in sodium chloride 0.9 % 100 mL IVPB  Status:  Discontinued     2 g 200  mL/hr over 30 Minutes Intravenous Every 12 hours 09/30/19 0140 10/01/19 1114   09/30/19 0800  metroNIDAZOLE (FLAGYL) IVPB 500 mg  Status:  Discontinued     500 mg 100 mL/hr over 60 Minutes Intravenous Every 8 hours 09/30/19 0436 10/01/19 1114   09/30/19 0100  ceFEPIme (MAXIPIME) 2 g in sodium chloride 0.9 % 100 mL IVPB     2 g 200 mL/hr over 30 Minutes Intravenous  Once 09/30/19 0049 09/30/19 0159   09/30/19 0100  metroNIDAZOLE (FLAGYL) IVPB 500 mg     500 mg 100 mL/hr over 60 Minutes Intravenous  Once 09/30/19 0049 09/30/19 0311   09/30/19 0100  vancomycin (VANCOCIN) IVPB 1000 mg/200 mL premix  Status:  Discontinued     1,000 mg 200 mL/hr over 60  Minutes Intravenous  Once 09/30/19 0049 09/30/19 0051   09/30/19 0100  vancomycin (VANCOREADY) IVPB 1500 mg/300 mL     1,500 mg 150 mL/hr over 120 Minutes Intravenous  Once 09/30/19 0051 09/30/19 0811         Objective: Vitals:   10/01/19 1523 10/01/19 2234 10/02/19 0237 10/02/19 0749  BP: 126/63 (!) 135/55  138/74  Pulse: 83 87  98  Resp: 18   20  Temp: 97.6 F (36.4 C) 98.4 F (36.9 C)  97.8 F (36.6 C)  TempSrc:      SpO2: 100% 100%  100%  Weight:   67.4 kg   Height:        Intake/Output Summary (Last 24 hours) at 10/02/2019 1140 Last data filed at 10/02/2019 0159 Gross per 24 hour  Intake 200 ml  Output 500 ml  Net -300 ml   Filed Weights   09/30/19 0049 10/02/19 0237  Weight: 68.9 kg 67.4 kg    Examination:  General appearance: Awake alert.  In no distress Resp: Normal effort.  Few crackles bilateral bases.  No wheezing or rhonchi. Cardio: S1-S2 is normal regular.  No S3-S4.  No rubs murmurs or bruit GI: Abdomen is soft.  Nontender nondistended.  Bowel sounds are present normal.  No masses organomegaly Extremities: No edema.  Full range of motion of lower extremities. Neurologic: Alert and oriented x3.  No focal neurological deficits.     Data Reviewed: I have personally reviewed following labs and imaging studies  CBC: Recent Labs  Lab 09/30/19 0025 09/30/19 0435 10/02/19 0212  WBC 6.5 5.5 4.3  NEUTROABS 5.0 3.8 2.7  HGB 10.9* 10.8* 10.4*  HCT 34.5* 35.6* 31.9*  MCV 82.1 84.2 79.6*  PLT 262 226 [id number]    Basic Metabolic Panel: Recent Labs  Lab 09/30/19 0025 09/30/19 0435 10/01/19 1130 10/01/19 1245 10/01/19 1953 10/02/19 0212  NA 136 138 137  --  138 138  K 4.1 3.5 2.6*  --  3.6 4.0  CL 106 113* 109  --  109 109  CO2 20* 16* 20*  --  19* 20*  GLUCOSE 143* 138* 125*  --  172* 111*  BUN 11 8 5*  --  8 6*  CREATININE 0.77 0.65 0.63  --  0.59 0.58  CALCIUM 8.8* 8.4* 8.5*  --  8.8* 9.0  MG  --   --   --  1.6*  --   --      GFR: Estimated Creatinine Clearance: 57.6 mL/min (by C-G formula based on SCr of 0.58 mg/dL).  Liver Function Tests: Recent Labs  Lab 09/30/19 0025 09/30/19 0435  AST 26 23  ALT 16 13  ALKPHOS 77 66  BILITOT  0.5 0.6  PROT 7.6 7.1  ALBUMIN 2.5* 2.3*    CBG: Recent Labs  Lab 10/01/19 0908 10/01/19 1151 10/01/19 1525 10/01/19 2109 10/02/19 0757  GLUCAP 88 114* 132* 208* 99     Recent Results (from the past 240 hour(s))  Culture, blood (Routine x 2)     Status: None (Preliminary result)   Collection Time: 09/30/19 12:22 AM   Specimen: BLOOD  Result Value Ref Range Status   Specimen Description BLOOD LEFT HAND  Final   Special Requests   Final    BOTTLES DRAWN AEROBIC AND ANAEROBIC Blood Culture results may not be optimal due to an inadequate volume of blood received in culture bottles   Culture   Final    NO GROWTH 1 DAY Performed at [facility], [place] [address], [place], [place] [address]    Report Status PENDING  Incomplete  Culture, blood (Routine x 2)     Status: None (Preliminary result)   Collection Time: 09/30/19 12:22 AM   Specimen: BLOOD  Result Value Ref Range Status   Specimen Description BLOOD LEFT ARM  Final   Special Requests   Final    BOTTLES DRAWN AEROBIC AND ANAEROBIC Blood Culture adequate volume   Culture  Setup Time   Final    GRAM POSITIVE COCCI IN CLUSTERS AEROBIC BOTTLE ONLY CRITICAL RESULT CALLED TO, READ BACK BY AND VERIFIED WITH: Eustace Pen 1545 [id number] FCP Performed at [facility], [place] [address] [place], [place] [address]    Culture GRAM POSITIVE COCCI  Final   Report Status PENDING  Incomplete  Blood Culture ID Panel (Reflexed)     Status: None   Collection Time: 09/30/19 12:22 AM  Result Value Ref Range Status   Enterococcus species NOT DETECTED NOT DETECTED Final    Comment: CRITICAL RESULT CALLED TO, READ BACK BY AND VERIFIED WITH: PHARMD F WILSON 1545 [id number] FCP    Listeria monocytogenes NOT DETECTED  NOT DETECTED Final   Staphylococcus species NOT DETECTED NOT DETECTED Final   Staphylococcus aureus (BCID) NOT DETECTED NOT DETECTED Final   Streptococcus species NOT DETECTED NOT DETECTED Final   Streptococcus agalactiae NOT DETECTED NOT DETECTED Final   Streptococcus pneumoniae NOT DETECTED NOT DETECTED Final   Streptococcus pyogenes NOT DETECTED NOT DETECTED Final   Acinetobacter baumannii NOT DETECTED NOT DETECTED Final   Enterobacteriaceae species NOT DETECTED NOT DETECTED Final   Enterobacter cloacae complex NOT DETECTED NOT DETECTED Final   Escherichia coli NOT DETECTED NOT DETECTED Final   Klebsiella oxytoca NOT DETECTED NOT DETECTED Final   Klebsiella pneumoniae NOT DETECTED NOT DETECTED Final   Proteus species NOT DETECTED NOT DETECTED Final   Serratia marcescens NOT DETECTED NOT DETECTED Final   Haemophilus influenzae NOT DETECTED NOT DETECTED Final   Neisseria meningitidis NOT DETECTED NOT DETECTED Final   Pseudomonas aeruginosa NOT DETECTED NOT DETECTED Final   Candida albicans NOT DETECTED NOT DETECTED Final   Candida glabrata NOT DETECTED NOT DETECTED Final   Candida krusei NOT DETECTED NOT DETECTED Final   Candida parapsilosis NOT DETECTED NOT DETECTED Final   Candida tropicalis NOT DETECTED NOT DETECTED Final    Comment: Performed at [facility], [place]. [address], [place], [place] [address]  Urine culture     Status: None   Collection Time: 09/30/19 12:30 AM   Specimen: In/Out Cath Urine  Result Value Ref Range Status   Specimen Description IN/OUT CATH URINE  Final   Special Requests  NONE  Final   Culture   Final    NO GROWTH Performed at [facility], [place] [address] [place], [place] [address]    Report Status 10/01/2019 FINAL  Final  SARS Coronavirus 2 by RT PCR (hospital order, performed in [facility] hospital lab) Nasopharyngeal Nasopharyngeal Swab     Status: None   Collection Time: 09/30/19  2:34 AM   Specimen: Nasopharyngeal Swab  Result Value  Ref Range Status   SARS Coronavirus 2 NEGATIVE NEGATIVE Final    Comment: (NOTE) SARS-CoV-2 target nucleic acids are NOT DETECTED. The SARS-CoV-2 RNA is generally detectable in upper and lower respiratory specimens during the acute phase of infection. The lowest concentration of SARS-CoV-2 viral copies this assay can detect is 250 copies / mL. A negative result does not preclude SARS-CoV-2 infection and should not be used as the sole basis for treatment or other patient management decisions.  A negative result may occur with improper specimen collection / handling, submission of specimen other than nasopharyngeal swab, presence of viral mutation(s) within the areas targeted by this assay, and inadequate number of viral copies (<250 copies / mL). A negative result must be combined with clinical observations, patient history, and epidemiological information. Fact Sheet for Patients:   StrictlyIdeas.no Fact Sheet for Healthcare Providers: BankingDealers.co.za This test is not yet approved or cleared  by the [place] FDA and has been authorized for detection and/or diagnosis of SARS-CoV-2 by FDA under an Emergency Use Authorization (EUA).  This EUA will remain in effect (meaning this test can be used) for the duration of the COVID-19 declaration under Section 564(b)(1) of the Act, 21 U.S.C. section 360bbb-3(b)(1), unless the authorization is terminated or revoked sooner. Performed at [facility], [place] [address] [place], [place] [address]          Radiology Studies: No results found.      Scheduled Meds: . amLODipine  5 mg Oral Daily  . aspirin EC  81 mg Oral Daily  . carvedilol  6.25 mg Oral BID WC  . enoxaparin (LOVENOX) injection  40 mg Subcutaneous Daily  . feeding supplement (ENSURE ENLIVE)  237 mL Oral BID BM  . hydroxypropyl methylcellulose / hypromellose  2 drop Both Eyes QID  . magnesium oxide  400 mg Oral  Daily  . polyethylene glycol  17 g Oral Daily   Continuous Infusions: . ampicillin-sulbactam (UNASYN) IV 3 g (10/02/19 0845)     LOS: 2 days       Bonnielee Haff, MD [facility]   To contact the attending provider between 7A-7P or the covering provider during after hours 7P-7A, please log into the web site [email or web address] and access using universal [place] password for that web site. If you do not have the password, please call the hospital operator.  10/02/2019, 11:40 AM

## 2019-10-03 ENCOUNTER — Encounter: Payer: Self-pay | Admitting: Family

## 2019-10-03 ENCOUNTER — Telehealth: Payer: Self-pay | Admitting: *Deleted

## 2019-10-03 ENCOUNTER — Ambulatory Visit: Payer: Medicare Other | Admitting: Internal Medicine

## 2019-10-03 ENCOUNTER — Ambulatory Visit (INDEPENDENT_AMBULATORY_CARE_PROVIDER_SITE_OTHER): Payer: Medicare Other | Admitting: Family

## 2019-10-03 VITALS — BP 126/82 | HR 99 | Temp 97.5°F | Resp 16 | Ht 69.0 in | Wt 150.4 lb

## 2019-10-03 DIAGNOSIS — D638 Anemia in other chronic diseases classified elsewhere: Secondary | ICD-10-CM

## 2019-10-03 DIAGNOSIS — R2681 Unsteadiness on feet: Secondary | ICD-10-CM

## 2019-10-03 DIAGNOSIS — I10 Essential (primary) hypertension: Secondary | ICD-10-CM | POA: Diagnosis not present

## 2019-10-03 DIAGNOSIS — R0609 Other forms of dyspnea: Secondary | ICD-10-CM

## 2019-10-03 DIAGNOSIS — R413 Other amnesia: Secondary | ICD-10-CM

## 2019-10-03 DIAGNOSIS — R0600 Dyspnea, unspecified: Secondary | ICD-10-CM | POA: Diagnosis not present

## 2019-10-03 DIAGNOSIS — R531 Weakness: Secondary | ICD-10-CM

## 2019-10-03 DIAGNOSIS — E119 Type 2 diabetes mellitus without complications: Secondary | ICD-10-CM | POA: Diagnosis not present

## 2019-10-03 DIAGNOSIS — J690 Pneumonitis due to inhalation of food and vomit: Secondary | ICD-10-CM

## 2019-10-03 LAB — COMPLETE METABOLIC PANEL WITH GFR
AG Ratio: 0.8 (calc) — ABNORMAL LOW (ref 1.0–2.5)
ALT: 15 U/L (ref 6–29)
AST: 34 U/L (ref 10–35)
Albumin: 3.1 g/dL — ABNORMAL LOW (ref 3.6–5.1)
Alkaline phosphatase (APISO): 76 U/L (ref 37–153)
BUN: 13 mg/dL (ref 7–25)
CO2: 26 mmol/L (ref 20–32)
Calcium: 9.3 mg/dL (ref 8.6–10.4)
Chloride: 103 mmol/L (ref 98–110)
Creat: 0.69 mg/dL (ref 0.60–0.88)
GFR, Est African American: 95 mL/min/{1.73_m2} (ref >=60)
GFR, Est Non African American: 82 mL/min/{1.73_m2} (ref >=60)
Globulin: 4 g/dL (calc) — ABNORMAL HIGH (ref 1.9–3.7)
Glucose, Bld: 118 mg/dL (ref 65–139)
Potassium: 3.9 mmol/L (ref 3.5–5.3)
Sodium: 137 mmol/L (ref 135–146)
Total Bilirubin: 0.3 mg/dL (ref 0.2–1.2)
Total Protein: 7.1 g/dL (ref 6.1–8.1)

## 2019-10-03 LAB — CBC WITH DIFFERENTIAL/PLATELET
Absolute Monocytes: 636 cells/uL (ref 200–950)
Basophils Absolute: 58 cells/uL (ref 0–200)
Basophils Relative: 1.1 %
Eosinophils Absolute: 148 cells/uL (ref 15–500)
Eosinophils Relative: 2.8 %
HCT: 34 % — ABNORMAL LOW (ref 35.0–45.0)
Hemoglobin: 11.1 g/dL — ABNORMAL LOW (ref 11.7–15.5)
Lymphs Abs: 859 cells/uL (ref 850–3900)
MCH: 25.2 pg — ABNORMAL LOW (ref 27.0–33.0)
MCHC: 32.6 g/dL (ref 32.0–36.0)
MCV: 77.3 fL — ABNORMAL LOW (ref 80.0–100.0)
MPV: 9.5 fL (ref 7.5–12.5)
Monocytes Relative: 12 %
Neutro Abs: 3599 cells/uL (ref 1500–7800)
Neutrophils Relative %: 67.9 %
Platelets: 368 10*3/uL (ref 140–400)
RBC: 4.4 10*6/uL (ref 3.80–5.10)
RDW: 17.1 % — ABNORMAL HIGH (ref 11.0–15.0)
Total Lymphocyte: 16.2 %
WBC: 5.3 10*3/uL (ref 3.8–10.8)

## 2019-10-03 LAB — GLUCOSE, CAPILLARY
Glucose-Capillary: 103 mg/dL — ABNORMAL HIGH (ref 70–99)
Glucose-Capillary: 179 mg/dL — ABNORMAL HIGH (ref 70–99)

## 2019-10-03 LAB — CULTURE, BLOOD (ROUTINE X 2): Special Requests: ADEQUATE

## 2019-10-03 MED ORDER — GUAIFENESIN 100 MG/5ML PO SOLN: 5.0000 mL | ORAL | 0 refills | Status: DC | PRN

## 2019-10-03 MED ORDER — AMOXICILLIN-POT CLAVULANATE 875-125 MG PO TABS: 1.0000 | ORAL_TABLET | Freq: Two times a day (BID) | ORAL | 0 refills | Status: AC

## 2019-10-03 MED FILL — AMOX-CLAV 875-125 MG TABLET: 875-125 | 4 days supply | Qty: 8 | Fill #0

## 2019-10-03 NOTE — Plan of Care (Signed)
Pt is adequate for discharge. 

## 2019-10-03 NOTE — Progress Notes (Signed)
Provider: Dinah Ngetich FNP-C   Gayland Curry, DO  Patient Care Team: Gayland Curry, DO as PCP - General (Geriatric Medicine) Marcial Pacas, MD as Consulting Physician (Neurology) Nigel Mormon, MD as Consulting Physician (Cardiology) Everitt Amber, MD as Consulting Physician (Gynecologic Oncology) Juanita Craver, MD as Consulting Physician (Gastroenterology)  Extended Emergency Contact Information Primary Emergency Contact: [facility] Phone: [phone] Mobile Phone: [phone] Relation: Spouse Secondary Emergency Contact: Pratt,Sandra Address: [address]          [place], [place] [address] Johnnette Litter of [place] Phone: [phone] Mobile Phone: [phone] Relation: Daughter  Code Status: DNR Goals of care: Advanced Directive information Advanced Directives 10/03/2019  Does Patient Have a Medical Advance Directive? Yes  Type of Paramedic of [place];Living will  Does patient want to make changes to medical advance directive? No - Patient declined  Copy of [place] in Chart? Yes - validated most recent copy scanned in chart (See row information)  Would patient like information on creating a medical advance directive? -     Chief Complaint  Patient presents with  . Transitions Of Care    Hosptial Follow Up    HPI:  Pt is a 84 y.o. female seen today for Transition of care post hospital admission from 09/30/2019- 10/03/2019 for small bowel obstruction and aspiration pneumonia.she presented to the ED with fever,chills and non-productive cough.she was Hypoxic requiring oxygen supplementation 2 L/.CXR done showed pneumonia/infiltrates.Urine analysis was negative.Abdominal X-ray indicated small bowel obstruction.due to persist fever and Tachycardia One out of four blood cultures grew coag negative staph thought possible contaminant.she was treated with broad spectrum IV antibiotics and admitted.she was transitioned  to I.V Unasyn with much improvement.Oxygen was wean off and transitioned to Augmentin.Speech therapy was consulted.Her conditioned improved and was discharged home. She denies any acute issues still has some dyspnea with exertion. Medication reconciled.   Type 2 DM - glucose 179 during Hospital admission.off medication.  Anemia of chronic disease - Latest Hgb 10.4 previous 10.8 vit B12 normal.   Hypertension - on amlodipine 5 mg tablet daily,Carvedilol 6.25 mg tablet twice daily and Furosemide 20 mg tablet daily.B/p within normal.  Memory loss - Daughter states was here to talk to Dr.Reed to evaluate patient's memory loss.discussed screening using MMSE but declined states would like to have MD evaluate patient's memory.   Past Medical History:  Diagnosis Date  . Diabetes mellitus without complication ([place])   . High cholesterol   . Hypertension   . Thyroid disease    Past Surgical History:  Procedure Laterality Date  . ABDOMINAL HYSTERECTOMY    . EYE SURGERY Bilateral 06/2016   Cataract  . KNEE SURGERY Right   . LAPAROTOMY N/A 08/31/2017   Procedure: EXPLORATORY LAPAROTOMY; BILATERAL SALPINGOOPHERECTOMY; OMENTECTOMY, TUMOR DEBULKING;  Surgeon: Everitt Amber, MD;  Location: [facility];  Service: Gynecology;  Laterality: N/A;  . LAPAROTOMY N/A 07/01/2019   Procedure: EXPLORATORY LAPAROTOMY;  Surgeon: Jovita Kussmaul, MD;  Location: [facility];  Service: General;  Laterality: N/A;  . MASS EXCISION N/A 08/31/2017   Procedure: RESECTION OF PELVIC MASS;  Surgeon: Everitt Amber, MD;  Location: [facility];  Service: Gynecology;  Laterality: N/A;  . RIGHT HEART CATH N/A 02/11/2019   Procedure: RIGHT HEART CATH;  Surgeon: Nigel Mormon, MD;  Location: [facility];  Service: Cardiovascular;  Laterality: N/A;  . SMALL INTESTINE SURGERY      No Known Allergies  Allergies as of 10/03/2019  No Known Allergies     Medication List       Accurate as of Oct 03, 2019  3:00 PM. If you have any  questions, ask your nurse or doctor.        amLODipine 5 MG tablet Commonly known as: NORVASC Take 5 mg by mouth daily.   amoxicillin-clavulanate 875-125 MG tablet Commonly known as: Augmentin Take 1 tablet by mouth 2 (two) times daily for 4 days. for aspiration pneumonia   aspirin EC 81 MG tablet Take 1 tablet (81 mg total) by mouth daily.   CALTRATE 600+D3 PO Take 1 tablet by mouth daily.   carvedilol 6.25 MG tablet Commonly known as: COREG TAKE 1 TABLET(6.25 MG) BY MOUTH TWICE DAILY WITH A MEAL   chlorhexidine 0.12 % solution Commonly known as: PERIDEX Use as directed 15 mLs in the mouth or throat daily. What changed: Another medication with the same name was removed. Continue taking this medication, and follow the directions you see here.   famotidine 20 MG tablet Commonly known as: PEPCID Take 20 mg by mouth as needed.   furosemide 20 MG tablet Commonly known as: LASIX Take 20 mg by mouth daily. What changed: Another medication with the same name was removed. Continue taking this medication, and follow the directions you see here.   MAGNESIUM OXIDE PO Take 1 tablet by mouth daily.   polyethylene glycol 17 g packet Commonly known as: MIRALAX / GLYCOLAX Take 17 g by mouth as needed. What changed: Another medication with the same name was removed. Continue taking this medication, and follow the directions you see here.   potassium chloride SA 20 MEQ tablet Commonly known as: KLOR-CON Take 1 tablet (20 mEq total) by mouth daily.   Refresh Tears 0.5 % Soln Generic drug: carboxymethylcellulose Place 1 drop into both eyes 3 (three) times daily.       Review of Systems  Constitutional: Negative for chills, fatigue and fever.  HENT: Negative for congestion, postnasal drip, rhinorrhea, sinus pressure, sinus pain, sneezing and sore throat.   Respiratory: Negative for cough, chest tightness, shortness of breath and wheezing.   Cardiovascular: Negative for chest  pain, palpitations and leg swelling.  Gastrointestinal: Negative for abdominal distention, abdominal pain, constipation, diarrhea, nausea and vomiting.  Endocrine: Negative for cold intolerance, heat intolerance, polydipsia, polyphagia and polyuria.  Genitourinary: Negative for difficulty urinating, dysuria, flank pain, frequency and urgency.  Musculoskeletal: Positive for gait problem.  Skin: Negative for color change, pallor and rash.  Neurological: Negative for dizziness, speech difficulty, weakness, light-headedness, numbness and headaches.  Psychiatric/Behavioral: Negative for agitation, confusion and sleep disturbance. The patient is not nervous/anxious.     Immunization History  Administered Date(s) Administered  . Influenza, High Dose Seasonal PF 03/07/2018, 02/02/2019  . PFIZER SARS-COV-2 Vaccination 06/17/2019, 08/13/2019   Pertinent  Health Maintenance Due  Topic Date Due  . OPHTHALMOLOGY EXAM  Never done  . URINE MICROALBUMIN  Never done  . DEXA SCAN  Never done  . PNA vac Low Risk Adult (1 of 2 - PCV13) Never done  . INFLUENZA VACCINE  12/08/2019  . HEMOGLOBIN A1C  12/25/2019  . FOOT EXAM  08/29/2020   Fall Risk  10/03/2019 09/26/2019 09/20/2019 08/30/2019 12/22/2016  Falls in the past year? 0 0 1 0 No  Number falls in past yr: 0 0 0 0 -  Injury with Fall? 0 0 0 0 -      Vitals:   10/03/19 1440  BP: 126/82  Pulse:  99  Resp: 16  Temp: (!) 97.5 F (36.4 C)  SpO2: 96%  Weight: 150 lb 6.4 oz (68.2 kg)  Height: '5\' 9"'$  (1.753 m)   Body mass index is 22.21 kg/m. Physical Exam Vitals reviewed.  Constitutional:      General: She is not in acute distress.    Appearance: She is normal weight. She is not ill-appearing.  HENT:     Head: Normocephalic.     Right Ear: Tympanic membrane, ear canal and external ear normal. There is no impacted cerumen.     Left Ear: Tympanic membrane, ear canal and external ear normal. There is no impacted cerumen.     Nose: Nose normal.  No congestion or rhinorrhea.     Mouth/Throat:     Mouth: Mucous membranes are moist.     Pharynx: Oropharynx is clear. No oropharyngeal exudate or posterior oropharyngeal erythema.  Eyes:     General: No scleral icterus.       Right eye: No discharge.        Left eye: No discharge.     Extraocular Movements: Extraocular movements intact.     Conjunctiva/sclera: Conjunctivae normal.     Pupils: Pupils are equal, round, and reactive to light.  Neck:     Vascular: No carotid bruit.  Cardiovascular:     Rate and Rhythm: Normal rate and regular rhythm.     Pulses: Normal pulses.     Heart sounds: Normal heart sounds. No murmur. No friction rub. No gallop.   Pulmonary:     Effort: Pulmonary effort is normal. No respiratory distress.     Breath sounds: Normal breath sounds. No wheezing, rhonchi or rales.  Chest:     Chest wall: No tenderness.  Abdominal:     General: Bowel sounds are normal. There is no distension.     Palpations: Abdomen is soft. There is no mass.     Tenderness: There is no abdominal tenderness. There is no right CVA tenderness, left CVA tenderness, guarding or rebound.  Musculoskeletal:     Cervical back: Normal range of motion. No rigidity or tenderness.  Lymphadenopathy:     Cervical: No cervical adenopathy.  Neurological:     Mental Status: She is alert.     Labs reviewed: Recent Labs    07/20/19 0443 07/20/19 0443 07/21/19 2333 07/21/19 2333 07/22/19 0828 09/30/19 0025 10/01/19 1130 10/01/19 1245 10/01/19 1953 10/02/19 0212  NA 134*   < > 135   < > 135   < > 137  --  138 138  K 3.7   < > 3.5   < > 3.6   < > 2.6*  --  3.6 4.0  CL 103   < > 105   < > 106   < > 109  --  109 109  CO2 22   < > 22   < > 21*   < > 20*  --  19* 20*  GLUCOSE 151*   < > 103*   < > 115*   < > 125*  --  172* 111*  BUN 25*   < > 23   < > 21   < > 5*  --  8 6*  CREATININE 0.69   < > 0.58   < > 0.62   < > 0.63  --  0.59 0.58  CALCIUM 8.7*   < > 8.5*   < > 8.6*   < > 8.5*  --   8.8*  9.0  MG 2.0   < > 1.9  --  1.6*  --   --  1.6*  --   --   PHOS 3.1  --  2.8  --  2.8  --   --   --   --   --    < > = values in this interval not displayed.   Recent Labs    07/22/19 0828 09/30/19 0025 09/30/19 0435  AST 51* 26 23  ALT 63* 16 13  ALKPHOS 322* 77 66  BILITOT 0.5 0.5 0.6  PROT 7.1 7.6 7.1  ALBUMIN 2.0* 2.5* 2.3*   Recent Labs    09/30/19 0025 09/30/19 0435 10/02/19 0212  WBC 6.5 5.5 4.3  NEUTROABS 5.0 3.8 2.7  HGB 10.9* 10.8* 10.4*  HCT 34.5* 35.6* 31.9*  MCV 82.1 84.2 79.6*  PLT 262 226 268   Lab Results  Component Value Date   TSH 0.032 (L) 10/01/2019   Lab Results  Component Value Date   HGBA1C 6.3 (H) 06/27/2019   Lab Results  Component Value Date   CHOL  10/31/2007    112        ATP III CLASSIFICATION:  <200     mg/dL   Desirable  200-239  mg/dL   Borderline High  >=240    mg/dL   High   HDL 42 10/31/2007   LDLCALC  10/31/2007    60        Total Cholesterol/HDL:CHD Risk Coronary Heart Disease Risk Table                     Men   Women  1/2 Average Risk   3.4   3.3   TRIG 75 07/22/2019   CHOLHDL 2.7 10/31/2007    Significant Diagnostic Results in last 30 days:  DG Chest 2 View  Result Date: 09/30/2019 CLINICAL DATA:  Suspected sepsis EXAM: CHEST - 2 VIEW COMPARISON:  Radiograph 07/15/2019 FINDINGS: Slight hyperinflation with flattening of the diaphragms. Coarse fine reticular opacities throughout the lungs are similar to comparison exams. No consolidation, features of edema, pneumothorax, or effusion. Pulmonary vascularity remains well-defined. The cardiomediastinal contours are unremarkable. No acute osseous or soft tissue abnormality. Degenerative changes are present in the imaged spine and shoulders. Telemetry leads overlie the chest. IMPRESSION: Chronically coarsened interstitial changes in the lungs with hyperinflation. No acute cardiopulmonary abnormality. Electronically Signed   By: Lovena Le M.D.   On: 09/30/2019 01:17    DG Abd 2 Views  Result Date: 09/30/2019 CLINICAL DATA:  Right lower quadrant abdominal pain EXAM: ABDOMEN - 2 VIEW COMPARISON:  07/15/2019 FINDINGS: Nonobstructive bowel gas pattern. No organomegaly or free air. No suspicious calcification. Surgical clips in the left pelvis. No acute bony abnormality. IMPRESSION: No acute findings. Electronically Signed   By: Rolm Baptise M.D.   On: 09/30/2019 08:57    Assessment/Plan 1. Dyspnea on exertion Has improved status post aspiration pneumonia.Bilateral lungs CTA. - continue on Augmentin. - CBC with Differential/Platelet - CMP with eGFR([facility]) - Ambulatory referral to [place]  2. Essential hypertension B/p controlled.continue current medication.  - CBC with Differential/Platelet - CMP with eGFR([facility])  3. Type 2 diabetes mellitus without complication, without long-term current use of insulin (HCC) Lab Results  Component Value Date   HGBA1C 6.3 (H) 06/27/2019   Diet controlled.  4. Anemia of chronic disease Hgb low but stable. - CBC with Differential/Platelet  5. Unsteady gait No recent falls.Will refer to [facility] Physical therapy  for ROM,exercise ,gait stability and muscle strengthening.  - Ambulatory referral to [facility]  6. Generalized weakness Status post Aspiration pneumonia.Home healthy physical Therapy as above.  - Ambulatory referral to Home Health  7. Aspiration pneumonia, unspecified aspiration pneumonia type, unspecified laterality, unspecified part of lung ([place]) Afebrile.Has improved. continue on Augmentin. - guaiFENesin (ROBITUSSIN) 100 MG/5ML SOLN; Take 5 mLs (100 mg total) by mouth every 4 (four) hours as needed for cough or to loosen phlegm.  Dispense: 236 mL; Refill: 0  8. Memory Loss  Daughter declined screening patient today requested evaluation to be done by PCP Dr.Reed thought was here today for memory screening.    Family/ staff Communication: Reviewed plan of care with patient and daughter.  Labs/tests  ordered:  - CBC with Differential/Platelet - CMP with eGFR([facility])  Next Appointment : 2 weeks with Dr.Reed for memory loss  Sandrea Hughs, NP

## 2019-10-03 NOTE — TOC Transition Note (Signed)
Transition of Care [facility]) - CM/SW Discharge Note   Patient Details  Name: Gloria Murphy MRN: [id number] Date of Birth: 05-27-1935  Transition [facility] CM/SW Contact:  Bethann Berkshire, LCSW Phone Number: 10/03/2019, 2:38 PM   Clinical Narrative:     CSW arranged [place] with [facility]. No other needs identified. Pt. Daughter Katharine Look will transport home. TOC sign off.  Final next level of care: [place] Barriers to Discharge: No Barriers Identified   Patient Goals and CMS Choice Patient states their goals for this hospitalization and ongoing recovery are:: get some help at home CMS Medicare.gov Compare Post Acute Care list provided to:: Patient Choice offered to / list presented to : Patient  Discharge Placement                       Discharge Plan and Services     Post Acute Care Choice: [place] Agency: [facility] Date [place]: 10/03/19   Representative spoke with at [place]: [place] ([place]) Interventions     Readmission Risk Interventions No flowsheet data found.

## 2019-10-03 NOTE — Discharge Summary (Signed)
[facility]  Physician Discharge Summary   Patient ID: Gloria Murphy MRN: [id number] DOB/AGE: March 25, 1936 84 y.o.  Admit date: 09/30/2019 Discharge date: 10/03/2019  PCP: Gayland Curry, DO  DISCHARGE DIAGNOSES:  Aspiration pneumonia Diabetes mellitus type 2 Anemia of chronic disease Essential hypertension  RECOMMENDATIONS FOR OUTPATIENT FOLLOW UP: 1. Follow-up with PCP   Home Health: PT OT RN Equipment/Devices: None  CODE STATUS: DNR  DISCHARGE CONDITION: fair  Diet recommendation: Modified carbohydrate  INITIAL HISTORY: 84 year old lady with prior history of hypertension, diabetes, anemia of chronic disease, recently admitted for small bowel obstruction and aspiration pneumonia presented with fever and chills and nonproductive cough. On arrival to ED she was found to be febrile and hypoxic requiring 2 lit of [place] oxygen. Chest x-ray did not show any new pneumonia or infiltrates, urine analysis negative, abdominal x-rays did not show any SBO. Given persistent fever and tachycardia patient was empirically started on broad-spectrum IV antibiotics and referred to [facility] for admission.    HOSPITAL COURSE:   SIRS possibly from aspiration pneumonia Patient was febrile, tachycardic and hypoxic on admission. She was empirically started on broad spectrum IV antibiotics and transitioned to IV unasyn.    She showed improvement.  She was weaned off of oxygen.  She was transitioned to Augmentin.  Seen by speech therapy.  Feels much better.  Wants to go home.    Bacteremia, coag negative staph 1 of 4 blood culture grew coag negative staph.  Most likely a contaminant.    Type 2 DM Not on meds at home.  HbA1c was 6.3 back in February.  Anemia of chronic disease: Hemoglobin stable around 10.  Anemia panel does not suggest iron deficiency.  B12 folate levels normal.  Essential hypertension Blood pressure is reasonably well controlled.  Noted to be on carvedilol and  amlodipine.    Normal anion gap metabolic acidosis Bicarbonate level stable.    Moderate malnutrition Nutrition Problem: Moderate Malnutrition Etiology: chronic illness  Signs/Symptoms: mild fat depletion, mild muscle depletion, percent weight loss(15.1% weight loss in 7 months) Percent weight loss: 15.1 %(7 months)  Interventions: Ensure Enlive (each supplement provides 350kcal and 20 grams of protein), Magic cup, Liberalize Diet  Overall stable.  Okay for discharge home today.  Discussed with patient and her daughter.   PERTINENT LABS:  The results of significant diagnostics from this hospitalization (including imaging, microbiology, ancillary and laboratory) are listed below for reference.    Microbiology: Recent Results (from the past 240 hour(s))  Culture, blood (Routine x 2)     Status: None (Preliminary result)   Collection Time: 09/30/19 12:22 AM   Specimen: BLOOD  Result Value Ref Range Status   Specimen Description BLOOD LEFT HAND  Final   Special Requests   Final    BOTTLES DRAWN AEROBIC AND ANAEROBIC Blood Culture results may not be optimal due to an inadequate volume of blood received in culture bottles   Culture   Final    NO GROWTH 3 DAYS Performed at [facility], [place] [address], [place], [place] [address]    Report Status PENDING  Incomplete  Culture, blood (Routine x 2)     Status: Abnormal   Collection Time: 09/30/19 12:22 AM   Specimen: BLOOD  Result Value Ref Range Status   Specimen Description BLOOD LEFT ARM  Final   Special Requests   Final    BOTTLES DRAWN AEROBIC AND ANAEROBIC Blood Culture adequate volume   Culture  Setup Time  Final    GRAM POSITIVE COCCI IN CLUSTERS AEROBIC BOTTLE ONLY CRITICAL RESULT CALLED TO, READ BACK BY AND VERIFIED WITH: PHARMD F WILSON 1545 [phone] FCP    Culture (A)  Final    STAPHYLOCOCCUS SPECIES (COAGULASE NEGATIVE) THE SIGNIFICANCE OF ISOLATING THIS ORGANISM FROM A SINGLE SET OF BLOOD CULTURES WHEN  MULTIPLE SETS ARE DRAWN IS UNCERTAIN. PLEASE NOTIFY THE MICROBIOLOGY DEPARTMENT WITHIN ONE WEEK IF SPECIATION AND SENSITIVITIES ARE REQUIRED. Performed at [facility], [place] [address], [place], [place] [address]    Report Status 10/03/2019 FINAL  Final  Blood Culture ID Panel (Reflexed)     Status: None   Collection Time: 09/30/19 12:22 AM  Result Value Ref Range Status   Enterococcus species NOT DETECTED NOT DETECTED Final    Comment: CRITICAL RESULT CALLED TO, READ BACK BY AND VERIFIED WITH: PHARMD F WILSON 1545 [id number] FCP    Listeria monocytogenes NOT DETECTED NOT DETECTED Final   Staphylococcus species NOT DETECTED NOT DETECTED Final   Staphylococcus aureus (BCID) NOT DETECTED NOT DETECTED Final   Streptococcus species NOT DETECTED NOT DETECTED Final   Streptococcus agalactiae NOT DETECTED NOT DETECTED Final   Streptococcus pneumoniae NOT DETECTED NOT DETECTED Final   Streptococcus pyogenes NOT DETECTED NOT DETECTED Final   Acinetobacter baumannii NOT DETECTED NOT DETECTED Final   Enterobacteriaceae species NOT DETECTED NOT DETECTED Final   Enterobacter cloacae complex NOT DETECTED NOT DETECTED Final   Escherichia coli NOT DETECTED NOT DETECTED Final   Klebsiella oxytoca NOT DETECTED NOT DETECTED Final   Klebsiella pneumoniae NOT DETECTED NOT DETECTED Final   Proteus species NOT DETECTED NOT DETECTED Final   Serratia marcescens NOT DETECTED NOT DETECTED Final   Haemophilus influenzae NOT DETECTED NOT DETECTED Final   Neisseria meningitidis NOT DETECTED NOT DETECTED Final   Pseudomonas aeruginosa NOT DETECTED NOT DETECTED Final   Candida albicans NOT DETECTED NOT DETECTED Final   Candida glabrata NOT DETECTED NOT DETECTED Final   Candida krusei NOT DETECTED NOT DETECTED Final   Candida parapsilosis NOT DETECTED NOT DETECTED Final   Candida tropicalis NOT DETECTED NOT DETECTED Final    Comment: Performed at [facility], [place]. [address] [place], [place] [address]    Urine culture     Status: None   Collection Time: 09/30/19 12:30 AM   Specimen: In/Out Cath Urine  Result Value Ref Range Status   Specimen Description IN/OUT CATH URINE  Final   Special Requests NONE  Final   Culture   Final    NO GROWTH Performed at [facility], [place] [address] [place], [place] [address]    Report Status 10/01/2019 FINAL  Final  SARS Coronavirus 2 by RT PCR (hospital order, performed in [facility] hospital lab) Nasopharyngeal Nasopharyngeal Swab     Status: None   Collection Time: 09/30/19  2:34 AM   Specimen: Nasopharyngeal Swab  Result Value Ref Range Status   SARS Coronavirus 2 NEGATIVE NEGATIVE Final    Comment: (NOTE) SARS-CoV-2 target nucleic acids are NOT DETECTED. The SARS-CoV-2 RNA is generally detectable in upper and lower respiratory specimens during the acute phase of infection. The lowest concentration of SARS-CoV-2 viral copies this assay can detect is 250 copies / mL. A negative result does not preclude SARS-CoV-2 infection and should not be used as the sole basis for treatment or other patient management decisions.  A negative result may occur with improper specimen collection / handling, submission of specimen other than nasopharyngeal swab, presence  of viral mutation(s) within the areas targeted by this assay, and inadequate number of viral copies (<250 copies / mL). A negative result must be combined with clinical observations, patient history, and epidemiological information. Fact Sheet for Patients:   StrictlyIdeas.no Fact Sheet for Healthcare Providers: BankingDealers.co.za This test is not yet approved or cleared  by the [place] FDA and has been authorized for detection and/or diagnosis of SARS-CoV-2 by FDA under an Emergency Use Authorization (EUA).  This EUA will remain in effect (meaning this test can be used) for the duration of the COVID-19 declaration under Section  564(b)(1) of the Act, 21 U.S.C. section 360bbb-3(b)(1), unless the authorization is terminated or revoked sooner. Performed at [facility], [place] [address] [place], [place] [address]      Labs:  COVID-19 Labs  Recent Labs    10/01/19 1130  FERRITIN 624*    Lab Results  Component Value Date   SARSCOV2NAA NEGATIVE 09/30/2019   [place] NEGATIVE 07/23/2019   [place] NEGATIVE 06/26/2019   SARSCOV2NAA NOT DETECTED 06/24/2019      Basic Metabolic Panel: Recent Labs  Lab 09/30/19 0025 09/30/19 0435 10/01/19 1130 10/01/19 1245 10/01/19 1953 10/02/19 0212  NA 136 138 137  --  138 138  K 4.1 3.5 2.6*  --  3.6 4.0  CL 106 113* 109  --  109 109  CO2 20* 16* 20*  --  19* 20*  GLUCOSE 143* 138* 125*  --  172* 111*  BUN 11 8 5*  --  8 6*  CREATININE 0.77 0.65 0.63  --  0.59 0.58  CALCIUM 8.8* 8.4* 8.5*  --  8.8* 9.0  MG  --   --   --  1.6*  --   --    Liver Function Tests: Recent Labs  Lab 09/30/19 0025 09/30/19 0435  AST 26 23  ALT 16 13  ALKPHOS 77 66  BILITOT 0.5 0.6  PROT 7.6 7.1  ALBUMIN 2.5* 2.3*   CBC: Recent Labs  Lab 09/30/19 0025 09/30/19 0435 10/02/19 0212  WBC 6.5 5.5 4.3  NEUTROABS 5.0 3.8 2.7  HGB 10.9* 10.8* 10.4*  HCT 34.5* 35.6* 31.9*  MCV 82.1 84.2 79.6*  PLT 262 226 268    CBG: Recent Labs  Lab 10/02/19 1149 10/02/19 1557 10/02/19 2156 10/03/19 0717 10/03/19 1114  GLUCAP 154* 151* 129* 103* 179*     IMAGING STUDIES DG Chest 2 View  Result Date: 09/30/2019 CLINICAL DATA:  Suspected sepsis EXAM: CHEST - 2 VIEW COMPARISON:  Radiograph 07/15/2019 FINDINGS: Slight hyperinflation with flattening of the diaphragms. Coarse fine reticular opacities throughout the lungs are similar to comparison exams. No consolidation, features of edema, pneumothorax, or effusion. Pulmonary vascularity remains well-defined. The cardiomediastinal contours are unremarkable. No acute osseous or soft tissue abnormality. Degenerative changes  are present in the imaged spine and shoulders. Telemetry leads overlie the chest. IMPRESSION: Chronically coarsened interstitial changes in the lungs with hyperinflation. No acute cardiopulmonary abnormality. Electronically Signed   By: Lovena Le M.D.   On: 09/30/2019 01:17   DG Abd 2 Views  Result Date: 09/30/2019 CLINICAL DATA:  Right lower quadrant abdominal pain EXAM: ABDOMEN - 2 VIEW COMPARISON:  07/15/2019 FINDINGS: Nonobstructive bowel gas pattern. No organomegaly or free air. No suspicious calcification. Surgical clips in the left pelvis. No acute bony abnormality. IMPRESSION: No acute findings. Electronically Signed   By: Rolm Baptise M.D.   On: 09/30/2019 08:57    DISCHARGE EXAMINATION: Vitals:   10/02/19  2154 10/03/19 0719 10/03/19 0719 10/03/19 0827  BP: 140/74 139/79 139/79 140/76  Pulse: 90 89 89 87  Resp: 17 16 16 18   Temp: 98.1 F (36.7 C) 99 F (37.2 C) 99 F (37.2 C) 98.2 F (36.8 C)  TempSrc: Oral Oral  Oral  SpO2: 100% 100% 100% 100%  Weight:      Height:       General appearance: Awake alert.  In no distress Resp: Improved air entry bilaterally.  Few crackles at the bases.  Normal effort. Cardio: S1-S2 is normal regular.  No S3-S4.  No rubs murmurs or bruit GI: Abdomen is soft.  Nontender nondistended.  Bowel sounds are present normal.  No masses organomegaly    DISPOSITION: Home  Discharge Instructions    Call MD for:  difficulty breathing, headache or visual disturbances   Complete by: As directed    Call MD for:  extreme fatigue   Complete by: As directed    Call MD for:  persistant dizziness or light-headedness   Complete by: As directed    Call MD for:  persistant nausea and vomiting   Complete by: As directed    Call MD for:  severe uncontrolled pain   Complete by: As directed    Call MD for:  temperature >100.4   Complete by: As directed    Diet - low sodium heart healthy   Complete by: As directed    Diet Carb Modified   Complete by: As  directed    Discharge instructions   Complete by: As directed    Please take your medications as prescribed.  Follow-up with your primary care provider.  You were cared for by a hospitalist during your hospital stay. If you have any questions about your discharge medications or the care you received while you were in the hospital after you are discharged, you can call the unit and asked to speak with the hospitalist on call if the hospitalist that took care of you is not available. Once you are discharged, your primary care physician will handle any further medical issues. Please note that NO REFILLS for any discharge medications will be authorized once you are discharged, as it is imperative that you return to your primary care physician (or establish a relationship with a primary care physician if you do not have one) for your aftercare needs so that they can reassess your need for medications and monitor your lab values. If you do not have a primary care physician, you can call [phone] for a physician referral.   Increase activity slowly   Complete by: As directed         Allergies as of 10/03/2019   No Known Allergies     Medication List    TAKE these medications   amLODipine 5 MG tablet Commonly known as: NORVASC Take 5 mg by mouth daily.   amoxicillin-clavulanate 875-125 MG tablet Commonly known as: Augmentin Take 1 tablet by mouth 2 (two) times daily for 4 days. for aspiration pneumonia   aspirin EC 81 MG tablet Take 1 tablet (81 mg total) by mouth daily.   CALTRATE 600+D3 PO Take 1 tablet by mouth daily.   carvedilol 6.25 MG tablet Commonly known as: COREG TAKE 1 TABLET(6.25 MG) BY MOUTH TWICE DAILY WITH A MEAL   famotidine 20 MG tablet Commonly known as: PEPCID Take 20 mg by mouth as needed.   MAGNESIUM OXIDE PO Take 1 tablet by mouth daily.   potassium chloride SA 20  MEQ tablet Commonly known as: KLOR-CON Take 1 tablet (20 mEq total) by mouth daily.     Refresh Tears 0.5 % Soln Generic drug: carboxymethylcellulose Place 1 drop into both eyes 3 (three) times daily.        Follow-up Information    Reed, Tiffany L, DO. Schedule an appointment as soon as possible for a visit.   Specialty: Geriatric Medicine Contact information: [place]. [place] [address] [phone]           TOTAL DISCHARGE TIME: 1 minutes  [facility] Pager on [email or web address]  10/03/2019, 3:30 PM

## 2019-10-03 NOTE — Discharge Instructions (Signed)
Aspiration Pneumonia Aspiration pneumonia is an infection in the lungs. It occurs when saliva or liquid contaminated with bacteria is inhaled (aspirated) into the lungs. When these things get into the lungs, swelling (inflammation) and infection can occur. This can make it difficult to breathe. Aspiration pneumonia is a serious condition and can be life threatening. What are the causes? This condition is caused when saliva or liquid from the mouth, throat, or stomach is inhaled into the lungs, and when those fluids are contaminated with bacteria. What increases the risk? The following factors may make you more likely to develop this condition:  A narrowing of the tube that carries food to the stomach (esophageal narrowing).  Having gastroesophageal reflux disease (GERD).  Having a weak immune system.  Having diabetes.  Having poor oral hygiene.  Being malnourished. The condition is more likely to occur when a person's cough (gag) reflex, or ability to swallow, has decreased. Some things that can cause this decrease include:  Having a brain injury or disease, such as stroke, seizures, Parkinson disease, dementia, or amyotrophic lateral sclerosis (ALS).  Being given a general anesthetic for procedures.  Drinking too much alcohol. If a person passes out and vomits, vomit can be inhaled into the lungs.  Taking certain medicines, such as tranquilizers or sedatives. What are the signs or symptoms? Symptoms of this condition include:  Fever.  A cough with secretions that are yellow, tan, or green.  Breathing problems, such as wheezing or shortness of breath.  Chest pain.  Being more tired than usual (fatigue).  Having a history of coughing while eating or drinking.  Bad breath.  Bluish color to the lips, skin, or fingers. How is this diagnosed? This condition may be diagnosed based on:  A physical exam.  Tests, such as: ? Chest X-ray. ? Sputum culture. Saliva and mucus  (sputum) are collected from the lungs or the tubes that carry air to the lungs (bronchi). The sputum is then tested for bacteria. ? Oximetry. A sensor or clip is placed on areas such as a finger, earlobe, or toe to measure the oxygen level in your blood. ? Blood tests. ? Swallowing study. This test looks at how food is swallowed and whether it goes into your breathing tube (trachea) or esophagus. ? Bronchoscopy. This test uses a flexible tube (bronchoscope) to see inside the lungs. How is this treated? This condition may be treated with:  Medicines. Antibiotic medicine will be given to kill the pneumonia bacteria. Other medicines may also be used to reduce fever or pain.  Breathing assistance and oxygen therapy. Depending on how well you are breathing, you may need to be given oxygen, or you may need breathing support from a breathing machine (ventilator).  Thoracentesis. This is a procedure to remove fluid that has built up in the space between the linings of the chest wall and the lungs.  Feeding tube and diet change. For people who have difficulty swallowing, a feeding tube might be placed in the stomach, or they may be asked to avoid certain food textures or liquids when eating. Follow these instructions at home: Medicines  Take over-the-counter and prescription medicines only as told by your health care provider. ? If you were prescribed an antibiotic medicine, take it as told by your health care provider. Do not stop taking the antibiotic even if you start to feel better. ? Take cough medicine only if you are losing sleep. Cough medicine can prevent your body's natural ability to remove mucus   from your lungs. General instructions  Carefully follow any eating instructions you were given, such as avoiding certain food textures or thickening your liquids. Thickening liquids reduces the risk of developing aspiration pneumonia again.  Use breathing exercises such as postural drainage, deep  breathing, and incentive spirometry to help expel secretions.  Rest as instructed by your health care provider.  Sleep in a semi-upright position at night. Try to sleep in a reclining chair, or place a few pillows under your head.  Do not use any products that contain nicotine or tobacco, such as cigarettes and e-cigarettes. If you need help quitting, ask your health care provider.  Keep all follow-up visits as told by your health care provider. This is important. Contact a health care provider if:  You have a fever.  You have a worsening cough with yellow, tan, or green secretions.  You have coughing while eating or drinking. Get help right away if:  You have worsening shortness of breath, wheezing, or difficulty breathing.  You have chest pain. Summary  Aspiration pneumonia is an infection in the lungs. It is caused when saliva or liquid from the mouth, throat, or stomach is inhaled into the lungs.  Aspiration pneumonia is more likely to occur when a person's cough reflex or ability to swallow has decreased.  Symptoms of aspiration pneumonia include coughing, breathing problems, fever, and chest pain.  Aspiration pneumonia may be treated with antibiotic medicine, other medicines to reduce pain or fever, and breathing assistance or oxygen therapy. This information is not intended to replace advice given to you by your health care provider. Make sure you discuss any questions you have with your health care provider. Document Revised: 04/07/2017 Document Reviewed: 05/31/2016 Elsevier Patient Education  2020 Elsevier Inc.  

## 2019-10-03 NOTE — Telephone Encounter (Signed)
Transition Care Management Follow-up Telephone Call  Date of discharge and from where: 10/03/2019 [place]  How have you been since you were released from the hospital? Daughter states much better Any questions or concerns? Yes   Still has concerns regarding patient's breathing but hospital states O2 is good. Still has some coughing.   Items Reviewed:  Did the pt receive and understand the discharge instructions provided? Yes     Medications obtained and verified? Yes  spoke with daughter, Gloria Murphy  Any new allergies since your discharge? No   Dietary orders reviewed? Yes  Do you have support at home? Yes  daughter  Other (ie: DME, Home Health, etc)   Functional Questionnaire: (I = Independent and D = Dependent) ADL's: I with assistance  Bathing/Dressing- I with assistance   Meal Prep- D  Eating- I  Maintaining continence- I  Transferring/Ambulation- I with assistance Walker  Managing Meds- D    Follow up appointments reviewed:    PCP Hospital f/u appt confirmed? Yes  Scheduled to see Dinah on 10/03/19.  [facility] f/u appt confirmed? No    Are transportation arrangements needed? No   If their condition worsens, is the pt aware to call  their PCP or go to the ED? Yes  Was the patient provided with contact information for the PCP's office or ED? Yes  Was the pt encouraged to call back with questions or concerns? Yes

## 2019-10-03 NOTE — Patient Instructions (Signed)
-   Labs drawn to today will call you with results. - Notify provider if shortness of breath worsen or fail to improve  - Home health Physical therapy ordered Agency will call you for appointment.

## 2019-10-05 LAB — CULTURE, BLOOD (ROUTINE X 2): Culture: NO GROWTH

## 2019-10-06 DIAGNOSIS — K56609 Unspecified intestinal obstruction, unspecified as to partial versus complete obstruction: Secondary | ICD-10-CM

## 2019-10-06 DIAGNOSIS — E079 Disorder of thyroid, unspecified: Secondary | ICD-10-CM

## 2019-10-06 DIAGNOSIS — E119 Type 2 diabetes mellitus without complications: Secondary | ICD-10-CM | POA: Diagnosis not present

## 2019-10-06 DIAGNOSIS — E7800 Pure hypercholesterolemia, unspecified: Secondary | ICD-10-CM

## 2019-10-06 DIAGNOSIS — J690 Pneumonitis due to inhalation of food and vomit: Secondary | ICD-10-CM | POA: Diagnosis not present

## 2019-10-06 DIAGNOSIS — D638 Anemia in other chronic diseases classified elsewhere: Secondary | ICD-10-CM | POA: Diagnosis not present

## 2019-10-06 DIAGNOSIS — I10 Essential (primary) hypertension: Secondary | ICD-10-CM | POA: Diagnosis not present

## 2019-10-06 DIAGNOSIS — E440 Moderate protein-calorie malnutrition: Secondary | ICD-10-CM

## 2019-10-09 ENCOUNTER — Telehealth: Payer: Self-pay

## 2019-10-09 ENCOUNTER — Telehealth: Payer: Self-pay | Admitting: *Deleted

## 2019-10-09 DIAGNOSIS — E7800 Pure hypercholesterolemia, unspecified: Secondary | ICD-10-CM | POA: Diagnosis not present

## 2019-10-09 DIAGNOSIS — K56609 Unspecified intestinal obstruction, unspecified as to partial versus complete obstruction: Secondary | ICD-10-CM | POA: Diagnosis not present

## 2019-10-09 DIAGNOSIS — E119 Type 2 diabetes mellitus without complications: Secondary | ICD-10-CM | POA: Diagnosis not present

## 2019-10-09 DIAGNOSIS — I10 Essential (primary) hypertension: Secondary | ICD-10-CM | POA: Diagnosis not present

## 2019-10-09 DIAGNOSIS — E440 Moderate protein-calorie malnutrition: Secondary | ICD-10-CM | POA: Diagnosis not present

## 2019-10-09 DIAGNOSIS — E079 Disorder of thyroid, unspecified: Secondary | ICD-10-CM | POA: Diagnosis not present

## 2019-10-09 DIAGNOSIS — D638 Anemia in other chronic diseases classified elsewhere: Secondary | ICD-10-CM | POA: Diagnosis not present

## 2019-10-09 DIAGNOSIS — J690 Pneumonitis due to inhalation of food and vomit: Secondary | ICD-10-CM | POA: Diagnosis not present

## 2019-10-09 NOTE — Telephone Encounter (Signed)
April with [facility] called requesting verbal orders for 2x2, 1x2 and 1x every other week for 4 weeks.  Verbal orders given.

## 2019-10-09 NOTE — Telephone Encounter (Signed)
Called [place] and gave ok for palliative care.

## 2019-10-09 NOTE — Telephone Encounter (Signed)
Palliative care is fine with me.

## 2019-10-09 NOTE — Telephone Encounter (Signed)
Gloria Murphy from [facility] [phone] option 2) called and is requesting an order for palliative care.

## 2019-10-10 DIAGNOSIS — E119 Type 2 diabetes mellitus without complications: Secondary | ICD-10-CM | POA: Diagnosis not present

## 2019-10-10 DIAGNOSIS — J690 Pneumonitis due to inhalation of food and vomit: Secondary | ICD-10-CM | POA: Diagnosis not present

## 2019-10-10 DIAGNOSIS — D638 Anemia in other chronic diseases classified elsewhere: Secondary | ICD-10-CM | POA: Diagnosis not present

## 2019-10-10 DIAGNOSIS — K56609 Unspecified intestinal obstruction, unspecified as to partial versus complete obstruction: Secondary | ICD-10-CM | POA: Diagnosis not present

## 2019-10-10 DIAGNOSIS — E7800 Pure hypercholesterolemia, unspecified: Secondary | ICD-10-CM | POA: Diagnosis not present

## 2019-10-10 DIAGNOSIS — E079 Disorder of thyroid, unspecified: Secondary | ICD-10-CM | POA: Diagnosis not present

## 2019-10-10 DIAGNOSIS — E440 Moderate protein-calorie malnutrition: Secondary | ICD-10-CM | POA: Diagnosis not present

## 2019-10-10 DIAGNOSIS — I10 Essential (primary) hypertension: Secondary | ICD-10-CM | POA: Diagnosis not present

## 2019-10-14 ENCOUNTER — Telehealth: Payer: Self-pay | Admitting: Nurse Practitioner

## 2019-10-14 NOTE — Telephone Encounter (Signed)
Spoke with daughter, Alver Sorrow, regarding Palliative referral from hospital and explained Palliative services to her and all questions were answered. Daughter requested to call me back so that she could have a conference call about this with her 2 other sisters, so daughter will call me back on 10/15/19 @ 10 AM.

## 2019-10-15 ENCOUNTER — Telehealth: Payer: Self-pay | Admitting: Nurse Practitioner

## 2019-10-15 DIAGNOSIS — D638 Anemia in other chronic diseases classified elsewhere: Secondary | ICD-10-CM | POA: Diagnosis not present

## 2019-10-15 DIAGNOSIS — I10 Essential (primary) hypertension: Secondary | ICD-10-CM | POA: Diagnosis not present

## 2019-10-15 DIAGNOSIS — J690 Pneumonitis due to inhalation of food and vomit: Secondary | ICD-10-CM | POA: Diagnosis not present

## 2019-10-15 DIAGNOSIS — E440 Moderate protein-calorie malnutrition: Secondary | ICD-10-CM | POA: Diagnosis not present

## 2019-10-15 DIAGNOSIS — E7800 Pure hypercholesterolemia, unspecified: Secondary | ICD-10-CM | POA: Diagnosis not present

## 2019-10-15 DIAGNOSIS — E119 Type 2 diabetes mellitus without complications: Secondary | ICD-10-CM | POA: Diagnosis not present

## 2019-10-15 DIAGNOSIS — E079 Disorder of thyroid, unspecified: Secondary | ICD-10-CM | POA: Diagnosis not present

## 2019-10-15 DIAGNOSIS — K56609 Unspecified intestinal obstruction, unspecified as to partial versus complete obstruction: Secondary | ICD-10-CM | POA: Diagnosis not present

## 2019-10-15 NOTE — Telephone Encounter (Signed)
This encounter was created in error - please disregard.

## 2019-10-16 ENCOUNTER — Telehealth: Payer: Self-pay | Admitting: Nurse Practitioner

## 2019-10-16 NOTE — Telephone Encounter (Signed)
Called daughter, Silva Bandy back to let her know that I could get her scheduled on 11/12/19 @ 1:30 PMfor an [place] and she was going to check with her Dad to make sure that he is going to be there at that time and she will get back with me.

## 2019-10-16 NOTE — Telephone Encounter (Signed)
Daughter Kenney Houseman, had left a message wanting me to send a message to [place] to confirm the appointment for the patient.  Sent email to [place] to let her know that I have scheduled an [place] for 11/12/19 @ 1:30 PM.

## 2019-10-16 NOTE — Telephone Encounter (Signed)
Spoke with both daughters, Silva Bandy and Kenney Houseman on a conference call, regarding Palliative services and all questions were answered.  They were in agreement with starting Palliative services with patient.  I explained to daughters that due to the lengthy drive time to [facility] for [facility] that I was going to work on rearranging her scheduled and that I would get back with Silva Bandy to let her know when I could get her scheduled and they were in agreement with this.

## 2019-10-17 DIAGNOSIS — I10 Essential (primary) hypertension: Secondary | ICD-10-CM | POA: Diagnosis not present

## 2019-10-17 DIAGNOSIS — E440 Moderate protein-calorie malnutrition: Secondary | ICD-10-CM | POA: Diagnosis not present

## 2019-10-17 DIAGNOSIS — D638 Anemia in other chronic diseases classified elsewhere: Secondary | ICD-10-CM | POA: Diagnosis not present

## 2019-10-17 DIAGNOSIS — E079 Disorder of thyroid, unspecified: Secondary | ICD-10-CM | POA: Diagnosis not present

## 2019-10-17 DIAGNOSIS — E119 Type 2 diabetes mellitus without complications: Secondary | ICD-10-CM | POA: Diagnosis not present

## 2019-10-17 DIAGNOSIS — J690 Pneumonitis due to inhalation of food and vomit: Secondary | ICD-10-CM | POA: Diagnosis not present

## 2019-10-17 DIAGNOSIS — K56609 Unspecified intestinal obstruction, unspecified as to partial versus complete obstruction: Secondary | ICD-10-CM | POA: Diagnosis not present

## 2019-10-17 DIAGNOSIS — E7800 Pure hypercholesterolemia, unspecified: Secondary | ICD-10-CM | POA: Diagnosis not present

## 2019-10-18 DIAGNOSIS — I10 Essential (primary) hypertension: Secondary | ICD-10-CM | POA: Diagnosis not present

## 2019-10-18 DIAGNOSIS — D638 Anemia in other chronic diseases classified elsewhere: Secondary | ICD-10-CM | POA: Diagnosis not present

## 2019-10-18 DIAGNOSIS — E119 Type 2 diabetes mellitus without complications: Secondary | ICD-10-CM | POA: Diagnosis not present

## 2019-10-18 DIAGNOSIS — J690 Pneumonitis due to inhalation of food and vomit: Secondary | ICD-10-CM | POA: Diagnosis not present

## 2019-10-18 DIAGNOSIS — E7800 Pure hypercholesterolemia, unspecified: Secondary | ICD-10-CM | POA: Diagnosis not present

## 2019-10-18 DIAGNOSIS — K56609 Unspecified intestinal obstruction, unspecified as to partial versus complete obstruction: Secondary | ICD-10-CM | POA: Diagnosis not present

## 2019-10-18 DIAGNOSIS — E079 Disorder of thyroid, unspecified: Secondary | ICD-10-CM | POA: Diagnosis not present

## 2019-10-18 DIAGNOSIS — E440 Moderate protein-calorie malnutrition: Secondary | ICD-10-CM | POA: Diagnosis not present

## 2019-10-21 ENCOUNTER — Other Ambulatory Visit: Payer: Self-pay

## 2019-10-21 ENCOUNTER — Ambulatory Visit (INDEPENDENT_AMBULATORY_CARE_PROVIDER_SITE_OTHER): Payer: Medicare Other | Admitting: Internal Medicine

## 2019-10-21 ENCOUNTER — Encounter: Payer: Self-pay | Admitting: Internal Medicine

## 2019-10-21 VITALS — BP 118/58 | HR 83 | Temp 98.7°F | Ht 69.0 in | Wt 150.0 lb

## 2019-10-21 DIAGNOSIS — G301 Alzheimer's disease with late onset: Secondary | ICD-10-CM

## 2019-10-21 DIAGNOSIS — R630 Anorexia: Secondary | ICD-10-CM

## 2019-10-21 DIAGNOSIS — Z66 Do not resuscitate: Secondary | ICD-10-CM

## 2019-10-21 DIAGNOSIS — R1312 Dysphagia, oropharyngeal phase: Secondary | ICD-10-CM | POA: Diagnosis not present

## 2019-10-21 DIAGNOSIS — R531 Weakness: Secondary | ICD-10-CM | POA: Diagnosis not present

## 2019-10-21 DIAGNOSIS — I10 Essential (primary) hypertension: Secondary | ICD-10-CM

## 2019-10-21 DIAGNOSIS — F0280 Dementia in other diseases classified elsewhere without behavioral disturbance: Secondary | ICD-10-CM

## 2019-10-21 MED ORDER — MIRTAZAPINE 7.5 MG PO TABS: 7.5000 mg | ORAL_TABLET | Freq: Every day | ORAL | 1 refills | Status: DC

## 2019-10-21 NOTE — Patient Instructions (Signed)
Try remeron 7.5mg  at bedtime for appetite.

## 2019-10-21 NOTE — Progress Notes (Signed)
Location:  [facility] Provider:  Chrishun Scheer L. Mariea Clonts, D.O., C.M.D.  Code Status: DNR Goals of Care:  Advanced Directives 10/21/2019  Does Patient Have a Medical Advance Directive? Yes  Type of Paramedic of [place];Living will  Does patient want to make changes to medical advance directive? No - Patient declined  Copy of [place] in Chart? Yes - validated most recent copy scanned in chart (See row information)  Would patient like information on creating a medical advance directive? -     Chief Complaint  Patient presents with  . Medical Management of Chronic Issues    2 week follow up and memory evaluation     HPI: Patient is a 84 y.o. female seen today for medical management of chronic diseases.  She is here with her daughter, Kenney Houseman, this time--she lives in [place].    She has an upcoming appt with pulmonary and also one with palliative care nurse ([facility]).    She's here about her memory.  She thinks her memory is doing ok for her age.    She can see well.  She scored 22/30 on her mmse and failed clock and pentagons.  Memory is a lot better than when she first came out of hospital and rehab--had made up a lot of stroies.  She is now just having more short-term memory loss.   Family administers medications b/c of number of pills (daughter when she is here and husband when she's not).  Her husband keeps on her with mouthwash, brushing teeth, eye drops.  She has looked at some pics of herself and not realized they were here (she was much younger).  One of them was their wedding.    Appetite:  Back and forth--she only eats a little.  Pt herself does not have concerns about it.   After hospital d/c, appetite has picked back up.  She says she is not hungry, but needs to eat to take her meds.  At home, she was 144, but had been 150 here on our scale.  [facility] had weighed her and she was down 4 lbs by their scale.    Stools are soft and kind of runny.   Has to go in morning and then maybe two hours later.  Has not taken any miralax here lately.  She'd had two days of it, then it became runny and soft and stopped after that.    We reviewed the studies already done for her dyspnea and cough and now she has a pulmonary assessment b/c it's not felt to be cardiac.  Discussed dysphagia and recurrent aspiration as part of the problem. Past Medical History:  Diagnosis Date  . Diabetes mellitus without complication ([place])   . High cholesterol   . Hypertension   . Thyroid disease     Past Surgical History:  Procedure Laterality Date  . ABDOMINAL HYSTERECTOMY    . EYE SURGERY Bilateral 06/2016   Cataract  . KNEE SURGERY Right   . LAPAROTOMY N/A 08/31/2017   Procedure: EXPLORATORY LAPAROTOMY; BILATERAL SALPINGOOPHERECTOMY; OMENTECTOMY, TUMOR DEBULKING;  Surgeon: Everitt Amber, MD;  Location: [facility];  Service: Gynecology;  Laterality: N/A;  . LAPAROTOMY N/A 07/01/2019   Procedure: EXPLORATORY LAPAROTOMY;  Surgeon: Jovita Kussmaul, MD;  Location: [facility];  Service: General;  Laterality: N/A;  . MASS EXCISION N/A 08/31/2017   Procedure: RESECTION OF PELVIC MASS;  Surgeon: Everitt Amber, MD;  Location: [facility];  Service: Gynecology;  Laterality: N/A;  .  RIGHT HEART CATH N/A 02/11/2019   Procedure: RIGHT HEART CATH;  Surgeon: Nigel Mormon, MD;  Location: [facility];  Service: Cardiovascular;  Laterality: N/A;  . SMALL INTESTINE SURGERY      No Known Allergies  Outpatient Encounter Medications as of 10/21/2019  Medication Sig  . amLODipine (NORVASC) 5 MG tablet Take 5 mg by mouth daily.  [name] aspirin EC 81 MG tablet Take 1 tablet (81 mg total) by mouth daily.  . Calcium Carb-Cholecalciferol (CALTRATE 600+D3 PO) Take 1 tablet by mouth daily.  . carboxymethylcellulose (REFRESH TEARS) 0.5 % SOLN Place 1 drop into both eyes 3 (three) times daily.  . carvedilol (COREG) 6.25 MG tablet TAKE 1 TABLET(6.25 MG) BY MOUTH TWICE DAILY WITH A MEAL  .  chlorhexidine (PERIDEX) 0.12 % solution Use as directed 15 mLs in the mouth or throat daily.  . famotidine (PEPCID) 20 MG tablet Take 20 mg by mouth as needed.   . furosemide (LASIX) 20 MG tablet Take 20 mg by mouth. Take 1/2 pill a day 10mg   . guaiFENesin (ROBITUSSIN) 100 MG/5ML SOLN Take 5 mLs (100 mg total) by mouth every 4 (four) hours as needed for cough or to loosen phlegm.  [name] MAGNESIUM OXIDE PO Take 1 tablet by mouth daily.   . polyethylene glycol (MIRALAX / GLYCOLAX) 17 g packet Take 17 g by mouth as needed.  . potassium chloride SA (KLOR-CON) 20 MEQ tablet Take 1 tablet (20 mEq total) by mouth daily.  . [DISCONTINUED] furosemide (LASIX) 20 MG tablet Take 20 mg by mouth daily.   No facility-administered encounter medications on file as of 10/21/2019.    Review of Systems:  Review of Systems  Constitutional: Positive for malaise/fatigue. Negative for chills, fever and weight loss.       Wt stable over about 2 wks at 150 lb  HENT: Negative for congestion and sore throat.   Eyes: Negative for blurred vision.       Glasses  Respiratory: Positive for cough and shortness of breath.   Cardiovascular: Positive for leg swelling. Negative for chest pain and palpitations.  Gastrointestinal: Positive for constipation and diarrhea. Negative for abdominal pain.  Genitourinary: Negative for dysuria.  Musculoskeletal: Negative for falls and joint pain.  Skin: Negative for itching and rash.  Neurological: Negative for dizziness and loss of consciousness.  Psychiatric/Behavioral: Positive for memory loss. Negative for depression. The patient is not nervous/anxious and does not have insomnia.     Health Maintenance  Topic Date Due  . OPHTHALMOLOGY EXAM  Never done  . URINE MICROALBUMIN  Never done  . TETANUS/TDAP  Never done  . DEXA SCAN  Never done  . PNA vac Low Risk Adult (1 of 2 - PCV13) Never done  . INFLUENZA VACCINE  12/08/2019  . HEMOGLOBIN A1C  12/25/2019  . FOOT EXAM  08/29/2020   . COVID-19 Vaccine  Completed    Physical Exam: Vitals:   10/21/19 1143  BP: (!) 118/58  Pulse: 83  Temp: 98.7 F (37.1 C)  SpO2: 97%  Weight: 150 lb (68 kg)  Height: 5\' 9"  (1.753 m)   Body mass index is 22.15 kg/m. Physical Exam Vitals reviewed.  Constitutional:      General: She is not in acute distress.    Appearance: Normal appearance. She is not toxic-appearing.  HENT:     Head: Normocephalic and atraumatic.  Eyes:     Comments: glasses  Cardiovascular:     Rate and Rhythm: Normal rate  and regular rhythm.     Pulses: Normal pulses.     Heart sounds: Normal heart sounds.  Pulmonary:     Effort: Pulmonary effort is normal.     Breath sounds: Rhonchi present.  Abdominal:     General: Bowel sounds are normal. There is no distension.     Palpations: Abdomen is soft.     Tenderness: There is no abdominal tenderness. There is no guarding or rebound.  Musculoskeletal:        General: Normal range of motion.     Cervical back: Neck supple.     Right lower leg: Edema present.     Left lower leg: Edema present.  Skin:    General: Skin is warm and dry.  Neurological:     General: No focal deficit present.     Mental Status: She is alert and oriented to person, place, and time.     Comments: Short-term memory loss--a little repetition during visit  Psychiatric:        Mood and Affect: Mood normal.        Behavior: Behavior normal.     Labs reviewed: Basic Metabolic Panel: Recent Labs    07/07/19 1000 07/08/19 0500 07/18/19 1445 07/19/19 0500 07/20/19 0443 07/20/19 0443 07/21/19 2333 07/21/19 2333 07/22/19 0828 09/30/19 0025 10/01/19 1130 10/01/19 1130 10/01/19 1245 10/01/19 1953 10/02/19 0212 10/03/19 1539  NA  --    < >  --    < > 134*   < > 135   < > 135   < > 137   < >  --  138 138 137  K  --    < >  --    < > 3.7   < > 3.5   < > 3.6   < > 2.6*   < >  --  3.6 4.0 3.9  CL  --    < >  --    < > 103   < > 105   < > 106   < > 109   < >  --  109 109  103  CO2  --    < >  --    < > 22   < > 22   < > 21*   < > 20*   < >  --  19* 20* 26  GLUCOSE  --    < >  --    < > 151*   < > 103*   < > 115*   < > 125*   < >  --  172* 111* 118  BUN  --    < >  --    < > 25*   < > 23   < > 21   < > 5*   < >  --  8 6* 13  CREATININE  --    < >  --    < > 0.69   < > 0.58   < > 0.62   < > 0.63   < >  --  0.59 0.58 0.69  CALCIUM  --    < >  --    < > 8.7*   < > 8.5*   < > 8.6*   < > 8.5*   < >  --  8.8* 9.0 9.3  MG  --    < >  --    < > 2.0   < > 1.9  --  1.6*  --   --   --  1.6*  --   --   --   PHOS  --    < >  --    < > 3.1  --  2.8  --  2.8  --   --   --   --   --   --   --   TSH 0.064*  --  0.031*  --   --   --   --   --   --   --  0.032*  --   --   --   --   --    < > = values in this interval not displayed.   Liver Function Tests: Recent Labs    07/22/19 0828 07/22/19 0828 09/30/19 0025 09/30/19 0435 10/03/19 1539  AST 51*   < > 26 23 34  ALT 63*   < > 16 13 15   ALKPHOS 322*  --  77 66  --   BILITOT 0.5   < > 0.5 0.6 0.3  PROT 7.1   < > 7.6 7.1 7.1  ALBUMIN 2.0*  --  2.5* 2.3*  --    < > = values in this interval not displayed.   Recent Labs    06/26/19 1352  LIPASE 38   Recent Labs    07/13/19 1600  AMMONIA 16   CBC: Recent Labs    09/30/19 0435 10/02/19 0212 10/03/19 1539  WBC 5.5 4.3 5.3  NEUTROABS 3.8 2.7 3,599  HGB 10.8* 10.4* 11.1*  HCT 35.6* 31.9* 34.0*  MCV 84.2 79.6* 77.3*  PLT 226 268 368   Lipid Panel: Recent Labs    07/15/19 0430 07/21/19 2333 07/22/19 0828  TRIG 77 66 75   Lab Results  Component Value Date   HGBA1C 6.3 (H) 06/27/2019    Procedures since last visit: DG Chest 2 View  Result Date: 09/30/2019 CLINICAL DATA:  Suspected sepsis EXAM: CHEST - 2 VIEW COMPARISON:  Radiograph 07/15/2019 FINDINGS: Slight hyperinflation with flattening of the diaphragms. Coarse fine reticular opacities throughout the lungs are similar to comparison exams. No consolidation, features of edema, pneumothorax, or  effusion. Pulmonary vascularity remains well-defined. The cardiomediastinal contours are unremarkable. No acute osseous or soft tissue abnormality. Degenerative changes are present in the imaged spine and shoulders. Telemetry leads overlie the chest. IMPRESSION: Chronically coarsened interstitial changes in the lungs with hyperinflation. No acute cardiopulmonary abnormality. Electronically Signed   By: Lovena Le M.D.   On: 09/30/2019 01:17   DG Abd 2 Views  Result Date: 09/30/2019 CLINICAL DATA:  Right lower quadrant abdominal pain EXAM: ABDOMEN - 2 VIEW COMPARISON:  07/15/2019 FINDINGS: Nonobstructive bowel gas pattern. No organomegaly or free air. No suspicious calcification. Surgical clips in the left pelvis. No acute bony abnormality. IMPRESSION: No acute findings. Electronically Signed   By: Rolm Baptise M.D.   On: 09/30/2019 08:57    Assessment/Plan 1. Loss of appetite - had been on megace--risks of dvt and lack of true healthy weight gain explained - they are agreeable to trying remeron instead b/c weight started to drop off again when off megace (however, she also was having her constipation and SBO) - mirtazapine (REMERON) 7.5 MG tablet; Take 1 tablet (7.5 mg total) by mouth at bedtime.  Dispense: 90 tablet; Refill: 1  2. Essential hypertension -bp excellent, cont current regimen as above  3. Late onset Alzheimer's disease without behavioral disturbance ([place]) -mild -cont family support MMSE - Mini Mental State Exam 10/21/2019  Orientation to time 2  Orientation to Place 2  Registration 3  Attention/ Calculation 5  Recall 2  Language- name 2 objects 2  Language- repeat 1  Language- follow 3 step command 3  Language- read & follow direction 1  Write a sentence 1  Copy design 0  Copy design-comments failed the clock  Total score 22   4. Dysphagia, oropharyngeal -suspect etiology of her dyspnea and cough -cont approaches as directed from speech therapist  5. Generalized  weakness -gradually improving, must eat adequately for this to improve fully  6. DNR (do not resuscitate) - Do not attempt resuscitation (DNR)  Labs/tests ordered:  Lab Orders  No laboratory test(s) ordered today    Next appt:  02/20/2020  Amariss Detamore L. Natassia Guthridge, D.O. [facility] [address], [place] [address] Cell Phone (Mon-Fri 8am-5pm):  [phone] On Call:  [phone] & follow prompts after 5pm & weekends Office Phone:  [phone] Office Fax:  [phone]

## 2019-10-23 ENCOUNTER — Telehealth: Payer: Self-pay

## 2019-10-23 DIAGNOSIS — I10 Essential (primary) hypertension: Secondary | ICD-10-CM | POA: Diagnosis not present

## 2019-10-23 DIAGNOSIS — E440 Moderate protein-calorie malnutrition: Secondary | ICD-10-CM | POA: Diagnosis not present

## 2019-10-23 DIAGNOSIS — E7800 Pure hypercholesterolemia, unspecified: Secondary | ICD-10-CM | POA: Diagnosis not present

## 2019-10-23 DIAGNOSIS — E119 Type 2 diabetes mellitus without complications: Secondary | ICD-10-CM | POA: Diagnosis not present

## 2019-10-23 DIAGNOSIS — K56609 Unspecified intestinal obstruction, unspecified as to partial versus complete obstruction: Secondary | ICD-10-CM | POA: Diagnosis not present

## 2019-10-23 DIAGNOSIS — D638 Anemia in other chronic diseases classified elsewhere: Secondary | ICD-10-CM | POA: Diagnosis not present

## 2019-10-23 DIAGNOSIS — E079 Disorder of thyroid, unspecified: Secondary | ICD-10-CM | POA: Diagnosis not present

## 2019-10-23 DIAGNOSIS — J690 Pneumonitis due to inhalation of food and vomit: Secondary | ICD-10-CM | POA: Diagnosis not present

## 2019-10-23 NOTE — Telephone Encounter (Signed)
Verbal Orders for Home Health were given for this patient "Gloria Murphy" DOB: 1936-01-23 to have Occupational Therapy once a week for 6 weeks. Verbal Order confirmed and approved by me Xylan Sheils.D/RMA.

## 2019-10-24 DIAGNOSIS — H401131 Primary open-angle glaucoma, bilateral, mild stage: Secondary | ICD-10-CM | POA: Diagnosis not present

## 2019-10-24 LAB — HM DIABETES EYE EXAM

## 2019-10-25 DIAGNOSIS — K56609 Unspecified intestinal obstruction, unspecified as to partial versus complete obstruction: Secondary | ICD-10-CM | POA: Diagnosis not present

## 2019-10-25 DIAGNOSIS — D638 Anemia in other chronic diseases classified elsewhere: Secondary | ICD-10-CM | POA: Diagnosis not present

## 2019-10-25 DIAGNOSIS — E119 Type 2 diabetes mellitus without complications: Secondary | ICD-10-CM | POA: Diagnosis not present

## 2019-10-25 DIAGNOSIS — E440 Moderate protein-calorie malnutrition: Secondary | ICD-10-CM | POA: Diagnosis not present

## 2019-10-25 DIAGNOSIS — E079 Disorder of thyroid, unspecified: Secondary | ICD-10-CM | POA: Diagnosis not present

## 2019-10-25 DIAGNOSIS — E7800 Pure hypercholesterolemia, unspecified: Secondary | ICD-10-CM | POA: Diagnosis not present

## 2019-10-25 DIAGNOSIS — J690 Pneumonitis due to inhalation of food and vomit: Secondary | ICD-10-CM | POA: Diagnosis not present

## 2019-10-25 DIAGNOSIS — I10 Essential (primary) hypertension: Secondary | ICD-10-CM | POA: Diagnosis not present

## 2019-10-28 DIAGNOSIS — E079 Disorder of thyroid, unspecified: Secondary | ICD-10-CM | POA: Diagnosis not present

## 2019-10-28 DIAGNOSIS — E7800 Pure hypercholesterolemia, unspecified: Secondary | ICD-10-CM | POA: Diagnosis not present

## 2019-10-28 DIAGNOSIS — I10 Essential (primary) hypertension: Secondary | ICD-10-CM | POA: Diagnosis not present

## 2019-10-28 DIAGNOSIS — D638 Anemia in other chronic diseases classified elsewhere: Secondary | ICD-10-CM | POA: Diagnosis not present

## 2019-10-28 DIAGNOSIS — E119 Type 2 diabetes mellitus without complications: Secondary | ICD-10-CM | POA: Diagnosis not present

## 2019-10-28 DIAGNOSIS — K56609 Unspecified intestinal obstruction, unspecified as to partial versus complete obstruction: Secondary | ICD-10-CM | POA: Diagnosis not present

## 2019-10-28 DIAGNOSIS — J690 Pneumonitis due to inhalation of food and vomit: Secondary | ICD-10-CM | POA: Diagnosis not present

## 2019-10-28 DIAGNOSIS — E440 Moderate protein-calorie malnutrition: Secondary | ICD-10-CM | POA: Diagnosis not present

## 2019-10-29 ENCOUNTER — Encounter: Payer: Self-pay | Admitting: Internal Medicine

## 2019-10-30 ENCOUNTER — Telehealth: Payer: Self-pay | Admitting: *Deleted

## 2019-10-30 ENCOUNTER — Encounter: Payer: Self-pay | Admitting: Internal Medicine

## 2019-10-30 DIAGNOSIS — D638 Anemia in other chronic diseases classified elsewhere: Secondary | ICD-10-CM | POA: Diagnosis not present

## 2019-10-30 DIAGNOSIS — I10 Essential (primary) hypertension: Secondary | ICD-10-CM | POA: Diagnosis not present

## 2019-10-30 DIAGNOSIS — R2681 Unsteadiness on feet: Secondary | ICD-10-CM

## 2019-10-30 DIAGNOSIS — E079 Disorder of thyroid, unspecified: Secondary | ICD-10-CM | POA: Diagnosis not present

## 2019-10-30 DIAGNOSIS — E440 Moderate protein-calorie malnutrition: Secondary | ICD-10-CM | POA: Diagnosis not present

## 2019-10-30 DIAGNOSIS — J690 Pneumonitis due to inhalation of food and vomit: Secondary | ICD-10-CM | POA: Diagnosis not present

## 2019-10-30 DIAGNOSIS — K56609 Unspecified intestinal obstruction, unspecified as to partial versus complete obstruction: Secondary | ICD-10-CM | POA: Diagnosis not present

## 2019-10-30 DIAGNOSIS — E7800 Pure hypercholesterolemia, unspecified: Secondary | ICD-10-CM | POA: Diagnosis not present

## 2019-10-30 DIAGNOSIS — E119 Type 2 diabetes mellitus without complications: Secondary | ICD-10-CM | POA: Diagnosis not present

## 2019-10-30 NOTE — Telephone Encounter (Signed)
Mark with [facility] called requesting a Rx for a front wheel Walker. Stated that the one the patient has is old and needs new one.  Needs Rx faxed to [facility].  Pended Order and sent to [facility] for approval. (Dr. Mariea Clonts out of office)

## 2019-10-30 NOTE — Telephone Encounter (Signed)
Order has been signed.

## 2019-10-31 MED ORDER — CALTRATE 600+D3 600-800 MG-UNIT PO TABS
ORAL_TABLET | ORAL | 1 refills | Status: AC
Start: 2019-10-31 — End: ?

## 2019-10-31 NOTE — Telephone Encounter (Signed)
Who did you give the signed order to? Need to fax to [facility].

## 2019-10-31 NOTE — Addendum Note (Signed)
Addended by: Lauree Chandler on: 10/31/2019 01:24 PM   Modules accepted: Orders

## 2019-11-03 DIAGNOSIS — C7A00 Malignant carcinoid tumor of unspecified site: Secondary | ICD-10-CM | POA: Diagnosis not present

## 2019-11-03 DIAGNOSIS — Z48815 Encounter for surgical aftercare following surgery on the digestive system: Secondary | ICD-10-CM | POA: Diagnosis not present

## 2019-11-03 DIAGNOSIS — K56609 Unspecified intestinal obstruction, unspecified as to partial versus complete obstruction: Secondary | ICD-10-CM | POA: Diagnosis not present

## 2019-11-03 DIAGNOSIS — R2689 Other abnormalities of gait and mobility: Secondary | ICD-10-CM | POA: Diagnosis not present

## 2019-11-03 DIAGNOSIS — G9341 Metabolic encephalopathy: Secondary | ICD-10-CM | POA: Diagnosis not present

## 2019-11-05 DIAGNOSIS — E440 Moderate protein-calorie malnutrition: Secondary | ICD-10-CM | POA: Diagnosis not present

## 2019-11-05 DIAGNOSIS — J690 Pneumonitis due to inhalation of food and vomit: Secondary | ICD-10-CM | POA: Diagnosis not present

## 2019-11-05 DIAGNOSIS — D638 Anemia in other chronic diseases classified elsewhere: Secondary | ICD-10-CM | POA: Diagnosis not present

## 2019-11-05 DIAGNOSIS — I10 Essential (primary) hypertension: Secondary | ICD-10-CM | POA: Diagnosis not present

## 2019-11-05 DIAGNOSIS — E079 Disorder of thyroid, unspecified: Secondary | ICD-10-CM | POA: Diagnosis not present

## 2019-11-05 DIAGNOSIS — E119 Type 2 diabetes mellitus without complications: Secondary | ICD-10-CM | POA: Diagnosis not present

## 2019-11-05 DIAGNOSIS — K56609 Unspecified intestinal obstruction, unspecified as to partial versus complete obstruction: Secondary | ICD-10-CM | POA: Diagnosis not present

## 2019-11-05 DIAGNOSIS — E7800 Pure hypercholesterolemia, unspecified: Secondary | ICD-10-CM | POA: Diagnosis not present

## 2019-11-06 DIAGNOSIS — E119 Type 2 diabetes mellitus without complications: Secondary | ICD-10-CM | POA: Diagnosis not present

## 2019-11-06 DIAGNOSIS — K56609 Unspecified intestinal obstruction, unspecified as to partial versus complete obstruction: Secondary | ICD-10-CM | POA: Diagnosis not present

## 2019-11-06 DIAGNOSIS — E440 Moderate protein-calorie malnutrition: Secondary | ICD-10-CM | POA: Diagnosis not present

## 2019-11-06 DIAGNOSIS — J690 Pneumonitis due to inhalation of food and vomit: Secondary | ICD-10-CM | POA: Diagnosis not present

## 2019-11-06 DIAGNOSIS — I10 Essential (primary) hypertension: Secondary | ICD-10-CM | POA: Diagnosis not present

## 2019-11-06 DIAGNOSIS — D638 Anemia in other chronic diseases classified elsewhere: Secondary | ICD-10-CM | POA: Diagnosis not present

## 2019-11-06 DIAGNOSIS — E7800 Pure hypercholesterolemia, unspecified: Secondary | ICD-10-CM | POA: Diagnosis not present

## 2019-11-06 DIAGNOSIS — E079 Disorder of thyroid, unspecified: Secondary | ICD-10-CM | POA: Diagnosis not present

## 2019-11-07 ENCOUNTER — Ambulatory Visit (INDEPENDENT_AMBULATORY_CARE_PROVIDER_SITE_OTHER): Payer: Medicare Other | Admitting: Internal Medicine

## 2019-11-07 ENCOUNTER — Encounter: Payer: Self-pay | Admitting: Internal Medicine

## 2019-11-07 ENCOUNTER — Other Ambulatory Visit: Payer: Self-pay

## 2019-11-07 VITALS — BP 112/62 | HR 88 | Temp 97.8°F | Ht 69.0 in | Wt 147.6 lb

## 2019-11-07 DIAGNOSIS — R0600 Dyspnea, unspecified: Secondary | ICD-10-CM

## 2019-11-07 DIAGNOSIS — E7800 Pure hypercholesterolemia, unspecified: Secondary | ICD-10-CM | POA: Diagnosis not present

## 2019-11-07 DIAGNOSIS — E119 Type 2 diabetes mellitus without complications: Secondary | ICD-10-CM | POA: Diagnosis not present

## 2019-11-07 DIAGNOSIS — R0609 Other forms of dyspnea: Secondary | ICD-10-CM

## 2019-11-07 DIAGNOSIS — K56609 Unspecified intestinal obstruction, unspecified as to partial versus complete obstruction: Secondary | ICD-10-CM | POA: Diagnosis not present

## 2019-11-07 DIAGNOSIS — D638 Anemia in other chronic diseases classified elsewhere: Secondary | ICD-10-CM | POA: Diagnosis not present

## 2019-11-07 DIAGNOSIS — R05 Cough: Secondary | ICD-10-CM | POA: Diagnosis not present

## 2019-11-07 DIAGNOSIS — E079 Disorder of thyroid, unspecified: Secondary | ICD-10-CM | POA: Diagnosis not present

## 2019-11-07 DIAGNOSIS — R053 Chronic cough: Secondary | ICD-10-CM

## 2019-11-07 DIAGNOSIS — E440 Moderate protein-calorie malnutrition: Secondary | ICD-10-CM | POA: Diagnosis not present

## 2019-11-07 DIAGNOSIS — J690 Pneumonitis due to inhalation of food and vomit: Secondary | ICD-10-CM | POA: Diagnosis not present

## 2019-11-07 DIAGNOSIS — I10 Essential (primary) hypertension: Secondary | ICD-10-CM | POA: Diagnosis not present

## 2019-11-07 LAB — BASIC METABOLIC PANEL
BUN: 14 mg/dL (ref 6–23)
CO2: 27 mEq/L (ref 19–32)
Calcium: 9.6 mg/dL (ref 8.4–10.5)
Chloride: 98 mEq/L (ref 96–112)
Creatinine, Ser: 0.83 mg/dL (ref 0.40–1.20)
GFR: 79.66 mL/min (ref >60.00)
Glucose, Bld: 101 mg/dL — ABNORMAL HIGH (ref 70–99)
Potassium: 4 mEq/L (ref 3.5–5.1)
Sodium: 132 mEq/L — ABNORMAL LOW (ref 135–145)

## 2019-11-07 LAB — CBC WITH DIFFERENTIAL/PLATELET
Basophils Absolute: 0.1 10*3/uL (ref 0.0–0.1)
Basophils Relative: 1.3 % (ref 0.0–3.0)
Eosinophils Absolute: 0.1 10*3/uL (ref 0.0–0.7)
Eosinophils Relative: 2.1 % (ref 0.0–5.0)
HCT: 29 % — ABNORMAL LOW (ref 36.0–46.0)
Hemoglobin: 9.8 g/dL — ABNORMAL LOW (ref 12.0–15.0)
Lymphocytes Relative: 14.6 % (ref 12.0–46.0)
Lymphs Abs: 0.9 10*3/uL (ref 0.7–4.0)
MCHC: 33.8 g/dL (ref 30.0–36.0)
MCV: 76.3 fl — ABNORMAL LOW (ref 78.0–100.0)
Monocytes Absolute: 1 10*3/uL (ref 0.1–1.0)
Monocytes Relative: 16.7 % — ABNORMAL HIGH (ref 3.0–12.0)
Neutro Abs: 3.8 10*3/uL (ref 1.4–7.7)
Neutrophils Relative %: 65.3 % (ref 43.0–77.0)
Platelets: 294 10*3/uL (ref 150.0–400.0)
RBC: 3.81 Mil/uL — ABNORMAL LOW (ref 3.87–5.11)
RDW: 17.3 % — ABNORMAL HIGH (ref 11.5–15.5)
WBC: 5.8 10*3/uL (ref 4.0–10.5)

## 2019-11-07 LAB — SEDIMENTATION RATE: Sed Rate: 124 mm/hr — ABNORMAL HIGH (ref 0–30)

## 2019-11-07 NOTE — Progress Notes (Signed)
OV 11/07/2019  Subjective:  Patient ID: Gloria Murphy, female , DOB: 05-Mar-1936 , age 84 y.o. , MRN: [id number] , ADDRESS: [place] [address] PCP Gayland Curry, DO   11/07/2019 -   Chief Complaint  Patient presents with   Consult    productive cough (white colored sputum), shortness of breath with exertion   History is gained from talking to the patient her husband and reviewed the chart.  HPI Gloria Murphy 84 y.o. -is a fatigued deconditioned lady whose CODE STATUS appears to be DNR.  She is accompanied by her husband.  History dates back to mid February 2021 when she was admitted for status post small bowel obstruction status post adhesiolysis July 01, 2019.  She had malignant carcinoid tumor to the left adnexa.  Course was complicated by acute kidney injury [as of in May 2021 she had no kidney injury and a hemoglobin of 11.1 g%.].  Sometime after that according to the husband dyspnea started [facility] in October 2020 she had a right heart catheterization that was normal by Dr. Virgina Jock and that was for dyspnea].  The dyspnea is somewhat been stable.  Then in early May 2021 she developed cough and by end of May 2020 when she was admitted for pneumonia not otherwise specified.  I do not see any CT scan imaging during this entire time in 2021.  This is particularly of the chest.  She was treated with oxygen antibiotics per chart review and then discharge.  At discharge she did not require any oxygen.  However she says the cough persists and the shortness of breath persist.  Cough is the more dominant symptoms.  Review of the blood tests reveals possible high use no.  At the end of May 2021 at the time of admission.  Cough is described as congestion unclear what color of the sputum.  The cough is significant.  Unclear if this orthopnea proximal nocturnal dyspnea.  I did review CT scan of the abdomen from February 2021 with associated lung images.  There is  possible infiltrates suggestive of ILD in the lung base   Simple office walk 185 feet x  3 laps goal with forehead probe 11/07/2019   O2 used ra  Number laps completed Did only 2 laps  Comments about pace Slow pace, 2 rest perioids, felt dyspneic  Resting Pulse Ox/HR 100% and 89/min  Final Pulse Ox/HR 100% and 100/min  Desaturated </= 88% no  Desaturated <= 3% points no  Got Tachycardic >/= 90/min yes  Symptoms at end of test Dyspnea, stopped  Miscellaneous comments x   Results for Gloria Murphy, Gloria Murphy (MRN [id number]) as of 11/07/2019 14:17  Ref. Range 07/22/2019 03:30 09/30/2019 00:25 09/30/2019 04:35 10/02/2019 02:12 10/03/2019 15:39  Eosinophils Absolute Latest Ref Range: 15 - 500 cells/uL 0.2 0.1 0.1 0.1 148     ROS - per HPI     has a past medical history of Diabetes mellitus without complication ([place]), High cholesterol, Hypertension, and Thyroid disease.   reports that she has never smoked. She has never used smokeless tobacco.  Past Surgical History:  Procedure Laterality Date   ABDOMINAL HYSTERECTOMY     EYE SURGERY Bilateral 06/2016   Cataract   KNEE SURGERY Right    LAPAROTOMY N/A 08/31/2017   Procedure: EXPLORATORY LAPAROTOMY; BILATERAL SALPINGOOPHERECTOMY; OMENTECTOMY, TUMOR DEBULKING;  Surgeon: Everitt Amber, MD;  [facility];  Service: Gynecology;  Laterality: N/A;   LAPAROTOMY N/A  07/01/2019   Procedure: EXPLORATORY LAPAROTOMY;  Surgeon: Jovita Kussmaul, MD;  Location: [facility];  Service: General;  Laterality: N/A;   MASS EXCISION N/A 08/31/2017   Procedure: RESECTION OF PELVIC MASS;  Surgeon: Everitt Amber, MD;  [facility];  Service: Gynecology;  Laterality: N/A;   RIGHT HEART CATH N/A 02/11/2019   Procedure: RIGHT HEART CATH;  Surgeon: Nigel Mormon, MD;  Location: [facility];  Service: Cardiovascular;  Laterality: N/A;   SMALL INTESTINE SURGERY      No Known Allergies  Immunization History  Administered Date(s) Administered   Influenza,  High Dose Seasonal PF 03/07/2018, 02/02/2019   PFIZER SARS-COV-2 Vaccination 06/17/2019, 08/13/2019    Family History  Problem Relation Age of Onset   Heart disease Mother    Diabetes Mother    Kidney failure Father    Breast cancer Neg Hx      Current Outpatient Medications:    amLODipine (NORVASC) 5 MG tablet, Take 5 mg by mouth daily., Disp: , Rfl:    aspirin EC 81 MG tablet, Take 1 tablet (81 mg total) by mouth daily., Disp: 90 tablet, Rfl: 3   Calcium Carb-Cholecalciferol (CALTRATE 600+D3) 600-800 MG-UNIT TABS, Take one tablet by mouth once daily., Disp: 90 tablet, Rfl: 1   carboxymethylcellulose (REFRESH TEARS) 0.5 % SOLN, Place 1 drop into both eyes 3 (three) times daily., Disp: , Rfl:    carvedilol (COREG) 6.25 MG tablet, TAKE 1 TABLET(6.25 MG) BY MOUTH TWICE DAILY WITH A MEAL, Disp: 180 tablet, Rfl: 0   chlorhexidine (PERIDEX) 0.12 % solution, Use as directed 15 mLs in the mouth or throat daily., Disp: , Rfl:    famotidine (PEPCID) 20 MG tablet, Take 20 mg by mouth as needed. , Disp: , Rfl:    furosemide (LASIX) 20 MG tablet, Take 20 mg by mouth. Take 1/2 pill a day 31m, Disp: , Rfl:    guaiFENesin (ROBITUSSIN) 100 MG/5ML SOLN, Take 5 mLs (100 mg total) by mouth every 4 (four) hours as needed for cough or to loosen phlegm., Disp: 236 mL, Rfl: 0   MAGNESIUM OXIDE PO, Take 1 tablet by mouth daily. , Disp: , Rfl:    mirtazapine (REMERON) 7.5 MG tablet, Take 1 tablet (7.5 mg total) by mouth at bedtime., Disp: 90 tablet, Rfl: 1   polyethylene glycol (MIRALAX / GLYCOLAX) 17 g packet, Take 17 g by mouth as needed., Disp: , Rfl:    potassium chloride SA (KLOR-CON) 20 MEQ tablet, Take 1 tablet (20 mEq total) by mouth daily., Disp: 30 tablet, Rfl: 3      Objective:   Vitals:   11/07/19 1359  BP: 112/62  Pulse: 88  Temp: 97.8 F (36.6 C)  TempSrc: Oral  SpO2: 97%  Weight: 147 lb 9.6 oz (67 kg)  Height: _0  (1.753 m)    Estimated body mass index is 21.8  kg/m as calculated from the following:   Height as of this encounter: _1  (1.753 m).   Weight as of this encounter: 147 lb 9.6 oz (67 kg).  _2 @  Filed Weights   11/07/19 1359  Weight: 147 lb 9.6 oz (67 kg)     Physical Exam Is a frail cachectic female.  She has a walker.  She is able to give good history but very slow.  There are some scattered crackles in the lung particular in the upper lobe and lower lobe.  Normal heart sounds but there might be a murmur.  Abdomen  is soft she has a surgical scar.  No sinus no clubbing no edema.        Assessment:       ICD-10-CM   1. Exertional dyspnea  R06.00 Pulmonary function test  2. Chronic cough  R05    There is a the cough variant asthma or ILD or just physical deconditioning or diastolic dysfunction.    Plan:     Patient Instructions     ICD-10-CM   1. Exertional dyspnea  R06.00   2. Chronic cough  R05    Do HRCT supine and prone Do blood IgE, cbc with diff, chemistry . ANA, ESR, ACE, RF, CCP, ssa, ssb, and scl-70 11/07/2019  Do ONO test on room air  Do spirometry and dlco Do feno breathing test  Do echocardiogram  Followup  - next few weeks with app to review above - prefer face to face     SIGNATURE    Dr. Brand Males, M.D., F.C.C.P,  Pulmonary and Critical Care Medicine Staff Physician, [place] Director - Interstitial Lung Disease  Program  Pulmonary [place] [facility] [place], [place], [address]  Pager: [phone], If no answer or between  15:00h - 7:00h: call [phone] Telephone: [phone]  2:39 PM 11/07/2019

## 2019-11-07 NOTE — Patient Instructions (Addendum)
ICD-10-CM   1. Exertional dyspnea  R06.00   2. Chronic cough  R05    Do HRCT supine and prone Do blood IgE, cbc with diff, chemistry . ANA, ESR, ACE, RF, CCP, ssa, ssb, and scl-70 11/07/2019  Do ONO test on room air  Do spirometry and dlco Do feno breathing test  Do echocardiogram  Followup  - next few weeks with app to review above - prefer face to face

## 2019-11-08 LAB — RHEUMATOID FACTOR: Rheumatoid fact SerPl-aCnc: <14 IU/mL (ref <14)

## 2019-11-12 ENCOUNTER — Other Ambulatory Visit: Payer: Medicare Other | Admitting: Nurse Practitioner

## 2019-11-12 ENCOUNTER — Encounter: Payer: Self-pay | Admitting: Nurse Practitioner

## 2019-11-12 ENCOUNTER — Other Ambulatory Visit: Payer: Self-pay

## 2019-11-12 DIAGNOSIS — E46 Unspecified protein-calorie malnutrition: Secondary | ICD-10-CM | POA: Diagnosis not present

## 2019-11-12 DIAGNOSIS — Z515 Encounter for palliative care: Secondary | ICD-10-CM

## 2019-11-12 LAB — IGE: IgE (Immunoglobulin E), Serum: 8 kU/L (ref <=114)

## 2019-11-12 LAB — CYCLIC CITRUL PEPTIDE ANTIBODY, IGG: Cyclic Citrullin Peptide Ab: <16 UNITS

## 2019-11-12 LAB — ANGIOTENSIN CONVERTING ENZYME: Angiotensin-Converting Enzyme: 45 U/L (ref 9–67)

## 2019-11-12 LAB — ANTI-NUCLEAR AB-TITER (ANA TITER): ANA Titer 1: 1:40 {titer} — ABNORMAL HIGH

## 2019-11-12 LAB — ANTI-SCLERODERMA ANTIBODY: Scleroderma (Scl-70) (ENA) Antibody, IgG: <1 AI

## 2019-11-12 LAB — SJOGREN'S SYNDROME ANTIBODS(SSA + SSB)
SSA (Ro) (ENA) Antibody, IgG: <1 AI
SSB (La) (ENA) Antibody, IgG: <1 AI

## 2019-11-12 LAB — ANA: Anti Nuclear Antibody (ANA): POSITIVE — AB

## 2019-11-12 NOTE — Progress Notes (Signed)
[facility] Palliative Care Consult Note Telephone: [phone]  Fax: [phone]  PATIENT NAME: Gloria Murphy DOB: Jan 20, 1936 MRN: [id number]  PRIMARY CARE PROVIDER:   Gayland Curry, DO  REFERRING PROVIDER:  Gayland Curry, DO [place],  [place] [address]  RESPONSIBLE PARTY:   Self; husband Jakerra Floyd [phone]; [phone]  I was asked to see Gloria. Gloria Murphy for Palliative care consult by Dr Mariea Clonts for Medical goals of care  RECOMMENDATIONS and PLAN:  1. ACP: DNR, MOST form completed placed in Vynca; wishes are to treat what is treatable. No intubation/no ventilator/no feeding tube; wishes are for hospitalization if necessary, IVF/antibiotics   2. Palliative care encounter; Palliative medicine team will continue to support patient, patient's family, and medical team. Visit consisted of counseling and education dealing with the complex and emotionally intense issues of symptom management and palliative care in the setting of serious and potentially life-threatening illness  I spent 90 minutes providing this consultation,  from 1:30pm to 3:00pm. More than 50% of the time in this consultation was spent coordinating communication.   HISTORY OF PRESENT ILLNESS:  Gloria Murphy is a 84 y.o. year old female with multiple medical problems including Malignant carcinoid tumor of left adnexa s/p exploratory laparoscopy with bilateral salpingo ophorectomy,  omentectomy, and radical debunking for suspected ovarian cancer 08/2017, anemia, TIA, diabetes, thyroid disease, hypertension, resection of pelvic mass, small bowel obstruction s/p adhesiolysis 07/01/2019, severe protein malnutrition, laparoscopy, right knee surgery, bilateral cataracts, abdominal hysterectomy.   In- person initial palliative care visit. We talked about purpose of palliative care. Gloria and Gloria Pizzini both in agreement. We talked about past medical history. We talked about the last time this Manske  was independent, driving which was prior to hospitalization. We talked about back-to-back hospitalizations. We talked about small bowel obstruction. We talked about how she has been feeling. Gloria Murphy endorse is that she is doing better but continues to be very tired. We talked about symptoms of pain which she does not exhibit. We talked about shortness of breath what she denies. We talked about her appetite which continues to be very poor. We talked about how much she eats at a meal. Gloria Murphy endorses that Gloria Dexheimer made her eat two boiled eggs today. We talked about the weight loss she's sustained about 50 lb since the initial hospitalization. Gloria Bolser endorses as she weighed over 200 pounds and now she is about 150. We talked about food said she likes. We talked about smaller proportions more frequent meals. We talked about supplement. We talked about the work she is been doing with physical therapy. Gloria Murphy does walk with her walker and is able to get yourself dressed. We talked about her fatigue and weakness which is slowly improving. Gloria Murphy endorses she will have two good days and then one day that's really hard where she is exhausted. We talked about having back-to-back catastrophic events meaning hospitalizations and where she is exhausted. We talked about having back-to-back catastrophic events  meaning hospitalization and  what she went through to be patient with herself in her body. It takes some time to heal. We talked about realistic expectations. We talked about life review. Gloria Murphy endorse is that she worked at [employer]. We talked about Gloria and Gloria Vandalen as they have been together 5 years and married the last three. They have children from separate marriages were both of their spouses had passed on.  We talked about things that they like to do going out to [place] places to listen to music and dance. They talked about festivals they've gone to.  We talked about  medical goals of care and their visit with the palliative provider during hospitalization. Gloria Murphy endorse is that she would not want CPR nor to be intubated but I most form was not completed. There is not a DNR form in the home. We reviewed most form including her wishes not to have to feedings or a feeding tube placed. Gloria Murphy wishes not to be placed on the ventilator or intubated. We talked about CPR and scenarios. Gloria Murphy endorse is she wants to be a do not resuscitate. Gloria and Gloria Murphy in agreement to have a disability [place] form completed placed vynca. I also completed MOST form with wishes for Limited treatment, no intubation. No feeding tube. Wishes are for antibiotics and IV fluids. most form completed, reviewed with Gloria Murphy. Gloria Murphy's signed, placed in [place]. We told about role of palliative care and plan of care. Discuss will follow up in 6 weeks if needed or sooner should she declined. Gloria Murphy in agreement. Appointment scheduled. Therapeutic, emotional support provided. Contact information. Questions answered satisfaction.  Palliative Care was asked to help to continue to address goals of care.   CODE STATUS: DNR  PPS: 50% HOSPICE ELIGIBILITY/DIAGNOSIS: TBD  PAST MEDICAL HISTORY:  Past Medical History:  Diagnosis Date  . Diabetes mellitus without complication ([place])   . High cholesterol   . Hypertension   . Thyroid disease     SOCIAL HX:  Social History   Tobacco Use  . Smoking status: Never Smoker  . Smokeless tobacco: Never Used  Substance Use Topics  . Alcohol use: No    ALLERGIES: No Known Allergies   PERTINENT MEDICATIONS:  Outpatient Encounter Medications as of 11/12/2019  Medication Sig  . amLODipine (NORVASC) 5 MG tablet Take 5 mg by mouth daily.  [name] aspirin EC 81 MG tablet Take 1 tablet (81 mg total) by mouth daily.  . Calcium Carb-Cholecalciferol (CALTRATE 600+D3) 600-800 MG-UNIT TABS Take one tablet by mouth once daily.  .  carboxymethylcellulose (REFRESH TEARS) 0.5 % SOLN Place 1 drop into both eyes 3 (three) times daily.  . carvedilol (COREG) 6.25 MG tablet TAKE 1 TABLET(6.25 MG) BY MOUTH TWICE DAILY WITH A MEAL  . chlorhexidine (PERIDEX) 0.12 % solution Use as directed 15 mLs in the mouth or throat daily.  . famotidine (PEPCID) 20 MG tablet Take 20 mg by mouth as needed.   . furosemide (LASIX) 20 MG tablet Take 20 mg by mouth. Take 1/2 pill a day 10mg   . guaiFENesin (ROBITUSSIN) 100 MG/5ML SOLN Take 5 mLs (100 mg total) by mouth every 4 (four) hours as needed for cough or to loosen phlegm.  [name] MAGNESIUM OXIDE PO Take 1 tablet by mouth daily.   . mirtazapine (REMERON) 7.5 MG tablet Take 1 tablet (7.5 mg total) by mouth at bedtime.  . polyethylene glycol (MIRALAX / GLYCOLAX) 17 g packet Take 17 g by mouth as needed.  . potassium chloride SA (KLOR-CON) 20 MEQ tablet Take 1 tablet (20 mEq total) by mouth daily.   No facility-administered encounter medications on file as of 11/12/2019.    PHYSICAL EXAM:   General: NAD, frail appearing, thin, pleasant female Cardiovascular: regular rate and rhythm Pulmonary: clear ant fields Neurological: Walks with walker  Yates Weisgerber Ihor Gully, NP

## 2019-11-13 DIAGNOSIS — J690 Pneumonitis due to inhalation of food and vomit: Secondary | ICD-10-CM | POA: Diagnosis not present

## 2019-11-13 DIAGNOSIS — I10 Essential (primary) hypertension: Secondary | ICD-10-CM | POA: Diagnosis not present

## 2019-11-13 DIAGNOSIS — E119 Type 2 diabetes mellitus without complications: Secondary | ICD-10-CM | POA: Diagnosis not present

## 2019-11-13 DIAGNOSIS — E079 Disorder of thyroid, unspecified: Secondary | ICD-10-CM | POA: Diagnosis not present

## 2019-11-13 DIAGNOSIS — D638 Anemia in other chronic diseases classified elsewhere: Secondary | ICD-10-CM | POA: Diagnosis not present

## 2019-11-13 DIAGNOSIS — K56609 Unspecified intestinal obstruction, unspecified as to partial versus complete obstruction: Secondary | ICD-10-CM | POA: Diagnosis not present

## 2019-11-13 DIAGNOSIS — E7800 Pure hypercholesterolemia, unspecified: Secondary | ICD-10-CM | POA: Diagnosis not present

## 2019-11-13 DIAGNOSIS — E440 Moderate protein-calorie malnutrition: Secondary | ICD-10-CM | POA: Diagnosis not present

## 2019-11-14 ENCOUNTER — Other Ambulatory Visit: Payer: Self-pay

## 2019-11-14 ENCOUNTER — Ambulatory Visit (HOSPITAL_COMMUNITY): Payer: Medicare Other | Attending: Cardiology

## 2019-11-14 ENCOUNTER — Ambulatory Visit (INDEPENDENT_AMBULATORY_CARE_PROVIDER_SITE_OTHER)
Admission: RE | Admit: 2019-11-14 | Discharge: 2019-11-14 | Disposition: A | Discharge status: Home / Self Care | Payer: Medicare Other | Source: Ambulatory Visit | Attending: Internal Medicine | Admitting: Internal Medicine

## 2019-11-14 DIAGNOSIS — E440 Moderate protein-calorie malnutrition: Secondary | ICD-10-CM | POA: Diagnosis not present

## 2019-11-14 DIAGNOSIS — J690 Pneumonitis due to inhalation of food and vomit: Secondary | ICD-10-CM | POA: Diagnosis not present

## 2019-11-14 DIAGNOSIS — R0609 Other forms of dyspnea: Secondary | ICD-10-CM

## 2019-11-14 DIAGNOSIS — I10 Essential (primary) hypertension: Secondary | ICD-10-CM | POA: Diagnosis not present

## 2019-11-14 DIAGNOSIS — D638 Anemia in other chronic diseases classified elsewhere: Secondary | ICD-10-CM | POA: Diagnosis not present

## 2019-11-14 DIAGNOSIS — R0600 Dyspnea, unspecified: Secondary | ICD-10-CM | POA: Diagnosis not present

## 2019-11-14 DIAGNOSIS — R05 Cough: Secondary | ICD-10-CM

## 2019-11-14 DIAGNOSIS — R053 Chronic cough: Secondary | ICD-10-CM

## 2019-11-14 DIAGNOSIS — R0602 Shortness of breath: Secondary | ICD-10-CM | POA: Diagnosis not present

## 2019-11-14 DIAGNOSIS — E7800 Pure hypercholesterolemia, unspecified: Secondary | ICD-10-CM | POA: Diagnosis not present

## 2019-11-14 DIAGNOSIS — K56609 Unspecified intestinal obstruction, unspecified as to partial versus complete obstruction: Secondary | ICD-10-CM | POA: Diagnosis not present

## 2019-11-14 DIAGNOSIS — E079 Disorder of thyroid, unspecified: Secondary | ICD-10-CM | POA: Diagnosis not present

## 2019-11-14 DIAGNOSIS — E119 Type 2 diabetes mellitus without complications: Secondary | ICD-10-CM | POA: Diagnosis not present

## 2019-11-15 DIAGNOSIS — E440 Moderate protein-calorie malnutrition: Secondary | ICD-10-CM | POA: Diagnosis not present

## 2019-11-15 DIAGNOSIS — E079 Disorder of thyroid, unspecified: Secondary | ICD-10-CM | POA: Diagnosis not present

## 2019-11-15 DIAGNOSIS — E7800 Pure hypercholesterolemia, unspecified: Secondary | ICD-10-CM | POA: Diagnosis not present

## 2019-11-15 DIAGNOSIS — I10 Essential (primary) hypertension: Secondary | ICD-10-CM | POA: Diagnosis not present

## 2019-11-15 DIAGNOSIS — D638 Anemia in other chronic diseases classified elsewhere: Secondary | ICD-10-CM | POA: Diagnosis not present

## 2019-11-15 DIAGNOSIS — K56609 Unspecified intestinal obstruction, unspecified as to partial versus complete obstruction: Secondary | ICD-10-CM | POA: Diagnosis not present

## 2019-11-15 DIAGNOSIS — J690 Pneumonitis due to inhalation of food and vomit: Secondary | ICD-10-CM | POA: Diagnosis not present

## 2019-11-15 DIAGNOSIS — E119 Type 2 diabetes mellitus without complications: Secondary | ICD-10-CM | POA: Diagnosis not present

## 2019-11-19 DIAGNOSIS — I10 Essential (primary) hypertension: Secondary | ICD-10-CM | POA: Diagnosis not present

## 2019-11-19 DIAGNOSIS — D638 Anemia in other chronic diseases classified elsewhere: Secondary | ICD-10-CM | POA: Diagnosis not present

## 2019-11-19 DIAGNOSIS — E440 Moderate protein-calorie malnutrition: Secondary | ICD-10-CM | POA: Diagnosis not present

## 2019-11-19 DIAGNOSIS — J690 Pneumonitis due to inhalation of food and vomit: Secondary | ICD-10-CM | POA: Diagnosis not present

## 2019-11-19 DIAGNOSIS — E7800 Pure hypercholesterolemia, unspecified: Secondary | ICD-10-CM | POA: Diagnosis not present

## 2019-11-19 DIAGNOSIS — K56609 Unspecified intestinal obstruction, unspecified as to partial versus complete obstruction: Secondary | ICD-10-CM | POA: Diagnosis not present

## 2019-11-19 DIAGNOSIS — E079 Disorder of thyroid, unspecified: Secondary | ICD-10-CM | POA: Diagnosis not present

## 2019-11-19 DIAGNOSIS — E119 Type 2 diabetes mellitus without complications: Secondary | ICD-10-CM | POA: Diagnosis not present

## 2019-11-20 ENCOUNTER — Encounter: Payer: Self-pay | Admitting: Internal Medicine

## 2019-11-20 DIAGNOSIS — K56609 Unspecified intestinal obstruction, unspecified as to partial versus complete obstruction: Secondary | ICD-10-CM | POA: Diagnosis not present

## 2019-11-20 DIAGNOSIS — J449 Chronic obstructive pulmonary disease, unspecified: Secondary | ICD-10-CM | POA: Diagnosis not present

## 2019-11-20 DIAGNOSIS — E079 Disorder of thyroid, unspecified: Secondary | ICD-10-CM | POA: Diagnosis not present

## 2019-11-20 DIAGNOSIS — I10 Essential (primary) hypertension: Secondary | ICD-10-CM | POA: Diagnosis not present

## 2019-11-20 DIAGNOSIS — E440 Moderate protein-calorie malnutrition: Secondary | ICD-10-CM | POA: Diagnosis not present

## 2019-11-20 DIAGNOSIS — J690 Pneumonitis due to inhalation of food and vomit: Secondary | ICD-10-CM | POA: Diagnosis not present

## 2019-11-20 DIAGNOSIS — R0902 Hypoxemia: Secondary | ICD-10-CM | POA: Diagnosis not present

## 2019-11-20 DIAGNOSIS — D638 Anemia in other chronic diseases classified elsewhere: Secondary | ICD-10-CM | POA: Diagnosis not present

## 2019-11-20 DIAGNOSIS — E7800 Pure hypercholesterolemia, unspecified: Secondary | ICD-10-CM | POA: Diagnosis not present

## 2019-11-20 DIAGNOSIS — E119 Type 2 diabetes mellitus without complications: Secondary | ICD-10-CM | POA: Diagnosis not present

## 2019-11-25 DIAGNOSIS — D638 Anemia in other chronic diseases classified elsewhere: Secondary | ICD-10-CM | POA: Diagnosis not present

## 2019-11-25 DIAGNOSIS — E079 Disorder of thyroid, unspecified: Secondary | ICD-10-CM | POA: Diagnosis not present

## 2019-11-25 DIAGNOSIS — E119 Type 2 diabetes mellitus without complications: Secondary | ICD-10-CM | POA: Diagnosis not present

## 2019-11-25 DIAGNOSIS — K56609 Unspecified intestinal obstruction, unspecified as to partial versus complete obstruction: Secondary | ICD-10-CM | POA: Diagnosis not present

## 2019-11-25 DIAGNOSIS — E7800 Pure hypercholesterolemia, unspecified: Secondary | ICD-10-CM | POA: Diagnosis not present

## 2019-11-25 DIAGNOSIS — I10 Essential (primary) hypertension: Secondary | ICD-10-CM | POA: Diagnosis not present

## 2019-11-25 DIAGNOSIS — J690 Pneumonitis due to inhalation of food and vomit: Secondary | ICD-10-CM | POA: Diagnosis not present

## 2019-11-25 DIAGNOSIS — E440 Moderate protein-calorie malnutrition: Secondary | ICD-10-CM | POA: Diagnosis not present

## 2019-11-26 ENCOUNTER — Telehealth: Payer: Self-pay | Admitting: Primary Care

## 2019-11-26 NOTE — Telephone Encounter (Signed)
Patient's husband contacted, office visit changed to TELEVISIT per patient request.

## 2019-11-27 ENCOUNTER — Ambulatory Visit (INDEPENDENT_AMBULATORY_CARE_PROVIDER_SITE_OTHER): Payer: PRIVATE HEALTH INSURANCE | Admitting: Internal Medicine

## 2019-11-27 ENCOUNTER — Other Ambulatory Visit: Payer: Self-pay

## 2019-11-27 DIAGNOSIS — D638 Anemia in other chronic diseases classified elsewhere: Secondary | ICD-10-CM | POA: Diagnosis not present

## 2019-11-27 DIAGNOSIS — I10 Essential (primary) hypertension: Secondary | ICD-10-CM | POA: Diagnosis not present

## 2019-11-27 DIAGNOSIS — R0609 Other forms of dyspnea: Secondary | ICD-10-CM

## 2019-11-27 DIAGNOSIS — E440 Moderate protein-calorie malnutrition: Secondary | ICD-10-CM | POA: Diagnosis not present

## 2019-11-27 DIAGNOSIS — E079 Disorder of thyroid, unspecified: Secondary | ICD-10-CM | POA: Diagnosis not present

## 2019-11-27 DIAGNOSIS — J690 Pneumonitis due to inhalation of food and vomit: Secondary | ICD-10-CM | POA: Diagnosis not present

## 2019-11-27 DIAGNOSIS — E7800 Pure hypercholesterolemia, unspecified: Secondary | ICD-10-CM | POA: Diagnosis not present

## 2019-11-27 DIAGNOSIS — K56609 Unspecified intestinal obstruction, unspecified as to partial versus complete obstruction: Secondary | ICD-10-CM | POA: Diagnosis not present

## 2019-11-27 DIAGNOSIS — R0600 Dyspnea, unspecified: Secondary | ICD-10-CM

## 2019-11-27 DIAGNOSIS — E119 Type 2 diabetes mellitus without complications: Secondary | ICD-10-CM | POA: Diagnosis not present

## 2019-11-27 NOTE — Progress Notes (Signed)
Spirometry/DLCO and Feno attempted. Patient could not comprehend directions needed to perform each test.  Patient's husband present during all test.

## 2019-11-29 ENCOUNTER — Ambulatory Visit (INDEPENDENT_AMBULATORY_CARE_PROVIDER_SITE_OTHER): Payer: Medicare Other | Admitting: Primary Care

## 2019-11-29 ENCOUNTER — Telehealth: Payer: Self-pay | Admitting: *Deleted

## 2019-11-29 ENCOUNTER — Encounter: Payer: Self-pay | Admitting: Primary Care

## 2019-11-29 ENCOUNTER — Other Ambulatory Visit: Payer: PRIVATE HEALTH INSURANCE

## 2019-11-29 ENCOUNTER — Other Ambulatory Visit: Payer: Self-pay

## 2019-11-29 DIAGNOSIS — I5189 Other ill-defined heart diseases: Secondary | ICD-10-CM

## 2019-11-29 DIAGNOSIS — I519 Heart disease, unspecified: Secondary | ICD-10-CM

## 2019-11-29 DIAGNOSIS — E7800 Pure hypercholesterolemia, unspecified: Secondary | ICD-10-CM | POA: Diagnosis not present

## 2019-11-29 DIAGNOSIS — I10 Essential (primary) hypertension: Secondary | ICD-10-CM | POA: Diagnosis not present

## 2019-11-29 DIAGNOSIS — J690 Pneumonitis due to inhalation of food and vomit: Secondary | ICD-10-CM | POA: Diagnosis not present

## 2019-11-29 DIAGNOSIS — K56609 Unspecified intestinal obstruction, unspecified as to partial versus complete obstruction: Secondary | ICD-10-CM | POA: Diagnosis not present

## 2019-11-29 DIAGNOSIS — E079 Disorder of thyroid, unspecified: Secondary | ICD-10-CM | POA: Diagnosis not present

## 2019-11-29 DIAGNOSIS — J180 Bronchopneumonia, unspecified organism: Secondary | ICD-10-CM

## 2019-11-29 DIAGNOSIS — I5032 Chronic diastolic (congestive) heart failure: Secondary | ICD-10-CM

## 2019-11-29 DIAGNOSIS — D638 Anemia in other chronic diseases classified elsewhere: Secondary | ICD-10-CM | POA: Diagnosis not present

## 2019-11-29 DIAGNOSIS — E119 Type 2 diabetes mellitus without complications: Secondary | ICD-10-CM | POA: Diagnosis not present

## 2019-11-29 DIAGNOSIS — E440 Moderate protein-calorie malnutrition: Secondary | ICD-10-CM | POA: Diagnosis not present

## 2019-11-29 MED ORDER — LEVOFLOXACIN 500 MG PO TABS
500.0000 mg | ORAL_TABLET | Freq: Every day | ORAL | 0 refills | Status: DC
Start: 2019-11-29 — End: 2020-01-06

## 2019-11-29 NOTE — Telephone Encounter (Signed)
Referral signed. Thanks!

## 2019-11-29 NOTE — Telephone Encounter (Signed)
Gloria Murphy, daughter called and stated that patient seen Pulmonologist and they recommended patient to have a referral to a Cardiologist due to Grade I Heart Failure.  They stated that the referral has to come from patient's PCP.  Pended Referral and sent to Dr. Mariea Clonts for approval.  Please Advise.

## 2019-11-29 NOTE — Telephone Encounter (Signed)
Ok to d/c remeron.  I see she's actually lost 3 more lbs.

## 2019-11-29 NOTE — Patient Instructions (Addendum)
High resolution CAT scan showed evidence of bronchial pneumonia  Recommendations: - Sending in an antibiotic called Levaquin- take 1 tab daily for a total of 7 days - Start Mucinex 1 tablet twice daily - Encourage oral hydration - If patient develops fever, worsening shortness of breath or confusion please present to ED  Follow-up: 7-10 days televisit with Gloria Maize NP   Community-Acquired Pneumonia, Adult Pneumonia is an infection of the lungs. It causes swelling in the airways of the lungs. Mucus and fluid may also build up inside the airways. One type of pneumonia can happen while a person is in a hospital. A different type can happen when a person is not in a hospital (community-acquired pneumonia).  What are the causes?  This condition is caused by germs (viruses, bacteria, or fungi). Some types of germs can be passed from one person to another. This can happen when you breathe in droplets from the cough or sneeze of an infected person. What increases the risk? You are more likely to develop this condition if you:  Have a long-term (chronic) disease, such as: ? Chronic obstructive pulmonary disease (COPD). ? Asthma. ? Cystic fibrosis. ? Congestive heart failure. ? Diabetes. ? Kidney disease.  Have HIV.  Have sickle cell disease.  Have had your spleen removed.  Do not take good care of your teeth and mouth (poor dental hygiene).  Have a medical condition that increases the risk of breathing in droplets from your own mouth and nose.  Have a weakened body defense system (immune system).  Are a smoker.  Travel to areas where the germs that cause this illness are common.  Are around certain animals or the places they live. What are the signs or symptoms?  A dry cough.  A wet (productive) cough.  Fever.  Sweating.  Chest pain. This often happens when breathing deeply or coughing.  Fast breathing or trouble breathing.  Shortness of breath.  Shaking  chills.  Feeling tired (fatigue).  Muscle aches. How is this treated? Treatment for this condition depends on many things. Most adults can be treated at home. In some cases, treatment must happen in a hospital. Treatment may include:  Medicines given by mouth or through an IV tube.  Being given extra oxygen.  Respiratory therapy. In rare cases, treatment for very bad pneumonia may include:  Using a machine to help you breathe.  Having a procedure to remove fluid from around your lungs. Follow these instructions at home: Medicines  Take over-the-counter and prescription medicines only as told by your doctor. ? Only take cough medicine if you are losing sleep.  If you were prescribed an antibiotic medicine, take it as told by your doctor. Do not stop taking the antibiotic even if you start to feel better. General instructions   Sleep with your head and neck raised (elevated). You can do this by sleeping in a recliner or by putting a few pillows under your head.  Rest as needed. Get at least 8 hours of sleep each night.  Drink enough water to keep your pee (urine) pale yellow.  Eat a healthy diet that includes plenty of vegetables, fruits, whole grains, low-fat dairy products, and lean protein.  Do not use any products that contain nicotine or tobacco. These include cigarettes, e-cigarettes, and chewing tobacco. If you need help quitting, ask your doctor.  Keep all follow-up visits as told by your doctor. This is important. How is this prevented? A shot (vaccine) can help prevent pneumonia. Shots  are often suggested for:  People older than 82 years of age.  People older than 82 years of age who: ? Are having cancer treatment. ? Have long-term (chronic) lung disease. ? Have problems with their body's defense system. You may also prevent pneumonia if you take these actions:  Get the flu (influenza) shot every year.  Go to the dentist as often as told.  Wash your hands  often. If you cannot use soap and water, use hand sanitizer. Contact a doctor if:  You have a fever.  You lose sleep because your cough medicine does not help. Get help right away if:  You are short of breath and it gets worse.  You have more chest pain.  Your sickness gets worse. This is very serious if: ? You are an older adult. ? Your body's defense system is weak.  You cough up blood. Summary  Pneumonia is an infection of the lungs.  Most adults can be treated at home. Some will need treatment in a hospital.  Drink enough water to keep your pee pale yellow.  Get at least 8 hours of sleep each night. This information is not intended to replace advice given to you by your health care provider. Make sure you discuss any questions you have with your health care provider. Document Revised: 08/15/2018 Document Reviewed: 12/21/2017 Elsevier Patient Education  [place].

## 2019-11-29 NOTE — Telephone Encounter (Signed)
Patient daughter, Silva Bandy, called and wanted to know if patient could STOP taking the Remeron. Stated that it is not helping and she didn't want to take it.   Please Visit.

## 2019-11-29 NOTE — Progress Notes (Signed)
Virtual Visit via Telephone Note  I connected with Gloria Murphy on 11/29/19 at 12:00 PM EDT by telephone and verified that I am speaking with the correct person using two identifiers.  Location: Patient: Home Provider: Office   I discussed the limitations, risks, security and privacy concerns of performing an evaluation and management service by telephone and the availability of in person appointments. I also discussed with the patient that there may be a patient responsible charge related to this service. The patient expressed understanding and agreed to proceed.   History of Present Illness: 84 year old female.  Patient of Dr. Chase Caller, seen for initial consult on 11/07/2019.  She was ordered for high-resolution CT scan, echocardiogram, pulmonary function testing, overnight oximetry test.  11/29/2019 Patient contacted today by telephone to review recent testing results. Accompanied by her husband and daughter Gloria Murphy on phone call.  HRCT on 11/15/2019 showed severe multilobar bronchopneumonia.  No definitive imaging findings to suggest interstitial lung disease. She was unable to complete PFTs.    Family reports she has poor appetite and decreased energy. This has been on going for several months. She gets winded going to the bathroom. She has a bad cough but states that it is not every day. Most of the times her cough is dry; occasionally productive with clear mucus. Her last abx was in April. She has getting home health twice a week. Reports that her oxygen level was been between 96-99% on room air. Denies obvious aspiration or chocking when she eats. She has had covid vaccine.   Observations/Objective:  - Able to speak on televisit; Appears somewhat fatigued. No overt shortness of breath, wheezing or cough  Patient reported oxygen level 96-99%  TESTING: Echocardiogram- EF 76-72, grade 1 diastolic dysfunction.  Unable to complete PFTs  Assessment and  Plan:  Bronchopneumonia - Patient reports cough, decreased appetite and fatigue x 1-2 months - HRCT showed severe multilobar bronchopneumonia, no evidence of ILD - RX Levaquin 500mg  daily x 1 week - Recommend patient take mucinex twice daily and encourage push oral fluids - If patient develops fever, worsening respiratory symptoms or confusion advised she present to ED  Grade I diastolic heart failure: - Patient will follow-up with PCP, consider referral to cardiogy  Follow Up Instructions:  - FU in 7-10 days televisit   I discussed the assessment and treatment plan with the patient. The patient was provided an opportunity to ask questions and all were answered. The patient agreed with the plan and demonstrated an understanding of the instructions.   The patient was advised to call back or seek an in-person evaluation if the symptoms worsen or if the condition fails to improve as anticipated.  I provided 22 minutes of non-face-to-face time during this encounter.   Martyn Ehrich, NP

## 2019-12-02 ENCOUNTER — Telehealth: Payer: Self-pay | Admitting: Internal Medicine

## 2019-12-02 DIAGNOSIS — E119 Type 2 diabetes mellitus without complications: Secondary | ICD-10-CM | POA: Diagnosis not present

## 2019-12-02 DIAGNOSIS — E079 Disorder of thyroid, unspecified: Secondary | ICD-10-CM | POA: Diagnosis not present

## 2019-12-02 DIAGNOSIS — I10 Essential (primary) hypertension: Secondary | ICD-10-CM | POA: Diagnosis not present

## 2019-12-02 DIAGNOSIS — J690 Pneumonitis due to inhalation of food and vomit: Secondary | ICD-10-CM | POA: Diagnosis not present

## 2019-12-02 DIAGNOSIS — D638 Anemia in other chronic diseases classified elsewhere: Secondary | ICD-10-CM | POA: Diagnosis not present

## 2019-12-02 DIAGNOSIS — E7800 Pure hypercholesterolemia, unspecified: Secondary | ICD-10-CM | POA: Diagnosis not present

## 2019-12-02 DIAGNOSIS — E440 Moderate protein-calorie malnutrition: Secondary | ICD-10-CM | POA: Diagnosis not present

## 2019-12-02 DIAGNOSIS — K56609 Unspecified intestinal obstruction, unspecified as to partial versus complete obstruction: Secondary | ICD-10-CM | POA: Diagnosis not present

## 2019-12-02 NOTE — Telephone Encounter (Signed)
LMOM to return call.

## 2019-12-02 NOTE — Telephone Encounter (Signed)
ONO on RA </= 88% for 51 min -. Done 11/20/19  Plan   Start 2L [place]

## 2019-12-03 ENCOUNTER — Other Ambulatory Visit (HOSPITAL_COMMUNITY): Payer: PRIVATE HEALTH INSURANCE

## 2019-12-03 DIAGNOSIS — Z48815 Encounter for surgical aftercare following surgery on the digestive system: Secondary | ICD-10-CM | POA: Diagnosis not present

## 2019-12-03 DIAGNOSIS — R2689 Other abnormalities of gait and mobility: Secondary | ICD-10-CM | POA: Diagnosis not present

## 2019-12-03 DIAGNOSIS — C7A00 Malignant carcinoid tumor of unspecified site: Secondary | ICD-10-CM | POA: Diagnosis not present

## 2019-12-03 DIAGNOSIS — K56609 Unspecified intestinal obstruction, unspecified as to partial versus complete obstruction: Secondary | ICD-10-CM | POA: Diagnosis not present

## 2019-12-03 DIAGNOSIS — G9341 Metabolic encephalopathy: Secondary | ICD-10-CM | POA: Diagnosis not present

## 2019-12-05 DIAGNOSIS — J690 Pneumonitis due to inhalation of food and vomit: Secondary | ICD-10-CM | POA: Diagnosis not present

## 2019-12-05 DIAGNOSIS — E7800 Pure hypercholesterolemia, unspecified: Secondary | ICD-10-CM

## 2019-12-05 DIAGNOSIS — E119 Type 2 diabetes mellitus without complications: Secondary | ICD-10-CM | POA: Diagnosis not present

## 2019-12-05 DIAGNOSIS — D638 Anemia in other chronic diseases classified elsewhere: Secondary | ICD-10-CM | POA: Diagnosis not present

## 2019-12-05 DIAGNOSIS — I10 Essential (primary) hypertension: Secondary | ICD-10-CM | POA: Diagnosis not present

## 2019-12-05 DIAGNOSIS — K56609 Unspecified intestinal obstruction, unspecified as to partial versus complete obstruction: Secondary | ICD-10-CM

## 2019-12-05 DIAGNOSIS — E079 Disorder of thyroid, unspecified: Secondary | ICD-10-CM

## 2019-12-05 DIAGNOSIS — E440 Moderate protein-calorie malnutrition: Secondary | ICD-10-CM

## 2019-12-05 NOTE — Telephone Encounter (Signed)
Called and spoke with pt's husband letting him know the results of pt's ONO and that it qualifies her for O2. At this time pt and husband want to hold off on beginning O2 at bedtime. Nothing further needed.

## 2019-12-05 NOTE — Telephone Encounter (Signed)
Discussed Dr.Reed's response with Silva Bandy

## 2019-12-05 NOTE — Telephone Encounter (Signed)
LMOM to return call.

## 2019-12-07 DIAGNOSIS — J180 Bronchopneumonia, unspecified organism: Secondary | ICD-10-CM | POA: Diagnosis not present

## 2019-12-07 DIAGNOSIS — E7800 Pure hypercholesterolemia, unspecified: Secondary | ICD-10-CM | POA: Diagnosis not present

## 2019-12-07 DIAGNOSIS — J690 Pneumonitis due to inhalation of food and vomit: Secondary | ICD-10-CM | POA: Diagnosis not present

## 2019-12-07 DIAGNOSIS — E119 Type 2 diabetes mellitus without complications: Secondary | ICD-10-CM | POA: Diagnosis not present

## 2019-12-07 DIAGNOSIS — K56609 Unspecified intestinal obstruction, unspecified as to partial versus complete obstruction: Secondary | ICD-10-CM | POA: Diagnosis not present

## 2019-12-07 DIAGNOSIS — E440 Moderate protein-calorie malnutrition: Secondary | ICD-10-CM | POA: Diagnosis not present

## 2019-12-07 DIAGNOSIS — D638 Anemia in other chronic diseases classified elsewhere: Secondary | ICD-10-CM | POA: Diagnosis not present

## 2019-12-07 DIAGNOSIS — I10 Essential (primary) hypertension: Secondary | ICD-10-CM | POA: Diagnosis not present

## 2019-12-07 DIAGNOSIS — E079 Disorder of thyroid, unspecified: Secondary | ICD-10-CM | POA: Diagnosis not present

## 2019-12-11 ENCOUNTER — Telehealth: Payer: Self-pay | Admitting: *Deleted

## 2019-12-11 ENCOUNTER — Encounter: Payer: Self-pay | Admitting: Internal Medicine

## 2019-12-11 DIAGNOSIS — E440 Moderate protein-calorie malnutrition: Secondary | ICD-10-CM | POA: Diagnosis not present

## 2019-12-11 DIAGNOSIS — E7800 Pure hypercholesterolemia, unspecified: Secondary | ICD-10-CM | POA: Diagnosis not present

## 2019-12-11 DIAGNOSIS — E119 Type 2 diabetes mellitus without complications: Secondary | ICD-10-CM | POA: Diagnosis not present

## 2019-12-11 DIAGNOSIS — I10 Essential (primary) hypertension: Secondary | ICD-10-CM | POA: Diagnosis not present

## 2019-12-11 DIAGNOSIS — J180 Bronchopneumonia, unspecified organism: Secondary | ICD-10-CM | POA: Diagnosis not present

## 2019-12-11 DIAGNOSIS — D638 Anemia in other chronic diseases classified elsewhere: Secondary | ICD-10-CM | POA: Diagnosis not present

## 2019-12-11 DIAGNOSIS — E079 Disorder of thyroid, unspecified: Secondary | ICD-10-CM | POA: Diagnosis not present

## 2019-12-11 DIAGNOSIS — J690 Pneumonitis due to inhalation of food and vomit: Secondary | ICD-10-CM | POA: Diagnosis not present

## 2019-12-11 DIAGNOSIS — K56609 Unspecified intestinal obstruction, unspecified as to partial versus complete obstruction: Secondary | ICD-10-CM | POA: Diagnosis not present

## 2019-12-11 NOTE — Telephone Encounter (Signed)
[place] for nurse to obtain UA c+s.  I'd also like a cbc and bmp drawn if possible to help with antibiotic selection and determining severity of infection (if she has one)

## 2019-12-11 NOTE — Telephone Encounter (Signed)
Nurse notified and agreed.  

## 2019-12-11 NOTE — Telephone Encounter (Signed)
Gloria Murphy with [facility] called and stated that she was out at the patient's home and patient is complaining of Pressure with Urination and blood in urine. No abdominal pain. No fever. Started yesterday.   Nurse is requesting a verbal order to obtain Urine for U/A and Cx.   Please Advise.

## 2019-12-12 DIAGNOSIS — I10 Essential (primary) hypertension: Secondary | ICD-10-CM | POA: Diagnosis not present

## 2019-12-12 DIAGNOSIS — D638 Anemia in other chronic diseases classified elsewhere: Secondary | ICD-10-CM | POA: Diagnosis not present

## 2019-12-12 DIAGNOSIS — J180 Bronchopneumonia, unspecified organism: Secondary | ICD-10-CM | POA: Diagnosis not present

## 2019-12-12 DIAGNOSIS — E7800 Pure hypercholesterolemia, unspecified: Secondary | ICD-10-CM | POA: Diagnosis not present

## 2019-12-12 DIAGNOSIS — J690 Pneumonitis due to inhalation of food and vomit: Secondary | ICD-10-CM | POA: Diagnosis not present

## 2019-12-12 DIAGNOSIS — E440 Moderate protein-calorie malnutrition: Secondary | ICD-10-CM | POA: Diagnosis not present

## 2019-12-12 DIAGNOSIS — E119 Type 2 diabetes mellitus without complications: Secondary | ICD-10-CM | POA: Diagnosis not present

## 2019-12-12 DIAGNOSIS — K56609 Unspecified intestinal obstruction, unspecified as to partial versus complete obstruction: Secondary | ICD-10-CM | POA: Diagnosis not present

## 2019-12-12 DIAGNOSIS — E079 Disorder of thyroid, unspecified: Secondary | ICD-10-CM | POA: Diagnosis not present

## 2019-12-13 DIAGNOSIS — E119 Type 2 diabetes mellitus without complications: Secondary | ICD-10-CM | POA: Diagnosis not present

## 2019-12-13 DIAGNOSIS — J690 Pneumonitis due to inhalation of food and vomit: Secondary | ICD-10-CM | POA: Diagnosis not present

## 2019-12-13 DIAGNOSIS — E7800 Pure hypercholesterolemia, unspecified: Secondary | ICD-10-CM | POA: Diagnosis not present

## 2019-12-13 DIAGNOSIS — E079 Disorder of thyroid, unspecified: Secondary | ICD-10-CM | POA: Diagnosis not present

## 2019-12-13 DIAGNOSIS — D638 Anemia in other chronic diseases classified elsewhere: Secondary | ICD-10-CM | POA: Diagnosis not present

## 2019-12-13 DIAGNOSIS — K56609 Unspecified intestinal obstruction, unspecified as to partial versus complete obstruction: Secondary | ICD-10-CM | POA: Diagnosis not present

## 2019-12-13 DIAGNOSIS — I10 Essential (primary) hypertension: Secondary | ICD-10-CM | POA: Diagnosis not present

## 2019-12-13 DIAGNOSIS — E440 Moderate protein-calorie malnutrition: Secondary | ICD-10-CM | POA: Diagnosis not present

## 2019-12-13 DIAGNOSIS — J180 Bronchopneumonia, unspecified organism: Secondary | ICD-10-CM | POA: Diagnosis not present

## 2019-12-14 ENCOUNTER — Other Ambulatory Visit: Payer: Self-pay | Admitting: Internal Medicine

## 2019-12-16 DIAGNOSIS — E7800 Pure hypercholesterolemia, unspecified: Secondary | ICD-10-CM | POA: Diagnosis not present

## 2019-12-16 DIAGNOSIS — K56609 Unspecified intestinal obstruction, unspecified as to partial versus complete obstruction: Secondary | ICD-10-CM | POA: Diagnosis not present

## 2019-12-16 DIAGNOSIS — D638 Anemia in other chronic diseases classified elsewhere: Secondary | ICD-10-CM | POA: Diagnosis not present

## 2019-12-16 DIAGNOSIS — E079 Disorder of thyroid, unspecified: Secondary | ICD-10-CM | POA: Diagnosis not present

## 2019-12-16 DIAGNOSIS — J180 Bronchopneumonia, unspecified organism: Secondary | ICD-10-CM | POA: Diagnosis not present

## 2019-12-16 DIAGNOSIS — E119 Type 2 diabetes mellitus without complications: Secondary | ICD-10-CM | POA: Diagnosis not present

## 2019-12-16 DIAGNOSIS — E440 Moderate protein-calorie malnutrition: Secondary | ICD-10-CM | POA: Diagnosis not present

## 2019-12-16 DIAGNOSIS — J690 Pneumonitis due to inhalation of food and vomit: Secondary | ICD-10-CM | POA: Diagnosis not present

## 2019-12-16 DIAGNOSIS — I10 Essential (primary) hypertension: Secondary | ICD-10-CM | POA: Diagnosis not present

## 2019-12-18 DIAGNOSIS — E079 Disorder of thyroid, unspecified: Secondary | ICD-10-CM | POA: Diagnosis not present

## 2019-12-18 DIAGNOSIS — E440 Moderate protein-calorie malnutrition: Secondary | ICD-10-CM | POA: Diagnosis not present

## 2019-12-18 DIAGNOSIS — E7800 Pure hypercholesterolemia, unspecified: Secondary | ICD-10-CM | POA: Diagnosis not present

## 2019-12-18 DIAGNOSIS — J180 Bronchopneumonia, unspecified organism: Secondary | ICD-10-CM | POA: Diagnosis not present

## 2019-12-18 DIAGNOSIS — I10 Essential (primary) hypertension: Secondary | ICD-10-CM | POA: Diagnosis not present

## 2019-12-18 DIAGNOSIS — K56609 Unspecified intestinal obstruction, unspecified as to partial versus complete obstruction: Secondary | ICD-10-CM | POA: Diagnosis not present

## 2019-12-18 DIAGNOSIS — E119 Type 2 diabetes mellitus without complications: Secondary | ICD-10-CM | POA: Diagnosis not present

## 2019-12-18 DIAGNOSIS — J690 Pneumonitis due to inhalation of food and vomit: Secondary | ICD-10-CM | POA: Diagnosis not present

## 2019-12-18 DIAGNOSIS — D638 Anemia in other chronic diseases classified elsewhere: Secondary | ICD-10-CM | POA: Diagnosis not present

## 2019-12-19 ENCOUNTER — Other Ambulatory Visit: Payer: Self-pay | Admitting: *Deleted

## 2019-12-19 MED ORDER — ACCU-CHEK SOFT TOUCH LANCETS MISC: 5 refills | Status: AC

## 2019-12-19 MED ORDER — GLUCOSE BLOOD VI STRP: ORAL_STRIP | 5 refills | Status: AC

## 2019-12-19 NOTE — Telephone Encounter (Signed)
Patient husband requested refill.  

## 2019-12-20 ENCOUNTER — Telehealth: Payer: Self-pay

## 2019-12-20 NOTE — Telephone Encounter (Signed)
Results given to [facility] and he would like to know should he do something about the anemia (meds) and he also state that she is not having any prol

## 2019-12-20 NOTE — Telephone Encounter (Signed)
Computer went out while I was typing. Diona Fanti said that she is not having any problems with urine right now. He would like to know if he should do anything about the low anemia (meds)

## 2019-12-21 NOTE — Telephone Encounter (Signed)
I don't see those labs abstracted in her chart from the home health.  I was reviewing pulmonary notes and they indicate she qualifies for oxygen therapy.  Ideally, to help her breathing, we should get her blood counts over 10.  The challenge is that she had a prior bowel obstruction and iron is constipating.  It will be crucial that she hydrates, gets enough fiber and continues her bowel regimen to prevent another blockage if we start back on iron.  Prescription will be for ferrous gluconate 150mg  po daily with a meal.  Please send to preferred pharmacy

## 2019-12-22 ENCOUNTER — Telehealth: Payer: Self-pay | Admitting: Internal Medicine

## 2019-12-22 DIAGNOSIS — J180 Bronchopneumonia, unspecified organism: Secondary | ICD-10-CM

## 2019-12-22 NOTE — Telephone Encounter (Signed)
Gloria Murphy saw Gloria Murphy 11/29/19 fir the below results. Abx for 7-10 days was given and followup requested but I do not see one  Plan  - get CXR end of august 2021  - face to face with app end of aug 2021  Thanks  MR  zzzzzzzzzzzzzzzzzzzzzz   IMPRESSION: 1. The appearance the chest is most suggestive of severe multilobar bronchopneumonia. No definitive imaging findings to suggest interstitial lung disease. 2. Aortic atherosclerosis, in addition to left main and 3 vessel coronary artery disease. Please note that although the presence of coronary artery calcium documents the presence of coronary artery disease, the severity of this disease and any potential stenosis cannot be assessed on this non-gated CT examination. Assessment for potential risk factor modification, dietary therapy or pharmacologic therapy may be warranted, if clinically indicated.  Aortic Atherosclerosis (ICD10-I70.0).   Electronically Signed   By: Vinnie Langton M.D.   On: 11/15/2019 11:46  Echocardiogram 7/8   1. Left ventricular ejection fraction, by estimation, is 60 to 65%. The  left ventricle has normal function. The left ventricle has no regional  wall motion abnormalities. Left ventricular diastolic parameters are  consistent with Grade I diastolic  dysfunction (impaired relaxation).  2. Right ventricular systolic function is normal. The right ventricular  size is normal. There is mildly elevated pulmonary artery systolic  pressure.  3. The mitral valve is normal in structure. Trivial mitral valve  regurgitation. No evidence of mitral stenosis.  4. The aortic valve is tricuspid. Aortic valve regurgitation is not  visualized. Mild aortic valve sclerosis is present, with no evidence of  aortic valve stenosis.  5. The inferior vena cava is normal in size with greater than 50%  respiratory variability, suggesting right atrial pressure of 3 mmHg.

## 2019-12-23 DIAGNOSIS — E119 Type 2 diabetes mellitus without complications: Secondary | ICD-10-CM | POA: Diagnosis not present

## 2019-12-23 DIAGNOSIS — E7800 Pure hypercholesterolemia, unspecified: Secondary | ICD-10-CM | POA: Diagnosis not present

## 2019-12-23 DIAGNOSIS — K56609 Unspecified intestinal obstruction, unspecified as to partial versus complete obstruction: Secondary | ICD-10-CM | POA: Diagnosis not present

## 2019-12-23 DIAGNOSIS — E079 Disorder of thyroid, unspecified: Secondary | ICD-10-CM | POA: Diagnosis not present

## 2019-12-23 DIAGNOSIS — I10 Essential (primary) hypertension: Secondary | ICD-10-CM | POA: Diagnosis not present

## 2019-12-23 DIAGNOSIS — J690 Pneumonitis due to inhalation of food and vomit: Secondary | ICD-10-CM | POA: Diagnosis not present

## 2019-12-23 DIAGNOSIS — E440 Moderate protein-calorie malnutrition: Secondary | ICD-10-CM | POA: Diagnosis not present

## 2019-12-23 DIAGNOSIS — D638 Anemia in other chronic diseases classified elsewhere: Secondary | ICD-10-CM | POA: Diagnosis not present

## 2019-12-23 NOTE — Addendum Note (Signed)
Addended by: Tanna Savoy on: 12/23/2019 10:29 AM   Modules accepted: Orders

## 2019-12-23 NOTE — Telephone Encounter (Signed)
DrMariea Clonts this does not come in 150mg  its giving me a flag we can only order in

## 2019-12-25 NOTE — Telephone Encounter (Signed)
Called and spoke with pt's husband letting him know the info stated by MR and he verbalized understanding. Pt has been scheduled for a f/u with [facility] 8/30 and also made him aware that we would be getting a cxr prior. Order placed for the cxr. Nothing further needed.

## 2019-12-26 ENCOUNTER — Other Ambulatory Visit: Payer: Self-pay

## 2019-12-26 ENCOUNTER — Other Ambulatory Visit: Payer: Medicare Other | Admitting: Nurse Practitioner

## 2019-12-26 ENCOUNTER — Encounter: Payer: Self-pay | Admitting: Nurse Practitioner

## 2019-12-26 DIAGNOSIS — E46 Unspecified protein-calorie malnutrition: Secondary | ICD-10-CM | POA: Diagnosis not present

## 2019-12-26 DIAGNOSIS — E079 Disorder of thyroid, unspecified: Secondary | ICD-10-CM | POA: Diagnosis not present

## 2019-12-26 DIAGNOSIS — Z515 Encounter for palliative care: Secondary | ICD-10-CM | POA: Diagnosis not present

## 2019-12-26 DIAGNOSIS — D638 Anemia in other chronic diseases classified elsewhere: Secondary | ICD-10-CM | POA: Diagnosis not present

## 2019-12-26 DIAGNOSIS — K56609 Unspecified intestinal obstruction, unspecified as to partial versus complete obstruction: Secondary | ICD-10-CM | POA: Diagnosis not present

## 2019-12-26 DIAGNOSIS — E7800 Pure hypercholesterolemia, unspecified: Secondary | ICD-10-CM | POA: Diagnosis not present

## 2019-12-26 DIAGNOSIS — E119 Type 2 diabetes mellitus without complications: Secondary | ICD-10-CM | POA: Diagnosis not present

## 2019-12-26 DIAGNOSIS — J690 Pneumonitis due to inhalation of food and vomit: Secondary | ICD-10-CM | POA: Diagnosis not present

## 2019-12-26 DIAGNOSIS — E440 Moderate protein-calorie malnutrition: Secondary | ICD-10-CM | POA: Diagnosis not present

## 2019-12-26 DIAGNOSIS — I10 Essential (primary) hypertension: Secondary | ICD-10-CM | POA: Diagnosis not present

## 2019-12-26 NOTE — Progress Notes (Signed)
[facility] Consult Note Telephone: [phone]  Fax: [phone]  PATIENT NAME: Gloria Murphy DOB: August 29, 1935 MRN: [id number]  PRIMARY CARE PROVIDER:   Gayland Curry, DO  REFERRING PROVIDER:  Gayland Curry, DO [place],  [place] [address] RESPONSIBLE PARTY:   Self; husband Dayzee Trower [phone]; [phone]  RECOMMENDATIONS and PLAN: 1.ACP: DNR, MOST form completed placed in [facility]; wishes are to treat what is treatable. No intubation/no ventilator/no feeding tube; wishes are for hospitalization if necessary, IVF/antibiotics  2.Palliative care encounter; Palliative medicine team will continue to support patient, patient's family, and medical team. Visit consisted of counseling and education dealing with the complex and emotionally intense issues of symptom management and palliative care in the setting of serious and potentially life-threatening illness  3. F/u 2 months for ongoing monitoring chronic disease progression, weight, goc  I spent 60 minutes providing this consultation,  From 1:00pm to 2:00pm. More than 50% of the time in this consultation was spent coordinating communication.   HISTORY OF PRESENT ILLNESS:  Gloria Murphy is a 84 y.o. year old female with multiple medical problems including Malignant carcinoid tumor of left adnexa s/p exploratory laparoscopy with bilateral salpingo ophorectomy, omentectomy, and radical debunking for suspected ovarian cancer 08/2017, anemia, TIA, diabetes, thyroid disease, hypertension, resection of pelvic mass, small bowel obstruction s/p adhesiolysis 07/01/2019, severe protein malnutrition, laparoscopy, right knee surgery, bilateral cataracts, abdominal hysterectomy. In-person palliative care follow-up visit with Mr. Gloria Murphy. Covid screening negative. We talked about purpose of palliative care visit. Gloria Murphy in agreement. Gloria. Murphy endorse is that she is doing so much better.  Gloria Murphy endorses physical therapy is getting ready to discharge her. Gloria Murphy endorses she ambulates without a walker or cane. No recent falls. Gloria. Murphy is able now to do her own bath come and get herself dressed. Her appetite has been improving with current weight 141.0 lb. We talked about Gloria Murphy going out to eat. Gloria Murphy endorses the food it is not as good as it used to be taste-wise but she is hoping that it will improve. Gloria Murphy endorses no recent infections, wounds, hospitalizations. Gloria Murphy endorses she is not 100% sure why she got so sick but she's glad she is better. We talked about her adult children to that live in [place]. They come every several weeks to see her and one that lives close that helps care for her daily. Gloria Murphy endorses she is almost back to her baseline prior to hospitalization and Mr. Kramme is in agreement. We talked about symptoms of pain and shortness of breath what she does not exhibit. Gloria. Murphy endorses that she is sleeping well at night. Gloria. Murphy and I talked about her wanting to go dancing. Mr and Gloria Guy Sandifer are planning a trip to the beach. We talked about role of palliative care and plan of care. Discussed at present time Gloria. Murphy's is stable and will follow up in 2 months or sooner should she did fine. Mr and Gloria Warning both in agreement and appointments scheduled. Therapeutic listening and emotional support provided. Praised Gloria. Murphy for all of her hard work with therapy and recovery. Gloria Murphy endorses she was glad for palliative care visit. Questions answered to satisfaction. Contact information  Palliative Care was asked to help address goals of care.   CODE STATUS: DNR  PPS: 50% HOSPICE ELIGIBILITY/DIAGNOSIS: TBD  PAST MEDICAL HISTORY:  Past Medical History:  Diagnosis Date  .  Diabetes mellitus without complication ([place])   . High cholesterol   . Hypertension   . Thyroid disease     SOCIAL HX:  Social History   Tobacco Use  . Smoking  status: Never Smoker  . Smokeless tobacco: Never Used  Substance Use Topics  . Alcohol use: No    ALLERGIES: No Known Allergies   PERTINENT MEDICATIONS:  Outpatient Encounter Medications as of 12/26/2019  Medication Sig  . amLODipine (NORVASC) 5 MG tablet Take 5 mg by mouth daily.  [name] aspirin EC 81 MG tablet Take 1 tablet (81 mg total) by mouth daily.  . Calcium Carb-Cholecalciferol (CALTRATE 600+D3) 600-800 MG-UNIT TABS Take one tablet by mouth once daily.  . carboxymethylcellulose (REFRESH TEARS) 0.5 % SOLN Place 1 drop into both eyes 3 (three) times daily.  . carvedilol (COREG) 6.25 MG tablet TAKE 1 TABLET(6.25 MG) BY MOUTH TWICE DAILY WITH A MEAL  . chlorhexidine (PERIDEX) 0.12 % solution Use as directed 15 mLs in the mouth or throat daily.  . famotidine (PEPCID) 20 MG tablet Take 20 mg by mouth as needed.   . furosemide (LASIX) 20 MG tablet Take 20 mg by mouth. Take 1/2 pill a day 10mg   . glucose blood test strip Use to test blood sugar daily. Dx: E11.9  . guaiFENesin (ROBITUSSIN) 100 MG/5ML SOLN Take 5 mLs (100 mg total) by mouth every 4 (four) hours as needed for cough or to loosen phlegm.  . Lancets (ACCU-CHEK SOFT TOUCH) lancets Use to test blood sugar daily. Dx: E11.9  . levofloxacin (LEVAQUIN) 500 MG tablet Take 1 tablet (500 mg total) by mouth daily.  [name] MAGNESIUM OXIDE PO Take 1 tablet by mouth daily.   . mirtazapine (REMERON) 7.5 MG tablet Take 1 tablet (7.5 mg total) by mouth at bedtime.  . polyethylene glycol (MIRALAX / GLYCOLAX) 17 g packet Take 17 g by mouth as needed.  . potassium chloride SA (KLOR-CON) 20 MEQ tablet Take 1 tablet (20 mEq total) by mouth daily.   No facility-administered encounter medications on file as of 12/26/2019.    PHYSICAL EXAM:   General: NAD, pleasant female Cardiovascular: regular rate and rhythm Pulmonary: clear ant fields Neurological: ambulatory Gloria Murphy Gloria Gully, NP

## 2019-12-30 DIAGNOSIS — E7800 Pure hypercholesterolemia, unspecified: Secondary | ICD-10-CM | POA: Diagnosis not present

## 2019-12-30 DIAGNOSIS — D638 Anemia in other chronic diseases classified elsewhere: Secondary | ICD-10-CM | POA: Diagnosis not present

## 2019-12-30 DIAGNOSIS — J690 Pneumonitis due to inhalation of food and vomit: Secondary | ICD-10-CM | POA: Diagnosis not present

## 2019-12-30 DIAGNOSIS — I10 Essential (primary) hypertension: Secondary | ICD-10-CM | POA: Diagnosis not present

## 2019-12-30 DIAGNOSIS — K56609 Unspecified intestinal obstruction, unspecified as to partial versus complete obstruction: Secondary | ICD-10-CM | POA: Diagnosis not present

## 2019-12-30 DIAGNOSIS — E079 Disorder of thyroid, unspecified: Secondary | ICD-10-CM | POA: Diagnosis not present

## 2019-12-30 DIAGNOSIS — E119 Type 2 diabetes mellitus without complications: Secondary | ICD-10-CM | POA: Diagnosis not present

## 2019-12-30 DIAGNOSIS — E440 Moderate protein-calorie malnutrition: Secondary | ICD-10-CM | POA: Diagnosis not present

## 2020-01-01 DIAGNOSIS — D638 Anemia in other chronic diseases classified elsewhere: Secondary | ICD-10-CM | POA: Diagnosis not present

## 2020-01-01 DIAGNOSIS — I10 Essential (primary) hypertension: Secondary | ICD-10-CM | POA: Diagnosis not present

## 2020-01-01 DIAGNOSIS — K56609 Unspecified intestinal obstruction, unspecified as to partial versus complete obstruction: Secondary | ICD-10-CM | POA: Diagnosis not present

## 2020-01-01 DIAGNOSIS — J690 Pneumonitis due to inhalation of food and vomit: Secondary | ICD-10-CM | POA: Diagnosis not present

## 2020-01-01 DIAGNOSIS — E7800 Pure hypercholesterolemia, unspecified: Secondary | ICD-10-CM | POA: Diagnosis not present

## 2020-01-01 DIAGNOSIS — E440 Moderate protein-calorie malnutrition: Secondary | ICD-10-CM | POA: Diagnosis not present

## 2020-01-01 DIAGNOSIS — E119 Type 2 diabetes mellitus without complications: Secondary | ICD-10-CM | POA: Diagnosis not present

## 2020-01-01 DIAGNOSIS — E079 Disorder of thyroid, unspecified: Secondary | ICD-10-CM | POA: Diagnosis not present

## 2020-01-03 DIAGNOSIS — Z48815 Encounter for surgical aftercare following surgery on the digestive system: Secondary | ICD-10-CM | POA: Diagnosis not present

## 2020-01-03 DIAGNOSIS — R2689 Other abnormalities of gait and mobility: Secondary | ICD-10-CM | POA: Diagnosis not present

## 2020-01-03 DIAGNOSIS — G9341 Metabolic encephalopathy: Secondary | ICD-10-CM | POA: Diagnosis not present

## 2020-01-03 DIAGNOSIS — C7A00 Malignant carcinoid tumor of unspecified site: Secondary | ICD-10-CM | POA: Diagnosis not present

## 2020-01-03 DIAGNOSIS — K56609 Unspecified intestinal obstruction, unspecified as to partial versus complete obstruction: Secondary | ICD-10-CM | POA: Diagnosis not present

## 2020-01-06 ENCOUNTER — Encounter: Payer: Self-pay | Admitting: Primary Care

## 2020-01-06 ENCOUNTER — Ambulatory Visit (INDEPENDENT_AMBULATORY_CARE_PROVIDER_SITE_OTHER): Payer: Medicare Other | Admitting: Primary Care

## 2020-01-06 ENCOUNTER — Other Ambulatory Visit: Payer: Self-pay

## 2020-01-06 ENCOUNTER — Ambulatory Visit (INDEPENDENT_AMBULATORY_CARE_PROVIDER_SITE_OTHER): Payer: Medicare Other

## 2020-01-06 VITALS — BP 118/74 | HR 86 | Temp 98.2°F | Ht 69.0 in | Wt 134.0 lb

## 2020-01-06 DIAGNOSIS — J180 Bronchopneumonia, unspecified organism: Secondary | ICD-10-CM

## 2020-01-06 DIAGNOSIS — J189 Pneumonia, unspecified organism: Secondary | ICD-10-CM | POA: Diagnosis not present

## 2020-01-06 NOTE — Progress Notes (Signed)
@Patient  ID: Gloria Murphy, female    DOB: Jan 12, 1936, 84 y.o.   MRN: [id number]  Chief Complaint  Patient presents with  . Follow-up    Referring provider: Gayland Curry, DO  HPI: 84 year old female, never smoked.  Past medical history significant for bronchopneumonia, dysphagia, TID, HTN, type 2 diabetes.  Patient of Dr. Chase Caller, seen for initial consult on 11/07/19 for exertional dyspnea.  Previous [facility] pulmonary encounter: 11/07/19 HPI- Consult  Gloria Murphy 84 y.o. -is a fatigued deconditioned lady whose CODE STATUS appears to be DNR.  She is accompanied by her husband.  History dates back to mid February 2021 when she was admitted for status post small bowel obstruction status post adhesiolysis July 01, 2019.  She had malignant carcinoid tumor to the left adnexa.  Course was complicated by acute kidney injury [as of in May 2021 she had no kidney injury and a hemoglobin of 11.1 g%.].  Sometime after that according to the husband dyspnea started [facility] in October 2020 she had a right heart catheterization that was normal by Dr. Virgina Jock and that was for dyspnea].  The dyspnea is somewhat been stable.  Then in early May 2021 she developed cough and by end of May 2020 when she was admitted for pneumonia not otherwise specified.  I do not see any CT scan imaging during this entire time in 2021.  This is particularly of the chest.  She was treated with oxygen antibiotics per chart review and then discharge.  At discharge she did not require any oxygen.  However she says the cough persists and the shortness of breath persist.  Cough is the more dominant symptoms.  Review of the blood tests reveals possible high use no.  At the end of May 2021 at the time of admission.  Cough is described as congestion unclear what color of the sputum.  The cough is significant.  Unclear if this orthopnea proximal nocturnal dyspnea.  I did review CT scan of the abdomen from February 2021 with associated  lung images.  There is possible infiltrates suggestive of ILD in the lung base   Simple office walk 185 feet x  3 laps goal with forehead probe 11/07/2019   O2 used ra  Number laps completed Did only 2 laps  Comments about pace Slow pace, 2 rest perioids, felt dyspneic  Resting Pulse Ox/HR 100% and 89/min  Final Pulse Ox/HR 100% and 100/min  Desaturated </= 88% no  Desaturated <= 3% points no  Got Tachycardic >/= 90/min yes  Symptoms at end of test Dyspnea, stopped  Miscellaneous comments x   Results for Gloria Murphy (MRN [id number]) as of 11/07/2019 14:17  Ref. Range 07/22/2019 03:30 09/30/2019 00:25 09/30/2019 04:35 10/02/2019 02:12 10/03/2019 15:39  Eosinophils Absolute Latest Ref Range: 15 - 500 cells/uL 0.2 0.1 0.1 0.1 148    11/29/19- Televisit  84 year old female.  Patient of Dr. Chase Caller, seen for initial consult on 11/07/2019.  She was ordered for high-resolution CT scan, echocardiogram, pulmonary function testing, overnight oximetry test.  Patient contacted today by telephone to review recent testing results. Accompanied by her husband and daughter Alver Sorrow on phone call.  HRCT on 11/15/2019 showed severe multilobar bronchopneumonia.  No definitive imaging findings to suggest interstitial lung disease. She was unable to complete PFTs.    Family reports she has poor appetite and decreased energy. This has been on going for several months. She gets winded going to the bathroom. She has a bad cough  but states that it is not every day. Most of the times her cough is dry; occasionally productive with clear mucus. Her last abx was in April. She has getting home health twice a week. Reports that her oxygen level was been between 96-99% on room air. Denies obvious aspiration or chocking when she eats. She has had covid vaccine.    01/06/2020- Interim hx Patient presents today for follow-up office visit.  She was originally seen in July for [place] by Dr. Chase Caller. She experiences dyspnea  with activity.  HRCT showed multifocal areas of thickening and peribronchovascular nodularity and areas of peribronchovascular predominant airspace consolidation, most severe in mid to upper lungs.  Findings were new compared to CT a in June 2020.  Findings favor to reflect severe bronchopneumonia.  No definitive imaging findings to suggest ILD.  She was treated with course of Levaquin x 7 days. She feels pretty well today. She has seldom cough. Her appetite is lousy, this has been on going for several months. Chest x-ray today showed no significant interval change in extensive heterogeneous and nodular opacity throughout the lungs, most conspicuous in the right upper lobe and consistent with multifocal infection.  Sed rate 124. ANA positive. She has some joint pain which she attributes to arthritis. She was unable to complete PFTs in July 2021. She denies hemoptysis or chest pain.     Echocardiogram:  11/27/19-  Left ventricular ejection fraction, by estimation, is 60  to 65%. The left ventricle has normal function. The left ventricle has no  regional wall motion abnormalities. The left ventricular internal cavity  size was normal in size. There is  no left ventricular hypertrophy. Left ventricular diastolic parameters  are consistent with Grade I diastolic dysfunction   Imaging: 11/15/19 HRCT- Scattered throughout the lungs bilaterally there are multifocal areas of thickening of the peribronchovascular interstitium with peribronchovascular micro and macro nodularity and areas of peribronchovascular predominant airspace consolidation. These findings are most evident dependently in the lungs, most severe in the mid to upper lungs (although there is extensive micro nodularity in the left lower lobe as well). These findings are new compared to prior neck CTA 10/26/2018, and based on comparison with prior chest radiograph 09/30/2019, these findings are favored to be new and rapidly progressive.  Relative sparing of the anterior lungs  PFTs: - Patient unable to complete   No Known Allergies  Immunization History  Administered Date(s) Administered  . Influenza, High Dose Seasonal PF 03/07/2018, 02/02/2019  . PFIZER SARS-COV-2 Vaccination 06/17/2019, 08/13/2019    Past Medical History:  Diagnosis Date  . Diabetes mellitus without complication ([place])   . High cholesterol   . Hypertension   . Thyroid disease     Tobacco History: Social History   Tobacco Use  Smoking Status Never Smoker  Smokeless Tobacco Never Used   Counseling given: Not Answered   Outpatient Medications Prior to Visit  Medication Sig Dispense Refill  . amLODipine (NORVASC) 5 MG tablet Take 5 mg by mouth daily.    [name] aspirin EC 81 MG tablet Take 1 tablet (81 mg total) by mouth daily. 90 tablet 3  . Calcium Carb-Cholecalciferol (CALTRATE 600+D3) 600-800 MG-UNIT TABS Take one tablet by mouth once daily. 90 tablet 1  . carboxymethylcellulose (REFRESH TEARS) 0.5 % SOLN Place 1 drop into both eyes 3 (three) times daily.    . carvedilol (COREG) 6.25 MG tablet TAKE 1 TABLET(6.25 MG) BY MOUTH TWICE DAILY WITH A MEAL 180 tablet 0  . chlorhexidine (PERIDEX)  0.12 % solution Use as directed 15 mLs in the mouth or throat daily.    . famotidine (PEPCID) 20 MG tablet Take 20 mg by mouth as needed.     . furosemide (LASIX) 20 MG tablet Take 20 mg by mouth. Take 1/2 pill a day 10mg     . glucose blood test strip Use to test blood sugar daily. Dx: E11.9 100 each 5  . guaiFENesin (ROBITUSSIN) 100 MG/5ML SOLN Take 5 mLs (100 mg total) by mouth every 4 (four) hours as needed for cough or to loosen phlegm. 236 mL 0  . Lancets (ACCU-CHEK SOFT TOUCH) lancets Use to test blood sugar daily. Dx: E11.9 100 each 5  . MAGNESIUM OXIDE PO Take 1 tablet by mouth daily.     . mirtazapine (REMERON) 7.5 MG tablet Take 1 tablet (7.5 mg total) by mouth at bedtime. 90 tablet 1  . polyethylene glycol (MIRALAX / GLYCOLAX) 17 g packet Take 17  g by mouth as needed.    . potassium chloride SA (KLOR-CON) 20 MEQ tablet Take 1 tablet (20 mEq total) by mouth daily. 30 tablet 3  . levofloxacin (LEVAQUIN) 500 MG tablet Take 1 tablet (500 mg total) by mouth daily. 7 tablet 0   No facility-administered medications prior to visit.    Review of Systems  Review of Systems  Constitutional: Negative for fever.  Respiratory: Positive for cough. Negative for chest tightness, shortness of breath and wheezing.        DOE   Physical Exam  BP 118/74   Pulse 86   Temp 98.2 F (36.8 C)   Ht 5\' 9"  (1.753 m)   Wt 134 lb (60.8 kg)   SpO2 98%   BMI 19.79 kg/m  Physical Exam Constitutional:      Appearance: Normal appearance.  HENT:     Head: Normocephalic and atraumatic.     Mouth/Throat:     Mouth: Mucous membranes are moist.     Pharynx: Oropharynx is clear.  Cardiovascular:     Rate and Rhythm: Normal rate and regular rhythm.  Pulmonary:     Effort: Pulmonary effort is normal. No respiratory distress.     Breath sounds: Rales present. No wheezing or rhonchi.  Musculoskeletal:     Cervical back: Normal range of motion and neck supple.  Neurological:     General: No focal deficit present.     Mental Status: She is alert and oriented to person, place, and time. Mental status is at baseline.  Psychiatric:        Mood and Affect: Mood normal.        Behavior: Behavior normal.        Thought Content: Thought content normal.        Judgment: Judgment normal.      Lab Results:  CBC    Component Value Date/Time   WBC 5.8 11/07/2019 1449   RBC 3.81 (L) 11/07/2019 1449   HGB 9.8 (L) 11/07/2019 1449   HGB 11.9 02/04/2019 1720   HCT 29.0 (L) 11/07/2019 1449   HCT 36.0 02/04/2019 1720   PLT 294.0 11/07/2019 1449   PLT 179 02/04/2019 1720   MCV 76.3 (L) 11/07/2019 1449   MCV 80 02/04/2019 1720   MCH 25.2 (L) 10/03/2019 1539   MCHC 33.8 11/07/2019 1449   RDW 17.3 (H) 11/07/2019 1449   RDW 13.7 02/04/2019 1720   LYMPHSABS 0.9  11/07/2019 1449   MONOABS 1.0 11/07/2019 1449   EOSABS 0.1 11/07/2019 1449  BASOSABS 0.1 11/07/2019 1449    BMET    Component Value Date/Time   NA 132 (L) 11/07/2019 1449   NA 142 02/26/2019 1618   K 4.0 11/07/2019 1449   CL 98 11/07/2019 1449   CO2 27 11/07/2019 1449   GLUCOSE 101 (H) 11/07/2019 1449   BUN 14 11/07/2019 1449   BUN 19 02/26/2019 1618   CREATININE 0.83 11/07/2019 1449   CREATININE 0.69 10/03/2019 1539   CALCIUM 9.6 11/07/2019 1449   GFRNONAA 82 10/03/2019 1539   GFRAA 95 10/03/2019 1539    BNP No results found for: BNP  ProBNP No results found for: PROBNP  Imaging: DG Chest 2 View  Result Date: 01/06/2020 CLINICAL DATA:  Follow-up bronchopneumonia EXAM: CHEST - 2 VIEW COMPARISON:  CT chest, 11/14/2019 FINDINGS: The heart size and mediastinal contours are within normal limits. Allowing for cross modality comparison, no significant interval change in extensive heterogeneous and nodular airspace opacity throughout the lungs, most conspicuous in the right upper lobe. Disc degenerative disease of the thoracic spine. IMPRESSION: No significant interval change in extensive heterogeneous and nodular airspace opacity throughout the lungs, most conspicuous in the right upper lobe and consistent with multifocal infection. Electronically Signed   By: Eddie Candle M.D.   On: 01/06/2020 11:50     Assessment & Plan:   Bronchopneumonia - Seen for initial consult in July 2021 for dyspnea. HRCT revealed severe bronchopneumonia with no evidence of ILD. Treated with Levaquin 500mg  qd x 7 days. Clincally she is doing better. VSS. She has rare cough, continues to have some dysnea on exertion. She was unable to complete PFTs. We will check sputum sample. Likely needs repeat CT imaging in 4 weeks to continue to follow, if no improvement may need to consider bronchoscopy for further evaluation but unclear if she would be able to tolerate procedure.   Plan: - Respiratory sputum  samples, AFB and fungal cultures  - Continue mucinex twice daily for cough - We will repeat imaging in 4 weeks  - Notify us if you develop fever, purulent mucus or hemoptysis (blood in sputum)  Orders: - Sputum sample x3 (ordered)  Follow-up:  - 6 weeks with Dr. Illene Labrador, NP 01/06/2020

## 2020-01-06 NOTE — Assessment & Plan Note (Signed)
-   Seen for initial consult in July 2021 for dyspnea. HRCT revealed severe bronchopneumonia with no evidence of ILD. Treated with Levaquin 500mg  qd x 7 days. Clincally she is doing better. VSS. She has rare cough, continues to have some dysnea on exertion. She was unable to complete PFTs. We will check sputum sample. Likely needs repeat CT imaging in 4 weeks to continue to follow, if no improvement may need to consider bronchoscopy for further evaluation but unclear if she would be able to tolerate procedure.   Plan: - Respiratory sputum samples, AFB and fungal cultures  - Continue mucinex twice daily for cough - We will repeat imaging in 4 weeks  - Notify us if you develop fever, purulent mucus or hemoptysis (blood in sputum)  Orders: - Sputum sample x3 (ordered)  Follow-up:  - 6 weeks with Dr. Chase Caller

## 2020-01-06 NOTE — Patient Instructions (Addendum)
Recommendations: - If able please provide sputum sample  - Continue mucinex twice daily for cough - We will repeat imaging in 4 weeks  - Notify us if you develop fever, purulent mucus or hemoptysis (blood in sputum)  Orders: - Sputum sample x3 (ordered)  Follow-up:  - 6 weeks with Dr. Chase Caller

## 2020-01-07 DIAGNOSIS — E039 Hypothyroidism, unspecified: Secondary | ICD-10-CM | POA: Diagnosis not present

## 2020-01-07 DIAGNOSIS — E785 Hyperlipidemia, unspecified: Secondary | ICD-10-CM | POA: Diagnosis not present

## 2020-01-07 DIAGNOSIS — E1169 Type 2 diabetes mellitus with other specified complication: Secondary | ICD-10-CM | POA: Diagnosis not present

## 2020-01-07 DIAGNOSIS — I10 Essential (primary) hypertension: Secondary | ICD-10-CM | POA: Diagnosis not present

## 2020-01-08 DIAGNOSIS — E119 Type 2 diabetes mellitus without complications: Secondary | ICD-10-CM | POA: Diagnosis not present

## 2020-01-08 DIAGNOSIS — D638 Anemia in other chronic diseases classified elsewhere: Secondary | ICD-10-CM | POA: Diagnosis not present

## 2020-01-08 DIAGNOSIS — E440 Moderate protein-calorie malnutrition: Secondary | ICD-10-CM | POA: Diagnosis not present

## 2020-01-08 DIAGNOSIS — E079 Disorder of thyroid, unspecified: Secondary | ICD-10-CM | POA: Diagnosis not present

## 2020-01-08 DIAGNOSIS — I10 Essential (primary) hypertension: Secondary | ICD-10-CM | POA: Diagnosis not present

## 2020-01-08 DIAGNOSIS — K56609 Unspecified intestinal obstruction, unspecified as to partial versus complete obstruction: Secondary | ICD-10-CM | POA: Diagnosis not present

## 2020-01-08 DIAGNOSIS — J690 Pneumonitis due to inhalation of food and vomit: Secondary | ICD-10-CM | POA: Diagnosis not present

## 2020-01-08 DIAGNOSIS — E7800 Pure hypercholesterolemia, unspecified: Secondary | ICD-10-CM | POA: Diagnosis not present

## 2020-01-13 NOTE — Progress Notes (Deleted)
Cardiology Office Note:    Date:  01/13/2020   ID:  Gloria Murphy, DOB 1935-12-01, MRN [id number]  PCP:  Gayland Curry, DO  Cardiologist:  No primary care provider on file.   Referring MD: Gayland Curry, DO   No chief complaint on file.   History of Present Illness:    Gloria Murphy is a 84 y.o. female with a hx of hypertension, hyperlipidemia, type 2 diabetes mellitus, history of ovarian carcinoid tumor, symptoms of angina (negative ischemic w/u), acute visual changes, moderate pulmonary hypertension 56 mmHg, and chronic diastolic HF. Switching over from Dr. Virgina Jock.  ***   Past Medical History:  Diagnosis Date  . Diabetes mellitus without complication ([place])   . High cholesterol   . Hypertension   . Thyroid disease     Past Surgical History:  Procedure Laterality Date  . ABDOMINAL HYSTERECTOMY    . EYE SURGERY Bilateral 06/2016   Cataract  . KNEE SURGERY Right   . LAPAROTOMY N/A 08/31/2017   Procedure: EXPLORATORY LAPAROTOMY; BILATERAL SALPINGOOPHERECTOMY; OMENTECTOMY, TUMOR DEBULKING;  Surgeon: Everitt Amber, MD;  [facility]: [facility];  Service: Gynecology;  Laterality: N/A;  . LAPAROTOMY N/A 07/01/2019   Procedure: EXPLORATORY LAPAROTOMY;  Surgeon: Jovita Kussmaul, MD;  Location: [facility];  Service: General;  Laterality: N/A;  . MASS EXCISION N/A 08/31/2017   Procedure: RESECTION OF PELVIC MASS;  Surgeon: Everitt Amber, MD;  Location: [facility];  Service: Gynecology;  Laterality: N/A;  . RIGHT HEART CATH N/A 02/11/2019   Procedure: RIGHT HEART CATH;  Surgeon: Nigel Mormon, MD;  Location: [facility];  Service: Cardiovascular;  Laterality: N/A;  . SMALL INTESTINE SURGERY      Current Medications: No outpatient medications have been marked as taking for the 01/16/20 encounter (Appointment) with Belva Crome, MD.     Allergies:   Patient has no known allergies.   Social History   Socioeconomic History  . Marital status: Married    Spouse name: Not on  file  . Number of children: 3  . Years of education: 85  . Highest education level: High school graduate  Occupational History  . Occupation: Retired  Tobacco Use  . Smoking status: Never Smoker  . Smokeless tobacco: Never Used  Vaping Use  . Vaping Use: Never used  Substance and Sexual Activity  . Alcohol use: No  . Drug use: No  . Sexual activity: Not on file  Other Topics Concern  . Not on file  Social History Narrative   Lives at home with her husband.   Right-handed.   Occasional caffeine.   Social Determinants of Health   Financial Resource Strain:   . Difficulty of Paying Living Expenses: Not on file  Food Insecurity:   . Worried About Charity fundraiser in the Last Year: Not on file  . Ran Out of Food in the Last Year: Not on file  Transportation Needs:   . Lack of Transportation (Medical): Not on file  . Lack of Transportation (Non-Medical): Not on file  Physical Activity:   . Days of Exercise per Week: Not on file  . Minutes of Exercise per Session: Not on file  Stress:   . Feeling of Stress : Not on file  Social Connections:   . Frequency of Communication with Friends and Family: Not on file  . Frequency of Social Gatherings with Friends and Family: Not on file  . Attends Religious Services: Not on file  .  Active Member of Clubs or Organizations: Not on file  . Attends Archivist Meetings: Not on file  . Marital Status: Not on file     Family History: The patient's family history includes Diabetes in her mother; Heart disease in her mother; Kidney failure in her father. There is no history of Breast cancer.  ROS:   Please see the history of present illness.    *** All other systems reviewed and are negative.  EKGs/Labs/Other Studies Reviewed:    The following studies were reviewed today: ***  EKG:  EKG ***  Recent Labs: 10/01/2019: Magnesium 1.6; TSH 0.032 10/03/2019: ALT 15 11/07/2019: BUN 14; Creatinine, Ser 0.83; Hemoglobin 9.8;  Platelets 294.0; Potassium 4.0; Sodium 132  Recent Lipid Panel    Component Value Date/Time   CHOL  10/31/2007 1600    112        ATP III CLASSIFICATION:  <200     mg/dL   Desirable  200-239  mg/dL   Borderline High  >=240    mg/dL   High   TRIG 75 07/22/2019 0828   HDL 42 10/31/2007 1600   CHOLHDL 2.7 10/31/2007 1600   VLDL 10 10/31/2007 1600   LDLCALC  10/31/2007 1600    60        Total Cholesterol/HDL:CHD Risk Coronary Heart Disease Risk Table                     Men   Women  1/2 Average Risk   3.4   3.3    Physical Exam:    VS:  There were no vitals taken for this visit.    Wt Readings from Last 3 Encounters:  01/06/20 134 lb (60.8 kg)  11/07/19 147 lb 9.6 oz (67 kg)  10/21/19 150 lb (68 kg)     GEN: ***. No acute distress HEENT: Normal NECK: No JVD. LYMPHATICS: No lymphadenopathy CARDIAC: *** RRR without murmur, gallop, or edema. VASCULAR: *** Normal Pulses. No bruits. RESPIRATORY:  Clear to auscultation without rales, wheezing or rhonchi  ABDOMEN: Soft, non-tender, non-distended, No pulsatile mass, MUSCULOSKELETAL: No deformity  SKIN: Warm and dry NEUROLOGIC:  Alert and oriented x 3 PSYCHIATRIC:  Normal affect   ASSESSMENT:    1. PAD (peripheral artery disease) ([place])   2. Essential hypertension   3. Pulmonary hypertension, unspecified ([place])   4. PVC (premature ventricular contraction)   5. Type 2 diabetes mellitus without complication, without long-term current use of insulin (HCC)   6. Educated about COVID-19 virus infection    PLAN:    In order of problems listed above:  1. ***   Medication Adjustments/Labs and Tests Ordered: Current medicines are reviewed at length with the patient today.  Concerns regarding medicines are outlined above.  No orders of the defined types were placed in this encounter.  No orders of the defined types were placed in this encounter.   There are no Patient Instructions on file for this visit.   Signed, Sinclair Grooms, MD  01/13/2020 5:39 PM    [place]

## 2020-01-15 DIAGNOSIS — I10 Essential (primary) hypertension: Secondary | ICD-10-CM | POA: Diagnosis not present

## 2020-01-15 DIAGNOSIS — J690 Pneumonitis due to inhalation of food and vomit: Secondary | ICD-10-CM | POA: Diagnosis not present

## 2020-01-15 DIAGNOSIS — E440 Moderate protein-calorie malnutrition: Secondary | ICD-10-CM | POA: Diagnosis not present

## 2020-01-15 DIAGNOSIS — K56609 Unspecified intestinal obstruction, unspecified as to partial versus complete obstruction: Secondary | ICD-10-CM | POA: Diagnosis not present

## 2020-01-15 DIAGNOSIS — E7800 Pure hypercholesterolemia, unspecified: Secondary | ICD-10-CM | POA: Diagnosis not present

## 2020-01-15 DIAGNOSIS — E079 Disorder of thyroid, unspecified: Secondary | ICD-10-CM | POA: Diagnosis not present

## 2020-01-15 DIAGNOSIS — E119 Type 2 diabetes mellitus without complications: Secondary | ICD-10-CM | POA: Diagnosis not present

## 2020-01-15 DIAGNOSIS — D638 Anemia in other chronic diseases classified elsewhere: Secondary | ICD-10-CM | POA: Diagnosis not present

## 2020-01-16 ENCOUNTER — Ambulatory Visit: Payer: Medicare Other | Admitting: Interventional Cardiology

## 2020-01-22 DIAGNOSIS — D638 Anemia in other chronic diseases classified elsewhere: Secondary | ICD-10-CM | POA: Diagnosis not present

## 2020-01-22 DIAGNOSIS — E119 Type 2 diabetes mellitus without complications: Secondary | ICD-10-CM | POA: Diagnosis not present

## 2020-01-22 DIAGNOSIS — K56609 Unspecified intestinal obstruction, unspecified as to partial versus complete obstruction: Secondary | ICD-10-CM | POA: Diagnosis not present

## 2020-01-22 DIAGNOSIS — J690 Pneumonitis due to inhalation of food and vomit: Secondary | ICD-10-CM | POA: Diagnosis not present

## 2020-01-22 DIAGNOSIS — E7800 Pure hypercholesterolemia, unspecified: Secondary | ICD-10-CM | POA: Diagnosis not present

## 2020-01-22 DIAGNOSIS — E440 Moderate protein-calorie malnutrition: Secondary | ICD-10-CM | POA: Diagnosis not present

## 2020-01-22 DIAGNOSIS — E079 Disorder of thyroid, unspecified: Secondary | ICD-10-CM | POA: Diagnosis not present

## 2020-01-22 DIAGNOSIS — I10 Essential (primary) hypertension: Secondary | ICD-10-CM | POA: Diagnosis not present

## 2020-01-23 ENCOUNTER — Telehealth: Payer: Medicare Other | Admitting: Nurse Practitioner

## 2020-01-23 ENCOUNTER — Other Ambulatory Visit: Payer: Self-pay

## 2020-01-23 ENCOUNTER — Telehealth (INDEPENDENT_AMBULATORY_CARE_PROVIDER_SITE_OTHER): Payer: Medicare Other | Admitting: Nurse Practitioner

## 2020-01-23 ENCOUNTER — Telehealth: Payer: Self-pay

## 2020-01-23 DIAGNOSIS — R1312 Dysphagia, oropharyngeal phase: Secondary | ICD-10-CM | POA: Diagnosis not present

## 2020-01-23 DIAGNOSIS — R630 Anorexia: Secondary | ICD-10-CM | POA: Diagnosis not present

## 2020-01-23 DIAGNOSIS — G301 Alzheimer's disease with late onset: Secondary | ICD-10-CM

## 2020-01-23 DIAGNOSIS — R790 Abnormal level of blood mineral: Secondary | ICD-10-CM

## 2020-01-23 DIAGNOSIS — R410 Disorientation, unspecified: Secondary | ICD-10-CM

## 2020-01-23 DIAGNOSIS — R7989 Other specified abnormal findings of blood chemistry: Secondary | ICD-10-CM

## 2020-01-23 DIAGNOSIS — I5032 Chronic diastolic (congestive) heart failure: Secondary | ICD-10-CM

## 2020-01-23 DIAGNOSIS — E119 Type 2 diabetes mellitus without complications: Secondary | ICD-10-CM

## 2020-01-23 DIAGNOSIS — F0280 Dementia in other diseases classified elsewhere without behavioral disturbance: Secondary | ICD-10-CM

## 2020-01-23 MED ORDER — UNABLE TO FIND: 0 refills | Status: AC

## 2020-01-23 NOTE — Progress Notes (Signed)
Careteam: Patient Care Team: Gayland Curry, DO as PCP - General (Geriatric Medicine) Marcial Pacas, MD as Consulting Physician (Neurology) Nigel Mormon, MD as Consulting Physician (Cardiology) Everitt Amber, MD as Consulting Physician (Gynecologic Oncology) Juanita Craver, MD as Consulting Physician (Gastroenterology)  Advanced Directive information    No Known Allergies  Chief Complaint  Patient presents with  . Acute Visit    Patient complains of weakness and decrease in appetite. Patient's daughter states she has lost about 10 pounds since September 1. Some diarrhea, husband cut back on magnesium oxide dose, seems to have worked some. No nausea or vomiting, Complains of stomacah hurting from time to time. Disoriented , thinks may be due to dementia progression.Has concenrs about Lasix and whether it is still needed or not.     HPI: Patient is a 84 y.o. female due to acute confusion, decrease appetite.  Pt with hx of FTT, protein calorie malnutrition, dementia, CHF, DM, HTN and dysphagia. Followed by palliative care.  Reports daughter has noticed she has had some increase RR, cough. Husband reports breathing has been stable over the last month. She lives out of town but has been here the last week.  She has had a cough since May Not a lot of congestion but with nasal drip She saw pulmonary back on 01/06/20 and they wanted her to collect sputum sample but she has not had significant sputum to collect sample.   No fevers or chills but weakness and malaise  Increase in confusion, not sure where she is.  No appetite. Not eating much even when offered.  No coughing or trouble swallowing when eating.   She was placed on magnesium supplement due to low magnesium during hospitalization.   She has had a hx of weight loss and loss of appetite.  She was previously on megace which was very effective but due adverse risk it was stopped. She was started on remeron but it was not  effective so stopped.   Her daughter has held lasix over the last 2 days, was started due to LE edema which she does not have.  Has had minimal PO intake.   Previously on thyroid medication and seeing Dr Criss Rosales due to this however after recent hospitalization all thyroid medication was stopped.    Review of Systems:  Review of Systems  Unable to perform ROS: Dementia    Past Medical History:  Diagnosis Date  . Diabetes mellitus without complication ([place])   . High cholesterol   . Hypertension   . Thyroid disease    Past Surgical History:  Procedure Laterality Date  . ABDOMINAL HYSTERECTOMY    . EYE SURGERY Bilateral 06/2016   Cataract  . KNEE SURGERY Right   . LAPAROTOMY N/A 08/31/2017   Procedure: EXPLORATORY LAPAROTOMY; BILATERAL SALPINGOOPHERECTOMY; OMENTECTOMY, TUMOR DEBULKING;  Surgeon: Everitt Amber, MD;  [facility];  Service: Gynecology;  Laterality: N/A;  . LAPAROTOMY N/A 07/01/2019   Procedure: EXPLORATORY LAPAROTOMY;  Surgeon: Jovita Kussmaul, MD;  [facility];  Service: General;  Laterality: N/A;  . MASS EXCISION N/A 08/31/2017   Procedure: RESECTION OF PELVIC MASS;  Surgeon: Everitt Amber, MD;  [facility];  Service: Gynecology;  Laterality: N/A;  . RIGHT HEART CATH N/A 02/11/2019   Procedure: RIGHT HEART CATH;  Surgeon: Nigel Mormon, MD;  Location: [facility];  Service: Cardiovascular;  Laterality: N/A;  . SMALL INTESTINE SURGERY     Social History:   reports that she  has never smoked. She has never used smokeless tobacco. She reports that she does not drink alcohol and does not use drugs.  Family History  Problem Relation Age of Onset  . Heart disease Mother   . Diabetes Mother   . Kidney failure Father   . Breast cancer Neg Hx     Medications: Patient's Medications  New Prescriptions   No medications on file  Previous Medications   AMLODIPINE (NORVASC) 5 MG TABLET    Take 5 mg by mouth daily.   ASPIRIN EC 81 MG TABLET    Take 1  tablet (81 mg total) by mouth daily.   CALCIUM CARB-CHOLECALCIFEROL (CALTRATE 600+D3) 600-800 MG-UNIT TABS    Take one tablet by mouth once daily.   CARBOXYMETHYLCELLULOSE (REFRESH TEARS) 0.5 % SOLN    Place 1 drop into both eyes 3 (three) times daily.   CARVEDILOL (COREG) 6.25 MG TABLET    TAKE 1 TABLET(6.25 MG) BY MOUTH TWICE DAILY WITH A MEAL   CHLORHEXIDINE (PERIDEX) 0.12 % SOLUTION    Use as directed 15 mLs in the mouth or throat daily.   FAMOTIDINE (PEPCID) 20 MG TABLET    Take 20 mg by mouth as needed.    FUROSEMIDE (LASIX) 20 MG TABLET    Take 20 mg by mouth. Take 1/2 pill a day 10mg    GLUCOSE BLOOD TEST STRIP    Use to test blood sugar daily. Dx: E11.9   GUAIFENESIN (ROBITUSSIN) 100 MG/5ML SOLN    Take 5 mLs (100 mg total) by mouth every 4 (four) hours as needed for cough or to loosen phlegm.   LANCETS (ACCU-CHEK SOFT TOUCH) LANCETS    Use to test blood sugar daily. Dx: E11.9   MAGNESIUM OXIDE PO    Take 1 tablet by mouth daily.    POTASSIUM CHLORIDE SA (KLOR-CON) 20 MEQ TABLET    Take 1 tablet (20 mEq total) by mouth daily.  Modified Medications   No medications on file  Discontinued Medications   MEGESTROL (MEGACE ES) 625 MG/5ML SUSPENSION    Take 625 mg by mouth daily.   MIRTAZAPINE (REMERON) 7.5 MG TABLET    Take 1 tablet (7.5 mg total) by mouth at bedtime.   POLYETHYLENE GLYCOL (MIRALAX / GLYCOLAX) 17 G PACKET    Take 17 g by mouth as needed.    Physical Exam:  There were no vitals filed for this visit. There is no height or weight on file to calculate BMI. Wt Readings from Last 3 Encounters:  01/06/20 134 lb (60.8 kg)  11/07/19 147 lb 9.6 oz (67 kg)  10/21/19 150 lb (68 kg)      Labs reviewed: Basic Metabolic Panel: Recent Labs    07/07/19 1000 07/08/19 0500 07/18/19 1445 07/19/19 0500 07/20/19 0443 07/20/19 0443 07/21/19 2333 07/21/19 2333 07/22/19 0828 09/30/19 0025 10/01/19 1130 10/01/19 1245 10/01/19 1953 10/02/19 0212 10/03/19 1539 11/07/19 1449    NA  --    < >  --    < > 134*   < > 135   < > 135   < > 137  --    < > 138 137 132*  K  --    < >  --    < > 3.7   < > 3.5   < > 3.6   < > 2.6*  --    < > 4.0 3.9 4.0  CL  --    < >  --    < >  103   < > 105   < > 106   < > 109  --    < > 109 103 98  CO2  --    < >  --    < > 22   < > 22   < > 21*   < > 20*  --    < > 20* 26 27  GLUCOSE  --    < >  --    < > 151*   < > 103*   < > 115*   < > 125*  --    < > 111* 118 101*  BUN  --    < >  --    < > 25*   < > 23   < > 21   < > 5*  --    < > 6* 13 14  CREATININE  --    < >  --    < > 0.69   < > 0.58   < > 0.62   < > 0.63  --    < > 0.58 0.69 0.83  CALCIUM  --    < >  --    < > 8.7*   < > 8.5*   < > 8.6*   < > 8.5*  --    < > 9.0 9.3 9.6  MG  --    < >  --    < > 2.0   < > 1.9  --  1.6*  --   --  1.6*  --   --   --   --   PHOS  --    < >  --    < > 3.1  --  2.8  --  2.8  --   --   --   --   --   --   --   TSH 0.064*  --  0.031*  --   --   --   --   --   --   --  0.032*  --   --   --   --   --    < > = values in this interval not displayed.   Liver Function Tests: Recent Labs    07/22/19 0828 07/22/19 0828 09/30/19 0025 09/30/19 0435 10/03/19 1539  AST 51*   < > 26 23 34  ALT 63*   < > 16 13 15   ALKPHOS 322*  --  77 66  --   BILITOT 0.5   < > 0.5 0.6 0.3  PROT 7.1   < > 7.6 7.1 7.1  ALBUMIN 2.0*  --  2.5* 2.3*  --    < > = values in this interval not displayed.   Recent Labs    06/26/19 1352  LIPASE 38   Recent Labs    07/13/19 1600  AMMONIA 16   CBC: Recent Labs    10/02/19 0212 10/03/19 1539 11/07/19 1449  WBC 4.3 5.3 5.8  NEUTROABS 2.7 3,599 3.8  HGB 10.4* 11.1* 9.8*  HCT 31.9* 34.0* 29.0*  MCV 79.6* 77.3* 76.3*  PLT 268 368 294.0   Lipid Panel: Recent Labs    07/15/19 0430 07/21/19 2333 07/22/19 0828  TRIG 77 66 75   TSH: Recent Labs    07/07/19 1000 07/18/19 1445 10/01/19 1130  TSH 0.064* 0.031* 0.032*   A1C: Lab Results  Component Value Date   HGBA1C 6.3 (H) 06/27/2019  Assessment/Plan 1. Loss of appetite -ongoing issue but now with worsening weight loss. Likely due to progression of dementia but also possibly of acute cause. She had chest xray 2 weeks ago without acute changes. Will obtain labs through home health.  2. Late onset Alzheimer's disease without behavioral disturbance ([place]) Progressive decline with decrease in oral intake, increase confusion which could be due to acute cause, will get labs to evaluate this at this time. Hx of aspiration pneumonia and following with pulmonary for chronic cough. VSS have been stable per home health nursing. Continues with supportive care with family   3. Dysphagia, oropharyngeal Continue precautions at risk for aspiration which could be cause of acute confusion. Daughter wants to hold off on xray at this time, will get CBC with diff, if showing abnormal wbc would want to update chest xray   4. Type 2 diabetes mellitus without complication, without long-term current use of insulin (HCC) -blood sugar was stable when they have monitored, no signs of hypoglycemia not on any diabetic medications.  5. Chronic diastolic heart failure (HCC) Will hold lasix at this time due to decrease in oral intake. To monitor for increase in edema, worsening shortness of breath, congestion, cough.  6. Delirium With hx of dementia, Progressive decline with decrease in oral intake with significant weight loss, increase confusion which could be due to acute cause, will get labs to evaluate this at this time. Hx of aspiration pneumonia and following with pulmonary for chronic cough. VSS have been stable per home health nursing. Depending on labs may need further work up with [facility] or chest xray due to his of aspiration pneumonia.   7. Low magnesium level -on mag due to this however was having diarrhea. Daughter has held magnesium and diarrhea improved. Plans to give it every other day and monitor. Will hold for loose stools/diarrhea;  will follow up mag level.   8. Low TSH level Noted low TSH on last labs, previously on medication for thyroid per daughter, will follow up TSH.  Will get home health nursing to obtain tsh, mag level, cbc with diff and cmp at this time.   Carlos American. Harle Battiest  [facility] [phone]    Virtual Visit via Deloris Ping  I connected with patient on 01/23/20 at  3:45 PM EDT by telephone and verified that I am speaking with the correct person using two identifiers.  Location: Patient: home Provider: [facility]   I discussed the limitations, risks, security and privacy concerns of performing an evaluation and management service by telephone and the availability of in person appointments. I also discussed with the patient that there may be a patient responsible charge related to this service. The patient expressed understanding and agreed to proceed.   I discussed the assessment and treatment plan with the patient. The patient was provided an opportunity to ask questions and all were answered. The patient agreed with the plan and demonstrated an understanding of the instructions.   The patient was advised to call back or seek an in-person evaluation if the symptoms worsen or if the condition fails to improve as anticipated.  I provided 25 minutes of non-face-to-face time during this encounter.  Carlos American. Harle Battiest Avs printed and mailed

## 2020-01-23 NOTE — Telephone Encounter (Signed)
Ms. vicky, schleich are scheduled for a virtual visit with your provider today.    Just as we do with appointments in the office, we must obtain your consent to participate.  Your consent will be active for this visit and any virtual visit you may have with one of our providers in the next 365 days.    If you have a MyChart account, I can also send a copy of this consent to you electronically.  All virtual visits are billed to your insurance company just like a traditional visit in the office.  As this is a virtual visit, video technology does not allow for your provider to perform a traditional examination.  This may limit your provider's ability to fully assess your condition.  If your provider identifies any concerns that need to be evaluated in person or the need to arrange testing such as labs, EKG, etc, we will make arrangements to do so.    Although advances in technology are sophisticated, we cannot ensure that it will always work on either your end or our end.  If the connection with a video visit is poor, we may have to switch to a telephone visit.  With either a video or telephone visit, we are not always able to ensure that we have a secure connection.   I need to obtain your verbal consent now.   Are you willing to proceed with your visit today?   [name], daughter Tonia,has provided verbal consent on 01/23/2020 for a virtual visit (video or telephone).   Carroll Kinds, [facility] 01/23/2020  2:29 PM

## 2020-01-23 NOTE — Progress Notes (Signed)
This service is provided via telemedicine  No vital signs collected/recorded due to the encounter was a telemedicine visit.   Location of patient (ex: home, work):  Home  Patient consents to a telephone visit:  Yes,see encounter dated 01/23/2020  Location of the provider (ex: office, home):  [place]  Name of any referring provider:  Mariea Clonts, [employer]  Names of all persons participating in the telemedicine service and their role in the encounter:  Sherrie Mustache, Nurse Practitioner, Carroll Kinds, CMA, patient's daughter Lauro Regulus  Time spent on call:  10 minutes with medical assistant

## 2020-01-25 ENCOUNTER — Other Ambulatory Visit: Payer: Self-pay | Admitting: Family

## 2020-01-25 DIAGNOSIS — R0609 Other forms of dyspnea: Secondary | ICD-10-CM

## 2020-01-25 DIAGNOSIS — R600 Localized edema: Secondary | ICD-10-CM

## 2020-01-26 ENCOUNTER — Other Ambulatory Visit (HOSPITAL_COMMUNITY)
Admission: RE | Admit: 2020-01-26 | Discharge: 2020-01-26 | Disposition: A | Discharge status: Home / Self Care | Payer: Medicare Other | Source: Other Acute Inpatient Hospital | Attending: Family Medicine | Admitting: Family Medicine

## 2020-01-26 ENCOUNTER — Encounter: Payer: Self-pay | Admitting: Internal Medicine

## 2020-01-26 DIAGNOSIS — E119 Type 2 diabetes mellitus without complications: Secondary | ICD-10-CM | POA: Diagnosis not present

## 2020-01-26 DIAGNOSIS — I10 Essential (primary) hypertension: Secondary | ICD-10-CM | POA: Diagnosis not present

## 2020-01-26 DIAGNOSIS — E440 Moderate protein-calorie malnutrition: Secondary | ICD-10-CM | POA: Diagnosis not present

## 2020-01-26 DIAGNOSIS — F0280 Dementia in other diseases classified elsewhere without behavioral disturbance: Secondary | ICD-10-CM

## 2020-01-26 DIAGNOSIS — E079 Disorder of thyroid, unspecified: Secondary | ICD-10-CM | POA: Diagnosis not present

## 2020-01-26 DIAGNOSIS — E7800 Pure hypercholesterolemia, unspecified: Secondary | ICD-10-CM | POA: Diagnosis not present

## 2020-01-26 DIAGNOSIS — J690 Pneumonitis due to inhalation of food and vomit: Secondary | ICD-10-CM | POA: Insufficient documentation

## 2020-01-26 DIAGNOSIS — D638 Anemia in other chronic diseases classified elsewhere: Secondary | ICD-10-CM | POA: Diagnosis not present

## 2020-01-26 DIAGNOSIS — R630 Anorexia: Secondary | ICD-10-CM

## 2020-01-26 DIAGNOSIS — R1312 Dysphagia, oropharyngeal phase: Secondary | ICD-10-CM

## 2020-01-26 DIAGNOSIS — K56609 Unspecified intestinal obstruction, unspecified as to partial versus complete obstruction: Secondary | ICD-10-CM | POA: Diagnosis not present

## 2020-01-26 DIAGNOSIS — G301 Alzheimer's disease with late onset: Secondary | ICD-10-CM

## 2020-01-26 DIAGNOSIS — R627 Adult failure to thrive: Secondary | ICD-10-CM

## 2020-01-26 DIAGNOSIS — R531 Weakness: Secondary | ICD-10-CM

## 2020-01-26 LAB — CBC WITH DIFFERENTIAL/PLATELET
Abs Immature Granulocytes: 0.05 10*3/uL (ref 0.00–0.07)
Basophils Absolute: 0 10*3/uL (ref 0.0–0.1)
Basophils Relative: 0 %
Eosinophils Absolute: 0 10*3/uL (ref 0.0–0.5)
Eosinophils Relative: 0 %
HCT: 32.5 % — ABNORMAL LOW (ref 36.0–46.0)
Hemoglobin: 10.3 g/dL — ABNORMAL LOW (ref 12.0–15.0)
Immature Granulocytes: 1 %
Lymphocytes Relative: 11 %
Lymphs Abs: 0.7 10*3/uL (ref 0.7–4.0)
MCH: 23.9 pg — ABNORMAL LOW (ref 26.0–34.0)
MCHC: 31.7 g/dL (ref 30.0–36.0)
MCV: 75.4 fL — ABNORMAL LOW (ref 80.0–100.0)
Monocytes Absolute: 0.7 10*3/uL (ref 0.1–1.0)
Monocytes Relative: 10 %
Neutro Abs: 5.2 10*3/uL (ref 1.7–7.7)
Neutrophils Relative %: 78 %
Platelets: 249 10*3/uL (ref 150–400)
RBC: 4.31 MIL/uL (ref 3.87–5.11)
RDW: 15.9 % — ABNORMAL HIGH (ref 11.5–15.5)
WBC: 6.7 10*3/uL (ref 4.0–10.5)
nRBC: 0 % (ref 0.0–0.2)

## 2020-01-26 LAB — COMPREHENSIVE METABOLIC PANEL
ALT: 15 U/L (ref 0–44)
AST: 26 U/L (ref 15–41)
Albumin: 2.7 g/dL — ABNORMAL LOW (ref 3.5–5.0)
Alkaline Phosphatase: 83 U/L (ref 38–126)
Anion gap: 11 (ref 5–15)
BUN: 20 mg/dL (ref 8–23)
CO2: 24 mmol/L (ref 22–32)
Calcium: 10 mg/dL (ref 8.9–10.3)
Chloride: 103 mmol/L (ref 98–111)
Creatinine, Ser: 0.82 mg/dL (ref 0.44–1.00)
GFR calc Af Amer: >60 mL/min (ref >60)
GFR calc non Af Amer: >60 mL/min (ref >60)
Glucose, Bld: 106 mg/dL — ABNORMAL HIGH (ref 70–99)
Potassium: 3.5 mmol/L (ref 3.5–5.1)
Sodium: 138 mmol/L (ref 135–145)
Total Bilirubin: 0.8 mg/dL (ref 0.3–1.2)
Total Protein: 8.5 g/dL — ABNORMAL HIGH (ref 6.5–8.1)

## 2020-01-26 LAB — MAGNESIUM: Magnesium: 1.7 mg/dL (ref 1.7–2.4)

## 2020-01-27 ENCOUNTER — Other Ambulatory Visit: Payer: Medicare Other

## 2020-01-27 ENCOUNTER — Telehealth: Payer: Self-pay

## 2020-01-27 ENCOUNTER — Encounter: Payer: Self-pay | Admitting: Internal Medicine

## 2020-01-27 DIAGNOSIS — J180 Bronchopneumonia, unspecified organism: Secondary | ICD-10-CM

## 2020-01-27 NOTE — Telephone Encounter (Signed)
Ok, depending on how she's doing next Thursday at her appt, we may draw those here.

## 2020-01-27 NOTE — Telephone Encounter (Signed)
Forestine Na did the blood work on yesterday, I called the lab to have the TSH and a Free T4. It could not be added because it was not drawn in a Gold tube.

## 2020-01-27 NOTE — Progress Notes (Signed)
I have already responded to the [place] notes

## 2020-01-27 NOTE — Progress Notes (Signed)
Electrolytes and kidneys ok this time Magnesium was normal now Blood count improved a little from last time. We can discuss all of these at her appt.

## 2020-01-30 ENCOUNTER — Encounter: Payer: Self-pay | Admitting: Internal Medicine

## 2020-01-30 DIAGNOSIS — I10 Essential (primary) hypertension: Secondary | ICD-10-CM | POA: Diagnosis not present

## 2020-01-30 DIAGNOSIS — E079 Disorder of thyroid, unspecified: Secondary | ICD-10-CM | POA: Diagnosis not present

## 2020-01-30 DIAGNOSIS — E119 Type 2 diabetes mellitus without complications: Secondary | ICD-10-CM | POA: Diagnosis not present

## 2020-01-30 DIAGNOSIS — E7800 Pure hypercholesterolemia, unspecified: Secondary | ICD-10-CM | POA: Diagnosis not present

## 2020-01-30 DIAGNOSIS — K56609 Unspecified intestinal obstruction, unspecified as to partial versus complete obstruction: Secondary | ICD-10-CM | POA: Diagnosis not present

## 2020-01-30 DIAGNOSIS — E440 Moderate protein-calorie malnutrition: Secondary | ICD-10-CM | POA: Diagnosis not present

## 2020-01-30 DIAGNOSIS — J690 Pneumonitis due to inhalation of food and vomit: Secondary | ICD-10-CM | POA: Diagnosis not present

## 2020-01-30 DIAGNOSIS — D638 Anemia in other chronic diseases classified elsewhere: Secondary | ICD-10-CM | POA: Diagnosis not present

## 2020-02-03 DIAGNOSIS — K56609 Unspecified intestinal obstruction, unspecified as to partial versus complete obstruction: Secondary | ICD-10-CM | POA: Diagnosis not present

## 2020-02-03 DIAGNOSIS — G9341 Metabolic encephalopathy: Secondary | ICD-10-CM | POA: Diagnosis not present

## 2020-02-03 DIAGNOSIS — R2689 Other abnormalities of gait and mobility: Secondary | ICD-10-CM | POA: Diagnosis not present

## 2020-02-03 DIAGNOSIS — C7A00 Malignant carcinoid tumor of unspecified site: Secondary | ICD-10-CM | POA: Diagnosis not present

## 2020-02-03 DIAGNOSIS — Z48815 Encounter for surgical aftercare following surgery on the digestive system: Secondary | ICD-10-CM | POA: Diagnosis not present

## 2020-02-06 ENCOUNTER — Encounter: Payer: Self-pay | Admitting: Internal Medicine

## 2020-02-06 ENCOUNTER — Ambulatory Visit (INDEPENDENT_AMBULATORY_CARE_PROVIDER_SITE_OTHER): Payer: Medicare Other | Admitting: Internal Medicine

## 2020-02-06 ENCOUNTER — Other Ambulatory Visit: Payer: Self-pay

## 2020-02-06 VITALS — BP 114/64 | HR 76 | Temp 96.2°F | Resp 16 | Ht 69.0 in | Wt 120.0 lb

## 2020-02-06 DIAGNOSIS — J189 Pneumonia, unspecified organism: Secondary | ICD-10-CM

## 2020-02-06 DIAGNOSIS — R627 Adult failure to thrive: Secondary | ICD-10-CM

## 2020-02-06 DIAGNOSIS — G301 Alzheimer's disease with late onset: Secondary | ICD-10-CM

## 2020-02-06 DIAGNOSIS — R630 Anorexia: Secondary | ICD-10-CM | POA: Diagnosis not present

## 2020-02-06 DIAGNOSIS — R531 Weakness: Secondary | ICD-10-CM

## 2020-02-06 DIAGNOSIS — Z23 Encounter for immunization: Secondary | ICD-10-CM

## 2020-02-06 DIAGNOSIS — R1312 Dysphagia, oropharyngeal phase: Secondary | ICD-10-CM

## 2020-02-06 DIAGNOSIS — E876 Hypokalemia: Secondary | ICD-10-CM | POA: Diagnosis not present

## 2020-02-06 DIAGNOSIS — R7989 Other specified abnormal findings of blood chemistry: Secondary | ICD-10-CM | POA: Diagnosis not present

## 2020-02-06 DIAGNOSIS — F0280 Dementia in other diseases classified elsewhere without behavioral disturbance: Secondary | ICD-10-CM

## 2020-02-06 MED ORDER — POTASSIUM CHLORIDE 20 MEQ/15ML (10%) PO SOLN: 20.0000 meq | Freq: Every day | ORAL | 0 refills | Status: AC

## 2020-02-06 NOTE — Progress Notes (Signed)
Location:  [facility] clinic Provider: Mithcell Schumpert L. Mariea Clonts, D.O., C.M.D.  Code Status: DNR, on HOSPICE Goals of Care:  Advanced Directives 10/21/2019  Does Patient Have a Medical Advance Directive? Yes  Type of Paramedic of [place];Living will  Does patient want to make changes to medical advance directive? No - Patient declined  Copy of [place] in Chart? Yes - validated most recent copy scanned in chart (See row information)  Would patient like information on creating a medical advance directive? -     Chief Complaint  Patient presents with  . Acute Visit    Weight Loss Concerns, and loss of appetite.    HPI: Patient is a 84 y.o. female seen today for an acute visit for  Chest congestion has gone away with the diabetatussin.  With saline and flonase, she's not coughing much.  They took a sputum sample to pulmonary.  When she gets cold, she coughs.  T103.3 a couple of days ago--given tylenol which broke the fever.  AFB culture pending.    Hospice started last week.  They come in two days a week for showers.  They are looking for in-home care for washing, bathing, housekeeping--her husband is getting worn out.    Rectum hurts:  Hurts when she has a bm.  3 loose already today    legs were hurting.  May have been positional in bed.   2 bp meds--amlodipine and coreg.  Bps on low end since summer.  Will stop amlodipine.    What is her overall condition?  Her dementia has been progressing.  She got up and walked down the hall last night.  Husband found her sitting in a chair.    She sleeps in a high bed.  Needs hospital bed.  Her husband sent a hospital bed back.  Past Medical History:  Diagnosis Date  . Diabetes mellitus without complication ([place])   . High cholesterol   . Hypertension   . Thyroid disease     Past Surgical History:  Procedure Laterality Date  . ABDOMINAL HYSTERECTOMY    . EYE SURGERY Bilateral 06/2016   Cataract  .  KNEE SURGERY Right   . LAPAROTOMY N/A 08/31/2017   Procedure: EXPLORATORY LAPAROTOMY; BILATERAL SALPINGOOPHERECTOMY; OMENTECTOMY, TUMOR DEBULKING;  Surgeon: Everitt Amber, MD;  Location: [facility];  Service: Gynecology;  Laterality: N/A;  . LAPAROTOMY N/A 07/01/2019   Procedure: EXPLORATORY LAPAROTOMY;  Surgeon: Jovita Kussmaul, MD;  Location: [facility];  Service: General;  Laterality: N/A;  . MASS EXCISION N/A 08/31/2017   Procedure: RESECTION OF PELVIC MASS;  Surgeon: Everitt Amber, MD;  Location: [facility];  Service: Gynecology;  Laterality: N/A;  . RIGHT HEART CATH N/A 02/11/2019   Procedure: RIGHT HEART CATH;  Surgeon: Nigel Mormon, MD;  Location: [facility];  Service: Cardiovascular;  Laterality: N/A;  . SMALL INTESTINE SURGERY      No Known Allergies  Outpatient Encounter Medications as of 02/06/2020  Medication Sig  . amLODipine (NORVASC) 5 MG tablet Take 5 mg by mouth daily.  [name] aspirin EC 81 MG tablet Take 1 tablet (81 mg total) by mouth daily.  . Calcium Carb-Cholecalciferol (CALTRATE 600+D3) 600-800 MG-UNIT TABS Take one tablet by mouth once daily.  . carboxymethylcellulose (REFRESH TEARS) 0.5 % SOLN Place 1 drop into both eyes 3 (three) times daily.  . carvedilol (COREG) 6.25 MG tablet TAKE 1 TABLET(6.25 MG) BY MOUTH TWICE DAILY WITH A MEAL  . chlorhexidine ([place])  0.12 % solution Use as directed 15 mLs in the mouth or throat daily.  . famotidine (PEPCID) 20 MG tablet Take 20 mg by mouth as needed.   . furosemide (LASIX) 20 MG tablet Take 20 mg by mouth. Take 1/2 pill a day 10mg   . glucose blood test strip Use to test blood sugar daily. Dx: E11.9  . guaiFENesin (ROBITUSSIN) 100 MG/5ML SOLN Take 5 mLs (100 mg total) by mouth every 4 (four) hours as needed for cough or to loosen phlegm.  . Lancets (ACCU-CHEK SOFT TOUCH) lancets Use to test blood sugar daily. Dx: E11.9  . MAGNESIUM OXIDE PO Take 1 tablet by mouth daily.   . potassium chloride SA (KLOR-CON) 20 MEQ tablet TAKE 1  TABLET(20 MEQ) BY MOUTH DAILY  . UNABLE TO FIND Please obtain labs ASAP Magnesium,DX R78.0, CBCD, CMP, DX R41.0, and TSH, DX R79.89. Fax results to [phone]   No facility-administered encounter medications on file as of 02/06/2020.    Review of Systems:  Review of Systems  Constitutional: Positive for malaise/fatigue and weight loss. Negative for chills and fever.  HENT: Negative for congestion.   Eyes: Negative for blurred vision.  Respiratory: Positive for shortness of breath. Negative for cough and sputum production.   Cardiovascular: Negative for chest pain, palpitations and leg swelling.  Gastrointestinal: Positive for diarrhea. Negative for abdominal pain, blood in stool and constipation.  Genitourinary: Negative for dysuria.  Musculoskeletal: Negative for falls.  Neurological: Positive for weakness. Negative for dizziness and loss of consciousness.  Psychiatric/Behavioral: Positive for memory loss. Negative for depression. The patient is not nervous/anxious and does not have insomnia.     Health Maintenance  Topic Date Due  . URINE MICROALBUMIN  Never done  . TETANUS/TDAP  Never done  . DEXA SCAN  Never done  . PNA vac Low Risk Adult (1 of 2 - PCV13) Never done  . INFLUENZA VACCINE  12/08/2019  . HEMOGLOBIN A1C  12/25/2019  . FOOT EXAM  08/29/2020  . OPHTHALMOLOGY EXAM  10/23/2020  . COVID-19 Vaccine  Completed    Physical Exam: Vitals:   02/06/20 1144  BP: 114/64  Pulse: 76  Resp: 16  Temp: (!) 96.2 F (35.7 C)  SpO2: 98%  Weight: 120 lb (54.4 kg)  Height: 5\' 9"  (1.753 m)   Body mass index is 17.72 kg/m. Physical Exam Vitals reviewed.  Constitutional:      General: She is not in acute distress.    Appearance: She is ill-appearing. She is not toxic-appearing.     Comments: Cachectic female  HENT:     Head: Normocephalic and atraumatic.  Eyes:     Extraocular Movements: Extraocular movements intact.     Pupils: Pupils are equal, round, and reactive to  light.  Cardiovascular:     Rate and Rhythm: Normal rate and regular rhythm.     Pulses: Normal pulses.     Heart sounds: Normal heart sounds.  Pulmonary:     Breath sounds: Normal breath sounds. No rhonchi.     Comments: Appears dyspneic with minimal exertion Abdominal:     General: Bowel sounds are normal.     Palpations: Abdomen is soft.     Tenderness: There is no abdominal tenderness.  Genitourinary:    Comments: External hemorrhoids present, no visible pressure injuries Musculoskeletal:        General: Normal range of motion.     Right lower leg: No edema.     Left lower leg: No edema.  Skin:    General: Skin is warm and dry.  Neurological:     General: No focal deficit present.     Mental Status: She is alert.     Motor: Weakness present.     Gait: Gait abnormal.     Comments: Short-term memory worse, not focused and wanting to get up and wander off; required help to stand today and pull her pants down for exam  Psychiatric:        Mood and Affect: Mood normal.     Labs reviewed: Basic Metabolic Panel: Recent Labs    07/07/19 1000 07/08/19 0500 07/18/19 1445 07/19/19 0500 07/20/19 0443 07/20/19 0443 07/21/19 2333 07/21/19 2333 07/22/19 0828 09/30/19 0025 10/01/19 1130 10/01/19 1245 10/01/19 1953 10/03/19 1539 11/07/19 1449 01/26/20 1356  NA  --    < >  --    < > 134*   < > 135   < > 135   < > 137  --    < > 137 132* 138  K  --    < >  --    < > 3.7   < > 3.5   < > 3.6   < > 2.6*  --    < > 3.9 4.0 3.5  CL  --    < >  --    < > 103   < > 105   < > 106   < > 109  --    < > 103 98 103  CO2  --    < >  --    < > 22   < > 22   < > 21*   < > 20*  --    < > 26 27 24   GLUCOSE  --    < >  --    < > 151*   < > 103*   < > 115*   < > 125*  --    < > 118 101* 106*  BUN  --    < >  --    < > 25*   < > 23   < > 21   < > 5*  --    < > 13 14 20   CREATININE  --    < >  --    < > 0.69   < > 0.58   < > 0.62   < > 0.63  --    < > 0.69 0.83 0.82  CALCIUM  --    < >  --    <  > 8.7*   < > 8.5*   < > 8.6*   < > 8.5*  --    < > 9.3 9.6 10.0  MG  --    < >  --    < > 2.0   < > 1.9   < > 1.6*  --   --  1.6*  --   --   --  1.7  PHOS  --    < >  --    < > 3.1  --  2.8  --  2.8  --   --   --   --   --   --   --   TSH 0.064*  --  0.031*  --   --   --   --   --   --   --  0.032*  --   --   --   --   --    < > =  values in this interval not displayed.   Liver Function Tests: Recent Labs    09/30/19 0025 09/30/19 0025 09/30/19 0435 10/03/19 1539 01/26/20 1356  AST 26   < > 23 34 26  ALT 16   < > 13 15 15   ALKPHOS 77  --  66  --  83  BILITOT 0.5   < > 0.6 0.3 0.8  PROT 7.6   < > 7.1 7.1 8.5*  ALBUMIN 2.5*  --  2.3*  --  2.7*   < > = values in this interval not displayed.   Recent Labs    06/26/19 1352  LIPASE 38   Recent Labs    07/13/19 1600  AMMONIA 16   CBC: Recent Labs    10/03/19 1539 11/07/19 1449 01/26/20 1356  WBC 5.3 5.8 6.7  NEUTROABS 3,599 3.8 5.2  HGB 11.1* 9.8* 10.3*  HCT 34.0* 29.0* 32.5*  MCV 77.3* 76.3* 75.4*  PLT 368 294.0 249   Lipid Panel: Recent Labs    07/15/19 0430 07/21/19 2333 07/22/19 0828  TRIG 77 66 75   Lab Results  Component Value Date   HGBA1C 6.3 (H) 06/27/2019    Assessment/Plan 1. Hypokalemia -due to lasix and chronic diarrhea -cont her low dose lasix b/c she has not tolerated going w/o it before--on just 10mg , switch to liquid potassium (warned about taste) - potassium chloride 20 MEQ/15ML (10%) SOLN; Take 15 mLs (20 mEq total) by mouth daily.  Dispense: 473 mL; Refill: 0  2. Loss of appetite -ongoing, discouraged appetite stimulants at this stage of her illness considering her personal goals are comfort-based and she is expressing her QOL is no longer what it was, plus they did not have any long-term benefit for her  3. Failure to thrive in adult -progressive, unclear if she may have an atypical pneumonia process that has never cleared, await AFB sputum result  4. Late onset Alzheimer's disease  without behavioral disturbance ([place]) -is progressing with failure to thrive, now on hospice care--gave prognosis of weeks to months with this degree of poor intake, recommended having hospital bed to help with taking pressure off pressure points with her cachexia and to help with mobility and dyspnea  5. Generalized weakness -progressive with poor intake  6. Dysphagia, oropharyngeal -ongoing, her daughter asked if switching to puree would help her eat more but patient outright said she would not eat that  7. Low TSH level - needs to be rechecked--could not get at home so will draw today--TSH  8. Need for influenza vaccination - Flu Vaccine QUAD High Dose(Fluad)  9. Atypical pneumonia -await sputum result--?TB or similar  Labs/tests ordered:   Lab Orders     TSH  Next appt:  03/19/2020  Dalin Caldera L. Martez Weiand, D.O. [facility] [address], [place] [address] Cell Phone (Mon-Fri 8am-5pm):  [phone] On Call:  [phone] & follow prompts after 5pm & weekends Office Phone:  [phone] Office Fax:  [phone]

## 2020-02-08 LAB — TEST AUTHORIZATION

## 2020-02-08 LAB — TSH: TSH: <0.01 mIU/L — ABNORMAL LOW (ref 0.40–4.50)

## 2020-02-08 LAB — T3, FREE: T3, Free: 9.4 pg/mL — ABNORMAL HIGH (ref 2.3–4.2)

## 2020-02-08 LAB — T4, FREE: Free T4: 3.1 ng/dL — ABNORMAL HIGH (ref 0.8–1.8)

## 2020-02-10 ENCOUNTER — Other Ambulatory Visit: Payer: Self-pay | Admitting: Internal Medicine

## 2020-02-10 ENCOUNTER — Telehealth: Payer: Self-pay

## 2020-02-10 MED ORDER — METHIMAZOLE 5 MG PO TABS: 5.0000 mg | ORAL_TABLET | Freq: Every day | ORAL | 0 refills | Status: AC

## 2020-02-10 NOTE — Progress Notes (Signed)
Thyroid tests have returned consistent with persistent hyperthyroidism.  The values are at a point where we can try methimazole 5mg  daily to help decrease the thyroid hormone her gland is producing.  This may make her feel better, but may not have much impact considering the other challenges she is facing.  (sent in [place])

## 2020-02-10 NOTE — Telephone Encounter (Signed)
Discussed with patient's husband who states the daughter and him would like to do what is recommended. Rx sent to [facility].

## 2020-02-10 NOTE — Telephone Encounter (Signed)
-----   Message from Gayland Curry, DO sent at 02/10/2020  5:49 AM EDT ----- Thyroid tests have returned consistent with persistent hyperthyroidism.  The values are at a point where we can try methimazole 5mg  daily to help decrease the thyroid hormone her gland is producing.  This may make her feel better, but may not have much impact considering the other challenges she is facing.  (sent in [place])

## 2020-02-20 ENCOUNTER — Ambulatory Visit: Payer: Medicare Other | Admitting: Internal Medicine

## 2020-02-21 ENCOUNTER — Encounter: Payer: Self-pay | Admitting: Internal Medicine

## 2020-02-21 ENCOUNTER — Telehealth: Payer: Self-pay

## 2020-02-21 DIAGNOSIS — Z515 Encounter for palliative care: Secondary | ICD-10-CM

## 2020-02-21 MED ORDER — MORPHINE SULFATE (CONCENTRATE) 20 MG/ML PO SOLN: 5.0000 mg | ORAL | 0 refills | Status: AC | PRN

## 2020-02-21 NOTE — Telephone Encounter (Signed)
It may have worked now.  Seemed to.

## 2020-02-21 NOTE — Telephone Encounter (Signed)
Otila Kluver from [facility] [phone]) stated that patient is showing signs of transitioning and is asking for morphine 20 mg/L 0.25 mL qh prn.  I have pended an order, but [facility] is down. I told Otila Kluver this also.

## 2020-02-22 ENCOUNTER — Encounter: Payer: Self-pay | Admitting: Internal Medicine

## 2020-02-26 ENCOUNTER — Encounter: Payer: Self-pay | Admitting: Internal Medicine

## 2020-03-02 ENCOUNTER — Other Ambulatory Visit: Payer: Medicare Other | Admitting: Nurse Practitioner

## 2020-03-09 DEATH — deceased

## 2020-03-17 LAB — AFB CULTURE WITH SMEAR (NOT AT ARMC)
Acid Fast Culture: NEGATIVE
Acid Fast Smear: NEGATIVE

## 2020-03-19 ENCOUNTER — Ambulatory Visit: Payer: Medicare Other | Admitting: Internal Medicine

## 2020-04-10 NOTE — Telephone Encounter (Signed)
Error

## 2022-02-02 IMAGING — CT CT RENAL STONE PROTOCOL
2 of 4 series · 15 of 46 positions shown, 17 images · non-contrast
Comparison: 07/27/2017

CLINICAL DATA: Flank pain, nausea vomiting history of ovarian
carcinoid tumor.

EXAM:
CT ABDOMEN AND PELVIS WITHOUT CONTRAST
TECHNIQUE: Multidetector CT imaging of the abdomen and pelvis was performed
following the standard protocol without IV contrast.

[Series 2: axial st · axial · 0.79mm/px · z∈[-513,-68]mm · 12 of 101 slices shown, 14 images]
[im 6/101  soft-tissue]
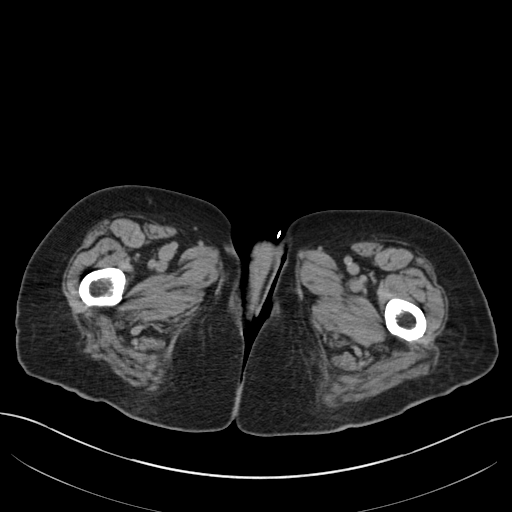
[im 6/101  bone]
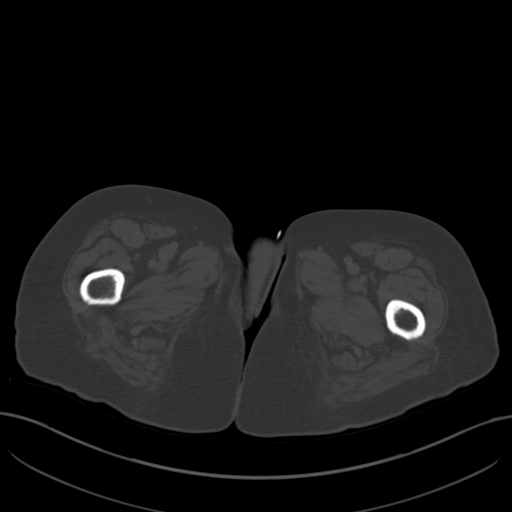
[im 16/101  soft-tissue]
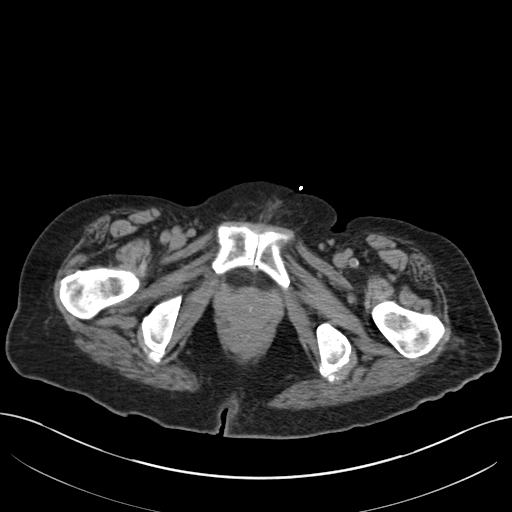
[im 22/101  soft-tissue]
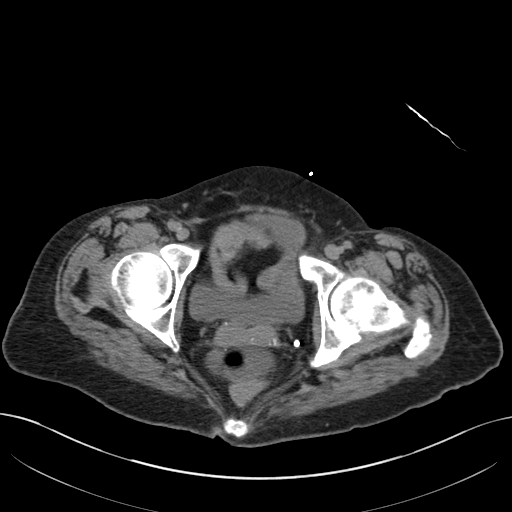
[im 32/101  soft-tissue]
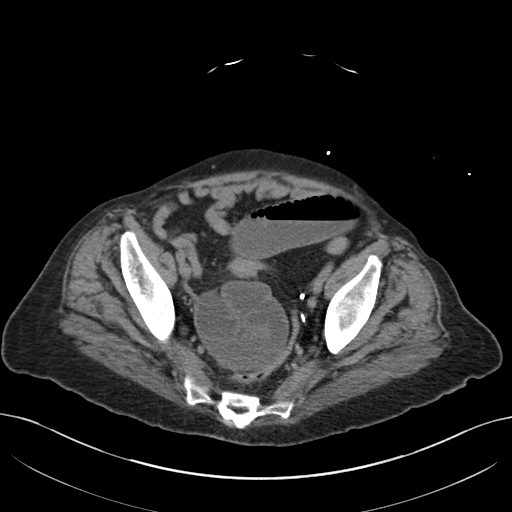
[im 37/101  soft-tissue]
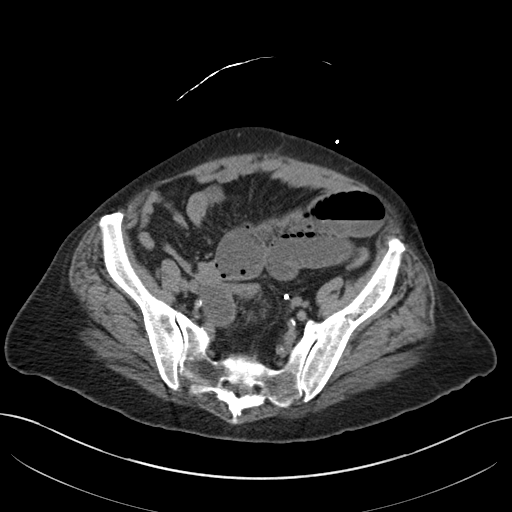
[im 48/101  soft-tissue]
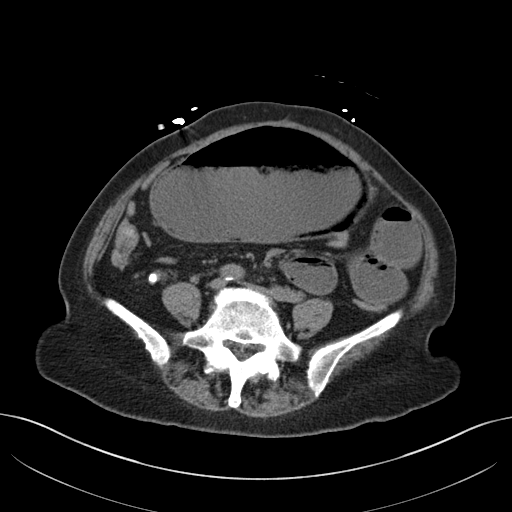
[im 53/101  soft-tissue]
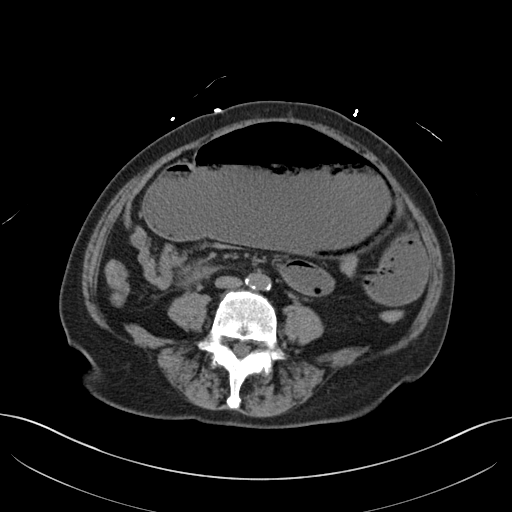
[im 64/101  soft-tissue]
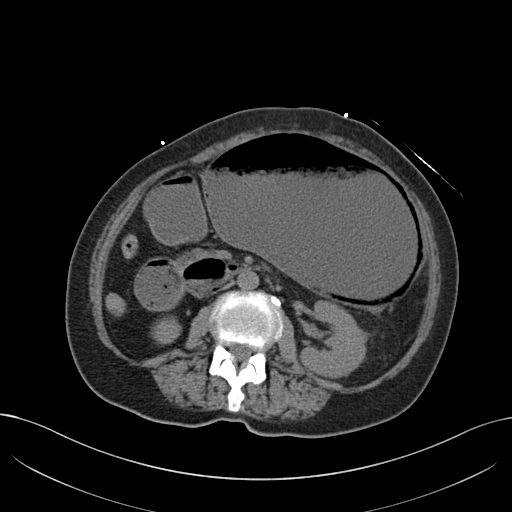
[im 69/101  soft-tissue]
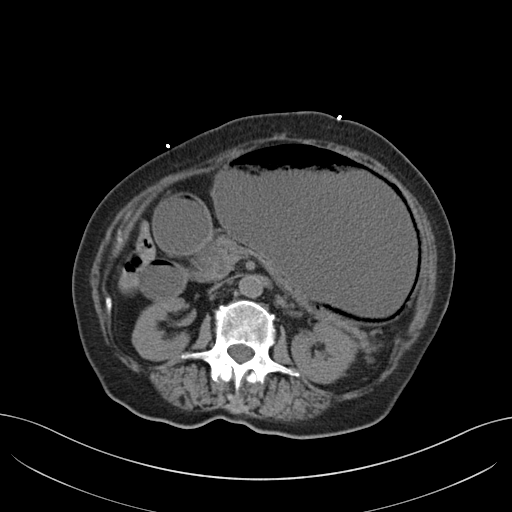
[im 69/101  bone]
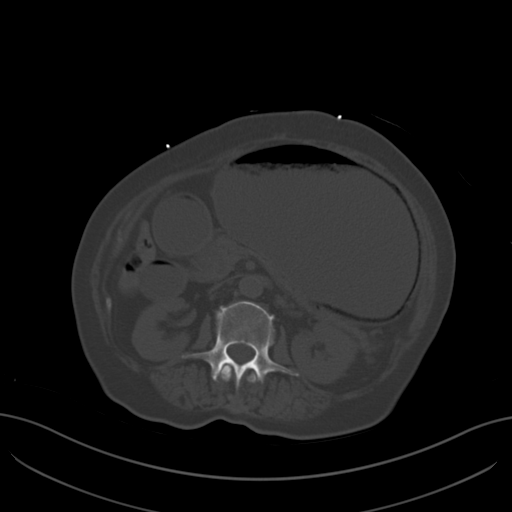
[im 79/101  soft-tissue]
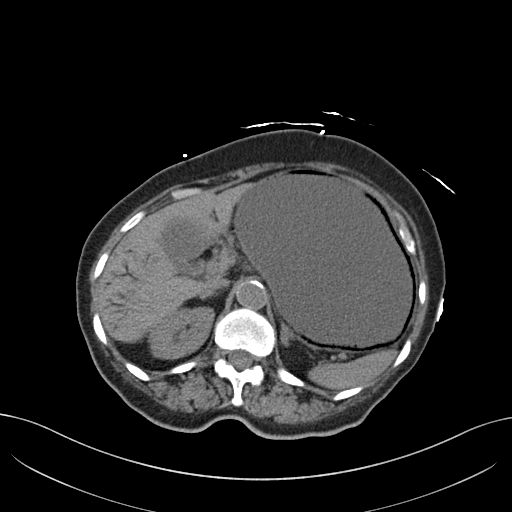
[im 85/101  soft-tissue]
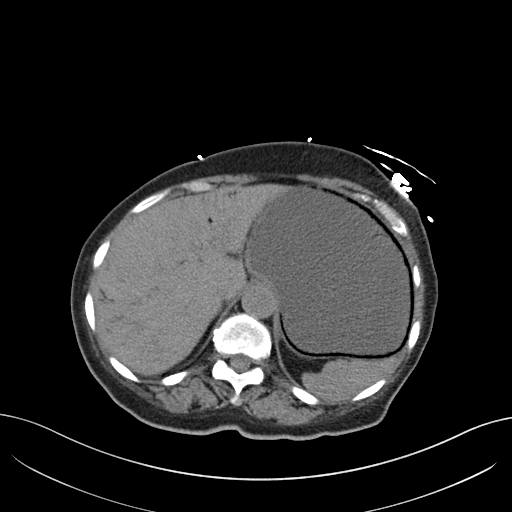
[im 95/101  soft-tissue]
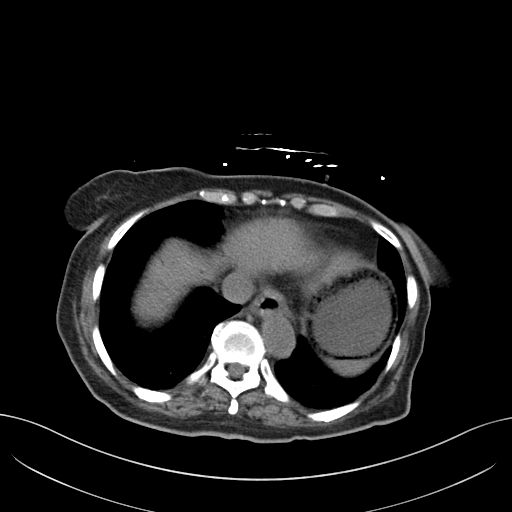

[Series 4: coronal · coronal · 0.85mm/px · 3 of 137 slices shown]
[im 46/137  soft-tissue]
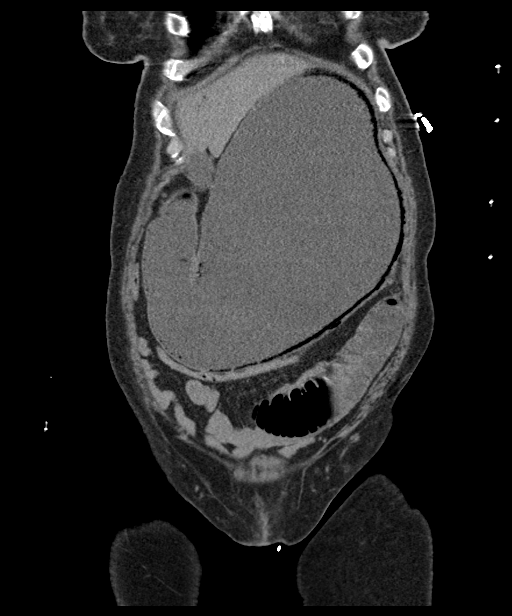
[im 61/137  soft-tissue]
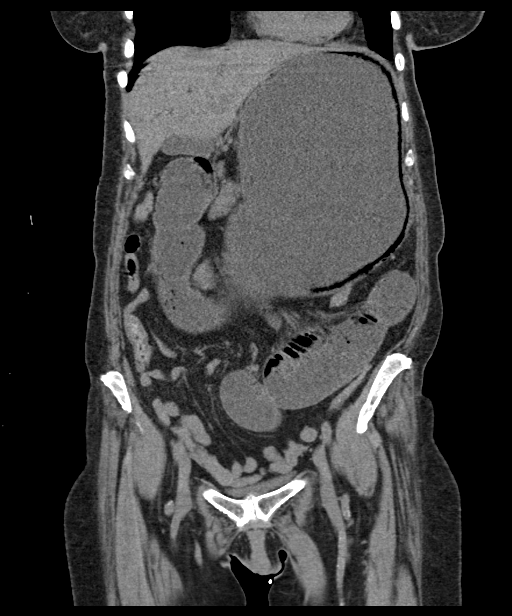
[im 76/137  soft-tissue]
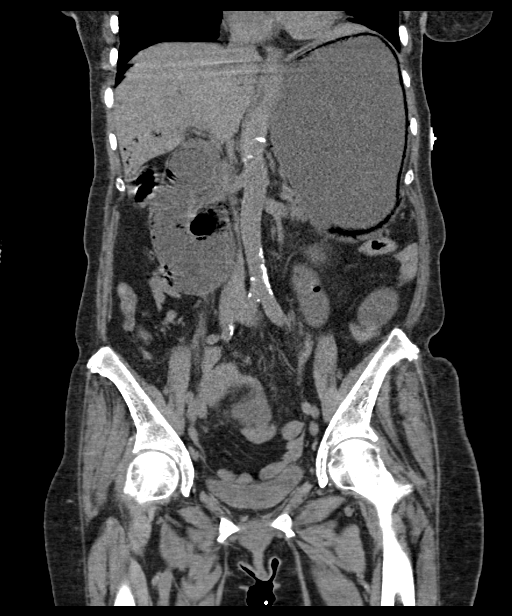

[15 of 46 positions shown; findings below may reference images not displayed]

FINDINGS: Lower chest: Basilar atelectasis though with nodular changes at the
left lung base. These are ill-defined. No dense consolidation or
effusion.

Hepatobiliary: Signs of portal venous gas. Limited assessment of the
liver on noncontrast imaging. No signs of biliary ductal dilation.

Pancreas: Pancreas displaced by markedly distended stomach. No signs
of focal pancreatic lesion.

Spleen: Spleen is normal.

Adrenals/Urinary Tract: Adrenal glands are normal. No signs of
hydronephrosis or nephrolithiasis.

Stomach/Bowel: Gastric distension and small bowel dilation
terminating in the distal jejunum in the right lower quadrant
anterior to the iliac vasculature. Distal small bowel loops are
decompressed. Adjacent to the appendix is a mesenteric nodule with
calcification measuring approximately 12 mm. There is mild
distension of the distal ileum. Question of filling defect within
the distal ileum with 53 Hounsfield unit density within the terminal
ileum at this location. The appendix is normal.

Signs of gastric pneumatosis likely the source of portal venous gas.
Gas in the wall of the stomach is best seen on image 51 of series 2.
There is gas surrounding intraluminal contents of the stomach in
other areas potentially intermixed with gastric pneumatosis as well,
for instance on image 35 along the lateral stomach. Gas within the
extrahepatic portal system is not seen.

Vascular/Lymphatic: Calcified atherosclerotic changes throughout the
abdominal aorta.

No signs of adenopathy in the retroperitoneum. Presumed mesenteric
mass or adenopathy in the right lower quadrant (image 53, series 2)
this is measured above.

Reproductive: Post hysterectomy. No sign of pelvic mass with
transition point for small-bowel obstruction in the pelvis.

Other: No signs of free air at this time. Small amount of fluid in
the pelvis.

Musculoskeletal: No signs of acute bone finding or destructive bone
process.
IMPRESSION: 1. Small-bowel obstruction with marked gastric distension, moderate
to marked small bowel distension, associated with gastric
pneumatosis and portal venous gas. Surgical consultation is
suggested.
2. Constellation of findings likely related to adhesions in the
pelvis given appearance of small bowel loops.
3. Calcified mesenteric lesion and right lower quadrant likely
primary ileal carcinoid tumor. Subtle filling defect with soft
tissue density suggested on today's evaluation.
4. Basilar subtle nodularity may represent early aspiration
pneumonitis. Attention on follow-up.

These results were called by telephone at the time of interpretation
on 06/26/2019 at [DATE] to provider YIBING INTL and Dr. Emme, Who
verbally acknowledged these results.

## 2022-02-05 IMAGING — DX DG ABDOMEN 1V
1 series · 1 of 1 positions shown · non-contrast
Comparison: 06/29/2019 at [DATE] a.m.

CLINICAL DATA: NG tube placement.

EXAM:
ABDOMEN - 1 VIEW

[abdomen kub]
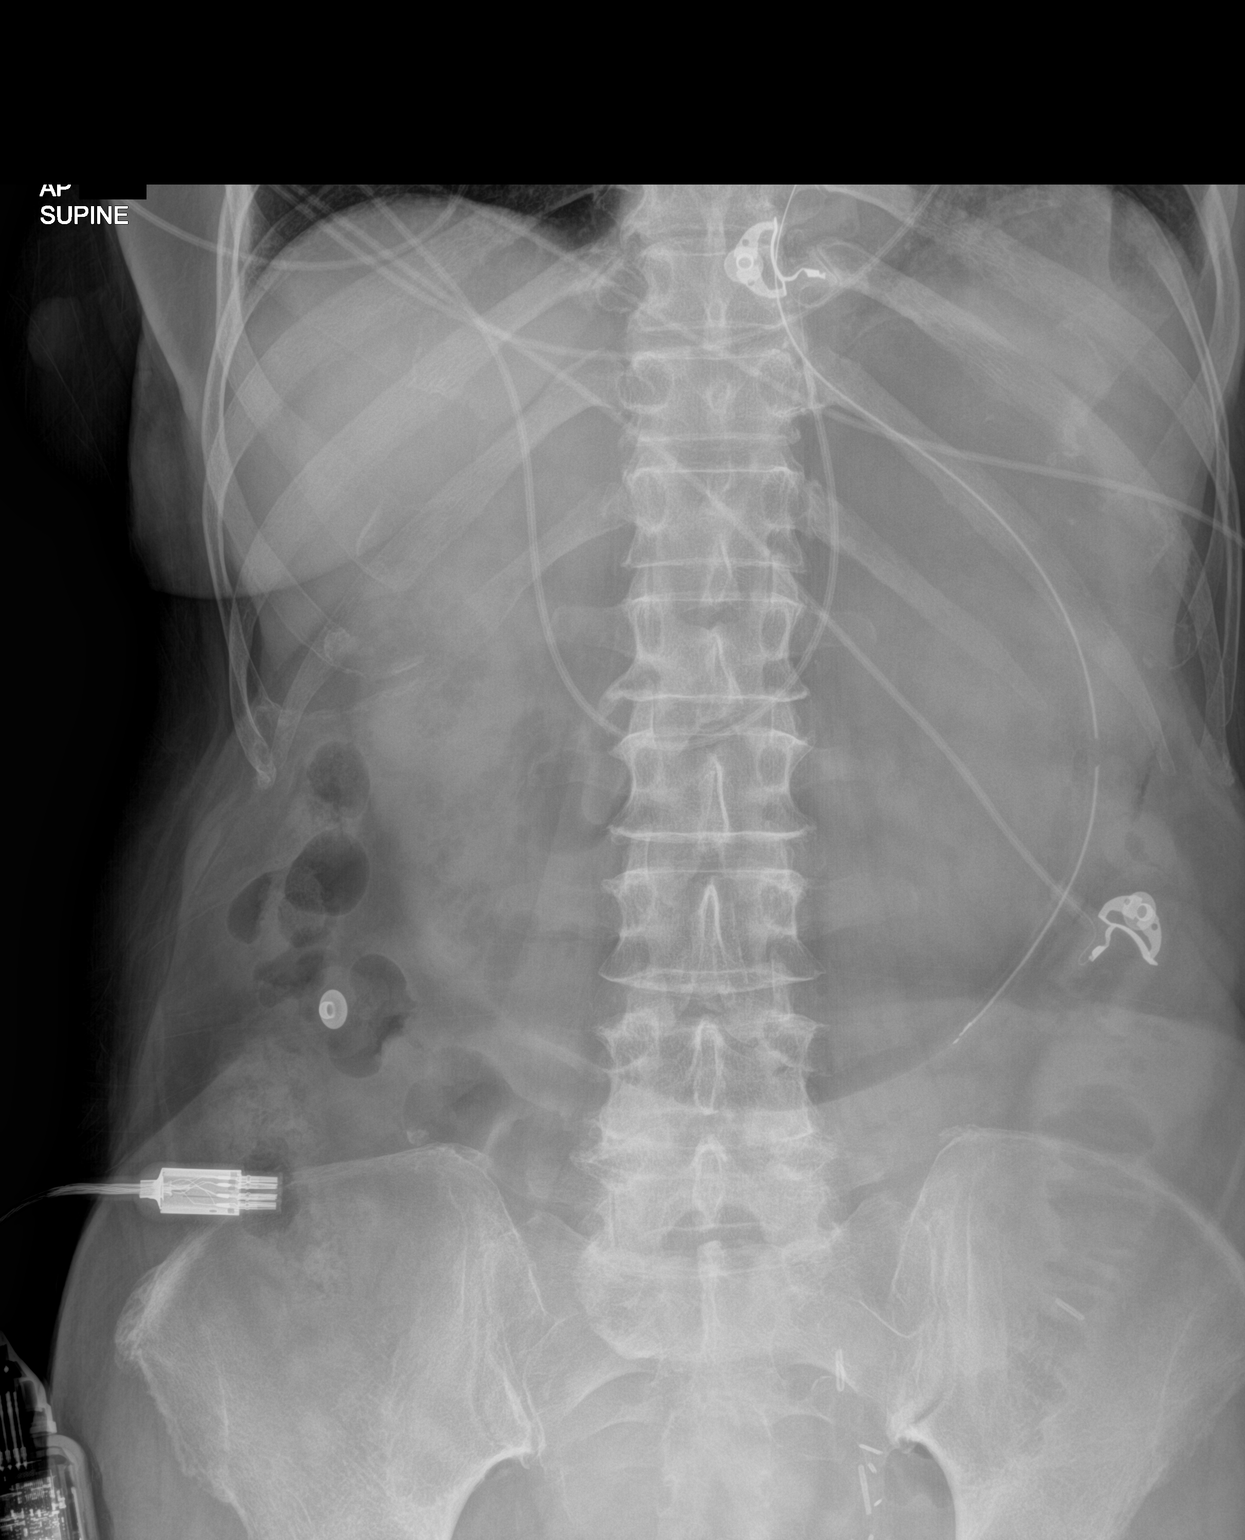

[1 of 1 positions shown; findings below may reference images not displayed]

FINDINGS: Interval placement of nasogastric tube with tip over the stomach in
the left mid abdomen. Bowel gas pattern is nonobstructive. No free
peritoneal air. Multiple surgical clips over the left lower
quadrant. Remainder of the exam is unchanged.
IMPRESSION: Nasogastric tube with tip over the stomach in the left mid abdomen.

Nonobstructive bowel gas pattern.
